# Patient Record
Sex: Male | Born: 1950 | Race: Black or African American | Hispanic: No
Health system: Southern US, Community
[De-identification: ages and names within clinical notes are randomized; demographics above are authoritative.]

## PROBLEM LIST (undated history)

## (undated) DIAGNOSIS — I639 Cerebral infarction, unspecified: Secondary | ICD-10-CM

## (undated) DIAGNOSIS — N179 Acute kidney failure, unspecified: Secondary | ICD-10-CM

## (undated) DIAGNOSIS — I10 Essential (primary) hypertension: Secondary | ICD-10-CM

## (undated) DIAGNOSIS — E785 Hyperlipidemia, unspecified: Secondary | ICD-10-CM

## (undated) DIAGNOSIS — N182 Chronic kidney disease, stage 2 (mild): Secondary | ICD-10-CM

## (undated) DIAGNOSIS — I739 Peripheral vascular disease, unspecified: Secondary | ICD-10-CM

---

## 1998-05-06 ENCOUNTER — Encounter: Admission: RE | Admit: 1998-05-06 | Discharge: 1998-08-04 | Payer: Self-pay | Admitting: Neurosurgery

## 1998-09-09 ENCOUNTER — Encounter: Payer: Self-pay | Admitting: Neurosurgery

## 1998-09-10 ENCOUNTER — Ambulatory Visit (HOSPITAL_COMMUNITY): Admission: RE | Admit: 1998-09-10 | Discharge: 1998-09-10 | Payer: Self-pay | Admitting: Neurosurgery

## 2001-11-05 ENCOUNTER — Encounter: Payer: Self-pay | Admitting: Emergency Medicine

## 2001-11-05 ENCOUNTER — Emergency Department (HOSPITAL_COMMUNITY): Admission: EM | Admit: 2001-11-05 | Discharge: 2001-11-05 | Payer: Self-pay | Admitting: Emergency Medicine

## 2004-09-05 ENCOUNTER — Ambulatory Visit: Payer: Self-pay | Admitting: Nurse Practitioner

## 2004-09-25 ENCOUNTER — Ambulatory Visit: Payer: Self-pay | Admitting: *Deleted

## 2005-09-08 ENCOUNTER — Ambulatory Visit (HOSPITAL_BASED_OUTPATIENT_CLINIC_OR_DEPARTMENT_OTHER): Admission: RE | Admit: 2005-09-08 | Discharge: 2005-09-08 | Payer: Self-pay | Admitting: Internal Medicine

## 2005-09-13 ENCOUNTER — Ambulatory Visit: Payer: Self-pay | Admitting: Internal Medicine

## 2009-04-15 ENCOUNTER — Emergency Department (HOSPITAL_COMMUNITY): Admission: EM | Admit: 2009-04-15 | Discharge: 2009-04-15 | Payer: Self-pay | Admitting: Emergency Medicine

## 2010-04-23 ENCOUNTER — Emergency Department (HOSPITAL_COMMUNITY): Admission: EM | Admit: 2010-04-23 | Discharge: 2010-04-23 | Payer: Self-pay | Admitting: Emergency Medicine

## 2010-04-23 ENCOUNTER — Emergency Department (HOSPITAL_COMMUNITY): Admission: EM | Admit: 2010-04-23 | Discharge: 2010-04-23 | Payer: Self-pay | Admitting: Family Medicine

## 2011-03-10 LAB — POCT I-STAT, CHEM 8
BUN: 30 mg/dL — ABNORMAL HIGH (ref 6–23)
Calcium, Ion: 1.07 mmol/L — ABNORMAL LOW (ref 1.12–1.32)
Chloride: 103 mEq/L (ref 96–112)
Creatinine, Ser: 1.6 mg/dL — ABNORMAL HIGH (ref 0.4–1.5)
Glucose, Bld: 142 mg/dL — ABNORMAL HIGH (ref 70–99)
HCT: 43 % (ref 39.0–52.0)
Hemoglobin: 14.6 g/dL (ref 13.0–17.0)
Potassium: 4.3 mEq/L (ref 3.5–5.1)
Sodium: 138 mEq/L (ref 135–145)
TCO2: 34 mmol/L (ref 0–100)

## 2011-03-10 LAB — POCT URINALYSIS DIP (DEVICE)
Bilirubin Urine: NEGATIVE
Glucose, UA: NEGATIVE mg/dL
Ketones, ur: NEGATIVE mg/dL
Nitrite: POSITIVE — AB
Protein, ur: 100 mg/dL — AB
Specific Gravity, Urine: 1.015 (ref 1.005–1.030)
Urobilinogen, UA: 0.2 mg/dL (ref 0.0–1.0)
pH: 6 (ref 5.0–8.0)

## 2011-05-08 NOTE — Procedures (Signed)
NAME:  CALOB, BASKETTE                ACCOUNT NO.:  [id number]   MEDICAL RECORD NO.:  [id number]          PATIENT TYPE:  OUT   LOCATION:  SLEEP CENTER                 FACILITY:  [facility]   PHYSICIAN:  Clinton D. Maple Hudson, M.D. DATE OF BIRTH:  11/06/1951   DATE OF STUDY:  09/08/2005                              NOCTURNAL POLYSOMNOGRAM   REFERRING PHYSICIAN:  Dr. Fleet Contras.   DATE OF STUDY:  September 08, 2005.   INDICATION FOR STUDY:  Hypersomnia with sleep apnea. Epworth sleepiness  score 14/24, BMI 39. Weight 230 pounds.   SLEEP ARCHITECTURE:  Total sleep time 412 minutes with sleep efficiency 90%.  Stage I was 15%, stage II 37%, stages III and IV 24%, REM 25% of total sleep  time. Sleep latency 4 minutes, REM latency 127 minutes, awake after sleep  onset 44 minutes, arousal index 40. No bedtime medication taken.   RESPIRATORY DATA:  Split study protocol. Apnea/hypopnea index (AHI, RDI)  94.8 obstructive events per hour indicating severe obstructive sleep  apnea/hypopnea syndrome. There were 202 obstructive apneas and 6 hypopneas  before CPAP. Events were not positional. REM AHI 34. The technician titrated  CPAP to 25 CWP, AHI 18 per hour. This represents a complete control even at  that pressure. Somewhat better scores were obtained during short intervals  of time at CPAP 11 to 13 raising the possibility that at higher pressures  the patient was developing nasal congestion on a vasomotor basis to the CPAP  flow. Mask type: ResMed Swift with medium nasal pillows and heated  humidifier.   OXYGEN DATA:  Very loud snoring with oxygen desaturation to a nadir of 50%.  Mean oxygen saturation after CPAP control was 92-95% on room air.   CARDIAC DATA:  Normal sinus rhythm.   MOVEMENT/PARASOMNIA:  Leg jerks were reported with little effect on sleep.   IMPRESSION/RECOMMENDATIONS:  1.  Severe obstructive sleep apnea/hypopnea syndrome, apnea/hypopnea index      94.8 per hour with loud  snoring and oxygen desaturation to 50% on room      air.  2.  Improved scores but with residual events, apnea/hypopnea index 18 per      hour, even at 25 centimeter of water pressure. Note consideration above      that higher pressures might be counterproductive. Consider initial trial      at 13 centimeter of water pressure (AHI 15.4 per hour). If this does not      provide satisfactory control, may need to tried BiPAP to tolerate higher      pressures. If bilevel therapy is needed, consider initial trial with an      inspiratory pressure of 25 and expiratory pressure of 15. Recommend      heated humidifier. Also consider ear, nose, and      throat evaluation for correctable upper airway obstructive conditions or      manage allergies/nasal congestion medically. Weight loss is likely to      help.      Clinton D. Maple Hudson, M.D.  Diplomate, Biomedical engineer of Sleep Medicine  Electronically Signed     CDY/MEDQ  D:  09/13/2005 14:20:38  T:  09/13/2005 23:51:21  Job:  [id number]

## 2014-03-27 ENCOUNTER — Ambulatory Visit (HOSPITAL_BASED_OUTPATIENT_CLINIC_OR_DEPARTMENT_OTHER): Payer: Medicare PPO | Attending: Internal Medicine | Admitting: Radiology

## 2014-03-27 VITALS — Ht 65.0 in | Wt 235.0 lb

## 2014-03-27 DIAGNOSIS — G4733 Obstructive sleep apnea (adult) (pediatric): Secondary | ICD-10-CM | POA: Diagnosis not present

## 2014-03-31 DIAGNOSIS — G4733 Obstructive sleep apnea (adult) (pediatric): Secondary | ICD-10-CM

## 2014-03-31 DIAGNOSIS — G4710 Hypersomnia, unspecified: Secondary | ICD-10-CM

## 2014-03-31 DIAGNOSIS — G4730 Sleep apnea, unspecified: Secondary | ICD-10-CM

## 2014-03-31 NOTE — Sleep Study (Signed)
   NAME: Gregory [name] DATE OF BIRTH:  1951-03-31 MEDICAL RECORD NUMBER [id number]  LOCATION: [facility]  PHYSICIAN: Florencio Hollibaugh D Kaeli Nichelson  DATE OF STUDY: 03/27/2014  SLEEP STUDY TYPE: Nocturnal Polysomnogram               REFERRING PHYSICIAN: Dorrene GermanAvbuere, Edwin A, MD  INDICATION FOR STUDY: Hypersomnia with sleep apnea  EPWORTH SLEEPINESS SCORE:   12/24 HEIGHT: 5\' 5"  (165.1 cm)  WEIGHT: 235 lb (106.595 kg)    Body mass index is 39.11 kg/(m^2).  NECK SIZE: 23 in.  MEDICATIONS: Charted for review  SLEEP ARCHITECTURE: Split study protocol. During the diagnostic phase, total sleep time 134.5 minutes with sleep efficiency 94.1%. Stage I was 7.1%, stage II 71.9%, stage III absent, REM 20.9% of total sleep time. Sleep latency 4 minutes, REM latency 45.5 minutes, awake after sleep onset 4 minutes, arousal index 82.5. Bedtime medication: None.  RESPIRATORY DATA: Apnea hypopnea index (AHI) 96.3 per hour. 203 total events scored including 144 obstructive apneas, 2 central apneas, 43 mixed apneas, 14 hypopneas. Nonsupine events. REM AHI 72.5 per hour. CPAP was titrated to 21 CWP with AHI 0 per hour. He was still snoring so the technician changed to bilevel and titrated to an inspiratory pressure of 25 and expiratory pressure of 21 CWP. The patient was still having significant numbers of events at that level with a residual AHI of 86.7 per hour. He wore a medium ResMed Quattro fracture  face mask with heated humidifier.  OXYGEN DATA: Moderately loud snoring before CPAP with oxygen desaturation to a nadir of 55% on room air. Because of persistent desaturation into the 60% range, the technician added oxygen at 1 L per minute at 1: 11 AM. Mean oxygen saturation during CPAP titration, with supplemental oxygen, was 86.9%.  CARDIAC DATA: Sinus rhythm  MOVEMENT/PARASOMNIA: No significant movement disturbance, bathroom x2  IMPRESSION/ RECOMMENDATION:   1) Severe obstructive sleep  apnea/hypopnea syndrome, AHI 96.3 per hour with nonsupine events. REM AHI 72.5 per hour. Moderately loud snoring with oxygen desaturation to a nadir of 55% on room air.  2) CPAP was titrated to 21 CWP with residual AHI 0 per hour. Because of persistent snoring, the technician changed to bilevel and titrated to an inspiratory pressure of 25 and expiratory pressure of 21 CWP. This was associated with a residual AHI of 86.7 per hour. Recommend initial home trial with CPAP at 20 CWP. The patient wore a medium ResMed Quattro fracture fullface mask with heated humidifier. 3) Because of persistent low oxygen saturations, the technician added 1 L of supplemental oxygen at 1: 11 AM. Recommend evaluation for supplemental oxygen during sleep in the home environment. 4) Consider ENT evaluation for correctable nasal or upper airway obstruction contributing to the severe obstructive sleep apnea. Difficulty controlling apnea events with these therapeutic pressures is unusual.  Signed Jetty Duhamellinton Clairessa Boulet M.D. Waymon Budgelinton D Zarra Geffert Diplomate, Biomedical engineerAmerican Board of Sleep Medicine  ELECTRONICALLY SIGNED ON:  03/31/2014, 4:23 PM [facility] PH: [phone]   FX: [phone] ACCREDITED BY THE AMERICAN ACADEMY OF SLEEP MEDICINE

## 2015-01-18 ENCOUNTER — Telehealth: Payer: Self-pay | Admitting: *Deleted

## 2015-01-18 NOTE — Telephone Encounter (Signed)
error 

## 2018-09-03 ENCOUNTER — Encounter (HOSPITAL_COMMUNITY): Payer: Self-pay | Admitting: Radiology

## 2018-09-03 ENCOUNTER — Emergency Department (HOSPITAL_COMMUNITY): Payer: Medicare HMO

## 2018-09-03 ENCOUNTER — Observation Stay (HOSPITAL_COMMUNITY)
Admission: EM | Admit: 2018-09-03 | Discharge: 2018-09-05 | Disposition: A | Discharge status: Home / Self Care | Payer: Medicare HMO | Attending: Student in an Organized Health Care Education/Training Program | Admitting: Student in an Organized Health Care Education/Training Program

## 2018-09-03 ENCOUNTER — Observation Stay (HOSPITAL_COMMUNITY): Payer: Medicare HMO

## 2018-09-03 DIAGNOSIS — R4701 Aphasia: Secondary | ICD-10-CM | POA: Diagnosis present

## 2018-09-03 DIAGNOSIS — G459 Transient cerebral ischemic attack, unspecified: Secondary | ICD-10-CM | POA: Diagnosis not present

## 2018-09-03 DIAGNOSIS — N179 Acute kidney failure, unspecified: Secondary | ICD-10-CM | POA: Diagnosis not present

## 2018-09-03 DIAGNOSIS — I160 Hypertensive urgency: Secondary | ICD-10-CM

## 2018-09-03 DIAGNOSIS — N189 Chronic kidney disease, unspecified: Secondary | ICD-10-CM | POA: Insufficient documentation

## 2018-09-03 DIAGNOSIS — I6523 Occlusion and stenosis of bilateral carotid arteries: Secondary | ICD-10-CM | POA: Diagnosis present

## 2018-09-03 DIAGNOSIS — I129 Hypertensive chronic kidney disease with stage 1 through stage 4 chronic kidney disease, or unspecified chronic kidney disease: Secondary | ICD-10-CM | POA: Insufficient documentation

## 2018-09-03 DIAGNOSIS — R7303 Prediabetes: Secondary | ICD-10-CM | POA: Diagnosis present

## 2018-09-03 DIAGNOSIS — I10 Essential (primary) hypertension: Secondary | ICD-10-CM | POA: Diagnosis present

## 2018-09-03 DIAGNOSIS — N183 Chronic kidney disease, stage 3 unspecified: Secondary | ICD-10-CM | POA: Diagnosis present

## 2018-09-03 LAB — COMPREHENSIVE METABOLIC PANEL
ALBUMIN: 3.6 g/dL (ref 3.5–5.0)
ALT: 15 U/L (ref 0–44)
AST: 26 U/L (ref 15–41)
Alkaline Phosphatase: 95 U/L (ref 38–126)
Anion gap: 12 (ref 5–15)
BUN: 24 mg/dL — AB (ref 8–23)
CO2: 24 mmol/L (ref 22–32)
CREATININE: 1.63 mg/dL — AB (ref 0.61–1.24)
Calcium: 9.2 mg/dL (ref 8.9–10.3)
Chloride: 100 mmol/L (ref 98–111)
GFR calc Af Amer: 48 mL/min — ABNORMAL LOW (ref >60)
GFR calc non Af Amer: 41 mL/min — ABNORMAL LOW (ref >60)
Glucose, Bld: 131 mg/dL — ABNORMAL HIGH (ref 70–99)
POTASSIUM: 5.2 mmol/L — AB (ref 3.5–5.1)
SODIUM: 136 mmol/L (ref 135–145)
Total Bilirubin: 0.7 mg/dL (ref 0.3–1.2)
Total Protein: 7.6 g/dL (ref 6.5–8.1)

## 2018-09-03 LAB — DIFFERENTIAL
ABS IMMATURE GRANULOCYTES: 0 10*3/uL (ref 0.0–0.1)
BASOS ABS: 0.1 10*3/uL (ref 0.0–0.1)
BASOS PCT: 1 %
EOS ABS: 0.2 10*3/uL (ref 0.0–0.7)
Eosinophils Relative: 2 %
IMMATURE GRANULOCYTES: 0 %
Lymphocytes Relative: 27 %
Lymphs Abs: 2.1 10*3/uL (ref 0.7–4.0)
Monocytes Absolute: 0.6 10*3/uL (ref 0.1–1.0)
Monocytes Relative: 8 %
NEUTROS ABS: 4.8 10*3/uL (ref 1.7–7.7)
Neutrophils Relative %: 62 %

## 2018-09-03 LAB — I-STAT CHEM 8, ED
BUN: 28 mg/dL — AB (ref 8–23)
Calcium, Ion: 1.16 mmol/L (ref 1.15–1.40)
Chloride: 105 mmol/L (ref 98–111)
Creatinine, Ser: 1.7 mg/dL — ABNORMAL HIGH (ref 0.61–1.24)
Glucose, Bld: 126 mg/dL — ABNORMAL HIGH (ref 70–99)
HEMATOCRIT: 47 % (ref 39.0–52.0)
HEMOGLOBIN: 16 g/dL (ref 13.0–17.0)
POTASSIUM: 4.7 mmol/L (ref 3.5–5.1)
SODIUM: 139 mmol/L (ref 135–145)
TCO2: 26 mmol/L (ref 22–32)

## 2018-09-03 LAB — I-STAT TROPONIN, ED: Troponin i, poc: 0.04 ng/mL (ref 0.00–0.08)

## 2018-09-03 LAB — CBC
HCT: 47.7 % (ref 39.0–52.0)
HEMOGLOBIN: 14 g/dL (ref 13.0–17.0)
MCH: 28.6 pg (ref 26.0–34.0)
MCHC: 29.4 g/dL — ABNORMAL LOW (ref 30.0–36.0)
MCV: 97.5 fL (ref 78.0–100.0)
Platelets: 200 10*3/uL (ref 150–400)
RBC: 4.89 MIL/uL (ref 4.22–5.81)
RDW: 13.8 % (ref 11.5–15.5)
WBC: 7.8 10*3/uL (ref 4.0–10.5)

## 2018-09-03 LAB — APTT: APTT: 30 s (ref 24–36)

## 2018-09-03 LAB — PROTIME-INR
INR: 1
Prothrombin Time: 13.1 seconds (ref 11.4–15.2)

## 2018-09-03 LAB — CBG MONITORING, ED: Glucose-Capillary: 99 mg/dL (ref 70–99)

## 2018-09-03 MED ORDER — ASPIRIN 81 MG PO CHEW: 324.0000 mg | CHEWABLE_TABLET | Freq: Once | ORAL | Status: DC

## 2018-09-03 MED ORDER — IOPAMIDOL (ISOVUE-370) INJECTION 76%
INTRAVENOUS | Status: AC
  Filled 2018-09-03: qty 50

## 2018-09-03 MED ORDER — ACETAMINOPHEN 325 MG PO TABS: 650.0000 mg | ORAL_TABLET | ORAL | Status: DC | PRN

## 2018-09-03 MED ORDER — SODIUM CHLORIDE 0.9 % IV SOLN
INTRAVENOUS | Status: DC
  Administered 2018-09-03: 22:00:00 via INTRAVENOUS

## 2018-09-03 MED ORDER — ASPIRIN 81 MG PO CHEW
324.0000 mg | CHEWABLE_TABLET | Freq: Once | ORAL | Status: AC
  Administered 2018-09-03: 324 mg via ORAL
  Filled 2018-09-03: qty 4

## 2018-09-03 MED ORDER — ENOXAPARIN SODIUM 40 MG/0.4ML ~~LOC~~ SOLN
40.0000 mg | SUBCUTANEOUS | Status: DC
  Administered 2018-09-04 – 2018-09-05 (×2): 40 mg via SUBCUTANEOUS
  Filled 2018-09-03 (×2): qty 0.4

## 2018-09-03 MED ORDER — IOPAMIDOL (ISOVUE-370) INJECTION 76%
50.0000 mL | Freq: Once | INTRAVENOUS | Status: AC | PRN
  Administered 2018-09-03: 50 mL via INTRAVENOUS

## 2018-09-03 MED ORDER — ATORVASTATIN CALCIUM 80 MG PO TABS
80.0000 mg | ORAL_TABLET | Freq: Every day | ORAL | Status: DC
  Administered 2018-09-03 – 2018-09-05 (×3): 80 mg via ORAL
  Filled 2018-09-03: qty 1
  Filled 2018-09-03: qty 4
  Filled 2018-09-03: qty 1

## 2018-09-03 MED ORDER — STROKE: EARLY STAGES OF RECOVERY BOOK
Freq: Once | Status: AC
  Administered 2018-09-03: 22:00:00
  Filled 2018-09-03: qty 1

## 2018-09-03 MED ORDER — SENNOSIDES-DOCUSATE SODIUM 8.6-50 MG PO TABS: 1.0000 | ORAL_TABLET | Freq: Every evening | ORAL | Status: DC | PRN

## 2018-09-03 MED ORDER — ACETAMINOPHEN 160 MG/5ML PO SOLN: 650.0000 mg | ORAL | Status: DC | PRN

## 2018-09-03 MED ORDER — ACETAMINOPHEN 650 MG RE SUPP: 650.0000 mg | RECTAL | Status: DC | PRN

## 2018-09-03 MED ORDER — LABETALOL HCL 5 MG/ML IV SOLN: 10.0000 mg | INTRAVENOUS | Status: DC | PRN

## 2018-09-03 NOTE — ED Notes (Signed)
Patient transported to MRI 

## 2018-09-03 NOTE — H&P (Signed)
Date: 09/03/2018               Patient Name:  Gregory [name] MRN: [id number]  DOB: 03/14/1951 Age / Sex: 67 y.o., male   PCP: System, Pcp Not In         Medical Service: Internal Medicine Teaching Service         Attending Physician: Dr. Sandre Kittyaines, Elwin MochaAlexander N, MD    First Contact: Dr. Cleaster CorinSeawell Pager: [phone]  Second Contact: Dr. Crista ElliotHarbrecht Pager: [phone]       After Hours (After 5p/  First Contact Pager: [phone]  weekends / holidays): Second Contact Pager: [phone]   Chief Complaint: Aphasia  History of Present Illness: [name] is a 67yo male with history of HTN, spinal stenosis, Gout, and OSA on home CPAP who presented to ED after experiencing sudden onset dizziness and difficulty speaking while at home with his children about three hours ago. He states he knew the words he wanted to say but was unable to get them out. He states symptoms lasted about 30 minutes before they began to resolve. He and his children state he is now at his baseline. He noted no other focal deficits such as weakness, numbness or tingling. He states this has never happened to him before. Patient denies fall or LOC.  Code stroke was called and CT scan of the head showed no acute finding. tPA was not given due to NIH stroke scale of 1. He denies recent illness, nausea, SOB, chest pain.   [place] [facility] and does not have his medication list. He knows he takes one blood pressure medication and a water pill but does not remember the name. Per neurology he also takes a blood thinner but does not remember the name. He is unsure if he takes medication for gout. His wife is trying to find the medication list at home.    In the ED blood pressure was 205/71, he was afebrile.   Meds:  No outpatient medications have been marked as taking for the 09/03/18 encounter [facility](Hospital Encounter).     Allergies: Allergies as of 09/03/2018  . (Not on File)   History reviewed. No pertinent past medical history.  Family  History: No known pertinent family history  Social History: Patient denies tobacco, alcohol or recreational drug use  Review of Systems: A complete ROS was negative except as per HPI.   Physical Exam: Blood pressure (!) 205/71, pulse 100, temperature 98.2 F (36.8 C), resp. rate 17, SpO2 97 %.  Constitution: NAD, lying supine in bed, pleasant, obese HEENT: [place], AT, no scleral icterus Cardio: regular rate & rhythm, distant heart sounds, grade 2 systolic murmur Respiratory: clear to auscultation  Abdominal: +bs, soft, NTTP, non-distended MSK: +1 pitting edema, strength symmetrical and 5/5 UE & LE, sensations intact Neuro: A&Ox3, CN II - XII intact, negative finger to nose test Skin: c/d/i   EKG: personally reviewed my interpretation is irregular rhythm with tachycardia    CTA 09/03/18 IMPRESSION: Advanced atherosclerosis of the aortic arch with pronounced intimal irregularity. No evidence of aneurysm or dissection however.  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 Detail is somewhat limited secondary to artifact from cervical fusion procedures, but I believe there is narrowing of the lumen to 1 mm or less on each side, which would place the patient at risk of ICA occlusion.  Atherosclerosis in the carotid siphon regions with stenosis estimated at 50% on each side.  No large or medium vessel anterior circulation occlusion identified. There is atherosclerotic irregularity of the medium to small vessels diffusely.  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 Left vertebral artery is a small vessel which does show flow through the cervical region and foramen magnum to the basilar. The basilar artery is a small diseased vessel. Posterior circulation branch vessels do show flow but there is atherosclerotic disease diffusely with medium to small vessel atherosclerosis, particularly severe in the left PCA  territory.   By: Paulina Fusi M.D.   On: 09/03/2018 17:46  CT Head 09/03/18: 1. No acute finding. Mild atrophy and chronic small-vessel change of the white matter. 2. ASPECTS is 10. 3. These results were communicated to Dr. Wilford Corner At 5:09 pmon 9/14/2019by text page via the [facility] messaging system.   By: Paulina Fusi M.D.   On: 09/03/2018 17:10  Assessment & Plan by Problem: Active Problems:   * No active hospital problems. *  Transient Ischemic Attack: Patient had symptoms of aphasia for 30 minutes earlier today which resolved by the time we saw him in the ED. CT of the head with no acute findings, CTA with arthrosclerosis of carotid siphon, right vertebral artery occlusion, and stenosis internal carotid artery bulb bilaterally. Denies cardiac history but exact history and medications are still not known as [facility]. He does believe he takes a blood thinner, water pill, and one blood pressure medication. We are waiting for the family to find medication list and will try to contact the [place].  - neurology consulted, following - permissive hypertension for 24-48h - labetalol for systolic >220 or diastolic > 140.  - MRI w/o contrast pending - Echo, HgbA1c, fasting lipid panel pending  - start high dose statin - PT/OT/Speech consulted - placed on telemetry  AKI or CKD: Creatinine 1.7 but unclear baseline. 1.6 eight years ago. Will monitor for now and repeat in the am due to uncertain medical history.   - am bmp  Hypertension: Unclear which medications patient is on. Currently on permissive HTN but will need medication list   OSA: compliant with home CPAP machine.   - consult for CPAP   Dispo: Admit patient to Observation with expected length of stay less than 2 midnights.  SignedVersie Starks, DO 09/03/2018, 6:38 PM  Pager: [phone]

## 2018-09-03 NOTE — ED Notes (Signed)
Attempted report 

## 2018-09-03 NOTE — Consult Note (Signed)
Neurology Consultation  Reason for Consult: Code stroke Referring Physician: Dr. Melene Plan  CC: Slurred speech  History is obtained from: Patient, girlfriend  HPI: Gregory [name] is a 67 y.o. male past medical history of hypertension, gout, sleep apnea, was in his usual state of health until about 4:30 PM when he had sudden onset of difficulty speaking. His girlfriend said that he was attempting to talk but had his words made no sense. His speech was also slurred.  There was no weakness that he felt but he felt dizzy at the time. He described the dizziness as lightheadedness. He has not had similar episodes in the past. There is been no preceding illnesses sicknesses prior to this presentation Denies any chest pain shortness of breath nausea vomiting. Denies taking any pain medications or sedating medications Denies any drug abuse tobacco abuse or alcohol abuse He gets his care at the [facility] in [place]. He reports he is on a blood thinner but could not tell me the name of the blood thinner or the indication for use. Family member-girlfriend at the bedside also could not tell me what medicines he is on Another family member is on their way with the medications.  LKW: 4:30 PM on 09/03/2018 tpa given?: no, NIH 1 Premorbid modified Rankin scale (mRS): 1 for slight difficulty walking due to spinal stenosis  ROS: ROS was performed and is negative except as noted in the HPI.   History reviewed. No pertinent past medical history. Past medical history Hypertension Gout Sleep apnea Spinal stenosis  No family history on file.  Social History:   has no tobacco, alcohol, and drug history on file. Denies alcohol, illicit drug, tobacco abuse  Medications  Current Facility-Administered Medications:  .  iopamidol (ISOVUE-370) 76 % injection, , , ,  No current outpatient medications on file.    Exam: Current vital signs: SpO2 97% Blood pressure 201/71, heart rate  103 Vital signs in last 24 hours: SpO2:  [97 %] 97 % (09/14 1743) General: Awake alert oriented in no distress H ENT: Normocephalic atraumatic Lungs: Clear to auscultation bilaterally with no wheezing rales Cardiovascular: S1-S2 heard regular rate rhythm with no murmur rub gallop Abdomen: Obese, distended, nontender Extremities: Trace pedal edema bilaterally, warm Neurological exam Awake alert oriented x3 Speech is mildly dysarthric Naming/comprehension/repetition intact Attention and concentration is normal Cranial nerves: Pupils are equal round reactive to light, extraocular movements intact, visual fields are full, face is symmetric, facial sensation is intact bilaterally, auditory acuity is intact, palate elevates midline, tongue midline Motor exam: 5/5 in both upper and lower extremities with no drift and normal tone. Sensory exam: Intact sensation to light touch all over with no extinction Coordination: Intact finger-nose-finger on the left, actionable mild dysmetria on the right but on repeated finger-nose-finger testing no gross ataxia noted Gait testing deferred at this time  NIHSS-1 for dysarthria   Labs I have reviewed labs in epic and the results pertinent to this consultation are: CBC    Component Value Date/Time   WBC 7.8 09/03/2018 1650   RBC 4.89 09/03/2018 1650   HGB 16.0 09/03/2018 1703   HCT 47.0 09/03/2018 1703   PLT 200 09/03/2018 1650   MCV 97.5 09/03/2018 1650   MCH 28.6 09/03/2018 1650   MCHC 29.4 (L) 09/03/2018 1650   RDW 13.8 09/03/2018 1650   LYMPHSABS 2.1 09/03/2018 1650   MONOABS 0.6 09/03/2018 1650   EOSABS 0.2 09/03/2018 1650   BASOSABS 0.1 09/03/2018 1650   CMP  Component Value Date/Time   NA 139 09/03/2018 1703   K 4.7 09/03/2018 1703   CL 105 09/03/2018 1703   GLUCOSE 126 (H) 09/03/2018 1703   BUN 28 (H) 09/03/2018 1703   CREATININE 1.70 (H) 09/03/2018 1703  Imaging I have reviewed the images obtained:  CT-scan of the brain  showed an aspects of 10 with chronic small vessel disease and no acute changes. CTA head and neck shows bilateral ICA stenoses with extensive calcification around the bifurcations but no occlusion.  Assessment:  67 year old man with multiple risk factors including obesity, hypertension, obstructive sleep apnea with a episode of a aphasia and dizziness brought in as an acute code stroke. NIH stroke scale 1 for mild dysarthria His symptoms could be related to hypertensive urgency but could also reflect a left MCA versus posterior circulation stroke or TIA Age and multiple risk factors make admission for work-up prudent at this time.  ABCD 2 score 4.  Impression: Evaluate for acute ischemic stroke versus TIA Evaluate for hypertensive urgency  Recommendations: -Admit to hospitalist or observation -Telemetry monitoring -Allow for permissive hypertension for the first 24-48h - only treat PRN if SBP >220 mmHg. Blood pressures can be gradually normalized to SBP<140 upon discharge. -MRI brain without contrast -Echocardiogram -HgbA1c, fasting lipid panel -Frequent neuro checks -Prophylactic therapy-Antiplatelet med: Aspirin - dose 325mg  PO - PLEASE VERIFY THE HOME MEDS TO CHECK WHAT "BLOOD THINNER" HE IS ON -Atorvastatin 80 mg PO daily -Risk factor modification -PT consult, OT consult, Speech consult -If Afib found on telemetry, will need anticoagulation. Decision pending imaging and stroke team rounding.  Please page stroke NP/PA/MD (listed on AMION)  from 8am-4 pm as this patient will be followed by the stroke team at this point.  -- Milon DikesAshish Perlie Scheuring, MD [facility] Pager: [phone] If 7pm to 7am, please call on call as listed on AMION.

## 2018-09-03 NOTE — ED Notes (Signed)
Pt at MRI. Unable to perform 2000 neuro check. Completed at 2045 when pt returned to room.

## 2018-09-03 NOTE — Progress Notes (Signed)
This note also relates to the following rows which could not be included: SpO2 - Cannot attach notes to unvalidated device data  Patient setup on CPAP and placed on AUTO mode with full face mask as per Home and No O2 bleeding in. Patient will call if any issues arise or further assistance needed.

## 2018-09-03 NOTE — ED Provider Notes (Signed)
[facility] EMERGENCY DEPARTMENT Provider Note   CSN: [id number] Arrival date & time: 09/03/18  1642   An emergency department physician performed an initial assessment on this suspected stroke patient at 68.  History   Chief Complaint No chief complaint on file.   HPI Gregory [name] is a 67 y.o. male.  67 yo M with a chief complaint of difficulty with speech.  The patient had sudden onset dizziness and a feeling like he could not speak.  He was unable to get his words out.  Lasted for about an hour and a half.  Arrived as a code stroke.  Currently the patient denies any symptoms.  Feels that his speech is much better.  Denies any unilateral numbness or weakness.  Denies headache or neck pain.  Denies chest pain shortness of breath.  The history is provided by the patient.  Illness  This is a new problem. The current episode started 1 to 2 hours ago. The problem occurs constantly. The problem has been rapidly improving. Associated symptoms include headaches. Pertinent negatives include no chest pain, no abdominal pain and no shortness of breath. Nothing aggravates the symptoms. Nothing relieves the symptoms. He has tried nothing for the symptoms. The treatment provided no relief.    History reviewed. No pertinent past medical history.  There are no active problems to display for this patient.   History reviewed. No pertinent surgical history.      Home Medications    Prior to Admission medications   Not on File    Family History No family history on file.  Social History Social History   Tobacco Use  . Smoking status: Not on file  Substance Use Topics  . Alcohol use: Not on file  . Drug use: Not on file     Allergies   Patient has no allergy information on record.   Review of Systems Review of Systems  Constitutional: Negative for chills and fever.  HENT: Negative for congestion and facial swelling.   Eyes: Negative for discharge and  visual disturbance.  Respiratory: Negative for shortness of breath.   Cardiovascular: Negative for chest pain and palpitations.  Gastrointestinal: Negative for abdominal pain, diarrhea and vomiting.  Musculoskeletal: Negative for arthralgias and myalgias.  Skin: Negative for color change and rash.  Neurological: Positive for dizziness, speech difficulty and headaches. Negative for tremors and syncope.  Psychiatric/Behavioral: Negative for confusion and dysphoric mood.     Physical Exam Updated Vital Signs BP (!) 205/71   Pulse 100   Temp 98.2 F (36.8 C)   Resp 17   SpO2 97%   Physical Exam  Constitutional: He is oriented to person, place, and time. He appears well-developed and well-nourished.  HENT:  Head: Normocephalic and atraumatic.  Eyes: Pupils are equal, round, and reactive to light. EOM are normal.  Neck: Normal range of motion. Neck supple. No JVD present.  Cardiovascular: Normal rate and regular rhythm. Exam reveals no gallop and no friction rub.  No murmur heard. Pulmonary/Chest: No respiratory distress. He has no wheezes.  Abdominal: He exhibits no distension. There is no rebound and no guarding.  Musculoskeletal: Normal range of motion.  Neurological: He is alert and oriented to person, place, and time.  Grossly neurologically intact  Skin: No rash noted. No pallor.  Psychiatric: He has a normal mood and affect. His behavior is normal.  Nursing note and vitals reviewed.    ED Treatments / Results  Labs (all labs ordered are  listed, but only abnormal results are displayed) Labs Reviewed  CBC - Abnormal; Notable for the following components:      Result Value   MCHC 29.4 (*)    All other components within normal limits  COMPREHENSIVE METABOLIC PANEL - Abnormal; Notable for the following components:   Potassium 5.2 (*)    Glucose, Bld 131 (*)    BUN 24 (*)    Creatinine, Ser 1.63 (*)    GFR calc non Af Amer 41 (*)    GFR calc Af Amer 48 (*)    All  other components within normal limits  I-STAT CHEM 8, ED - Abnormal; Notable for the following components:   BUN 28 (*)    Creatinine, Ser 1.70 (*)    Glucose, Bld 126 (*)    All other components within normal limits  PROTIME-INR  APTT  DIFFERENTIAL  I-STAT TROPONIN, ED  CBG MONITORING, ED    EKG None  Radiology Ct Angio Head W Or Wo Contrast  Result Date: 09/03/2018 CLINICAL DATA:  Acute presentation with aphasia and dizziness. No acute finding by head CT. EXAM: CT ANGIOGRAPHY HEAD AND NECK TECHNIQUE: Multidetector CT imaging of the head and neck was performed using the standard protocol during bolus administration of intravenous contrast. Multiplanar CT image reconstructions and MIPs were obtained to evaluate the vascular anatomy. Carotid stenosis measurements (when applicable) are obtained utilizing NASCET criteria, using the distal internal carotid diameter as the denominator. CONTRAST:  50mL ISOVUE-370 IOPAMIDOL (ISOVUE-370) INJECTION 76% COMPARISON:  Head CT earlier same day FINDINGS: CTA NECK FINDINGS Aortic arch: Aortic atherosclerosis. No aneurysm or dissection. Pronounced atherosclerotic irregularity at the arch. Branching pattern is normal without origin stenosis. Common origin of the innominate artery and left common carotid artery. Right carotid system: Common carotid artery widely patent to the bifurcation. There is atherosclerotic disease at the carotid bifurcation. Detail is limited because of artifact from cervical fusion hardware. There appears to be severe stenosis at the distal bulb with near occlusion. Flow is present beyond that however. Left carotid system: Common carotid artery is widely patent to the bifurcation. There is atherosclerotic plaque at the carotid bifurcation and ICA bulb. There is severe stenosis at the distal bulb with luminal diameter of 1 mm or less, near occlusion. Flow is present distal to that. Vertebral arteries: The right vertebral artery is occluded  at its origin. There is atherosclerotic plaque at the left vertebral artery origin with stenosis. Left vertebral artery is a small vessel which does show antegrade flow to the foramen magnum. Skeleton: Previous cervical fusion from C3 through C6 with strut graft, anterior plate and screws. Degenerative spondylosis C6-7. Other neck: No mass or lymphadenopathy. Upper chest: Upper chest is clear. Review of the MIP images confirms the above findings CTA HEAD FINDINGS Anterior circulation: Both internal carotid arteries are patent through the skull base. There is extensive calcification in the carotid siphon regions with stenosis estimated at 50% on each side. There is flow within the anterior and middle cerebral vessels. Medium to small vessels show diffuse atherosclerotic irregularity. I do not see any emergent large or medium vessel occlusion. Posterior circulation: No antegrade flow in the right vertebral artery. The left vertebral artery shows diffuse atherosclerotic change beyond the foramen magnum but is patent to the basilar. The basilar is a small and somewhat irregular vessel but does show flow. There is a large patent posterior communicating artery on each side. Flow is present within both posterior cerebral arteries. More distal branch vessels  do show atherosclerotic irregularity. This is particularly pronounced in the left PCA branches. Venous sinuses: Patent and normal. Anatomic variants: None significant. Delayed phase: No abnormal enhancement. Review of the MIP images confirms the above findings IMPRESSION: Advanced atherosclerosis of the aortic arch with pronounced intimal irregularity. No evidence of aneurysm or dissection however. 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 Detail is somewhat limited secondary to artifact from cervical fusion procedures, but I believe there is narrowing of the lumen to 1 mm or less on each side, which would place the patient at risk of ICA  occlusion. Atherosclerosis in the carotid siphon regions with stenosis estimated at 50% on each side. No large or medium vessel anterior circulation occlusion identified. There is atherosclerotic irregularity of the medium to small vessels diffusely. 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 Left vertebral artery is a small vessel which does show flow through the cervical region and foramen magnum to the basilar. The basilar artery is a small diseased vessel. Posterior circulation branch vessels do show flow but there is atherosclerotic disease diffusely with medium to small vessel atherosclerosis, particularly severe in the left PCA territory. Electronically Signed   By: Paulina Fusi M.D.   On: 09/03/2018 17:46   Ct Angio Neck W Or Wo Contrast  Result Date: 09/03/2018 CLINICAL DATA:  Acute presentation with aphasia and dizziness. No acute finding by head CT. EXAM: CT ANGIOGRAPHY HEAD AND NECK TECHNIQUE: Multidetector CT imaging of the head and neck was performed using the standard protocol during bolus administration of intravenous contrast. Multiplanar CT image reconstructions and MIPs were obtained to evaluate the vascular anatomy. Carotid stenosis measurements (when applicable) are obtained utilizing NASCET criteria, using the distal internal carotid diameter as the denominator. CONTRAST:  50mL ISOVUE-370 IOPAMIDOL (ISOVUE-370) INJECTION 76% COMPARISON:  Head CT earlier same day FINDINGS: CTA NECK FINDINGS Aortic arch: Aortic atherosclerosis. No aneurysm or dissection. Pronounced atherosclerotic irregularity at the arch. Branching pattern is normal without origin stenosis. Common origin of the innominate artery and left common carotid artery. Right carotid system: Common carotid artery widely patent to the bifurcation. There is atherosclerotic disease at the carotid bifurcation. Detail is limited because of artifact from cervical fusion hardware. There appears to  be severe stenosis at the distal bulb with near occlusion. Flow is present beyond that however. Left carotid system: Common carotid artery is widely patent to the bifurcation. There is atherosclerotic plaque at the carotid bifurcation and ICA bulb. There is severe stenosis at the distal bulb with luminal diameter of 1 mm or less, near occlusion. Flow is present distal to that. Vertebral arteries: The right vertebral artery is occluded at its origin. There is atherosclerotic plaque at the left vertebral artery origin with stenosis. Left vertebral artery is a small vessel which does show antegrade flow to the foramen magnum. Skeleton: Previous cervical fusion from C3 through C6 with strut graft, anterior plate and screws. Degenerative spondylosis C6-7. Other neck: No mass or lymphadenopathy. Upper chest: Upper chest is clear. Review of the MIP images confirms the above findings CTA HEAD FINDINGS Anterior circulation: Both internal carotid arteries are patent through the skull base. There is extensive calcification in the carotid siphon regions with stenosis estimated at 50% on each side. There is flow within the anterior and middle cerebral vessels. Medium to small vessels show diffuse atherosclerotic irregularity. I do not see any emergent large or medium vessel occlusion. Posterior circulation: No antegrade flow in  the right vertebral artery. The left vertebral artery shows diffuse atherosclerotic change beyond the foramen magnum but is patent to the basilar. The basilar is a small and somewhat irregular vessel but does show flow. There is a large patent posterior communicating artery on each side. Flow is present within both posterior cerebral arteries. More distal branch vessels do show atherosclerotic irregularity. This is particularly pronounced in the left PCA branches. Venous sinuses: Patent and normal. Anatomic variants: None significant. Delayed phase: No abnormal enhancement. Review of the MIP images  confirms the above findings IMPRESSION: Advanced atherosclerosis of the aortic arch with pronounced intimal irregularity. No evidence of aneurysm or dissection however. 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 Detail is somewhat limited secondary to artifact from cervical fusion procedures, but I believe there is narrowing of the lumen to 1 mm or less on each side, which would place the patient at risk of ICA occlusion. Atherosclerosis in the carotid siphon regions with stenosis estimated at 50% on each side. No large or medium vessel anterior circulation occlusion identified. There is atherosclerotic irregularity of the medium to small vessels diffusely. 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 Left vertebral artery is a small vessel which does show flow through the cervical region and foramen magnum to the basilar. The basilar artery is a small diseased vessel. Posterior circulation branch vessels do show flow but there is atherosclerotic disease diffusely with medium to small vessel atherosclerosis, particularly severe in the left PCA territory. Electronically Signed   By: Paulina Fusi M.D.   On: 09/03/2018 17:46   Ct Head Code Stroke Wo Contrast  Result Date: 09/03/2018 CLINICAL DATA:  Code stroke. Aphasia and dizziness. Last seen normal 1600 hours EXAM: CT HEAD WITHOUT CONTRAST TECHNIQUE: Contiguous axial images were obtained from the base of the skull through the vertex without intravenous contrast. COMPARISON:  None. FINDINGS: Brain: Mild generalized atrophy. Mild small vessel change of the white matter. No sign of acute infarction, mass lesion, hemorrhage, hydrocephalus or extra-axial collection. Likely insignificant dural calcification in the left frontal convexity region. Vascular: There is atherosclerotic calcification of the major vessels at the base of the brain. Skull: Negative Sinuses/Orbits: Clear/normal Other: None  ASPECTS (Alberta Stroke Program Early CT Score) - Ganglionic level infarction (caudate, lentiform nuclei, internal capsule, insula, M1-M3 cortex): 7 - Supraganglionic infarction (M4-M6 cortex): 3 Total score (0-10 with 10 being normal): 10 IMPRESSION: 1. No acute finding. Mild atrophy and chronic small-vessel change of the white matter. 2. ASPECTS is 10. 3. These results were communicated to Dr. Wilford Corner At 5:09 pmon 9/14/2019by text page via the [facility] messaging system. Electronically Signed   By: Paulina Fusi M.D.   On: 09/03/2018 17:10    Procedures Procedures (including critical care time)  Medications Ordered in ED Medications  iopamidol (ISOVUE-370) 76 % injection (has no administration in time range)  iopamidol (ISOVUE-370) 76 % injection 50 mL (50 mLs Intravenous Contrast Given 09/03/18 1720)     Initial Impression / Assessment and Plan / ED Course  I have reviewed the triage vital signs and the nursing notes.  Pertinent labs & imaging results that were available during my care of the patient were reviewed by me and considered in my medical decision making (see chart for details).     67 yo M with a chief complaint of aphasia.  This lasted for about an hour and a half and then slowly improved.  He arrived  as a code stroke.  TPA was not given due to improvement of his symptoms and an NIH score of 1.  I discussed the case with the neurologist, Dr. Jerrell Belfast he recommended placement into the hospital for a TIA observation.  The patient is somewhat hypertensive and so he was concerned that there may be a possibility that the patient is presenting with hypertensive emergency.  At this point though he recommends not treating the blood pressure unless it rose over 220 systolic.  Discussed with the internal medicine residency service will admit.  The patients results and plan were reviewed and discussed.   Any x-rays performed were independently reviewed by myself.   Differential diagnosis were  considered with the presenting HPI.  Medications  iopamidol (ISOVUE-370) 76 % injection (has no administration in time range)  iopamidol (ISOVUE-370) 76 % injection 50 mL (50 mLs Intravenous Contrast Given 09/03/18 1720)    Vitals:   09/03/18 1743 09/03/18 1745 09/03/18 1755  BP:  (!) 205/71   Pulse:  100   Resp:  17   Temp:   98.2 F (36.8 C)  SpO2: 97% 97%     Final diagnoses:  Aphasia    Admission/ observation were discussed with the admitting physician, patient and/or family and they are comfortable with the plan.    Final Clinical Impressions(s) / ED Diagnoses   Final diagnoses:  Aphasia    ED Discharge Orders    None       Melene Plan, DO 09/03/18 1816

## 2018-09-03 NOTE — Progress Notes (Signed)
Arrived from Ed. Alert and oriented. Denies any pain. Walk from stretcher to bed with cane. Call light within reach.

## 2018-09-04 ENCOUNTER — Observation Stay (HOSPITAL_BASED_OUTPATIENT_CLINIC_OR_DEPARTMENT_OTHER): Payer: Medicare HMO

## 2018-09-04 DIAGNOSIS — G459 Transient cerebral ischemic attack, unspecified: Secondary | ICD-10-CM | POA: Diagnosis present

## 2018-09-04 DIAGNOSIS — I361 Nonrheumatic tricuspid (valve) insufficiency: Secondary | ICD-10-CM

## 2018-09-04 LAB — HIV ANTIBODY (ROUTINE TESTING W REFLEX): HIV Screen 4th Generation wRfx: NONREACTIVE

## 2018-09-04 LAB — ECHOCARDIOGRAM COMPLETE
Height: 64 in
Weight: 3767.2205 oz

## 2018-09-04 LAB — LIPID PANEL
Cholesterol: 147 mg/dL (ref 0–200)
HDL: 29 mg/dL — ABNORMAL LOW (ref >40)
LDL Cholesterol: 95 mg/dL (ref 0–99)
Total CHOL/HDL Ratio: 5.1 RATIO
Triglycerides: 114 mg/dL (ref <150)
VLDL: 23 mg/dL (ref 0–40)

## 2018-09-04 LAB — BASIC METABOLIC PANEL
Anion gap: 11 (ref 5–15)
BUN: 23 mg/dL (ref 8–23)
CO2: 25 mmol/L (ref 22–32)
Calcium: 9.2 mg/dL (ref 8.9–10.3)
Chloride: 103 mmol/L (ref 98–111)
Creatinine, Ser: 1.59 mg/dL — ABNORMAL HIGH (ref 0.61–1.24)
GFR calc Af Amer: 49 mL/min — ABNORMAL LOW (ref >60)
GFR calc non Af Amer: 43 mL/min — ABNORMAL LOW (ref >60)
Glucose, Bld: 104 mg/dL — ABNORMAL HIGH (ref 70–99)
Potassium: 5.3 mmol/L — ABNORMAL HIGH (ref 3.5–5.1)
Sodium: 139 mmol/L (ref 135–145)

## 2018-09-04 MED ORDER — ASPIRIN EC 81 MG PO TBEC
81.0000 mg | DELAYED_RELEASE_TABLET | Freq: Every day | ORAL | Status: DC
  Administered 2018-09-04 – 2018-09-05 (×2): 81 mg via ORAL
  Filled 2018-09-04 (×2): qty 1

## 2018-09-04 MED ORDER — ALLOPURINOL 100 MG PO TABS
300.0000 mg | ORAL_TABLET | Freq: Every day | ORAL | Status: DC
  Administered 2018-09-04 – 2018-09-05 (×2): 300 mg via ORAL
  Filled 2018-09-04 (×2): qty 3

## 2018-09-04 MED ORDER — CLOPIDOGREL BISULFATE 75 MG PO TABS
75.0000 mg | ORAL_TABLET | Freq: Every day | ORAL | Status: DC
  Administered 2018-09-04 – 2018-09-05 (×2): 75 mg via ORAL
  Filled 2018-09-04 (×2): qty 1

## 2018-09-04 NOTE — Progress Notes (Signed)
  Echocardiogram 2D Echocardiogram has been performed.  Gregory [name], Gregory [name] 09/04/2018, 10:16 AM

## 2018-09-04 NOTE — Progress Notes (Signed)
Pt has refused cpap at this time. RT removed machine due to pt stating he will not use because he cannot sleep.  RT will monitor.

## 2018-09-04 NOTE — Evaluation (Signed)
Physical Therapy Evaluation Patient Details Name: Gregory HazyDonald Lee [name] MRN: [id number] DOB: 1951-08-07 Today's Date: 09/04/2018   History of Present Illness  67 yo M with a chief complaint of aphasia.  This lasted for about an hour and a half and then slowly improved.  He arrived as a code stroke. Neuro work up underway.  Clinical Impression  Orders received for PT evaluation. Patient demonstrates some deficits in functional mobility and activity tolerance but reports this is baseline. Overall patient is mobilizing without assist. States he does have a scooter at home for mobility in addition to his other DME. At this time, no further acute PT needs. Will sign off.   OF NOTE: HR elevations with mobility to upper 140s.       Follow Up Recommendations No PT follow up;Supervision for mobility/OOB    Equipment Recommendations  None recommended by PT    Recommendations for Other Services       Precautions / Restrictions Precautions Precautions: Fall Precaution Comments: wears O2 at night      Mobility  Bed Mobility Overal bed mobility: Modified Independent Bed Mobility: Supine to Sit           General bed mobility comments: increased time to perform, no physical assist required at this time  Transfers Overall transfer level: Modified independent               General transfer comment: no physical assist required, use of cane during power up to standing. Modest instability but reports that is baseline due to his back pain  Ambulation/Gait Ambulation/Gait assistance: Supervision Gait Distance (Feet): 160 Feet Assistive device: Straight cane Gait Pattern/deviations: Step-through pattern;Decreased stride length;Trunk flexed Gait velocity: decreased   General Gait Details: reliance on SPC for stability during ambulation, no physical assist. Fatigues quickly with HR elevations to upper 140s  Stairs Stairs: Yes Stairs assistance: Supervision Stair Management: Two  rails Number of Stairs: 4 General stair comments: no physical assist required, supervision for safety  Wheelchair Mobility    Modified Rankin (Stroke Patients Only) Modified Rankin (Stroke Patients Only) Pre-Morbid Rankin Score: Moderate disability Modified Rankin: Moderate disability     Balance Overall balance assessment: Mild deficits observed, not formally tested                                           Pertinent Vitals/Pain Pain Assessment: Faces Faces Pain Scale: Hurts little more Pain Location: back Pain Descriptors / Indicators: Cramping Pain Intervention(s): Monitored during session    Home Living Family/patient expects to be discharged to:: Private residence Living Arrangements: Spouse/significant other;Children Available Help at Discharge: Family Type of Home: House Home Access: Stairs to enter Entrance Stairs-Rails: None Entrance Stairs-Number of Steps: 2 Home Layout: One level Home Equipment: Cane - single point;Bedside commode;Shower seat;Toilet riser;Grab bars - tub/shower;Electric scooter      Prior Function Level of Independence: Needs assistance   Gait / Transfers Assistance Needed: independent with cane fot short distances and scooter for longer mobility     Comments: family assists at home PRN     Hand Dominance   Dominant Hand: Right    Extremity/Trunk Assessment   Upper Extremity Assessment Upper Extremity Assessment: Overall WFL for tasks assessed    Lower Extremity Assessment Lower Extremity Assessment: Overall WFL for tasks assessed    Cervical / Trunk Assessment Cervical / Trunk Assessment: (history of back problems)  Communication      Cognition Arousal/Alertness: Awake/alert Behavior During Therapy: WFL for tasks assessed/performed Overall Cognitive Status: Within Functional Limits for tasks assessed                                        General Comments      Exercises      Assessment/Plan    PT Assessment Patent does not need any further PT services  PT Problem List         PT Treatment Interventions      PT Goals (Current goals can be found in the Care Plan section)  Acute Rehab PT Goals PT Goal Formulation: All assessment and education complete, DC therapy    Frequency     Barriers to discharge        Co-evaluation               AM-PAC PT "6 Clicks" Daily Activity  Outcome Measure Difficulty turning over in bed (including adjusting bedclothes, sheets and blankets)?: A Little Difficulty moving from lying on back to sitting on the side of the bed? : A Little Difficulty sitting down on and standing up from a chair with arms (e.g., wheelchair, bedside commode, etc,.)?: A Little Help needed moving to and from a bed to chair (including a wheelchair)?: A Little Help needed walking in hospital room?: A Little Help needed climbing 3-5 steps with a railing? : A Little 6 Click Score: 18    End of Session   Activity Tolerance: Patient tolerated treatment well Patient left: in chair;Other (comment)(with OT present in gym) Nurse Communication: Mobility status PT Visit Diagnosis: Other symptoms and signs involving the nervous system (R29.898)    Time: 1610-9604 PT Time Calculation (min) (ACUTE ONLY): 19 min   Charges:   PT Evaluation $PT Eval Moderate Complexity: 1 Mod          Charlotte Crumb, PT DPT  Board Certified Neurologic Specialist Acute Rehabilitation Services Pager [phone] Office [phone]   Fabio Asa 09/04/2018, 11:21 AM

## 2018-09-04 NOTE — Progress Notes (Signed)
  Date: 09/04/2018  Patient name: Gregory [name]  Medical record number: [id number]  Date of birth: [birth year]   I have seen and evaluated this patient and I have discussed the plan of care with the house staff. Please see their note for complete details. I concur with their findings with the following additions/corrections:   67 year old man with a history of HTN and gout who presented with a 30 minutes of sudden onset dizziness and difficulty speaking.  He had returned to normal by the time he was evaluated in the ED.  Today, he continues to feel back to normal.  Neurologic exam was normal.  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  CT angios showed extensive vascular disease in multiple vessels but no acute occlusions.  Awaiting TTE results.  This likely was a TIA, although hypertensive emergency and seizure are on the differential as well.  We have consulted neurology, allowed permissive hypertension, and initiated the TIA work-up.  As he is back to baseline, he likely will be able to be discharged later today once we have completed his testing and determined the best medication regimen for him to be discharged on to decrease his risk of future stroke.  Jessy OtoAlexander Raines, M.D., Ph.D. 09/04/2018, 4:24 PM

## 2018-09-04 NOTE — Discharge Summary (Addendum)
Name: Gregory [name] MRN: [id number] DOB: December 29, 1950 67 y.o. PCP: System, Pcp Not In  Date of Admission: 09/03/2018  4:45 PM Date of Discharge: 09/05/2018 Attending Physician: Erlinda Hong, MD  Discharge Diagnosis: 1. TIA  Discharge Medications: Allergies as of 09/05/2018   No Known Allergies     Medication List    STOP taking these medications   indomethacin 50 MG capsule Commonly known as:  INDOCIN   simvastatin 40 MG tablet Commonly known as:  ZOCOR     TAKE these medications   allopurinol 300 MG tablet Commonly known as:  ZYLOPRIM Take 300 mg by mouth daily.   aspirin 81 MG EC tablet Take 1 tablet (81 mg total) by mouth daily.   atorvastatin 80 MG tablet Commonly known as:  LIPITOR Take 1 tablet (80 mg total) by mouth daily at 6 PM.   clopidogrel 75 MG tablet Commonly known as:  PLAVIX Take 1 tablet (75 mg total) by mouth daily.   diclofenac 75 MG EC tablet Commonly known as:  VOLTAREN Take 75 mg by mouth at bedtime as needed for mild pain or moderate pain.   hydrochlorothiazide 50 MG tablet Commonly known as:  HYDRODIURIL Take 50 mg by mouth daily.   lisinopril 40 MG tablet Commonly known as:  PRINIVIL,ZESTRIL Take 40 mg by mouth daily.       Disposition and follow-up:   Gregory [name] was discharged from [facility] in Good condition.  At the hospital follow up visit please address:  1. TIA: started on aspirin 81mg  and plavix 75mg  QD and increased to high intensity stati with lipitor 80mg  QD. CTA showed extensive ICA calcification and stenosis. Vascular surgery recommends carotid angiography, which is scheduled for 9/20.   Gout - discontinued home indomethacin as patient was taking double NSAID therapy for gout. He experienced no pain or gouty symptoms while off of the NSAIDs during his hospitalization.  2.  Labs / imaging needed at time of follow-up: Carotid angiography   3.  Pending labs/ test needing follow-up: Hb  A1c  Follow-up Appointments: Follow-up Information    [facility]. Call in 6 week(s).   Specialty:  Neurology Why:  Call and schedule to see [facility] as soon as your leave the hospital to schedule an appointment with them in 4-6 weeks Contact information: [address] [place] [address] [phone]       [facility] Follow up.   Contact information: [address] [place] [address] [phone]       Vascular Surgery, Physician, MD Follow up.   Specialty:  Vascular Surgery Why:  Call to determine the time of this appointment. You have been scheduled for 9/20.  They will obtain aortoiliac and lower extremity duplex in their office that day. Contact information: [address] [place] [address] [phone]           Hospital Course by problem list: 1. TIA: Patient presented with dizziness and aphasia that lasted 30 minutes and resolved completely. His neuro exam at presentation was normal. During his hospitalization, he had no further episodes of dizziness or aphasia. CT of the head with no acute findings. 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 MRI brain showed old infarct, but no acute processes.  TTE showed EF 55%, grade 1 diastolic dysfunction, moderate to severe aortic stenosis, and no PFO. Lipid panel remarkable only for low LDL at 29. A1c drawn, but sent out due to lab equipment malfunction, and did not result during hospitalization. Patient was started on DAPT with aspirin 81mg  QD and plavix 75mg  QD, and his statin therapy was increased to high intensity 80mg  lipitor QD. Because CTA and carotid dopplers were conflicting, vascular surgery recommended  carotid angiography which will be performed in the outpatient setting on 9/20.  2. AKI or CKD: Creatinine 1.7 on admission and trended down to 1.59. Baseline unclear, but Cr was 1.6 eight years ago. Most likely a chronic process rather than acute. Patient's med list included both diclofenac 75 mg PRN and indomethacin 50mg  BID, which he believes are for gout, as well as lisinopril. Held NSAIDs during hospitalization and discontinued indomethacin on discharge.  3. Hypertension:Patient initially hypertensive in the 200s systolic. Bps improved to 160s systolic without intervention. HTN was permitted during hospitalization in the setting of stroke. Resumed home medications of hctz and lisinopril upon discharge.  Discharge Vitals:   BP (!) 162/72 (BP Location: Right Arm)   Pulse 86   Temp 98.9 F (37.2 C) (Oral)   Resp 18   Ht 5\' 4"  (1.626 m)   Wt 106.8 kg   SpO2 98%   BMI 40.42 kg/m   Pertinent Labs, Studies, and Procedures:   CBC Latest Ref Rng & Units 09/03/2018 09/03/2018 04/23/2010  WBC 4.0 - 10.5 K/uL - 7.8 -  Hemoglobin 13.0 - 17.0 g/dL 91.416.0 78.214.0 95.614.6  Hematocrit 39.0 - 52.0 % 47.0 47.7 43.0  Platelets 150 - 400 K/uL - 200 -   CMP Latest Ref Rng & Units 09/04/2018 09/03/2018 09/03/2018  Glucose 70 - 99 mg/dL 213(Y104(H) 865(H126(H) 846(N131(H)  BUN 8 - 23 mg/dL 23 62(X28(H) 52(W24(H)  Creatinine 0.61 - 1.24 mg/dL 4.13(K1.59(H) 4.40(N1.70(H) 0.27(O1.63(H)  Sodium 135 - 145 mmol/L 139 139 136  Potassium 3.5 - 5.1 mmol/L 5.3(H) 4.7 5.2(H)  Chloride 98 - 111 mmol/L 103 105 100  CO2 22 - 32 mmol/L 25 - 24  Calcium 8.9 - 10.3 mg/dL 9.2 - 9.2  Total Protein 6.5 - 8.1 g/dL - - 7.6  Total Bilirubin 0.3 - 1.2 mg/dL - - 0.7  Alkaline Phos 38 - 126 U/L - - 95  AST 15 - 41 U/L - - 26  ALT 0 - 44 U/L - - 15   Lipid Panel     Component Value Date/Time   CHOL 147 09/04/2018 0638   TRIG 114 09/04/2018 0638   HDL 29 (L) 09/04/2018 0638   CHOLHDL 5.1 09/04/2018 0638   VLDL 23 09/04/2018 0638   LDLCALC 95 09/04/2018 0638   CT  Head 1. No acute finding. Mild atrophy and chronic small-vessel change of the white matter. 2. ASPECTS is 10.  CTA Head and Neck Advanced atherosclerosis of the aortic arch with pronounced intimal irregularity. No evidence of aneurysm or dissection however.  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 Detail is somewhat limited secondary to artifact from cervical fusion procedures, but I believe there is narrowing of the lumen to 1 mm or less on each side, which would place the patient at risk of ICA occlusion.  Atherosclerosis in the carotid siphon regions with stenosis estimated at 50% on each side. No large or medium vessel anterior circulation occlusion identified. There is atherosclerotic irregularity of the medium to small vessels diffusely.  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 Left vertebral artery is a small vessel which does show flow through the cervical region and foramen magnum to the basilar. The basilar artery is a small diseased vessel. Posterior circulation branch vessels do show flow but there is atherosclerotic disease diffusely with medium to small vessel atherosclerosis, particularly severe in the left PCA territory.  MRI Brain 1. No acute intracranial process. 2. 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 3. Otherwise negative noncontrast MRI head for age.  TTE - Left ventricle: The cavity size was normal. Wall thickness was   increased in a pattern of mild LVH. The estimated ejection   fraction was 55%. Although no diagnostic regional wall motion   abnormality was identified, this possibility cannot be completely   excluded on the basis of this study. Doppler parameters are   consistent with abnormal left ventricular relaxation (grade 1   diastolic dysfunction). - Aortic valve: Mildly calcified annulus. Functionally bicuspid;   moderately calcified leaflets. There  was probable moderate to   severe stenosis with relatively low gradients. Mean gradient (S):   11 mm Hg. Peak gradient (S): 20 mm Hg. VTI ratio of LVOT to   aortic valve: 0.41. Valve area (VTI): 1.05 cm^2. Valve area 1.12   cm2 by planimetry. - Mitral valve: Mildly calcified annulus. There was mild   regurgitation. - Right ventricle: The cavity size was mildly dilated. Systolic   function was mildly reduced. - Atrial septum: No defect or patent foramen ovale was identified. - Tricuspid valve: There was mild regurgitation. - Pulmonary arteries: PA peak pressure: 22 mm Hg (S). - Pericardium, extracardiac: There was no pericardial effusion.  Carotid Doppler Right Carotid: Velocities in the right ICA are consistent with a 1-39% stenosis. Left Carotid: There is no evidence of stenosis in the left ICA.  Discharge Instructions: Discharge Instructions    Call MD for:  persistant dizziness or light-headedness   Complete by:  As directed    Diet - low sodium heart healthy   Complete by:  As directed    Discharge instructions   Complete by:  As directed    Please follow-up for your imaging study on 09/09/2018.   With regard to your medications, you will need to continue taking the Plavix and aspirin. This will need to be continued until the carotid artery blockage is addressed by the vascular surgeon. Please do not stop these until they or your primary doctor advise it.   Thank you for your visit to the [facility].   Increase activity slowly   Complete by:  As directed       Signed: Jahliyah Trice, Cathleen Corti, MD 09/07/2018, 1:38 PM   Pager: [phone]

## 2018-09-04 NOTE — Progress Notes (Signed)
SLP Cancellation Note  Patient Details Name: Gregory [name] MRN: [id number] DOB: 09/25/1951   Cancelled treatment:       Reason Eval/Treat Not Completed: SLP screened, no needs identified, will sign off; pt arrived with expressive aphasia primary concern; all symptoms have resolved per pt; MRI/CT head negative   Tressie StalkerPat Adams, M.S., CCC-SLP 09/04/2018, 12:25 PM

## 2018-09-04 NOTE — Progress Notes (Addendum)
Subjective: No acute overnight events. Gregory [name] reports that he feels back to normal and has had no further episodes of dizziness or difficulty speaking. He was able to contact the [place] and provide a list of home medications. He used the CPAP last night. He would like to be discharged today.   Objective:  Vital signs in last 24 hours: Vitals:   09/04/18 0230 09/04/18 0420 09/04/18 0530 09/04/18 0702  BP: (!) 144/81 (!) 163/99 (!) 159/103 (!) 164/108  Pulse: 90  92 96  Resp: 14 13 12 17   Temp: 97.9 F (36.6 C) 98.1 F (36.7 C) 98.1 F (36.7 C)   TempSrc: Axillary Axillary Axillary   SpO2: 97% 97% 94% 99%  Weight:      Height:       Physical Exam  Constitutional: He is oriented to person, place, and time. He appears well-developed and well-nourished. No distress.  HENT:  Head: Normocephalic and atraumatic.  Eyes: Pupils are equal, round, and reactive to light. EOM are normal.  Cardiovascular: Normal rate and regular rhythm. Exam reveals no gallop and no friction rub.  Faint systolic murmur.  Pulmonary/Chest: Effort normal and breath sounds normal. He has no wheezes. He has no rales.  Abdominal: Soft. Bowel sounds are normal. He exhibits no distension. There is no tenderness.  Musculoskeletal: Normal range of motion. He exhibits no edema or deformity.  Neurological: He is alert and oriented to person, place, and time. No cranial nerve deficit or sensory deficit. He exhibits normal muscle tone.  Skin: Skin is warm and dry. No rash noted.  Psychiatric: He has a normal mood and affect.    Assessment/Plan:  Active Problems:   CVA (cerebral vascular accident) (HCC)  Transient Ischemic Attack - Patient presented with dizziness and aphasia that lasted 30 minutes and resolved completely. CT of the head with no acute findings. CTA with arthrosclerosis of carotid siphon, right vertebral artery occlusion, and stenosis internal carotid artery bulb bilaterally.MRI brain showed old  infarct, but no acute processes. Lipid panel remarkable only for low LDL at 29. Normal neurological exam at present. Home medications include simvastatin 40mg  QD (moderate potency statin). Neurology recommends vascular surgery consult given degree of disease seen on CTA. Pt evaluated by PT, OT, and SLP who have no concerns. Plan - neurology consulted, following - neurology recommends vascular surgery consult for inpatient vs outpatient evaluation - permissive hypertension for 24-48h - labetalol for systolic >220 or diastolic > 140.  - F/u Echo read - Hb A1c pending. Hb A1c lab machine is broken and the test needs to be sent out for results. This will not result during his hospitalization. - aspirin 81mg  - continue high intensity statin therapy with lipitor 80mg  QD - placed on telemetry  AKI or CKD - Creatinine 1.7 on admission, but unclear baseline. 1.6 eight years ago. Today: 1.59. Most likely a chronic process rather than acute. Patient's med list includes both diclofenac 75 mg PRN and indomethacin 50mg  BID, which he believes are for gout, as well as lisinopril. Plan - Consider stopping double NSAID therapy  Hypertension:  - Patient has been hypertensive in the 160s today. Home regimen includes hctz and lisinopril.  Plan - permissive HTN as above  OSA - Compliant with home CPAP machine. Uses 1L oxygen at night at home. Plan - CPAP at night  Dispo: Anticipated discharge today or tomorrow based on vascular surgery's decision to evaluate him in the inpatient or outpatient setting  Gregory [name], Cathleen Corti, MD 09/04/2018,  9:45 AM Pager: [phone]

## 2018-09-04 NOTE — Evaluation (Addendum)
Occupational Therapy Evaluation and Discharge Patient Details Name: Gregory [name] MRN: [id number] DOB: [birth year] Today's Date: 09/04/2018    History of Present Illness 67 yo M with a chief complaint of aphasia.  This lasted for about an hour and a half and then slowly improved.  He arrived as a code stroke. Neuro work up underway.   Clinical Impression   PTA, pt required assistance for LB ADL and was limited in his functional mobility. He utilizes cane for household mobility but power scooter for any community access. Pt currently participating in ADL tasks at baseline requiring min guard assist for tub/shower transfers and toilet transfers with cane. Pt will have assistance at home from multiple family members and demonstrates good understanding of energy conservation strategies. Educated pt concerning BE FAST stroke symptoms and he verbalizes understanding. No further acute OT needs identified and OT will sign off.     Follow Up Recommendations  No OT follow up;Supervision/Assistance - 24 hour    Equipment Recommendations  None recommended by OT    Recommendations for Other Services       Precautions / Restrictions Precautions Precautions: Fall Precaution Comments: wears CPAP at night Restrictions Weight Bearing Restrictions: No      Mobility Bed Mobility Overal bed mobility: Modified Independent Bed Mobility: Supine to Sit           General bed mobility comments: OOB in rehab gym with PT on my arrival.   Transfers Overall transfer level: Modified independent Equipment used: Straight cane             General transfer comment: Unsteady but no physical assistance required. Per pt this is baseline.     Balance Overall balance assessment: Mild deficits observed, not formally tested                                         ADL either performed or assessed with clinical judgement   ADL Overall ADL's : Needs assistance/impaired;At  baseline Eating/Feeding: Set up;Sitting   Grooming: Supervision/safety;Standing   Upper Body Bathing: Set up;Sitting   Lower Body Bathing: Minimal assistance;Sit to/from stand   Upper Body Dressing : Set up;Sitting   Lower Body Dressing: Minimal assistance;Sit to/from stand   Toilet Transfer: Min guard;Ambulation(with cane) Toilet Transfer Details (indicate cue type and reason): Noted HR elevated into 150s with mobility in hallway. Pt reports this is baseline.  Toileting- Architect and Hygiene: Sit to/from stand;Min guard   Tub/ Engineer, structural: Min guard;Ambulation;Shower seat;Grab bars;Tub transfer(with cane)   Functional mobility during ADLs: Min guard;Cane General ADL Comments: Pt able to demonstrate baseline participation in ADL at this time. Educated concerning energy conservation as well as home set-up for safety and he verbalizes and demonstrates understanding.      Vision Baseline Vision/History: Wears glasses Wears Glasses: At all times(not available during assessment) Patient Visual Report: No change from baseline Vision Assessment?: Yes Eye Alignment: Within Functional Limits Ocular Range of Motion: Within Functional Limits Alignment/Gaze Preference: Within Defined Limits Tracking/Visual Pursuits: Able to track stimulus in all quads without difficulty Saccades: Within functional limits Convergence: Within functional limits Visual Fields: No apparent deficits Additional Comments: Pt with baseline visual acuity deficits but functional with scanning and peripheral vision testing.      Perception     Praxis      Pertinent Vitals/Pain Pain Assessment: Faces Faces Pain Scale: Hurts a little bit  Pain Location: back Pain Descriptors / Indicators: Aching;Constant;Sore Pain Intervention(s): Limited activity within patient's tolerance;Monitored during session     Hand Dominance Right   Extremity/Trunk Assessment Upper Extremity Assessment Upper  Extremity Assessment: Overall WFL for tasks assessed   Lower Extremity Assessment Lower Extremity Assessment: Overall WFL for tasks assessed   Cervical / Trunk Assessment Cervical / Trunk Assessment: (history of back problems)   Communication Communication Communication: No difficulties   Cognition Arousal/Alertness: Awake/alert Behavior During Therapy: WFL for tasks assessed/performed Overall Cognitive Status: Within Functional Limits for tasks assessed                                 General Comments: Pt demonstrates good understanding of energy conservation techniques.    General Comments       Exercises     Shoulder Instructions      Home Living Family/patient expects to be discharged to:: Private residence Living Arrangements: Spouse/significant other;Children Available Help at Discharge: Family Type of Home: House Home Access: Stairs to enter Secretary/administratorntrance Stairs-Number of Steps: 2 Entrance Stairs-Rails: None Home Layout: One level     Bathroom Shower/Tub: Chief Strategy OfficerTub/shower unit   Bathroom Toilet: Standard     Home Equipment: Cane - single point;Bedside commode;Shower seat;Toilet riser;Grab bars - tub/shower;Electric scooter;Grab bars - toilet;Adaptive equipment Adaptive Equipment: Reacher        Prior Functioning/Environment Level of Independence: Needs assistance  Gait / Transfers Assistance Needed: Pt uses cane for short distance mobility in his home. For any longer distances or community mobility; pt utilizes power scooter.  ADL's / Homemaking Assistance Needed: Pt has assistance for LB dressing and bathing tasks at baseline.    Comments: Family assisting at home        OT Problem List: Decreased strength;Decreased activity tolerance;Impaired balance (sitting and/or standing);Decreased safety awareness      OT Treatment/Interventions:      OT Goals(Current goals can be found in the care plan section) Acute Rehab OT Goals Patient Stated Goal:  to go home as soon as possible OT Goal Formulation: With patient  OT Frequency:     Barriers to D/C:            Co-evaluation              AM-PAC PT "6 Clicks" Daily Activity     Outcome Measure Help from another person eating meals?: None Help from another person taking care of personal grooming?: None Help from another person toileting, which includes using toliet, bedpan, or urinal?: A Little Help from another person bathing (including washing, rinsing, drying)?: A Little Help from another person to put on and taking off regular upper body clothing?: None Help from another person to put on and taking off regular lower body clothing?: A Little 6 Click Score: 21   End of Session Equipment Utilized During Treatment: (straight cane)  Activity Tolerance: Patient tolerated treatment well Patient left: in chair;with call bell/phone within reach  OT Visit Diagnosis: Other abnormalities of gait and mobility (R26.89)                Time: [id number] OT Time Calculation (min): 14 min Charges:  OT General Charges $OT Visit: 1 Visit OT Evaluation $OT Eval Low Complexity: 1 Low  Derrell Lollingharity Anne Tayloranne Lekas, OTR/L Acute Rehabilitation Services Pager [phone] Office [phone]   Deer'S Head CenterCharity A Karista Aispuro 09/04/2018, 11:37 AM

## 2018-09-04 NOTE — Progress Notes (Signed)
STROKE TEAM PROGRESS NOTE   HISTORY OF PRESENT ILLNESS (per record) Mikkel Charrette is a 67 y.o. male past medical history of hypertension, gout, sleep apnea, was in his usual state of health until about 4:30 PM when he had sudden onset of difficulty speaking. His girlfriend said that he was attempting to talk but had his words made no sense. His speech was also slurred.  There was no weakness that he felt but he felt dizzy at the time. He described the dizziness as lightheadedness. He has not had similar episodes in the past. There is been no preceding illnesses sicknesses prior to this presentation Denies any chest pain shortness of breath nausea vomiting. Denies taking any pain medications or sedating medications Denies any drug abuse tobacco abuse or alcohol abuse He gets his care at the [facility] in [place]. He reports he is on a blood thinner but could not tell me the name of the blood thinner or the indication for use. Family member-girlfriend at the bedside also could not tell me what medicines he is on Another family member is on their way with the medications.  LKW: 4:30 PM on 09/03/2018 tpa given?: no, NIH 1 Premorbid modified Rankin scale (mRS): 1 for slight difficulty walking due to spinal stenosis   SUBJECTIVE (INTERVAL HISTORY) His family is not at bedside, MRI showed no stroke. He is improved.    OBJECTIVE Vitals:   09/04/18 0230 09/04/18 0420 09/04/18 0530 09/04/18 0702  BP: (!) 144/81 (!) 163/99 (!) 159/103 (!) 164/108  Pulse: 90  92 96  Resp: 14 13 12 17   Temp: 97.9 F (36.6 C) 98.1 F (36.7 C) 98.1 F (36.7 C)   TempSrc: Axillary Axillary Axillary   SpO2: 97% 97% 94% 99%  Weight:      Height:        CBC:  Recent Labs  Lab 09/03/18 1650 09/03/18 1703  WBC 7.8  --   NEUTROABS 4.8  --   HGB 14.0 16.0  HCT 47.7 47.0  MCV 97.5  --   PLT 200  --     Basic Metabolic Panel:  Recent Labs  Lab 09/03/18 1650 09/03/18 1703 09/04/18 0638   NA 136 139 139  K 5.2* 4.7 5.3*  CL 100 105 103  CO2 24  --  25  GLUCOSE 131* 126* 104*  BUN 24* 28* 23  CREATININE 1.63* 1.70* 1.59*  CALCIUM 9.2  --  9.2    Lipid Panel:     Component Value Date/Time   CHOL 147 09/04/2018 0638   TRIG 114 09/04/2018 0638   HDL 29 (L) 09/04/2018 0638   CHOLHDL 5.1 09/04/2018 0638   VLDL 23 09/04/2018 0638   LDLCALC 95 09/04/2018 0638   HgbA1c: No results found for: HGBA1C Urine Drug Screen: No results found for: LABOPIA, COCAINSCRNUR, LABBENZ, AMPHETMU, THCU, LABBARB  Alcohol Level No results found for: ETH  IMAGING   Ct Angio Head W Or Wo Contrast Ct Angio Neck W Or Wo Contrast 09/03/2018 IMPRESSION:  Advanced atherosclerosis of the aortic arch with pronounced intimal irregularity. No evidence of aneurysm or dissection however.  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 Detail is somewhat limited secondary to artifact from cervical fusion procedures, but I believe there is narrowing of the lumen to 1 mm or less on each side, which would place the patient at risk of ICA occlusion. Atherosclerosis in the carotid siphon regions with stenosis estimated at 50% on each  side. No large or medium vessel anterior circulation occlusion identified. There is atherosclerotic irregularity of the medium to small vessels diffusely.  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 Left vertebral artery is a small vessel which does show flow through the cervical region and foramen magnum to the basilar.  The basilar artery is a small diseased vessel. Posterior circulation branch vessels do show flow but there is atherosclerotic disease diffusely with medium to small vessel atherosclerosis, particularly severe in the left PCA territory.     Mr Brain Wo Contrast 09/03/2018 IMPRESSION:  1. No acute intracranial process.  2. 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  3. Otherwise  negative noncontrast MRI head for age.    Ct Head Code Stroke Wo Contrast 09/03/2018 IMPRESSION:  1. No acute finding. Mild atrophy and chronic small-vessel change of the white matter.  2. ASPECTS is 10.    Transthoracic Echocardiogram - pending 00/00/00   PHYSICAL EXAM Blood pressure (!) 164/108, pulse 96, temperature 98.1 F (36.7 C), temperature source Axillary, resp. rate 17, height 5\' 4"  (1.626 m), weight 106.8 kg, SpO2 99 %.   Physical exam: Exam: Gen: NAD, conversant, well nourised, obese, well groomed                     CV: RRR, no MRG. No Carotid Bruits. No peripheral edema, warm, nontender Eyes: Conjunctivae clear without exudates or hemorrhage  Neuro: Detailed Neurologic Exam  Speech:    Speech is slightly dysarthric; Cognition:    The patient is oriented to person, place, and time;     recent and remote memory appear intact    language without aphasia    normal attention, concentration Cranial Nerves:    The pupils are equal, round, and reactive to light. T Visual fields are full to finger confrontation. Extraocular movements are intact. Trigeminal sensation is intact and the muscles of mastication are normal. The face is symmetric. The palate elevates in the midline. Hearing intact. Voice is normal. Shoulder shrug is normal. The tongue has normal motion without fasciculations.   Coordination:    No dysmetria noted  Motor Observation:    No asymmetry, no atrophy, and no involuntary movements noted. Tone:    Normal muscle tone.      Strength:    Strength is V/V in the upper and lower limbs.      Sensation: intact to LT     Reflex Exam:  DTR's:    Deep tendon reflexes in the upper and lower extremities are symmetrical bilaterally.         ASSESSMENT/PLAN Mr. Caren HazyDonald Lee Louth is a 67 y.o. male with history of HTN, spinal stenosis, Gout, OSA on home CPAP, and previous strokes by imaging presenting with elevated BP, dizziness and speech  difficulties. He did not receive IV t-PA due to minimal deficits.  Probable TIA: Likely embolic based on previous infarcts.  Resultant  Dysarthria improving  CT head - No acute finding.  MRI head - No acute intracranial process. 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   MRA head - not performed  CTA H&N - Severe bilateral carotid stenosis  Carotid Doppler - CTA neck performed - carotid dopplers not indicated.  2D Echo - pending  LDL - 95  HgbA1c - pending  VTE prophylaxis - Lovenox  Diet -  - Heart healthy with thin liquids.  No antithrombotic prior to admission, now on aspirin 325 mg daily.   Patient counseled  to be compliant with his antithrombotic medications  Ongoing aggressive stroke risk factor management  Therapy recommendations:  pending  Disposition:  Pending  Hypertension  Stable . Permissive hypertension (OK if < 220/120) but gradually normalize in 5-7 days . Long-term BP goal normotensive  Hyperlipidemia  Lipid lowering medication PTA:  Zocor 40 mg daily  LDL 95, goal < 70  Current lipid lowering medication: Lipitor 80 mg daily  Continue statin at discharge   Other Stroke Risk Factors  Advanced age  Obesity, Body mass index is 40.42 kg/m., recommend weight loss, diet and exercise as appropriate   Hx stroke/TIA  Obstructive sleep apnea, on CPAP at home    Other Active Problems  Severe stenosis in the internal carotid artery bulb regions on each side -> vascular surgery consult.  ? "blood thinner" prior to admission per patient history.  Pt is followed at the [facility] in [place].  Discussed with Dr Lucia Gaskins - recommend ASA 81 mg and Plavix 75 mg daily until carotid stenosis has been addressed.Needs Vascular consult inpatient, please order.      Hospital day # 0   Personally examined patient and images, and have participated in and made any corrections needed to history, physical, neuro exam,assessment and plan as stated  above.  I have personally obtained the history, evaluated lab date, reviewed imaging studies and agree with radiology interpretations.    Naomie Dean, MD Stroke Neurology        To contact Stroke Continuity provider, please refer to [email or web address]. After hours, contact General Neurology

## 2018-09-05 ENCOUNTER — Observation Stay (HOSPITAL_BASED_OUTPATIENT_CLINIC_OR_DEPARTMENT_OTHER): Payer: Medicare HMO

## 2018-09-05 DIAGNOSIS — Z79899 Other long term (current) drug therapy: Secondary | ICD-10-CM

## 2018-09-05 DIAGNOSIS — I6523 Occlusion and stenosis of bilateral carotid arteries: Secondary | ICD-10-CM

## 2018-09-05 DIAGNOSIS — N183 Chronic kidney disease, stage 3 unspecified: Secondary | ICD-10-CM | POA: Diagnosis present

## 2018-09-05 DIAGNOSIS — I129 Hypertensive chronic kidney disease with stage 1 through stage 4 chronic kidney disease, or unspecified chronic kidney disease: Secondary | ICD-10-CM | POA: Diagnosis not present

## 2018-09-05 DIAGNOSIS — Z7982 Long term (current) use of aspirin: Secondary | ICD-10-CM

## 2018-09-05 DIAGNOSIS — I10 Essential (primary) hypertension: Secondary | ICD-10-CM | POA: Diagnosis present

## 2018-09-05 DIAGNOSIS — G459 Transient cerebral ischemic attack, unspecified: Secondary | ICD-10-CM | POA: Diagnosis not present

## 2018-09-05 DIAGNOSIS — I350 Nonrheumatic aortic (valve) stenosis: Secondary | ICD-10-CM

## 2018-09-05 DIAGNOSIS — R011 Cardiac murmur, unspecified: Secondary | ICD-10-CM

## 2018-09-05 DIAGNOSIS — R7303 Prediabetes: Secondary | ICD-10-CM | POA: Diagnosis not present

## 2018-09-05 DIAGNOSIS — N179 Acute kidney failure, unspecified: Secondary | ICD-10-CM | POA: Diagnosis present

## 2018-09-05 DIAGNOSIS — M109 Gout, unspecified: Secondary | ICD-10-CM

## 2018-09-05 DIAGNOSIS — Z7902 Long term (current) use of antithrombotics/antiplatelets: Secondary | ICD-10-CM

## 2018-09-05 LAB — HEMOGLOBIN A1C
Hgb A1c MFr Bld: 6.2 % — ABNORMAL HIGH (ref 4.8–5.6)
Mean Plasma Glucose: 131 mg/dL

## 2018-09-05 MED ORDER — ASPIRIN 81 MG PO TBEC: 81.0000 mg | DELAYED_RELEASE_TABLET | Freq: Every day | ORAL | 1 refills | Status: DC

## 2018-09-05 MED ORDER — CLOPIDOGREL BISULFATE 75 MG PO TABS: 75.0000 mg | ORAL_TABLET | Freq: Every day | ORAL | 1 refills | Status: DC

## 2018-09-05 MED ORDER — ATORVASTATIN CALCIUM 80 MG PO TABS: 80.0000 mg | ORAL_TABLET | Freq: Every day | ORAL | 1 refills | Status: DC

## 2018-09-05 NOTE — Progress Notes (Signed)
STROKE TEAM PROGRESS NOTE   HISTORY OF PRESENT ILLNESS (per record) Gregory [name] is a [age] y.o. male past medical history of hypertension, gout, sleep apnea, was in his usual state of health until about 4:30 PM when he had sudden onset of difficulty speaking. His girlfriend said that he was attempting to talk but had his words made no sense. His speech was also slurred.  There was no weakness that he felt but he felt dizzy at the time. He described the dizziness as lightheadedness. He has not had similar episodes in the past. There is been no preceding illnesses sicknesses prior to this presentation Denies any chest pain shortness of breath nausea vomiting. Denies taking any pain medications or sedating medications Denies any drug abuse tobacco abuse or alcohol abuse He gets his care at the [facility] in [place]. He reports he is on a blood thinner but could not tell me the name of the blood thinner or the indication for use. Family member-girlfriend at the bedside also could not tell me what medicines he is on Another family member is on their way with the medications.  LKW: 4:30 PM on 09/03/2018 tpa given?: no, NIH 1 Premorbid modified Rankin scale (mRS): 1 for slight difficulty walking due to spinal stenosis   SUBJECTIVE (INTERVAL HISTORY) His family is not at bedside, MRI showed no stroke. He is improved.CT angiogram showed heavily calcified lesion with possibly severe stenosis but carotid ultrasound shows mild plaque but no significant flow-limiting stenosis at either carotid bifurcation.    OBJECTIVE Vitals:   09/04/18 1918 09/04/18 2340 09/05/18 0303 09/05/18 0756  BP: (!) 108/58 (!) 146/57 129/64 (!) 155/99  Pulse:  96 84 86  Resp:    16  Temp: 98.4 F (36.9 C) 97.6 F (36.4 C) 98.4 F (36.9 C) 98.9 F (37.2 C)  TempSrc: Oral Oral Oral Oral  SpO2:  96% 100% 100%  Weight:      Height:        CBC:  Recent Labs  Lab 09/03/18 1650 09/03/18 1703  WBC 7.8   --   NEUTROABS 4.8  --   HGB 14.0 16.0  HCT 47.7 47.0  MCV 97.5  --   PLT 200  --     Basic Metabolic Panel:  Recent Labs  Lab 09/03/18 1650 09/03/18 1703 09/04/18 0638  NA 136 139 139  K 5.2* 4.7 5.3*  CL 100 105 103  CO2 24  --  25  GLUCOSE 131* 126* 104*  BUN 24* 28* 23  CREATININE 1.63* 1.70* 1.59*  CALCIUM 9.2  --  9.2    Lipid Panel:     Component Value Date/Time   CHOL 147 09/04/2018 0638   TRIG 114 09/04/2018 0638   HDL 29 (L) 09/04/2018 0638   CHOLHDL 5.1 09/04/2018 0638   VLDL 23 09/04/2018 0638   LDLCALC 95 09/04/2018 0638   HgbA1c:  Lab Results  Component Value Date   HGBA1C 6.2 (H) 09/04/2018   Urine Drug Screen: No results found for: LABOPIA, COCAINSCRNUR, LABBENZ, AMPHETMU, THCU, LABBARB  Alcohol Level No results found for: ETH  IMAGING   Ct Angio Head W Or Wo Contrast Ct Angio Neck W Or Wo Contrast 09/03/2018 IMPRESSION:  Advanced atherosclerosis of the aortic arch with pronounced intimal irregularity. No evidence of aneurysm or dissection however.  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 Detail is somewhat limited secondary to artifact from cervical fusion procedures, but I believe there  is narrowing of the lumen to 1 mm or less on each side, which would place the patient at risk of ICA occlusion. Atherosclerosis in the carotid siphon regions with stenosis estimated at 50% on each side. No large or medium vessel anterior circulation occlusion identified. There is atherosclerotic irregularity of the medium to small vessels diffusely.  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 Left vertebral artery is a small vessel which does show flow through the cervical region and foramen magnum to the basilar.  The basilar artery is a small diseased vessel. Posterior circulation branch vessels do show flow but there is atherosclerotic disease diffusely with medium to small vessel  atherosclerosis, particularly severe in the left PCA territory.     Mr Brain Wo Contrast 09/03/2018 IMPRESSION:  1. No acute intracranial process.  2. 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  3. Otherwise negative noncontrast MRI head for age.    Ct Head Code Stroke Wo Contrast 09/03/2018 IMPRESSION:  1. No acute finding. Mild atrophy and chronic small-vessel change of the white matter.  2. ASPECTS is 10.    Transthoracic Echocardiogram - Left ventricle: The cavity size was normal. Wall thickness was   increased in a pattern of mild LVH. The estimated ejection   fraction was 55%. Although no diagnostic regional wall motion   abnormality was identified, this possibility cannot be completely   excluded on the basis of this study  PHYSICAL EXAM Blood pressure (!) 155/99, pulse 86, temperature 98.9 F (37.2 C), temperature source Oral, resp. rate 16, height 5\' 4"  (1.626 m), weight 106.8 kg, SpO2 100 %.   Physical exam: Exam: Gen:  Middle-aged male obese, well groomed                     CV: RRR, no MRG. No Carotid Bruits. No peripheral edema, warm, nontender Eyes: Conjunctivae clear without exudates or hemorrhage  Neurological Exam ;  Awake  Alert oriented x 3. Normal speech and language.eye movements full without nystagmus.fundi were not visualized. Vision acuity and fields appear normal. Hearing is normal. Palatal movements are normal. Face symmetric. Tongue midline. Normal strength, tone, reflexes and coordination. Normal sensation. Gait deferred.     ASSESSMENT/PLAN [name] is a 67 y.o. male with history of HTN, spinal stenosis, Gout, OSA on home CPAP, and previous strokes by imaging presenting with elevated BP, dizziness and speech difficulties. He did not receive IV t-PA due to minimal deficits.  Probable left hemisphericTIA: Likely embolic based on previous infarcts.  Resultant  Dysarthria improving  CT head - No acute finding.  MRI head - No  acute intracranial process. 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   MRA head - not performed  CTA H&N - Severe bilateral carotid stenosis  Carotid Doppler - CTA neck performed - carotid dopplers not indicated. 2D Echo - Left ventricle: The cavity size was normal. Wall thickness was   increased in a pattern of mild LVH. The estimated ejection   fraction was 55%. Although no diagnostic regional wall motion   abnormality was identified, this possibility cannot be completely    excluded on the basis of this study  LDL - 95  HgbA1c - 6.2  VTE prophylaxis - Lovenox  Diet -  - Heart healthy with thin liquids.  No antithrombotic prior to admission, now on aspirin 325 mg daily.   Patient counseled to be compliant with his antithrombotic medications  Ongoing aggressive stroke risk  factor management  Therapy recommendations:  pending  Disposition:  Pending  Hypertension  Stable . Permissive hypertension (OK if < 220/120) but gradually normalize in 5-7 days . Long-term BP goal normotensive  Hyperlipidemia  Lipid lowering medication PTA:  Zocor 40 mg daily  LDL 95, goal < 70  Current lipid lowering medication: Lipitor 80 mg daily  Continue statin at discharge   Other Stroke Risk Factors  Advanced age  Obesity, Body mass index is 40.42 kg/m., recommend weight loss, diet and exercise as appropriate   Hx stroke/TIA  Obstructive sleep apnea, on CPAP at home    Other Active Problems  Severe stenosis in the internal carotid artery bulb regions on each side -> vascular surgery consult.but carotid Dopplers show no significant stenosis.  ? "blood thinner" prior to admission per patient history.  Pt is followed at the [facility] in [place].  Discussed with Dr Lucia Gaskins - recommend ASA 81 mg and Plavix 75 mg daily until carotid stenosis has been addressed.Needs Vascular consult inpatient, please order.      Hospital day # 0   Personally examined patient and  images, and have participated in and made any corrections needed to history, physical, neuro exam,assessment and plan as stated above.  I have personally obtained the history, evaluated lab date, reviewed imaging studies and agree with radiology interpretations. CT angiogram and carotid Dopplers showed discordant findings and in the setting of heavily calcified plaque either of the 2 studies can overestimate or underestimate the degree of stenosis. Recommend doing diagnostic cerebral catheter angiogram but will await vascular surgery decision. Greater than 50% time during this 25 minute visit was spent on counseling and coordination of care about his TIA and discussion about carotid stenosis and treatment plan and answered questions   Delia Heady, MD Stroke Neurology        To contact Stroke Continuity provider, please refer to [email or web address]. After hours, contact General Neurology

## 2018-09-05 NOTE — Care Management Note (Signed)
Case Management Note  Patient Details  Name: Gregory [name] MRN: [id number] Date of Birth: March 10, 1951  Subjective/Objective:    Pt in with TIA. He is from home with his spouse. He has: electric scooter, CPAP and walker. Pt denies any transportation issues and no issues obtaining his medications.  [facility]                Action/Plan: No f/u per PT/OT and no DME needs. Per OT pt needs 24 hour supervision. Pt states his wife can provide supervision at home. She will also provide transportation home. No further needs per CM.   Expected Discharge Date:                  Expected Discharge Plan:  Home/Self Care  In-House Referral:     Discharge planning Services  CM Consult  Post Acute Care Choice:    Choice offered to:     DME Arranged:    DME Agency:     HH Arranged:    HH Agency:     Status of Service:  Completed, signed off  If discussed at [employer] Length of Stay Meetings, dates discussed:    Additional Comments:  Kermit BaloKelli F Maxene Byington, RN 09/05/2018, 1:12 PM

## 2018-09-05 NOTE — Progress Notes (Signed)
VASCULAR LAB PRELIMINARY  PRELIMINARY  PRELIMINARY  PRELIMINARY  Carotid duplex completed.    Preliminary report:  1-39% ICA stenosis, bilaterally. Duplex findings do not correlate with the CTA results.  Called results to Dr. Georges LynchSethi   Glee Lashomb, [facility], RVT 09/05/2018, 12:47 PM

## 2018-09-05 NOTE — Progress Notes (Signed)
Internal Medicine Attending:   I saw and examined the patient. I reviewed the resident's note and I agree with the resident's findings and plan as documented in the resident's note.  Principal Problem:   TIA (transient ischemic attack) Active Problems:   Pre-diabetes   CKD (chronic kidney disease) stage 3, GFR 30-59 ml/min (HCC)   Bilateral carotid artery stenosis   Hospital day #2 for evaluation of TIA. Symptoms of dizziness remain resolved, he feels back to normal. Workup has revealed essential hypertension, prediabetes with A1c of 6.2%, normal lipids, and bilateral carotid artery stenosis. We have disparate test results with CT angiogram showing severe stenosis bilaterally with only a 1mm open lumen focally at the bilateral carotid bulbs, but the ultrasound with doppler shows open arteries with minimal stenosis (1-39%). We are consulting with vascular surgery and stroke team to make a plan for management of these findings.

## 2018-09-05 NOTE — H&P (View-Only) (Signed)
[name] NOTE   MRN : [id number]  Reason for Consult: aphasia and dizziness B carotid stenosis Referring Physician: Dr. Avie Arenas  History of Present Illness: 67 y/o male presented to the ED with sudden on set of difficulty speaking and dizziness.  He stated he knew the words in his mind, but could not say them.  His symptoms have completely resolved at this time.  He denise previous symptoms of stroke or TIA.    He has carotid stenosis bilaterally on CTA.  We have been consult secondary to Carotid stenosis with the presentation of TIA.  No acute brain injury visualized on MRI or CTA.   Past medical history HTN, spinal stenosis, Gout, cervical spinal fusion and OSA on home CPAP.     Current Facility-Administered Medications  Medication Dose Route Frequency Provider Last Rate Last Dose  . 0.9 %  sodium chloride infusion   Intravenous Continuous Lanelle Bal, MD 10 mL/hr at 09/03/18 2142    . acetaminophen (TYLENOL) tablet 650 mg  650 mg Oral Q4H PRN Lanelle Bal, MD       Or  . acetaminophen (TYLENOL) solution 650 mg  650 mg Per Tube Q4H PRN Lanelle Bal, MD       Or  . acetaminophen (TYLENOL) suppository 650 mg  650 mg Rectal Q4H PRN Lanelle Bal, MD      . allopurinol (ZYLOPRIM) tablet 300 mg  300 mg Oral Daily Lanelle Bal, MD   300 mg at 09/04/18 1114  . aspirin EC tablet 81 mg  81 mg Oral Daily Dorrell, Cathleen Corti, MD   81 mg at 09/04/18 1400  . atorvastatin (LIPITOR) tablet 80 mg  80 mg Oral q1800 Lanelle Bal, MD   80 mg at 09/04/18 1831  . clopidogrel (PLAVIX) tablet 75 mg  75 mg Oral Daily Dorrell, Cathleen Corti, MD   75 mg at 09/04/18 2143  . enoxaparin (LOVENOX) injection 40 mg  40 mg Subcutaneous Q24H Lanelle Bal, MD   40 mg at 09/04/18 1114  . labetalol (NORMODYNE,TRANDATE) injection 10 mg  10 mg Intravenous Q2H PRN Lanelle Bal, MD      . senna-docusate (Senokot-S) tablet 1 tablet  1  tablet Oral QHS PRN Lanelle Bal, MD        Pt meds include: Statin :Yes Betablocker: No ASA: No Other anticoagulants/antiplatelets: currently on aspirin and Plavix  History reviewed. No pertinent past medical history.  History reviewed. No pertinent surgical history.  Social History Social History   Tobacco Use  . Smoking status: Not on file  Substance Use Topics  . Alcohol use: Not on file  . Drug use: Not on file    Family History No family history on file. Unknown  No Known Allergies   REVIEW OF SYSTEMS  General: [ ]  Weight loss, [ ]  Fever, [ ]  chills Neurologic: [x ] Dizziness, [ ]  Blackouts, [ ]  Seizure [ ]  Stroke, [ ]  "Mini stroke", [ ]  Slurred speech, [ ]  Temporary blindness; [ ]  weakness in arms or legs, [ ]  Hoarseness [ ]  Dysphagia Cardiac: [ ]  Chest pain/pressure, [ ]  Shortness of breath at rest [ ]  Shortness of breath with exertion, [ ]  Atrial fibrillation or irregular heartbeat  Vascular: [ ]  Pain in legs with walking, [ ]  Pain in legs at rest, [ ]  Pain in legs at night,  [ ]  Non-healing ulcer, [ ]  Blood clot in vein/DVT,   Pulmonary: [ ]  Home oxygen, [ ]  Productive cough, [ ]   Coughing up blood, [ ]  Asthma,  [ ]  Wheezing [ ]  COPD, [x] sleep apnea on CPAP Musculoskeletal:  [ ]  Arthritis, [x ] Low back pain, [ ]  Joint pain Hematologic: [ ]  Easy Bruising, [ ]  Anemia; [ ]  Hepatitis Gastrointestinal: [ ]  Blood in stool, [ ]  Gastroesophageal Reflux/heartburn, Urinary: [ ]  chronic Kidney disease, [ ]  on HD - [ ]  MWF or [ ]  TTHS, [ ]  Burning with urination, [ ]  Difficulty urinating Skin: [ ]  Rashes, [ ]  Wounds Psychological: [ ]  Anxiety, [ ]  Depression  Physical Examination Vitals:   09/04/18 1918 09/04/18 2340 09/05/18 0303 09/05/18 0756  BP: (!) 108/58 (!) 146/57 129/64 (!) 155/99  Pulse:  96 84 86  Resp:    16  Temp: 98.4 F (36.9 C) 97.6 F (36.4 C) 98.4 F (36.9 C) 98.9 F (37.2 C)  TempSrc: Oral Oral Oral Oral  SpO2:  96% 100% 100%  Weight:       Height:       Body mass index is 40.42 kg/m.  General:  WDWN in NAD,obese Gait: ambulates with straight cain secondary to spinal stenosis HENT: WNL, nomocephalic Eyes: Pupils equal vision intact and equal Pulmonary: normal non-labored breathing , without Rales, rhonchi,  wheezing Cardiac: RRR, without  Murmurs, rubs or gallops; No carotid bruits Abdomen: soft, NT, no masses Skin: no rashes, ulcers noted;  no Gangrene , no cellulitis; no open wounds;   Vascular Exam/Pulses:Palpable radial and brachial pulses   Musculoskeletal: no muscle wasting or atrophy; no edema  Neurologic: A&O X 3; Appropriate Affect ;  SENSATION: normal; MOTOR FUNCTION: 5/5 Symmetric Speech is fluent/normal   Significant Diagnostic Studies: CBC Lab Results  Component Value Date   WBC 7.8 09/03/2018   HGB 16.0 09/03/2018   HCT 47.0 09/03/2018   MCV 97.5 09/03/2018   PLT 200 09/03/2018    BMET    Component Value Date/Time   NA 139 09/04/2018 0638   K 5.3 (H) 09/04/2018 0638   CL 103 09/04/2018 0638   CO2 25 09/04/2018 0638   GLUCOSE 104 (H) 09/04/2018 0638   BUN 23 09/04/2018 0638   CREATININE 1.59 (H) 09/04/2018 0638   CALCIUM 9.2 09/04/2018 0638   GFRNONAA 43 (L) 09/04/2018 0638   GFRAA 49 (L) 09/04/2018 1610   Estimated Creatinine Clearance: 48.5 mL/min (A) (by C-G formula based on SCr of 1.59 mg/dL (H)).  COAG Lab Results  Component Value Date   INR 1.00 09/03/2018     Non-Invasive Vascular Imaging:  CTA head and neck   IMPRESSION: Advanced atherosclerosis of the aortic arch with pronounced intimal irregularity. No evidence of aneurysm or dissection however.  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 Detail is somewhat limited secondary to artifact from cervical fusion procedures, but I believe there is narrowing of the lumen to 1 mm or less on each side, which would place the patient at risk of ICA occlusion.  Atherosclerosis in the  carotid siphon regions with stenosis estimated at 50% on each side. No large or medium vessel anterior circulation occlusion identified. There is atherosclerotic irregularity of the medium to small vessels diffusely.  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 Left vertebral artery is a small vessel which does show flow through the cervical region and foramen magnum to the basilar. The basilar artery is a small diseased vessel. Posterior circulation branch vessels do show flow but there is atherosclerotic disease diffusely with medium  to small vessel atherosclerosis, particularly severe in the left PCA territory.  Pending carotid duplex  ASSESSMENT/PLAN:  TIA with resolved symptoms  Bilateral carotid stenosis on CTA.  Pending carotid duplex.  His symptoms are directed form the left side with the aphasia symptoms.  He may be in need of left CEA and possibly right CEA in his future care.   TIA work in progress.  Started on aspirin and Plavix.     Mosetta Pigeonmma Maureen Collins 09/05/2018 10:13 AM   Pt on ASA Plavix Statin.  15 min episode of dizziness and broca's aphasia.  No amaurosis or other localizing symptoms.  Currently exam has no neuro findings. He has no femoral or pedal pulses so probably also has some lower extremity occlusive disease but asymptomatic. CTA suggests bilateral ICA stenosis but duplex no stenosis.  I reviewed the duplex CTA and MRI images.  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 CTA typically overestimates calcified lesions but I agree with Dr Pearlean BrownieSethi that carotid angio would be the deciding study.  Pt wishes to go home today.   I discussed with him risk benefits possible complications of carotid angio.  These include but are not limited to bleeding infection stroke risk 1/100000.  He wishes to proceed.  We will schedule for 9/20.  He can go home from my standpoint.  We will obtain aortoiliac and lower extremity duplex in our office this  week to make sure he has adequate access for angio.  Fabienne Brunsharles Jalan Bodi, MD [facility] [place] Office: [phone] Pager: [phone]

## 2018-09-05 NOTE — Discharge Instructions (Signed)
Please follow-up with your doctor at the [facility] in October at your next appointment.

## 2018-09-05 NOTE — Progress Notes (Signed)
Pt son assisted pt with his bath and wheeled him out in a wheelchair with belongings to the side. Dionne BucyP. Amo Jaydalee Bardwell RN

## 2018-09-05 NOTE — Progress Notes (Signed)
   Subjective: No acute overnight events. [name] has had no further episodes of dizziness or aphasia. He denies weakness. He states that he feels well and would like to go home.   Objective:  Vital signs in last 24 hours: Vitals:   09/04/18 1642 09/04/18 1918 09/04/18 2340 09/05/18 0303  BP: (!) 155/90 (!) 108/58 (!) 146/57 129/64  Pulse: (!) 31  96 84  Resp: 18     Temp: 98.1 F (36.7 C) 98.4 F (36.9 C) 97.6 F (36.4 C) 98.4 F (36.9 C)  TempSrc: Oral Oral Oral Oral  SpO2: 99%  96% 100%  Weight:      Height:       Physical Exam  Constitutional: He is oriented to person, place, and time. He appears well-developed and well-nourished. No distress.  HENT:  Head: Normocephalic and atraumatic.  Eyes: Pupils are equal, round, and reactive to light. EOM are normal.  Cardiovascular: Normal rate and regular rhythm. Exam reveals no gallop and no friction rub. Faint systolic murmur.  Pulmonary/Chest: Effort normal and breath sounds normal. He has no wheezes. He has no rales.  Abdominal: Soft. Bowel sounds are normal. He exhibits no distension. There is no tenderness.  Musculoskeletal: Normal range of motion. He exhibits no edema or deformity.  Neurological: He is alert and oriented to person, place, and time. No cranial nerve deficit or sensory deficit. He exhibits normal muscle tone.  Skin: Skin is warm and dry. No rash noted.  Psychiatric: He has a normal mood and affect.   Assessment/Plan:  Principal Problem:   TIA (transient ischemic attack) Active Problems:   CVA (cerebral vascular accident) (HCC)  Transient Ischemic Attack - Patient presented with dizziness and aphasia that lasted 30 minutes and resolved completely. CT of the head with no acute findings. Neurology recommends vascular surgery consult given degree of carotid disease seen on CTA.  Plan - neurology consulted, following - vascular surgery consulted, appreciate recs - permissive hypertension for 24-48h -  labetalol for systolic >220 or diastolic >140.  - continue DAPT with aspirin 81mg  and plavix 75mg  QD  - continue high intensity statin therapy with lipitor 80mg  QD  Dispo: Anticipated discharge today or tomorrow pending vascular surgery recommendations.  Dorrell, Cathleen Cortieborah N, MD 09/05/2018, 6:45 AM Pager: [phone]

## 2018-09-05 NOTE — Care Management Obs Status (Signed)
MEDICARE OBSERVATION STATUS NOTIFICATION   Patient Details  Name: Gregory [name] MRN: [id number] Date of Birth: [birth year]   Medicare Observation Status Notification Given:  Yes    Kermit BaloKelli F Kateri Balch, RN 09/05/2018, 10:29 AM

## 2018-09-05 NOTE — Progress Notes (Signed)
Pt discharge education and instructions completed with at bedside; pt voices understanding and denies any questions. Pt IV and telemetry removed; pt discharge home with family to transport him home. Pt handed his prescriptions for aspirin; plavix and lipitor. Pt requested to shower prior to leaving the unit. Will continue to closely monitor. Dionne BucyP. Amo Carah Barrientes RN

## 2018-09-05 NOTE — Consult Note (Addendum)
[facility] [place] CONSULT NOTE   MRN : [id number]  Reason for Consult: aphasia and dizziness B carotid stenosis Referring Physician: Dr. Dorrell  History of Present Illness: 67 y/o male presented to the ED with sudden on set of difficulty speaking and dizziness.  He stated he knew the words in his mind, but could not say them.  His symptoms have completely resolved at this time.  He denise previous symptoms of stroke or TIA.    He has carotid stenosis bilaterally on CTA.  We have been consult secondary to Carotid stenosis with the presentation of TIA.  No acute brain injury visualized on MRI or CTA.   Past medical history HTN, spinal stenosis, Gout, cervical spinal fusion and OSA on home CPAP.     Current Facility-Administered Medications  Medication Dose Route Frequency Provider Last Rate Last Dose  . 0.9 %  sodium chloride infusion   Intravenous Continuous Harbrecht, Lawrence, MD 10 mL/hr at 09/03/18 2142    . acetaminophen (TYLENOL) tablet 650 mg  650 mg Oral Q4H PRN Harbrecht, Lawrence, MD       Or  . acetaminophen (TYLENOL) solution 650 mg  650 mg Per Tube Q4H PRN Harbrecht, Lawrence, MD       Or  . acetaminophen (TYLENOL) suppository 650 mg  650 mg Rectal Q4H PRN Harbrecht, Lawrence, MD      . allopurinol (ZYLOPRIM) tablet 300 mg  300 mg Oral Daily Harbrecht, Lawrence, MD   300 mg at 09/04/18 1114  . aspirin EC tablet 81 mg  81 mg Oral Daily Dorrell, Deborah N, MD   81 mg at 09/04/18 1400  . atorvastatin (LIPITOR) tablet 80 mg  80 mg Oral q1800 Harbrecht, Lawrence, MD   80 mg at 09/04/18 1831  . clopidogrel (PLAVIX) tablet 75 mg  75 mg Oral Daily Dorrell, Deborah N, MD   75 mg at 09/04/18 2143  . enoxaparin (LOVENOX) injection 40 mg  40 mg Subcutaneous Q24H Harbrecht, Lawrence, MD   40 mg at 09/04/18 1114  . labetalol (NORMODYNE,TRANDATE) injection 10 mg  10 mg Intravenous Q2H PRN Harbrecht, Lawrence, MD      . senna-docusate (Senokot-S) tablet 1 tablet  1  tablet Oral QHS PRN Harbrecht, Lawrence, MD        Pt meds include: Statin :Yes Betablocker: No ASA: No Other anticoagulants/antiplatelets: currently on aspirin and Plavix  History reviewed. No pertinent past medical history.  History reviewed. No pertinent surgical history.  Social History Social History   Tobacco Use  . Smoking status: Not on file  Substance Use Topics  . Alcohol use: Not on file  . Drug use: Not on file    Family History No family history on file. Unknown  No Known Allergies   REVIEW OF SYSTEMS  General: [ ] Weight loss, [ ] Fever, [ ] chills Neurologic: [x ] Dizziness, [ ] Blackouts, [ ] Seizure [ ] Stroke, [ ] "Mini stroke", [ ] Slurred speech, [ ] Temporary blindness; [ ] weakness in arms or legs, [ ] Hoarseness [ ] Dysphagia Cardiac: [ ] Chest pain/pressure, [ ] Shortness of breath at rest [ ] Shortness of breath with exertion, [ ] Atrial fibrillation or irregular heartbeat  Vascular: [ ] Pain in legs with walking, [ ] Pain in legs at rest, [ ] Pain in legs at night,  [ ] Non-healing ulcer, [ ] Blood clot in vein/DVT,   Pulmonary: [ ] Home oxygen, [ ] Productive cough, [ ]   Coughing up blood, [ ] Asthma,  [ ] Wheezing [ ] COPD, [x]sleep apnea on CPAP Musculoskeletal:  [ ] Arthritis, [x ] Low back pain, [ ] Joint pain Hematologic: [ ] Easy Bruising, [ ] Anemia; [ ] Hepatitis Gastrointestinal: [ ] Blood in stool, [ ] Gastroesophageal Reflux/heartburn, Urinary: [ ] chronic Kidney disease, [ ] on HD - [ ] MWF or [ ] TTHS, [ ] Burning with urination, [ ] Difficulty urinating Skin: [ ] Rashes, [ ] Wounds Psychological: [ ] Anxiety, [ ] Depression  Physical Examination Vitals:   09/04/18 1918 09/04/18 2340 09/05/18 0303 09/05/18 0756  BP: (!) 108/58 (!) 146/57 129/64 (!) 155/99  Pulse:  96 84 86  Resp:    16  Temp: 98.4 F (36.9 C) 97.6 F (36.4 C) 98.4 F (36.9 C) 98.9 F (37.2 C)  TempSrc: Oral Oral Oral Oral  SpO2:  96% 100% 100%  Weight:       Height:       Body mass index is 40.42 kg/m.  General:  WDWN in NAD,obese Gait: ambulates with straight cain secondary to spinal stenosis HENT: WNL, nomocephalic Eyes: Pupils equal vision intact and equal Pulmonary: normal non-labored breathing , without Rales, rhonchi,  wheezing Cardiac: RRR, without  Murmurs, rubs or gallops; No carotid bruits Abdomen: soft, NT, no masses Skin: no rashes, ulcers noted;  no Gangrene , no cellulitis; no open wounds;   Vascular Exam/Pulses:Palpable radial and brachial pulses   Musculoskeletal: no muscle wasting or atrophy; no edema  Neurologic: A&O X 3; Appropriate Affect ;  SENSATION: normal; MOTOR FUNCTION: 5/5 Symmetric Speech is fluent/normal   Significant Diagnostic Studies: CBC Lab Results  Component Value Date   WBC 7.8 09/03/2018   HGB 16.0 09/03/2018   HCT 47.0 09/03/2018   MCV 97.5 09/03/2018   PLT 200 09/03/2018    BMET    Component Value Date/Time   NA 139 09/04/2018 0638   K 5.3 (H) 09/04/2018 0638   CL 103 09/04/2018 0638   CO2 25 09/04/2018 0638   GLUCOSE 104 (H) 09/04/2018 0638   BUN 23 09/04/2018 0638   CREATININE 1.59 (H) 09/04/2018 0638   CALCIUM 9.2 09/04/2018 0638   GFRNONAA 43 (L) 09/04/2018 0638   GFRAA 49 (L) 09/04/2018 0638   Estimated Creatinine Clearance: 48.5 mL/min (A) (by C-G formula based on SCr of 1.59 mg/dL (H)).  COAG Lab Results  Component Value Date   INR 1.00 09/03/2018     Non-Invasive Vascular Imaging:  CTA head and neck   IMPRESSION: Advanced atherosclerosis of the aortic arch with pronounced intimal irregularity. No evidence of aneurysm or dissection however.  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 Detail is somewhat limited secondary to artifact from cervical fusion procedures, but I believe there is narrowing of the lumen to 1 mm or less on each side, which would place the patient at risk of ICA occlusion.  Atherosclerosis in the  carotid siphon regions with stenosis estimated at 50% on each side. No large or medium vessel anterior circulation occlusion identified. There is atherosclerotic irregularity of the medium to small vessels diffusely.  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 Left vertebral artery is a small vessel which does show flow through the cervical region and foramen magnum to the basilar. The basilar artery is a small diseased vessel. Posterior circulation branch vessels do show flow but there is atherosclerotic disease diffusely with medium   to small vessel atherosclerosis, particularly severe in the left PCA territory.  Pending carotid duplex  ASSESSMENT/PLAN:  TIA with resolved symptoms  Bilateral carotid stenosis on CTA.  Pending carotid duplex.  His symptoms are directed form the left side with the aphasia symptoms.  He may be in need of left CEA and possibly right CEA in his future care.   TIA work in progress.  Started on aspirin and Plavix.     Gregory [name] 09/05/2018 10:13 AM   Pt on ASA Plavix Statin.  15 min episode of dizziness and broca's aphasia.  No amaurosis or other localizing symptoms.  Currently exam has no neuro findings. He has no femoral or pedal pulses so probably also has some lower extremity occlusive disease but asymptomatic. CTA suggests bilateral ICA stenosis but duplex no stenosis.  I reviewed the duplex CTA and MRI images.  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 CTA typically overestimates calcified lesions but I agree with Dr Sethi that carotid angio would be the deciding study.  Pt wishes to go home today.   I discussed with him risk benefits possible complications of carotid angio.  These include but are not limited to bleeding infection stroke risk 1/100000.  He wishes to proceed.  We will schedule for 9/20.  He can go home from my standpoint.  We will obtain aortoiliac and lower extremity duplex in our office this  week to make sure he has adequate access for angio.  Lavell Supple, MD [facility]  Office: [phone] Pager: [phone]  

## 2018-09-06 ENCOUNTER — Other Ambulatory Visit: Payer: Self-pay | Admitting: *Deleted

## 2018-09-06 ENCOUNTER — Telehealth: Payer: Self-pay | Admitting: *Deleted

## 2018-09-06 NOTE — Telephone Encounter (Signed)
Phone call to patient and Pattricia Bossnnie. In[facility] to [facility] at 7:30 am on 09/09/18 for procedure. NPO past MN night prior except for sips of water with HCTZ and Lisinopril am of 09/09/18. Must have a driver and caregiver for discharge home. Arriving early for IV hydration. Instructions for Vascular studies on 09/08/18 @[address] given by [employer]. Verbalized understanding and to call if questions.

## 2018-09-06 NOTE — Progress Notes (Signed)
LEFT MSG. FOR FAMILY TO CALL FOR INSTRUCTIONS.

## 2018-09-07 ENCOUNTER — Other Ambulatory Visit: Payer: Self-pay

## 2018-09-07 DIAGNOSIS — I739 Peripheral vascular disease, unspecified: Secondary | ICD-10-CM

## 2018-09-07 DIAGNOSIS — Z01818 Encounter for other preprocedural examination: Secondary | ICD-10-CM

## 2018-09-08 ENCOUNTER — Ambulatory Visit (INDEPENDENT_AMBULATORY_CARE_PROVIDER_SITE_OTHER)
Admission: RE | Admit: 2018-09-08 | Discharge: 2018-09-08 | Disposition: A | Discharge status: Home / Self Care | Payer: Medicare HMO | Source: Ambulatory Visit | Attending: Vascular Surgery | Admitting: Vascular Surgery

## 2018-09-08 ENCOUNTER — Ambulatory Visit (HOSPITAL_COMMUNITY)
Admission: RE | Admit: 2018-09-08 | Discharge: 2018-09-08 | Disposition: A | Discharge status: Home / Self Care | Payer: Medicare HMO | Source: Ambulatory Visit | Attending: Vascular Surgery | Admitting: Vascular Surgery

## 2018-09-08 DIAGNOSIS — I10 Essential (primary) hypertension: Secondary | ICD-10-CM | POA: Insufficient documentation

## 2018-09-08 DIAGNOSIS — I70203 Unspecified atherosclerosis of native arteries of extremities, bilateral legs: Secondary | ICD-10-CM | POA: Diagnosis not present

## 2018-09-08 DIAGNOSIS — Z01818 Encounter for other preprocedural examination: Secondary | ICD-10-CM

## 2018-09-08 DIAGNOSIS — Z8673 Personal history of transient ischemic attack (TIA), and cerebral infarction without residual deficits: Secondary | ICD-10-CM | POA: Diagnosis not present

## 2018-09-08 DIAGNOSIS — I708 Atherosclerosis of other arteries: Secondary | ICD-10-CM | POA: Diagnosis not present

## 2018-09-08 DIAGNOSIS — I739 Peripheral vascular disease, unspecified: Secondary | ICD-10-CM

## 2018-09-08 DIAGNOSIS — R9389 Abnormal findings on diagnostic imaging of other specified body structures: Secondary | ICD-10-CM | POA: Diagnosis not present

## 2018-09-08 DIAGNOSIS — R0989 Other specified symptoms and signs involving the circulatory and respiratory systems: Secondary | ICD-10-CM | POA: Diagnosis present

## 2018-09-08 DIAGNOSIS — I700 Atherosclerosis of aorta: Secondary | ICD-10-CM | POA: Insufficient documentation

## 2018-09-09 ENCOUNTER — Encounter (HOSPITAL_COMMUNITY)
Admission: RE | Disposition: A | Discharge status: Home / Self Care | Payer: Self-pay | Source: Ambulatory Visit | Attending: Vascular Surgery

## 2018-09-09 ENCOUNTER — Ambulatory Visit (HOSPITAL_COMMUNITY)
Admission: RE | Admit: 2018-09-09 | Discharge: 2018-09-09 | Disposition: A | Discharge status: Home / Self Care | Payer: Medicare HMO | Source: Ambulatory Visit | Attending: Vascular Surgery | Admitting: Vascular Surgery

## 2018-09-09 ENCOUNTER — Encounter (HOSPITAL_COMMUNITY): Payer: Self-pay | Admitting: Vascular Surgery

## 2018-09-09 DIAGNOSIS — Z7982 Long term (current) use of aspirin: Secondary | ICD-10-CM | POA: Diagnosis not present

## 2018-09-09 DIAGNOSIS — I700 Atherosclerosis of aorta: Secondary | ICD-10-CM | POA: Diagnosis not present

## 2018-09-09 DIAGNOSIS — I10 Essential (primary) hypertension: Secondary | ICD-10-CM | POA: Insufficient documentation

## 2018-09-09 DIAGNOSIS — Q2549 Other congenital malformations of aorta: Secondary | ICD-10-CM | POA: Diagnosis not present

## 2018-09-09 DIAGNOSIS — Z7902 Long term (current) use of antithrombotics/antiplatelets: Secondary | ICD-10-CM | POA: Diagnosis not present

## 2018-09-09 DIAGNOSIS — G4733 Obstructive sleep apnea (adult) (pediatric): Secondary | ICD-10-CM | POA: Diagnosis not present

## 2018-09-09 DIAGNOSIS — I6502 Occlusion and stenosis of left vertebral artery: Secondary | ICD-10-CM | POA: Diagnosis not present

## 2018-09-09 DIAGNOSIS — I6523 Occlusion and stenosis of bilateral carotid arteries: Secondary | ICD-10-CM

## 2018-09-09 DIAGNOSIS — Z981 Arthrodesis status: Secondary | ICD-10-CM | POA: Diagnosis not present

## 2018-09-09 DIAGNOSIS — M109 Gout, unspecified: Secondary | ICD-10-CM | POA: Diagnosis not present

## 2018-09-09 HISTORY — PX: CAROTID ANGIOGRAPHY: CATH118230

## 2018-09-09 HISTORY — PX: AORTIC ARCH ANGIOGRAPHY: CATH118224

## 2018-09-09 LAB — POCT I-STAT, CHEM 8
BUN: 41 mg/dL — ABNORMAL HIGH (ref 8–23)
CREATININE: 1.7 mg/dL — AB (ref 0.61–1.24)
Calcium, Ion: 1.2 mmol/L (ref 1.15–1.40)
Chloride: 104 mmol/L (ref 98–111)
GLUCOSE: 124 mg/dL — AB (ref 70–99)
HCT: 47 % (ref 39.0–52.0)
Hemoglobin: 16 g/dL (ref 13.0–17.0)
Potassium: 5.3 mmol/L — ABNORMAL HIGH (ref 3.5–5.1)
Sodium: 140 mmol/L (ref 135–145)
TCO2: 31 mmol/L (ref 22–32)

## 2018-09-09 LAB — POCT ACTIVATED CLOTTING TIME: Activated Clotting Time: 158 seconds

## 2018-09-09 SURGERY — CAROTID ANGIOGRAPHY
Anesthesia: LOCAL

## 2018-09-09 MED ORDER — HYDRALAZINE HCL 20 MG/ML IJ SOLN
INTRAMUSCULAR | Status: DC | PRN
  Administered 2018-09-09 (×2): 10 mg via INTRAVENOUS

## 2018-09-09 MED ORDER — HEPARIN (PORCINE) IN NACL 1000-0.9 UT/500ML-% IV SOLN
INTRAVENOUS | Status: DC | PRN
  Administered 2018-09-09 (×2): 500 mL

## 2018-09-09 MED ORDER — HEPARIN SODIUM (PORCINE) 1000 UNIT/ML IJ SOLN
INTRAMUSCULAR | Status: AC
  Filled 2018-09-09: qty 1

## 2018-09-09 MED ORDER — LIDOCAINE HCL (PF) 1 % IJ SOLN
INTRAMUSCULAR | Status: DC | PRN
  Administered 2018-09-09: 15 mL

## 2018-09-09 MED ORDER — LIDOCAINE HCL (PF) 1 % IJ SOLN
INTRAMUSCULAR | Status: AC
  Filled 2018-09-09: qty 30

## 2018-09-09 MED ORDER — HEPARIN (PORCINE) IN NACL 1000-0.9 UT/500ML-% IV SOLN
INTRAVENOUS | Status: AC
  Filled 2018-09-09: qty 1000

## 2018-09-09 MED ORDER — HEPARIN SODIUM (PORCINE) 1000 UNIT/ML IJ SOLN
INTRAMUSCULAR | Status: DC | PRN
  Administered 2018-09-09: 3000 [IU] via INTRAVENOUS

## 2018-09-09 MED ORDER — SODIUM CHLORIDE 0.9 % IV SOLN: INTRAVENOUS | Status: AC

## 2018-09-09 MED ORDER — SODIUM CHLORIDE 0.9 % IV SOLN
INTRAVENOUS | Status: DC
  Administered 2018-09-09: 10:00:00 via INTRAVENOUS

## 2018-09-09 MED ORDER — MORPHINE SULFATE (PF) 10 MG/ML IV SOLN: 1.0000 mg | INTRAVENOUS | Status: DC | PRN

## 2018-09-09 MED ORDER — HYDRALAZINE HCL 20 MG/ML IJ SOLN
INTRAMUSCULAR | Status: AC
  Filled 2018-09-09: qty 1

## 2018-09-09 MED ORDER — IODIXANOL 320 MG/ML IV SOLN
INTRAVENOUS | Status: DC | PRN
  Administered 2018-09-09: 69 mL via INTRA_ARTERIAL

## 2018-09-09 SURGICAL SUPPLY — 12 items
CATH ANGIO 5F BER2 100CM (CATHETERS) ×3 IMPLANT
CATH ANGIO 5F PIGTAIL 100CM (CATHETERS) ×3 IMPLANT
CATH ANGIO 5F SIM1 100CM (CATHETERS) ×3 IMPLANT
KIT PV (KITS) ×3 IMPLANT
NEEDLE 18GX3-1/2 9CM (NEEDLE) ×3 IMPLANT
SHEATH PINNACLE 5F 10CM (SHEATH) ×3 IMPLANT
SHEATH PROBE COVER 6X72 (BAG) ×6 IMPLANT
STOPCOCK MORSE 400PSI 3WAY (MISCELLANEOUS) ×3 IMPLANT
SYR MEDRAD MARK V 150ML (SYRINGE) ×3 IMPLANT
TRANSDUCER W/STOPCOCK (MISCELLANEOUS) ×3 IMPLANT
TRAY PV CATH (CUSTOM PROCEDURE TRAY) ×3 IMPLANT
WIRE HITORQ VERSACORE ST 145CM (WIRE) ×3 IMPLANT

## 2018-09-09 NOTE — Discharge Instructions (Signed)

## 2018-09-09 NOTE — Interval H&P Note (Signed)
History and Physical Interval Note:  09/09/2018 11:31 AM  Gregory [name]  has presented today for surgery, with the diagnosis of bilateral carotid artery stenosis  The various methods of treatment have been discussed with the patient and family. After consideration of risks, benefits and other options for treatment, the patient has consented to  Procedure(s): CAROTID ANGIOGRAPHY (N/A) AORTIC ARCH ANGIOGRAPHY (N/A) as a surgical intervention .  The patient's history has been reviewed, patient examined, no change in status, stable for surgery.  I have reviewed the patient's chart and labs.  Questions were answered to the patient's satisfaction.     Fabienne Brunsharles Jessice Madill

## 2018-09-09 NOTE — Progress Notes (Signed)
Lower extremity dorseflexion equally strong. Smile symmetrical. Alert and oriented

## 2018-09-09 NOTE — Op Note (Signed)
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  The patient was placed in supine position on the operating table.  Right common femoral artery was identified although vaguely secondary to the patient's obesity.  An ultrasound confirmed that the common femoral artery was patent.  A permanent image was obtained placed in the medical record.  After several attempts I was able to successfully cannulate the right common femoral artery.  The artery was cannulated on 3 separate occasions and initially I could not get the guidewire to advance.  This was primarily due to the patient's pannus pulling the needle out of the artery.  I was finally able under fluoroscopy to cannulate the right common femoral artery and advanced an 035 versacore wire up in the abdominal aorta under fluoroscopic guidance.  A 5 French sheath was placed over the guidewire in the right common femoral artery.  This was thoroughly flushed with heparinized saline.  A 5 French pigtail catheter was then placed over the guidewire and these were advanced as a unit into the ascending aorta.  Arch aortogram was then obtained in a 40 degree LAO projection.  Patient has a bovine arch.  The left common carotid artery comes off the innominate about 3 cm after its origin.  The origin of the left common right common right subclavian left subclavian arteries are all widely patent.  The vertebral arteries are small but patent with antegrade flow.  At this point the pico catheter was removed over guidewire and exchanged for a  Berenstein 2 catheter.  This was used to selectively catheterize first the innominate followed by the right common carotid artery.  Right carotid angiogram was obtained.  Intracranial views were not performed due to the patient having an elevated creatinine and he had already had a previous CT Angio of the head.  This would save his contrast.  Several oblique views had to be performed on the right side in addition to the lateral AP view due to overlying orthopedic hardware.  Using NASCET criteria there is a 5% stenosis of the right internal carotid artery.  At this point the Berenstein catheter was pulled back and exchanged for a Simmons 1 catheter.  This was performed up in the aortic arch and then used to selectively catheterize the left common carotid through the innominate.  Left carotid angiogram was then obtained again with multiple oblique views due to overlying orthopedic hardware.  On the left side there is a 50% stenosis of the left internal carotid artery by NASCET criteria.  Again intracranial views were not performed to limit contrast.  At this point the [facility] catheter was removed over guidewire.  The 5 French sheath was left in place to be pulled in the holding area since the patient had received 3000 units of heparin after the arch aortogram.  Patient tolerated the procedure well and there were no complications.  The patient was taken to holding area in stable condition.  Operative management: The patient has less than 50% stenosis of his internal carotid artery bilaterally.  Maximal medical management will be his treatment for now.  He may follow-up with [place] on an as-needed basis.  Fabienne Bruns, MD [facility] Office: [phone] Pager: [phone]

## 2018-09-09 NOTE — Progress Notes (Signed)
Thanks. Agree with plan 

## 2018-09-09 NOTE — Progress Notes (Signed)
Site area: Rt groin fa sheath pulled and pressure held by Tia AlertJulie Pender,RN Site Prior to Removal:  Level 0 Pressure Applied For: 20 minutes Manual:   yes Patient Status During Pull:  stable Post Pull Site:  Level 0 Post Pull Instructions Given:  yes Post Pull Pulses Present: rt pt dopplered Dressing Applied:  Gauze and tegaderm Bedrest begins @ 1332 Comments:

## 2018-09-09 NOTE — Progress Notes (Signed)
Victorino DikeJennifer O'Neal,RN notified of elevated BUN and creat and she will notify Dr Darrick PennaFields

## 2019-04-05 ENCOUNTER — Emergency Department (HOSPITAL_COMMUNITY): Payer: Medicare HMO

## 2019-04-05 ENCOUNTER — Other Ambulatory Visit (HOSPITAL_COMMUNITY): Payer: Medicare HMO

## 2019-04-05 ENCOUNTER — Inpatient Hospital Stay (HOSPITAL_COMMUNITY)
Admission: EM | Admit: 2019-04-05 | Discharge: 2019-04-10 | DRG: 040 | Disposition: A | Discharge status: Rehabilitation Facility | Payer: Medicare HMO | Attending: Family Medicine | Admitting: Family Medicine

## 2019-04-05 ENCOUNTER — Inpatient Hospital Stay (HOSPITAL_COMMUNITY): Payer: Medicare HMO

## 2019-04-05 ENCOUNTER — Encounter (HOSPITAL_COMMUNITY): Payer: Self-pay | Admitting: Cardiology

## 2019-04-05 DIAGNOSIS — I69351 Hemiplegia and hemiparesis following cerebral infarction affecting right dominant side: Secondary | ICD-10-CM | POA: Diagnosis not present

## 2019-04-05 DIAGNOSIS — I639 Cerebral infarction, unspecified: Secondary | ICD-10-CM | POA: Diagnosis not present

## 2019-04-05 DIAGNOSIS — N183 Chronic kidney disease, stage 3 unspecified: Secondary | ICD-10-CM | POA: Diagnosis present

## 2019-04-05 DIAGNOSIS — I471 Supraventricular tachycardia: Secondary | ICD-10-CM | POA: Diagnosis present

## 2019-04-05 DIAGNOSIS — I6389 Other cerebral infarction: Secondary | ICD-10-CM | POA: Diagnosis not present

## 2019-04-05 DIAGNOSIS — I340 Nonrheumatic mitral (valve) insufficiency: Secondary | ICD-10-CM | POA: Diagnosis not present

## 2019-04-05 DIAGNOSIS — I672 Cerebral atherosclerosis: Secondary | ICD-10-CM | POA: Diagnosis present

## 2019-04-05 DIAGNOSIS — R1312 Dysphagia, oropharyngeal phase: Secondary | ICD-10-CM | POA: Diagnosis present

## 2019-04-05 DIAGNOSIS — I248 Other forms of acute ischemic heart disease: Secondary | ICD-10-CM | POA: Diagnosis present

## 2019-04-05 DIAGNOSIS — E875 Hyperkalemia: Secondary | ICD-10-CM | POA: Diagnosis present

## 2019-04-05 DIAGNOSIS — E861 Hypovolemia: Secondary | ICD-10-CM | POA: Diagnosis present

## 2019-04-05 DIAGNOSIS — I4891 Unspecified atrial fibrillation: Secondary | ICD-10-CM | POA: Diagnosis present

## 2019-04-05 DIAGNOSIS — R7303 Prediabetes: Secondary | ICD-10-CM | POA: Diagnosis not present

## 2019-04-05 DIAGNOSIS — I6300 Cerebral infarction due to thrombosis of unspecified precerebral artery: Secondary | ICD-10-CM | POA: Diagnosis not present

## 2019-04-05 DIAGNOSIS — R338 Other retention of urine: Secondary | ICD-10-CM | POA: Diagnosis present

## 2019-04-05 DIAGNOSIS — I2510 Atherosclerotic heart disease of native coronary artery without angina pectoris: Secondary | ICD-10-CM | POA: Diagnosis present

## 2019-04-05 DIAGNOSIS — R4781 Slurred speech: Secondary | ICD-10-CM | POA: Diagnosis present

## 2019-04-05 DIAGNOSIS — I454 Nonspecific intraventricular block: Secondary | ICD-10-CM | POA: Diagnosis present

## 2019-04-05 DIAGNOSIS — I129 Hypertensive chronic kidney disease with stage 1 through stage 4 chronic kidney disease, or unspecified chronic kidney disease: Secondary | ICD-10-CM | POA: Diagnosis present

## 2019-04-05 DIAGNOSIS — D649 Anemia, unspecified: Secondary | ICD-10-CM | POA: Diagnosis not present

## 2019-04-05 DIAGNOSIS — Z6833 Body mass index (BMI) 33.0-33.9, adult: Secondary | ICD-10-CM

## 2019-04-05 DIAGNOSIS — N320 Bladder-neck obstruction: Secondary | ICD-10-CM | POA: Diagnosis present

## 2019-04-05 DIAGNOSIS — M109 Gout, unspecified: Secondary | ICD-10-CM | POA: Diagnosis present

## 2019-04-05 DIAGNOSIS — Z6832 Body mass index (BMI) 32.0-32.9, adult: Secondary | ICD-10-CM | POA: Diagnosis not present

## 2019-04-05 DIAGNOSIS — E874 Mixed disorder of acid-base balance: Secondary | ICD-10-CM | POA: Diagnosis present

## 2019-04-05 DIAGNOSIS — E785 Hyperlipidemia, unspecified: Secondary | ICD-10-CM | POA: Diagnosis present

## 2019-04-05 DIAGNOSIS — I160 Hypertensive urgency: Secondary | ICD-10-CM | POA: Diagnosis present

## 2019-04-05 DIAGNOSIS — N401 Enlarged prostate with lower urinary tract symptoms: Secondary | ICD-10-CM | POA: Diagnosis present

## 2019-04-05 DIAGNOSIS — R4689 Other symptoms and signs involving appearance and behavior: Secondary | ICD-10-CM | POA: Diagnosis present

## 2019-04-05 DIAGNOSIS — Z7902 Long term (current) use of antithrombotics/antiplatelets: Secondary | ICD-10-CM

## 2019-04-05 DIAGNOSIS — N179 Acute kidney failure, unspecified: Secondary | ICD-10-CM | POA: Diagnosis present

## 2019-04-05 DIAGNOSIS — I63412 Cerebral infarction due to embolism of left middle cerebral artery: Principal | ICD-10-CM | POA: Diagnosis present

## 2019-04-05 DIAGNOSIS — D62 Acute posthemorrhagic anemia: Secondary | ICD-10-CM | POA: Diagnosis not present

## 2019-04-05 DIAGNOSIS — I69391 Dysphagia following cerebral infarction: Secondary | ICD-10-CM | POA: Diagnosis not present

## 2019-04-05 DIAGNOSIS — I6523 Occlusion and stenosis of bilateral carotid arteries: Secondary | ICD-10-CM | POA: Diagnosis present

## 2019-04-05 DIAGNOSIS — N136 Pyonephrosis: Secondary | ICD-10-CM | POA: Diagnosis present

## 2019-04-05 DIAGNOSIS — R4701 Aphasia: Secondary | ICD-10-CM | POA: Diagnosis present

## 2019-04-05 DIAGNOSIS — I4819 Other persistent atrial fibrillation: Secondary | ICD-10-CM | POA: Diagnosis not present

## 2019-04-05 DIAGNOSIS — I214 Non-ST elevation (NSTEMI) myocardial infarction: Secondary | ICD-10-CM | POA: Diagnosis present

## 2019-04-05 DIAGNOSIS — Z7982 Long term (current) use of aspirin: Secondary | ICD-10-CM

## 2019-04-05 DIAGNOSIS — E870 Hyperosmolality and hypernatremia: Secondary | ICD-10-CM | POA: Diagnosis not present

## 2019-04-05 DIAGNOSIS — I10 Essential (primary) hypertension: Secondary | ICD-10-CM | POA: Diagnosis present

## 2019-04-05 DIAGNOSIS — Z8673 Personal history of transient ischemic attack (TIA), and cerebral infarction without residual deficits: Secondary | ICD-10-CM

## 2019-04-05 DIAGNOSIS — Z79899 Other long term (current) drug therapy: Secondary | ICD-10-CM

## 2019-04-05 DIAGNOSIS — I69320 Aphasia following cerebral infarction: Secondary | ICD-10-CM | POA: Diagnosis not present

## 2019-04-05 DIAGNOSIS — I739 Peripheral vascular disease, unspecified: Secondary | ICD-10-CM | POA: Diagnosis present

## 2019-04-05 DIAGNOSIS — R1310 Dysphagia, unspecified: Secondary | ICD-10-CM | POA: Diagnosis not present

## 2019-04-05 DIAGNOSIS — G4733 Obstructive sleep apnea (adult) (pediatric): Secondary | ICD-10-CM | POA: Diagnosis present

## 2019-04-05 DIAGNOSIS — G8191 Hemiplegia, unspecified affecting right dominant side: Secondary | ICD-10-CM | POA: Diagnosis present

## 2019-04-05 DIAGNOSIS — R29725 NIHSS score 25: Secondary | ICD-10-CM | POA: Diagnosis present

## 2019-04-05 HISTORY — DX: Acute kidney failure, unspecified: N17.9

## 2019-04-05 HISTORY — DX: Essential (primary) hypertension: I10

## 2019-04-05 HISTORY — DX: Peripheral vascular disease, unspecified: I73.9

## 2019-04-05 HISTORY — DX: Cerebral infarction, unspecified: I63.9

## 2019-04-05 HISTORY — DX: Hyperlipidemia, unspecified: E78.5

## 2019-04-05 HISTORY — DX: Chronic kidney disease, stage 2 (mild): N18.2

## 2019-04-05 LAB — COMPREHENSIVE METABOLIC PANEL
ALT: 24 U/L (ref 0–44)
AST: 43 U/L — ABNORMAL HIGH (ref 15–41)
Albumin: 3.9 g/dL (ref 3.5–5.0)
Alkaline Phosphatase: 133 U/L — ABNORMAL HIGH (ref 38–126)
Anion gap: 16 — ABNORMAL HIGH (ref 5–15)
BUN: 158 mg/dL — ABNORMAL HIGH (ref 8–23)
CO2: 10 mmol/L — ABNORMAL LOW (ref 22–32)
Calcium: 9.1 mg/dL (ref 8.9–10.3)
Chloride: 112 mmol/L — ABNORMAL HIGH (ref 98–111)
Creatinine, Ser: 10.48 mg/dL — ABNORMAL HIGH (ref 0.61–1.24)
GFR calc Af Amer: 5 mL/min — ABNORMAL LOW (ref >60)
GFR calc non Af Amer: 4 mL/min — ABNORMAL LOW (ref >60)
Glucose, Bld: 115 mg/dL — ABNORMAL HIGH (ref 70–99)
Potassium: 6.8 mmol/L (ref 3.5–5.1)
Sodium: 138 mmol/L (ref 135–145)
Total Bilirubin: 0.6 mg/dL (ref 0.3–1.2)
Total Protein: 7.8 g/dL (ref 6.5–8.1)

## 2019-04-05 LAB — POCT I-STAT TROPONIN I: Troponin i, poc: 3.99 ng/mL (ref 0.00–0.08)

## 2019-04-05 LAB — CBC
HCT: 36.3 % — ABNORMAL LOW (ref 39.0–52.0)
Hemoglobin: 10.5 g/dL — ABNORMAL LOW (ref 13.0–17.0)
MCH: 28.3 pg (ref 26.0–34.0)
MCHC: 28.9 g/dL — ABNORMAL LOW (ref 30.0–36.0)
MCV: 97.8 fL (ref 80.0–100.0)
Platelets: 179 10*3/uL (ref 150–400)
RBC: 3.71 MIL/uL — ABNORMAL LOW (ref 4.22–5.81)
RDW: 13.4 % (ref 11.5–15.5)
WBC: 8.8 10*3/uL (ref 4.0–10.5)
nRBC: 0 % (ref 0.0–0.2)

## 2019-04-05 LAB — BASIC METABOLIC PANEL
Anion gap: 15 (ref 5–15)
BUN: 158 mg/dL — ABNORMAL HIGH (ref 8–23)
CO2: 10 mmol/L — ABNORMAL LOW (ref 22–32)
Calcium: 9 mg/dL (ref 8.9–10.3)
Chloride: 113 mmol/L — ABNORMAL HIGH (ref 98–111)
Creatinine, Ser: 10.14 mg/dL — ABNORMAL HIGH (ref 0.61–1.24)
GFR calc Af Amer: 5 mL/min — ABNORMAL LOW (ref >60)
GFR calc non Af Amer: 5 mL/min — ABNORMAL LOW (ref >60)
Glucose, Bld: 131 mg/dL — ABNORMAL HIGH (ref 70–99)
Potassium: 5.9 mmol/L — ABNORMAL HIGH (ref 3.5–5.1)
Sodium: 138 mmol/L (ref 135–145)

## 2019-04-05 LAB — DIFFERENTIAL
Abs Immature Granulocytes: 0.02 10*3/uL (ref 0.00–0.07)
Basophils Absolute: 0 10*3/uL (ref 0.0–0.1)
Basophils Relative: 1 %
Eosinophils Absolute: 0.1 10*3/uL (ref 0.0–0.5)
Eosinophils Relative: 1 %
Immature Granulocytes: 0 %
Lymphocytes Relative: 13 %
Lymphs Abs: 1.2 10*3/uL (ref 0.7–4.0)
Monocytes Absolute: 0.7 10*3/uL (ref 0.1–1.0)
Monocytes Relative: 7 %
Neutro Abs: 6.9 10*3/uL (ref 1.7–7.7)
Neutrophils Relative %: 78 %

## 2019-04-05 LAB — CBG MONITORING, ED
Glucose-Capillary: 95 mg/dL (ref 70–99)
Glucose-Capillary: 102 mg/dL — ABNORMAL HIGH (ref 70–99)

## 2019-04-05 LAB — URINALYSIS, ROUTINE W REFLEX MICROSCOPIC
Bilirubin Urine: NEGATIVE
Glucose, UA: NEGATIVE mg/dL
Ketones, ur: NEGATIVE mg/dL
Nitrite: NEGATIVE
Protein, ur: NEGATIVE mg/dL
Specific Gravity, Urine: 1.011 (ref 1.005–1.030)
pH: 5 (ref 5.0–8.0)

## 2019-04-05 LAB — ECHOCARDIOGRAM COMPLETE: Weight: 3132.3 oz

## 2019-04-05 LAB — CREATININE, URINE, RANDOM: Creatinine, Urine: 154.38 mg/dL

## 2019-04-05 LAB — IRON AND TIBC
Iron: 121 ug/dL (ref 45–182)
Saturation Ratios: 61 % — ABNORMAL HIGH (ref 17.9–39.5)
TIBC: 199 ug/dL — ABNORMAL LOW (ref 250–450)
UIBC: 78 ug/dL

## 2019-04-05 LAB — FERRITIN: Ferritin: 509 ng/mL — ABNORMAL HIGH (ref 24–336)

## 2019-04-05 LAB — PROTIME-INR
INR: 1.1 (ref 0.8–1.2)
Prothrombin Time: 14.4 seconds (ref 11.4–15.2)

## 2019-04-05 LAB — SODIUM, URINE, RANDOM: Sodium, Ur: 50 mmol/L

## 2019-04-05 LAB — TROPONIN I
Troponin I: 3.29 ng/mL (ref <0.03)
Troponin I: 10.69 ng/mL (ref <0.03)

## 2019-04-05 LAB — CK: Total CK: 504 U/L — ABNORMAL HIGH (ref 49–397)

## 2019-04-05 LAB — APTT: aPTT: 27 seconds (ref 24–36)

## 2019-04-05 LAB — I-STAT CREATININE, ED: Creatinine, Ser: 10.9 mg/dL — ABNORMAL HIGH (ref 0.61–1.24)

## 2019-04-05 LAB — AMMONIA: Ammonia: 31 umol/L (ref 9–35)

## 2019-04-05 MED ORDER — METOPROLOL TARTRATE 5 MG/5ML IV SOLN
2.5000 mg | Freq: Four times a day (QID) | INTRAVENOUS | Status: DC
  Administered 2019-04-05 – 2019-04-06 (×3): 2.5 mg via INTRAVENOUS
  Filled 2019-04-05 (×3): qty 5

## 2019-04-05 MED ORDER — ACETAMINOPHEN 160 MG/5ML PO SOLN: 650.0000 mg | ORAL | Status: DC | PRN

## 2019-04-05 MED ORDER — ACETAMINOPHEN 325 MG PO TABS: 650.0000 mg | ORAL_TABLET | ORAL | Status: DC | PRN

## 2019-04-05 MED ORDER — STROKE: EARLY STAGES OF RECOVERY BOOK
Freq: Once | Status: AC
  Administered 2019-04-05: 18:00:00

## 2019-04-05 MED ORDER — METOPROLOL TARTRATE 5 MG/5ML IV SOLN
5.0000 mg | Freq: Once | INTRAVENOUS | Status: AC
  Administered 2019-04-05: 22:00:00 5 mg via INTRAVENOUS
  Filled 2019-04-05: qty 5

## 2019-04-05 MED ORDER — INSULIN ASPART 100 UNIT/ML IV SOLN
5.0000 [IU] | Freq: Once | INTRAVENOUS | Status: AC
  Administered 2019-04-05: 11:00:00 5 [IU] via INTRAVENOUS

## 2019-04-05 MED ORDER — STERILE WATER FOR INJECTION IV SOLN
INTRAVENOUS | Status: DC
  Administered 2019-04-05 – 2019-04-07 (×5): via INTRAVENOUS
  Filled 2019-04-05 (×13): qty 850

## 2019-04-05 MED ORDER — SODIUM BICARBONATE 8.4 % IV SOLN
50.0000 meq | Freq: Once | INTRAVENOUS | Status: AC
  Administered 2019-04-05: 50 meq via INTRAVENOUS
  Filled 2019-04-05: qty 50

## 2019-04-05 MED ORDER — DEXTROSE 50 % IV SOLN
1.0000 | Freq: Once | INTRAVENOUS | Status: AC
  Administered 2019-04-05: 11:00:00 50 mL via INTRAVENOUS
  Filled 2019-04-05: qty 50

## 2019-04-05 MED ORDER — SODIUM ZIRCONIUM CYCLOSILICATE 10 G PO PACK
10.0000 g | PACK | Freq: Four times a day (QID) | ORAL | Status: DC
  Filled 2019-04-05 (×10): qty 1

## 2019-04-05 MED ORDER — HEPARIN SODIUM (PORCINE) 5000 UNIT/ML IJ SOLN
5000.0000 [IU] | Freq: Three times a day (TID) | INTRAMUSCULAR | Status: DC
  Administered 2019-04-05 – 2019-04-06 (×3): 5000 [IU] via SUBCUTANEOUS
  Filled 2019-04-05 (×3): qty 1

## 2019-04-05 MED ORDER — SODIUM CHLORIDE 0.9 % IV SOLN: 1.0000 g | Freq: Once | INTRAVENOUS | Status: DC

## 2019-04-05 MED ORDER — ALBUTEROL SULFATE (2.5 MG/3ML) 0.083% IN NEBU
10.0000 mg | INHALATION_SOLUTION | Freq: Once | RESPIRATORY_TRACT | Status: AC
  Administered 2019-04-05: 10 mg via RESPIRATORY_TRACT
  Filled 2019-04-05: qty 12

## 2019-04-05 MED ORDER — ACETAMINOPHEN 650 MG RE SUPP: 650.0000 mg | RECTAL | Status: DC | PRN

## 2019-04-05 MED ORDER — CALCIUM GLUCONATE-NACL 1-0.675 GM/50ML-% IV SOLN
1.0000 g | Freq: Once | INTRAVENOUS | Status: AC
  Administered 2019-04-05: 1000 mg via INTRAVENOUS
  Filled 2019-04-05 (×2): qty 50

## 2019-04-05 MED ORDER — ASPIRIN 325 MG PO TABS
325.0000 mg | ORAL_TABLET | Freq: Every day | ORAL | Status: DC
  Administered 2019-04-08 – 2019-04-10 (×3): 325 mg via ORAL
  Filled 2019-04-05 (×4): qty 1

## 2019-04-05 MED ORDER — SODIUM CHLORIDE 0.9% FLUSH
3.0000 mL | Freq: Once | INTRAVENOUS | Status: AC
  Administered 2019-04-05: 11:00:00 3 mL via INTRAVENOUS

## 2019-04-05 MED ORDER — ASPIRIN 300 MG RE SUPP
300.0000 mg | Freq: Every day | RECTAL | Status: DC
  Administered 2019-04-05 – 2019-04-07 (×3): 300 mg via RECTAL
  Filled 2019-04-05 (×3): qty 1

## 2019-04-05 NOTE — ED Notes (Signed)
DR Pilar Plate AWARE OF POTASSIUM OF 6.8

## 2019-04-05 NOTE — Progress Notes (Signed)
PT Cancellation Note  Patient Details Name: Gregory [name] MRN: [id number] DOB: 08-05-1951   Cancelled Treatment:    Reason Eval/Treat Not Completed: Medical issues which prohibited therapy. Pt with elevated troponin and potassium. PT will continue to f/u as available and appropriate.    Alessandra Bevels Lynnelle Mesmer 04/05/2019, 2:29 PM

## 2019-04-05 NOTE — Code Documentation (Signed)
68yo male arriving to [facility] via [facility] at [phone]. Patient from home where he was initially reported to be LKW at 0730. Patient was reported to develop difficulty speaking and EMS was called. EMS assessed right sided weakness and aphasia and activated a code stroke. Stroke team at the bedside on patient arrival. Labs drawn and patient cleared for CT by Dr. Pilar Plate. Patient to CT with team. CT completed. Patient arrived with no IV access, multiple attempts were made to place PIV. Creatinine resulted at 10.48. NIHSS 25, see documentation for details and code stroke times. Patient is nonverbal with right hemianopsia, left gaze preference and right arm paralysis on exam. Family contacted via telephone and report patient was LKW last night. Outside window for treatment with tPA. Decision made to proceed to STAT MRI. Patient monitored during MRI, see vital signs. Patient placed on O2 via nasal cannula during MRI d/t O2 saturations at 88% which improved to 100% with oxygen administration. MRI and MRA completed per order. Patient transported to Trauma C in the ED. Patient is not an endovascular intervention candidate. Bedside handoff with ED RN Arline Asp.

## 2019-04-05 NOTE — Progress Notes (Signed)
Echocardiogram 2D Echocardiogram has been performed.  Gregory [name] 04/05/2019, 2:32 PM

## 2019-04-05 NOTE — Progress Notes (Signed)
Patient off the floor for a test. 

## 2019-04-05 NOTE — ED Triage Notes (Signed)
Per wife pt was normal at 0730 with rt sided weakness, pt non verbal , upon further questioning of wife LSN was last night , she did not know meds no iv acess on arrival

## 2019-04-05 NOTE — H&P (Signed)
History and Physical    Gregory [name] EAV:[id number] DOB: 1951-11-10 DOA: 04/05/2019  I have briefly reviewed the patient's prior medical records in [facility]  PCP: System, Pcp Not In  Patient coming from: Home  Chief Complaint: Sudden onset right-sided weakness  HPI: Gregory [name] is a 68 y.o. male with medical history significant of chronic kidney disease stage III with baseline creatinine 1.6-1.7, hypertension, hyperlipidemia, who presents to the hospital with chief complaint of sudden onset right-sided weakness and inability to talk.  Patient is unable to talk to me so HPI as per ED report, basically patient was in his prior normal state of health apparently up until this morning when he was found unresponsive on the floor, unable to talk, unable to move his right side.  911 was called and he was brought to the emergency room.  Patient seems understand my question and is able to shake his head yes or no, he denies being in any pain right now, he denies any shortness of breath, he denies any fevers prior to this presentation.  ED Course: In the ED his vital signs are stable, he is afebrile normotensive.  He is satting well on room air.  Blood work revealed a creatinine of 10.8, BUN 158, potassium 6.8.  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  Neurology and nephrology were consulted and we are asked to admit  Review of Systems: As per HPI otherwise 10 point review of systems negative.   No past medical history on file.  Past Surgical History:  Procedure Laterality Date   AORTIC ARCH ANGIOGRAPHY N/A 09/09/2018   Procedure: AORTIC ARCH ANGIOGRAPHY;  Surgeon: Sherren Kerns, MD;  Location: [facility] INVASIVE CV LAB;  Service: Cardiovascular;  Laterality: N/A;   CAROTID ANGIOGRAPHY Bilateral 09/09/2018   Procedure: CAROTID ANGIOGRAPHY;  Surgeon: Sherren Kerns, MD;   Location: [facility] INVASIVE CV LAB;  Service: Cardiovascular;  Laterality: Bilateral;     has no history on file for tobacco, alcohol, and drug.  No Known Allergies  Unable to obtain family history due to inability to talk  Prior to Admission medications   Medication Sig Start Date End Date Taking? Authorizing Provider  allopurinol (ZYLOPRIM) 300 MG tablet Take 300 mg by mouth daily.    [provider]  aspirin EC 81 MG EC tablet Take 1 tablet (81 mg total) by mouth daily. 09/06/18   Lanelle Bal, MD  atorvastatin (LIPITOR) 80 MG tablet Take 1 tablet (80 mg total) by mouth daily at 6 PM. 09/05/18   Lanelle Bal, MD  clopidogrel (PLAVIX) 75 MG tablet Take 1 tablet (75 mg total) by mouth daily. 09/06/18   Lanelle Bal, MD  diclofenac (VOLTAREN) 75 MG EC tablet Take 75 mg by mouth at bedtime as needed for mild pain or moderate pain.    [provider]  hydrochlorothiazide (HYDRODIURIL) 50 MG tablet Take 50 mg by mouth daily.    [provider]  lisinopril (PRINIVIL,ZESTRIL) 40 MG tablet Take 40 mg by mouth daily.    [provider]    Physical Exam: Vitals:   04/05/19 1048 04/05/19 1115 04/05/19 1127 04/05/19 1145  BP: 130/84 131/68    Pulse: (!) 104 95 100   Resp: 14 (!) 25 15 (!) 21  Temp:      TempSrc:      SpO2: 100% 100% 100%  Weight:        Constitutional: NAD, calm, comfortable Eyes: PERRL, lids and conjunctivae normal Neck: normal, supple Respiratory: clear to auscultation bilaterally, no wheezing, no crackles. Normal respiratory effort. No accessory muscle use.  Cardiovascular: Regular rate and rhythm, no murmurs / rubs / gallops. No extremity edema. 2+ pedal pulses.  Abdomen: no tenderness, no masses palpated. Bowel sounds positive.  Musculoskeletal: no clubbing / cyanosis.  Skin: no rashes Neurologic: Right-sided weakness present, 5 out of 5 on left. Psychiatric: Unable to fully assess  Labs on Admission: I have  personally reviewed following labs and imaging studies  CBC: Recent Labs  Lab 04/05/19 0946  WBC 8.8  NEUTROABS 6.9  HGB 10.5*  HCT 36.3*  MCV 97.8  PLT 179   Basic Metabolic Panel: Recent Labs  Lab 04/05/19 0946 04/05/19 0953  NA 138  --   K 6.8*  --   CL 112*  --   CO2 10*  --   GLUCOSE 115*  --   BUN 158*  --   CREATININE 10.48* 10.90*  CALCIUM 9.1  --    GFR: CrCl cannot be calculated (Unknown ideal weight.). Liver Function Tests: Recent Labs  Lab 04/05/19 0946  AST 43*  ALT 24  ALKPHOS 133*  BILITOT 0.6  PROT 7.8  ALBUMIN 3.9   No results for input(s): LIPASE, AMYLASE in the last 168 hours. No results for input(s): AMMONIA in the last 168 hours. Coagulation Profile: Recent Labs  Lab 04/05/19 0946  INR 1.1   Cardiac Enzymes: No results for input(s): CKTOTAL, CKMB, CKMBINDEX, TROPONINI in the last 168 hours. BNP (last 3 results) No results for input(s): PROBNP in the last 8760 hours. HbA1C: No results for input(s): HGBA1C in the last 72 hours. CBG: Recent Labs  Lab 04/05/19 0947 04/05/19 1120  GLUCAP 95 102*   Lipid Profile: No results for input(s): CHOL, HDL, LDLCALC, TRIG, CHOLHDL, LDLDIRECT in the last 72 hours. Thyroid Function Tests: No results for input(s): TSH, T4TOTAL, FREET4, T3FREE, THYROIDAB in the last 72 hours. Anemia Panel: No results for input(s): VITAMINB12, FOLATE, FERRITIN, TIBC, IRON, RETICCTPCT in the last 72 hours. Urine analysis:    Component Value Date/Time   LABSPEC 1.015 04/23/2010 1049   PHURINE 6.0 04/23/2010 1049   GLUCOSEU NEGATIVE 04/23/2010 1049   HGBUR LARGE (A) 04/23/2010 1049   BILIRUBINUR NEGATIVE 04/23/2010 1049   KETONESUR NEGATIVE 04/23/2010 1049   PROTEINUR 100 (A) 04/23/2010 1049   UROBILINOGEN 0.2 04/23/2010 1049   NITRITE POSITIVE (A) 04/23/2010 1049   LEUKOCYTESUR (A) 04/23/2010 1049    LARGE Biochemical Testing Only. Please order routine urinalysis from main lab if confirmatory testing is  needed.     Radiological Exams on Admission: Mr Shirlee LatchMra Head MVWo Contrast  Result Date: 04/05/2019 CLINICAL DATA:  Acute presentation with right-sided weakness and speech disturbance. EXAM: MRI HEAD WITHOUT CONTRAST MRA HEAD WITHOUT CONTRAST MRA NECK WITHOUT CONTRAST TECHNIQUE: Multiplanar, multiecho pulse sequences of the brain and surrounding structures were obtained without intravenous contrast. Angiographic images of the Circle of Willis were obtained using MRA technique without intravenous contrast. Angiographic images of the neck were obtained using MRA technique without intravenous contrast. Carotid stenosis measurements (when applicable) are obtained utilizing NASCET criteria, using the distal internal carotid diameter as the denominator. COMPARISON:  Head CT same day.  MRI 09/03/2018. FINDINGS: MRI HEAD FINDINGS Brain: Diffusion imaging confirms acute infarction in the left insula and posterior frontal region. The area of involvement measures about 3.5 cm in size.  No mass effect or visible hemorrhage. No hydrocephalus. Chronic small-vessel ischemic changes of the deep white matter seen elsewhere. Vascular: Major vessels at the base of the brain show flow. Skull and upper cervical spine: Negative Sinuses/Orbits: Clear/normal Other: None MRA HEAD FINDINGS Both internal carotid arteries are patent through the skull base and siphon regions with atherosclerotic narrowing and irregularity in the siphon regions. Supraclinoid internal carotid arteries are widely patent. The anterior and middle cerebral vessels appear to show flow without visible large or medium vessel occlusion. Distal branch vessels do show some atherosclerotic irregularity. The left vertebral artery supplies the basilar. No antegrade flow seen in the right vertebral artery. The basilar artery shows atherosclerotic narrowing and irregularity diffusely. Posterior circulation branch vessels are patent, with both posterior cerebral arteries  receiving most of there supply from the anterior circulation. There is long segment stenosis of the left P1 segment, but good flow seen distal to that. MRA NECK FINDINGS There is antegrade flow in both common carotid arteries. The study suffers from pronounced motion degradation. Antegrade flow is seen in both cervical internal carotid arteries, but accurate evaluation of the carotid bifurcations is not possible. There is antegrade flow in the left vertebral artery but none seen on the right. IMPRESSION: 3.5 cm region of acute infarction affecting the left insula and posterior frontal region. No evidence of gross hemorrhage. MR angiography of the neck markedly degraded by motion. Both internal carotid arteries show antegrade flow. No antegrade flow in the right vertebral artery. Intracranial MR angiography does not show any large or medium vessel occlusion. Widespread atherosclerotic irregularity of the more distal branch vessels. Electronically Signed   By: Paulina Fusi M.D.   On: 04/05/2019 10:59   [name] Neck Wo Contrast  Result Date: 04/05/2019 CLINICAL DATA:  Acute presentation with right-sided weakness and speech disturbance. EXAM: MRI HEAD WITHOUT CONTRAST MRA HEAD WITHOUT CONTRAST MRA NECK WITHOUT CONTRAST TECHNIQUE: Multiplanar, multiecho pulse sequences of the brain and surrounding structures were obtained without intravenous contrast. Angiographic images of the Circle of Willis were obtained using MRA technique without intravenous contrast. Angiographic images of the neck were obtained using MRA technique without intravenous contrast. Carotid stenosis measurements (when applicable) are obtained utilizing NASCET criteria, using the distal internal carotid diameter as the denominator. COMPARISON:  Head CT same day.  MRI 09/03/2018. FINDINGS: MRI HEAD FINDINGS Brain: Diffusion imaging confirms acute infarction in the left insula and posterior frontal region. The area of involvement measures about 3.5 cm  in size. No mass effect or visible hemorrhage. No hydrocephalus. Chronic small-vessel ischemic changes of the deep white matter seen elsewhere. Vascular: Major vessels at the base of the brain show flow. Skull and upper cervical spine: Negative Sinuses/Orbits: Clear/normal Other: None MRA HEAD FINDINGS Both internal carotid arteries are patent through the skull base and siphon regions with atherosclerotic narrowing and irregularity in the siphon regions. Supraclinoid internal carotid arteries are widely patent. The anterior and middle cerebral vessels appear to show flow without visible large or medium vessel occlusion. Distal branch vessels do show some atherosclerotic irregularity. The left vertebral artery supplies the basilar. No antegrade flow seen in the right vertebral artery. The basilar artery shows atherosclerotic narrowing and irregularity diffusely. Posterior circulation branch vessels are patent, with both posterior cerebral arteries receiving most of there supply from the anterior circulation. There is long segment stenosis of the left P1 segment, but good flow seen distal to that. MRA NECK FINDINGS There is antegrade flow in both common carotid arteries.  The study suffers from pronounced motion degradation. Antegrade flow is seen in both cervical internal carotid arteries, but accurate evaluation of the carotid bifurcations is not possible. There is antegrade flow in the left vertebral artery but none seen on the right. IMPRESSION: 3.5 cm region of acute infarction affecting the left insula and posterior frontal region. No evidence of gross hemorrhage. MR angiography of the neck markedly degraded by motion. Both internal carotid arteries show antegrade flow. No antegrade flow in the right vertebral artery. Intracranial MR angiography does not show any large or medium vessel occlusion. Widespread atherosclerotic irregularity of the more distal branch vessels. Electronically Signed   By: Paulina Fusi  M.D.   On: 04/05/2019 10:59   Mr Brain Wo Contrast  Result Date: 04/05/2019 CLINICAL DATA:  Acute presentation with right-sided weakness and speech disturbance. EXAM: MRI HEAD WITHOUT CONTRAST MRA HEAD WITHOUT CONTRAST MRA NECK WITHOUT CONTRAST TECHNIQUE: Multiplanar, multiecho pulse sequences of the brain and surrounding structures were obtained without intravenous contrast. Angiographic images of the Circle of Willis were obtained using MRA technique without intravenous contrast. Angiographic images of the neck were obtained using MRA technique without intravenous contrast. Carotid stenosis measurements (when applicable) are obtained utilizing NASCET criteria, using the distal internal carotid diameter as the denominator. COMPARISON:  Head CT same day.  MRI 09/03/2018. FINDINGS: MRI HEAD FINDINGS Brain: Diffusion imaging confirms acute infarction in the left insula and posterior frontal region. The area of involvement measures about 3.5 cm in size. No mass effect or visible hemorrhage. No hydrocephalus. Chronic small-vessel ischemic changes of the deep white matter seen elsewhere. Vascular: Major vessels at the base of the brain show flow. Skull and upper cervical spine: Negative Sinuses/Orbits: Clear/normal Other: None MRA HEAD FINDINGS Both internal carotid arteries are patent through the skull base and siphon regions with atherosclerotic narrowing and irregularity in the siphon regions. Supraclinoid internal carotid arteries are widely patent. The anterior and middle cerebral vessels appear to show flow without visible large or medium vessel occlusion. Distal branch vessels do show some atherosclerotic irregularity. The left vertebral artery supplies the basilar. No antegrade flow seen in the right vertebral artery. The basilar artery shows atherosclerotic narrowing and irregularity diffusely. Posterior circulation branch vessels are patent, with both posterior cerebral arteries receiving most of there  supply from the anterior circulation. There is long segment stenosis of the left P1 segment, but good flow seen distal to that. MRA NECK FINDINGS There is antegrade flow in both common carotid arteries. The study suffers from pronounced motion degradation. Antegrade flow is seen in both cervical internal carotid arteries, but accurate evaluation of the carotid bifurcations is not possible. There is antegrade flow in the left vertebral artery but none seen on the right. IMPRESSION: 3.5 cm region of acute infarction affecting the left insula and posterior frontal region. No evidence of gross hemorrhage. MR angiography of the neck markedly degraded by motion. Both internal carotid arteries show antegrade flow. No antegrade flow in the right vertebral artery. Intracranial MR angiography does not show any large or medium vessel occlusion. Widespread atherosclerotic irregularity of the more distal branch vessels. Electronically Signed   By: Paulina Fusi M.D.   On: 04/05/2019 10:59   Dg Chest Port 1 View  Result Date: 04/05/2019 CLINICAL DATA:  Altered behavior. EXAM: PORTABLE CHEST 1 VIEW COMPARISON:  None. FINDINGS: Lungs are adequately inflated and otherwise clear. Borderline cardiomegaly. Mediastinum is within normal. Fusion hardware over the lower cervical spine intact. Minimal degenerative change of the  spine. IMPRESSION: No acute findings. Electronically Signed   By: Elberta Fortis M.D.   On: 04/05/2019 11:32   Ct Head Code Stroke Wo Contrast  Result Date: 04/05/2019 CLINICAL DATA:  Code stroke. Right-sided weakness. Speech disturbance. Last seen normal 0730 hours. EXAM: CT HEAD WITHOUT CONTRAST TECHNIQUE: Contiguous axial images were obtained from the base of the skull through the vertex without intravenous contrast. COMPARISON:  09/03/2018 FINDINGS: Brain: No CT evidence of acute infarction. Chronic small-vessel ischemic changes of the cerebral hemispheric white matter. No mass lesion, hemorrhage,  hydrocephalus or extra-axial collection. Vascular: No definite hyperdense vessel. One could question slight hyperdensity in the left M1 segment. Skull: Negative Sinuses/Orbits: Clear/normal Other: None ASPECTS ([place] Stroke Program Early CT Score) - Ganglionic level infarction (caudate, lentiform nuclei, internal capsule, insula, M1-M3 cortex): 6 - Supraganglionic infarction (M4-M6 cortex): 3 Total score (0-10 with 10 being normal): 9 IMPRESSION: 1. Possible early gray-white differentiation loss in the insula on the left. Chronic small-vessel ischemic changes. 2. One could question slight hyperdensity in the left M1 segment. This is not definite. 3. ASPECTS is 9 4. These results were communicated to Dr. Amada Jupiter at 10:01 amon 4/15/2020by text page via the [facility] messaging system. Electronically Signed   By: Paulina Fusi M.D.   On: 04/05/2019 10:04    EKG: Independently reviewed.  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  Further work-up per neurology.  Re-stratify with lipid panel and A1c.  Currently he is n.p.o. pending swallow eval, continue aspirin 300 p.o. or PR -PT, OT, SLP, 2D echo -Allow permissive hypertension  Active Problems Acute renal failure with profound hyperkalemia -Patient received calcium gluconate, dextrose and insulin, sodium bicarb as well as nebulizer treatment.  Nephrology consulted by EDP, appreciate input. -Admit to stepdown, dialysis decision per nephrology -Hold home ACE inhibitors as well as hydrochlorothiazide  Hyperlipidemia -Lipid panel pending, hold Lipitor until he passes speech eval  Peripheral vascular disease -Hold Plavix until passes swallow eval   DVT prophylaxis:  Heparin Code Status: Presumed full code Family Communication: No family at bedside Disposition Plan: Admit to stepdown Consults called: Neurology, nephrology   Pamella Pert, MD, PhD [facility]  Contact via [email or web address]  [facility] Office Info P: [phone]  F: [phone]   04/05/2019, 12:15 PM

## 2019-04-05 NOTE — Consult Note (Signed)
Cardiology Consultation:   Patient ID: Gregory [name]; [id number]; [birth year]   Admit date: 04/05/2019 Date of Consult: 04/05/2019  Primary Care Provider: System, Pcp Not In Primary Cardiologist: New to [facility]  Patient Profile:   Gregory [name] is a 68 y.o. male with a hx of CKD stage III, HTN, HLD and a recently diagnosed with left insular ischemic CVA and known stenosis of left ICA who is being seen today for the evaluation of elevated troponin and abnormal EKG at the request of Dr. Elvera Lennox.  History of Present Illness:   Gregory [name] is a 67yo M with a hx as stated above who initially presented to [facility] on 04/05/2019 for sudden onset right-sided weakness and inability to talk. History obtained from chart review and RN discussion as the patient continues to have difficulty with verbal communcation. Pt was apparently in his usual state of health prior to this morning when he was found unresponsive on his floor, unable to talk or move by his son. EMS was called for transport to the ED for further evaluation.   In the ED, creatinine was found to be 10.8 and his K+ was 6.8. He underwent imaging which showed acute 3.5cm acute infarction in the left insula and posterior frontal region without hemorrhage.  A troponin was obtained that was markedly elevated at 3.29>3.99. EKG with ST and new, more pronounced ST depression in lateral leads as well as BBB. Neurology, nephrology and cardiology were consulted.   Neurology recommended risk stratification with HbA1c and lipid panel as well as echocardiogram, carotid dopplers and high dose ASA.   Pt was seen by nephrology give high creatinine and prior hx of CKD stage III. It appears that his baseline is around 1.6. He was noted to be on several renally excreted medications including lisinopril and HCTZ. He was given a fluid challenge with bicarb to assist with electrolyte imbalance. A renal ultrasound was ordered but has not yet been obtained and if  decompensation noted, will likely need CRRT.   Given the marked troponin elevation and EKG changes from prior tracings, cardiology was asked to evaluate. Given his inability to verbalized at this time, I spoke with the bedside RN who states that he responds to verbal cues with appropriate nodding. He denies any pain or discomfort. He is somnolent, but easy to arouse. He has no prior cardiac hx but has significant risk factors for acute coronary syndrome.    Past Medical History:  Diagnosis Date   AKI (acute kidney injury) (HCC)    CKD (chronic kidney disease) stage 2, GFR 60-89 ml/min    CVA (cerebral vascular accident) (HCC)    Hyperlipidemia    Hypertension    PVD (peripheral vascular disease) (HCC)    Past Surgical History:  Procedure Laterality Date   AORTIC ARCH ANGIOGRAPHY N/A 09/09/2018   Procedure: AORTIC ARCH ANGIOGRAPHY;  Surgeon: Sherren Kerns, MD;  Location: [facility] INVASIVE CV LAB;  Service: Cardiovascular;  Laterality: N/A;   CAROTID ANGIOGRAPHY Bilateral 09/09/2018   Procedure: CAROTID ANGIOGRAPHY;  Surgeon: Sherren Kerns, MD;  Location: [facility] INVASIVE CV LAB;  Service: Cardiovascular;  Laterality: Bilateral;     Prior to Admission medications   Medication Sig Start Date End Date Taking? Authorizing Provider  allopurinol (ZYLOPRIM) 300 MG tablet Take 300 mg by mouth daily.    [provider]  aspirin EC 81 MG EC tablet Take 1 tablet (81 mg total) by mouth daily. 09/06/18   Lanelle Bal, MD  atorvastatin (  LIPITOR) 80 MG tablet Take 1 tablet (80 mg total) by mouth daily at 6 PM. 09/05/18   Lanelle BalHarbrecht, Lawrence, MD  clopidogrel (PLAVIX) 75 MG tablet Take 1 tablet (75 mg total) by mouth daily. 09/06/18   Lanelle BalHarbrecht, Lawrence, MD  diclofenac (VOLTAREN) 75 MG EC tablet Take 75 mg by mouth at bedtime as needed for mild pain or moderate pain.    [provider]  hydrochlorothiazide (HYDRODIURIL) 50 MG tablet Take 50 mg by mouth daily.    [provider]  lisinopril (PRINIVIL,ZESTRIL) 40 MG tablet Take 40 mg by mouth daily.    [provider]    Inpatient Medications: Scheduled Meds:   stroke: mapping our early stages of recovery book   Does not apply Once   aspirin  300 mg Rectal Daily   Or   aspirin  325 mg Oral Daily   heparin  5,000 Units Subcutaneous Q8H   sodium zirconium cyclosilicate  10 g Oral Q6H   Continuous Infusions:   sodium bicarbonate (isotonic) infusion in sterile water 125 mL/hr at 04/05/19 1312   PRN Meds: acetaminophen **OR** acetaminophen (TYLENOL) oral liquid 160 mg/5 mL **OR** acetaminophen  Allergies:   No Known Allergies  Social History:   Social History   Socioeconomic History   Marital status: Divorced    Spouse name: Not on file   Number of children: Not on file   Years of education: Not on file   Highest education level: Not on file  Occupational History   Not on file  Social Needs   Financial resource strain: Not on file   Food insecurity:    Worry: Not on file    Inability: Not on file   Transportation needs:    Medical: Not on file    Non-medical: Not on file  Tobacco Use   Smoking status: Not on file  Substance and Sexual Activity   Alcohol use: Not on file   Drug use: Not on file   Sexual activity: Not on file  Lifestyle   Physical activity:    Days per week: Not on file    Minutes per session: Not on file   Stress: Not on file  Relationships   Social connections:    Talks on phone: Not on file    Gets together: Not on file    Attends religious service: Not on file    Active member of club or organization: Not on file    Attends meetings of clubs or organizations: Not on file    Relationship status: Not on file   Intimate partner violence:    Fear of current or ex partner: Not on file    Emotionally abused: Not on file    Physically abused: Not on file    Forced sexual activity: Not on file  Other Topics Concern   Not on  file  Social History Narrative   Not on file    Family History:   No family history on file. Family Status:  No family status information on file.   ROS:  Please see the history of present illness.  All other ROS reviewed and negative.     Physical Exam/Data:   Vitals:   04/05/19 1145 04/05/19 1240 04/05/19 1312 04/05/19 1557  BP:  105/61 (!) 139/105 127/61  Pulse:   (!) 125 (!) 111  Resp: (!) 21  (!) 22   Temp:   97.6 F (36.4 C) 98 F (36.7 C)  TempSrc:   Axillary  Oral  SpO2:  98% 95% 100%  Weight:       No intake or output data in the 24 hours ending 04/05/19 1621 Filed Weights   04/05/19 0947  Weight: 88.8 kg   Body mass index is 33.6 kg/m.   General: Obese male in no distress HEENT: normal Lymph: no adenopathy Neck: no JVD Endocrine:  No thryomegaly Vascular: No carotid bruits; 4/4 extremity pulses 2+, without bruits  Cardiac: Irregularly irregular; no murmur  Lungs: Diminished breath sounds bilaterally Abd: soft, nontender, no hepatomegaly, obese Ext: no edema Musculoskeletal:  No deformitiesSkin: warm and dry  Neuro: Patient is aphasic, right-sided weakness noted Psych: Difficult to assess  EKG:  The EKG was personally reviewed and demonstrates:  04/05/2019 NSR with PACs, lateral ST depression and BBB changed from prior EKG   Telemetry:  Telemetry was personally reviewed and demonstrates: Multifocal atrial tachycardia heart rate 100-130s  Relevant CV Studies:  ECHO: 09/04/2018:  - Left ventricle: The cavity size was normal. Wall thickness was   increased in a pattern of mild LVH. The estimated ejection   fraction was 55%. Although no diagnostic regional wall motion   abnormality was identified, this possibility cannot be completely   excluded on the basis of this study. Doppler parameters are   consistent with abnormal left ventricular relaxation (grade 1   diastolic dysfunction). - Aortic valve: Mildly calcified annulus. Functionally  bicuspid;   moderately calcified leaflets. There was probable moderate to   severe stenosis with relatively low gradients. Mean gradient (S):   11 mm Hg. Peak gradient (S): 20 mm Hg. VTI ratio of LVOT to   aortic valve: 0.41. Valve area (VTI): 1.05 cm^2. Valve area 1.12   cm2 by planimetry. - Mitral valve: Mildly calcified annulus. There was mild   regurgitation. - Right ventricle: The cavity size was mildly dilated. Systolic   function was mildly reduced. - Atrial septum: No defect or patent foramen ovale was identified. - Tricuspid valve: There was mild regurgitation. - Pulmonary arteries: PA peak pressure: 22 mm Hg (S). - Pericardium, extracardiac: There was no pericardial effusion.  CATH: None   Laboratory Data:  Chemistry Recent Labs  Lab 04/05/19 0946 04/05/19 0953 04/05/19 1313  NA 138  --  138  K 6.8*  --  5.9*  CL 112*  --  113*  CO2 10*  --  10*  GLUCOSE 115*  --  131*  BUN 158*  --  158*  CREATININE 10.48* 10.90* 10.14*  CALCIUM 9.1  --  9.0  GFRNONAA 4*  --  5*  GFRAA 5*  --  5*  ANIONGAP 16*  --  15    Total Protein  Date Value Ref Range Status  04/05/2019 7.8 6.5 - 8.1 g/dL Final   Albumin  Date Value Ref Range Status  04/05/2019 3.9 3.5 - 5.0 g/dL Final   AST  Date Value Ref Range Status  04/05/2019 43 (H) 15 - 41 U/L Final   ALT  Date Value Ref Range Status  04/05/2019 24 0 - 44 U/L Final   Alkaline Phosphatase  Date Value Ref Range Status  04/05/2019 133 (H) 38 - 126 U/L Final   Total Bilirubin  Date Value Ref Range Status  04/05/2019 0.6 0.3 - 1.2 mg/dL Final   Hematology Recent Labs  Lab 04/05/19 0946  WBC 8.8  RBC 3.71*  HGB 10.5*  HCT 36.3*  MCV 97.8  MCH 28.3  MCHC 28.9*  RDW 13.4  PLT 179  Cardiac Enzymes Recent Labs  Lab 04/05/19 1055  TROPONINI 3.29*    Recent Labs  Lab 04/05/19 1118  TROPIPOC 3.99*    BNPNo results for input(s): BNP, PROBNP in the last 168 hours.  DDimer No results for input(s): DDIMER  in the last 168 hours. TSH: No results found for: TSH Lipids: Lab Results  Component Value Date   CHOL 147 09/04/2018   HDL 29 (L) 09/04/2018   LDLCALC 95 09/04/2018   TRIG 114 09/04/2018   CHOLHDL 5.1 09/04/2018   HgbA1c: Lab Results  Component Value Date   HGBA1C 6.2 (H) 09/04/2018    Radiology/Studies:  [name] Head Wo Contrast  Result Date: 04/05/2019 CLINICAL DATA:  Acute presentation with right-sided weakness and speech disturbance. EXAM: MRI HEAD WITHOUT CONTRAST MRA HEAD WITHOUT CONTRAST MRA NECK WITHOUT CONTRAST TECHNIQUE: Multiplanar, multiecho pulse sequences of the brain and surrounding structures were obtained without intravenous contrast. Angiographic images of the Circle of Willis were obtained using MRA technique without intravenous contrast. Angiographic images of the neck were obtained using MRA technique without intravenous contrast. Carotid stenosis measurements (when applicable) are obtained utilizing NASCET criteria, using the distal internal carotid diameter as the denominator. COMPARISON:  Head CT same day.  MRI 09/03/2018. FINDINGS: MRI HEAD FINDINGS Brain: Diffusion imaging confirms acute infarction in the left insula and posterior frontal region. The area of involvement measures about 3.5 cm in size. No mass effect or visible hemorrhage. No hydrocephalus. Chronic small-vessel ischemic changes of the deep white matter seen elsewhere. Vascular: Major vessels at the base of the brain show flow. Skull and upper cervical spine: Negative Sinuses/Orbits: Clear/normal Other: None MRA HEAD FINDINGS Both internal carotid arteries are patent through the skull base and siphon regions with atherosclerotic narrowing and irregularity in the siphon regions. Supraclinoid internal carotid arteries are widely patent. The anterior and middle cerebral vessels appear to show flow without visible large or medium vessel occlusion. Distal branch vessels do show some atherosclerotic irregularity.  The left vertebral artery supplies the basilar. No antegrade flow seen in the right vertebral artery. The basilar artery shows atherosclerotic narrowing and irregularity diffusely. Posterior circulation branch vessels are patent, with both posterior cerebral arteries receiving most of there supply from the anterior circulation. There is long segment stenosis of the left P1 segment, but good flow seen distal to that. MRA NECK FINDINGS There is antegrade flow in both common carotid arteries. The study suffers from pronounced motion degradation. Antegrade flow is seen in both cervical internal carotid arteries, but accurate evaluation of the carotid bifurcations is not possible. There is antegrade flow in the left vertebral artery but none seen on the right. IMPRESSION: 3.5 cm region of acute infarction affecting the left insula and posterior frontal region. No evidence of gross hemorrhage. MR angiography of the neck markedly degraded by motion. Both internal carotid arteries show antegrade flow. No antegrade flow in the right vertebral artery. Intracranial MR angiography does not show any large or medium vessel occlusion. Widespread atherosclerotic irregularity of the more distal branch vessels. Electronically Signed   By: Paulina Fusi M.D.   On: 04/05/2019 10:59   [name] Neck Wo Contrast  Result Date: 04/05/2019 CLINICAL DATA:  Acute presentation with right-sided weakness and speech disturbance. EXAM: MRI HEAD WITHOUT CONTRAST MRA HEAD WITHOUT CONTRAST MRA NECK WITHOUT CONTRAST TECHNIQUE: Multiplanar, multiecho pulse sequences of the brain and surrounding structures were obtained without intravenous contrast. Angiographic images of the Circle of Willis were obtained using MRA technique without intravenous  contrast. Angiographic images of the neck were obtained using MRA technique without intravenous contrast. Carotid stenosis measurements (when applicable) are obtained utilizing NASCET criteria, using the distal  internal carotid diameter as the denominator. COMPARISON:  Head CT same day.  MRI 09/03/2018. FINDINGS: MRI HEAD FINDINGS Brain: Diffusion imaging confirms acute infarction in the left insula and posterior frontal region. The area of involvement measures about 3.5 cm in size. No mass effect or visible hemorrhage. No hydrocephalus. Chronic small-vessel ischemic changes of the deep white matter seen elsewhere. Vascular: Major vessels at the base of the brain show flow. Skull and upper cervical spine: Negative Sinuses/Orbits: Clear/normal Other: None MRA HEAD FINDINGS Both internal carotid arteries are patent through the skull base and siphon regions with atherosclerotic narrowing and irregularity in the siphon regions. Supraclinoid internal carotid arteries are widely patent. The anterior and middle cerebral vessels appear to show flow without visible large or medium vessel occlusion. Distal branch vessels do show some atherosclerotic irregularity. The left vertebral artery supplies the basilar. No antegrade flow seen in the right vertebral artery. The basilar artery shows atherosclerotic narrowing and irregularity diffusely. Posterior circulation branch vessels are patent, with both posterior cerebral arteries receiving most of there supply from the anterior circulation. There is long segment stenosis of the left P1 segment, but good flow seen distal to that. MRA NECK FINDINGS There is antegrade flow in both common carotid arteries. The study suffers from pronounced motion degradation. Antegrade flow is seen in both cervical internal carotid arteries, but accurate evaluation of the carotid bifurcations is not possible. There is antegrade flow in the left vertebral artery but none seen on the right. IMPRESSION: 3.5 cm region of acute infarction affecting the left insula and posterior frontal region. No evidence of gross hemorrhage. MR angiography of the neck markedly degraded by motion. Both internal carotid arteries  show antegrade flow. No antegrade flow in the right vertebral artery. Intracranial MR angiography does not show any large or medium vessel occlusion. Widespread atherosclerotic irregularity of the more distal branch vessels. Electronically Signed   By: Paulina Fusi M.D.   On: 04/05/2019 10:59   Mr Brain Wo Contrast  Result Date: 04/05/2019 CLINICAL DATA:  Acute presentation with right-sided weakness and speech disturbance. EXAM: MRI HEAD WITHOUT CONTRAST MRA HEAD WITHOUT CONTRAST MRA NECK WITHOUT CONTRAST TECHNIQUE: Multiplanar, multiecho pulse sequences of the brain and surrounding structures were obtained without intravenous contrast. Angiographic images of the Circle of Willis were obtained using MRA technique without intravenous contrast. Angiographic images of the neck were obtained using MRA technique without intravenous contrast. Carotid stenosis measurements (when applicable) are obtained utilizing NASCET criteria, using the distal internal carotid diameter as the denominator. COMPARISON:  Head CT same day.  MRI 09/03/2018. FINDINGS: MRI HEAD FINDINGS Brain: Diffusion imaging confirms acute infarction in the left insula and posterior frontal region. The area of involvement measures about 3.5 cm in size. No mass effect or visible hemorrhage. No hydrocephalus. Chronic small-vessel ischemic changes of the deep white matter seen elsewhere. Vascular: Major vessels at the base of the brain show flow. Skull and upper cervical spine: Negative Sinuses/Orbits: Clear/normal Other: None MRA HEAD FINDINGS Both internal carotid arteries are patent through the skull base and siphon regions with atherosclerotic narrowing and irregularity in the siphon regions. Supraclinoid internal carotid arteries are widely patent. The anterior and middle cerebral vessels appear to show flow without visible large or medium vessel occlusion. Distal branch vessels do show some atherosclerotic irregularity. The left vertebral artery  supplies the basilar. No antegrade flow seen in the right vertebral artery. The basilar artery shows atherosclerotic narrowing and irregularity diffusely. Posterior circulation branch vessels are patent, with both posterior cerebral arteries receiving most of there supply from the anterior circulation. There is long segment stenosis of the left P1 segment, but good flow seen distal to that. MRA NECK FINDINGS There is antegrade flow in both common carotid arteries. The study suffers from pronounced motion degradation. Antegrade flow is seen in both cervical internal carotid arteries, but accurate evaluation of the carotid bifurcations is not possible. There is antegrade flow in the left vertebral artery but none seen on the right. IMPRESSION: 3.5 cm region of acute infarction affecting the left insula and posterior frontal region. No evidence of gross hemorrhage. MR angiography of the neck markedly degraded by motion. Both internal carotid arteries show antegrade flow. No antegrade flow in the right vertebral artery. Intracranial MR angiography does not show any large or medium vessel occlusion. Widespread atherosclerotic irregularity of the more distal branch vessels. Electronically Signed   By: Paulina Fusi M.D.   On: 04/05/2019 10:59   US Renal  Result Date: 04/05/2019 CLINICAL DATA:  68 year old male with hypertension EXAM: RENAL / URINARY TRACT ULTRASOUND COMPLETE COMPARISON:  None. FINDINGS: Right Kidney: Length: 11.1 cm x 5.3 cm x 6.1 cm, 189 cc. Hydronephrosis. Flow confirmed in the hilum of the right kidney. Left Kidney: Length: 10.9 cm x 5.6 cm x 5.9 cm, 187 cc. Hydronephrosis. Flow confirmed in the hilum of the left kidney. Anechoic cystic structure on the inferior cortex measures 2.6 cm x 1.7 cm with through transmission and no internal color flow. Additional cystic lesion measures 1.1 cm x 1.1 cm x 1.0 cm, with through transmission and no internal flow. Bladder: Catheter in place within the bladder.  IMPRESSION: Bilateral hydronephrosis. If there is concern for distal obstruction, CT may be indicated. Left-sided renal cysts. Electronically Signed   By: Gilmer Mor D.O.   On: 04/05/2019 15:39   Dg Chest Port 1 View  Result Date: 04/05/2019 CLINICAL DATA:  Altered behavior. EXAM: PORTABLE CHEST 1 VIEW COMPARISON:  None. FINDINGS: Lungs are adequately inflated and otherwise clear. Borderline cardiomegaly. Mediastinum is within normal. Fusion hardware over the lower cervical spine intact. Minimal degenerative change of the spine. IMPRESSION: No acute findings. Electronically Signed   By: Elberta Fortis M.D.   On: 04/05/2019 11:32   Ct Head Code Stroke Wo Contrast  Result Date: 04/05/2019 CLINICAL DATA:  Code stroke. Right-sided weakness. Speech disturbance. Last seen normal 0730 hours. EXAM: CT HEAD WITHOUT CONTRAST TECHNIQUE: Contiguous axial images were obtained from the base of the skull through the vertex without intravenous contrast. COMPARISON:  09/03/2018 FINDINGS: Brain: No CT evidence of acute infarction. Chronic small-vessel ischemic changes of the cerebral hemispheric white matter. No mass lesion, hemorrhage, hydrocephalus or extra-axial collection. Vascular: No definite hyperdense vessel. One could question slight hyperdensity in the left M1 segment. Skull: Negative Sinuses/Orbits: Clear/normal Other: None ASPECTS (Alberta Stroke Program Early CT Score) - Ganglionic level infarction (caudate, lentiform nuclei, internal capsule, insula, M1-M3 cortex): 6 - Supraganglionic infarction (M4-M6 cortex): 3 Total score (0-10 with 10 being normal): 9 IMPRESSION: 1. Possible early gray-white differentiation loss in the insula on the left. Chronic small-vessel ischemic changes. 2. One could question slight hyperdensity in the left M1 segment. This is not definite. 3. ASPECTS is 9 4. These results were communicated to Dr. Amada Jupiter at 10:01 amon 4/15/2020by text page via the [facility] messaging system.  Electronically Signed   By: Paulina Fusi M.D.   On: 04/05/2019 10:04   Assessment and Plan:   1. Elevated troponin: -Pt presented with acute onset right-sided weakness and inability to communicate. History obtained from chart review and bedside RN given the above. He was apparently in his usual state of health prior to yesterday morning when he was found unresponsive on his floor, unable to talk or move by his son.  -Creatinine on arrival was found to be 10.8 and his K+ was 6.8.  -Given his acute symptoms, code stoke was called. Imaging revealed acute 3.5cm infarction in the left insula and posterior frontal region without hemorrhage>>neurology was consulted -Troponin was obtained that was markedly elevated at 3.29>3.99 with no complaints of pain or discomfort per non-verbal cues with head nodding per bedside RN -EKG with sinus tachycardia and new ST depression in lateral leads as well as BBB. -Last echocardiogram 08/2018 with LVEF at 55% with no RWA and a G1DD. There was no significant valve disease noted.   -More recent echo with pending results -He has significant risk factors including HTN, HLD, and known vascular disease. In the setting of acute CVA and AKI, would not pursue further ischemic evaluation at this point. Would optimize medical therapy with statin, beta blocker and high dose ASA per neurology for now and could consider OP workup at a later time if appropriate  2. Acute stroke with right-sided weakness and expressive aphasia: -Presentation as noted above>>>MRI with acute CVA and no hemorrhage. -Will need TEE/echo to determine more definitive etiology   -Risk stratification per Neurology with HbA1c, lipid, echocardiogram  -Placed on high dose ASA (PR until passing swallow study) -Will need PT/OT -Allow permissive hypertension  3. Acute kidney injury: -Creatinine noted to be 10.9 on hospital presentation -Baseline appears to be in the 1.5-1.7 range -Nephrology consulted with  plans for fluid challenge with bicarb as well as renal ultrasound. If decompensated, then plan for CRRT per nephrology -Holding home ACI and HCTZ   4. Hyperlipidemia: -LDL 95 on 09/04/2018 -Lipitor on hold until able to pass swallow study   5. Peripheral vascular disease: -Hold Plavix until passes swallow eval  6. Hyperkalemia: -Presenting K+ 6.8>>>down to 5.9 today secondary to AKI -Pt started on Lokelma per nephrology   7. HTN: -Stable, 139/105>105/61>131/68>130/84 -Allowing permissive HTN per neurology  -No antihypertensives at this time   8. DM2: -Last HbA1c, 6.2 on 09/04/2018 -Not on home DM medication    For questions or updates, please contact [facility] Please consult [email or web address] for contact info under Cardiology/STEMI.   Raliegh Ip NP-[facility] Pager: [phone] 04/05/2019 4:21 PM  Patient seen, examined. Available data reviewed. Agree with findings, assessment, and plan as outlined by Georgie Chard, NP-C.  The patient is independently interviewed and examined.  The physical exam findings documented above reflect my personal exam findings of this patient today.  The patient has had a significant stroke.  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  I have reviewed his echocardiogram which is now been completed and demonstrates normal LV systolic function, mild to moderate aortic stenosis, and moderate calcification of the mitral valve and mitral annulus.  There is no severe valvular disease noted.  The patient's telemetry is reviewed and demonstrates multifocal atrial tachycardia with a heart rate ranging from 100 to 140 bpm.  There is no clear evidence of atrial fibrillation or flutter.  The patient's cardiac enzymes are elevated and this is suggestive of non-STEMI.  However, the patient's creatinine is 10 mg/dL and I am not certain of the diagnostic accuracy of a troponin level with that degree of acute renal failure.  The patient denies  chest pain at present.  He is clearly not a candidate for invasive cardiac evaluation.  It seems reasonable to treat him supportively with medical therapy for presumed coronary artery disease and atherosclerotic disease.  He should be treated with a high intensity statin drug, antiplatelet therapy with aspirin and clopidogrel if okay with the neurology team, and a beta-blocker as blood pressure will allow.  I have reviewed notes of stroke neurology, triad hospitalist, and nephrology.  Anticipate that the patient will need hemodialysis.  Tonny Bollman, M.D. 04/05/2019 4:23 PM

## 2019-04-05 NOTE — Progress Notes (Signed)
OT Cancellation Note  Patient Details Name: Sumter Gozman MRN: [id number] DOB: 1951/12/06   Cancelled Treatment:    Reason Eval/Treat Not Completed: Patient not medically ready;Medical issues which prohibited therapy, pt troponin elevated at 3.29.  Will follow and initiate OT eval when medically and appropriate and able.   Chancy Milroy, OT Acute Rehabilitation Services Pager [phone] Office [phone]   Chancy Milroy 04/05/2019, 2:17 PM

## 2019-04-05 NOTE — ED Notes (Signed)
ED TO INPATIENT HANDOFF REPORT  ED Nurse Name and Phone #: Teagon Kron [phone]  S Name/Age/Gender Gregory [name] 68 y.o. male Room/Bed: 034C/034C  Code Status   Code Status: Prior  Home/SNF/Other Home Patient oriented to: self Is this baseline? No   Triage Complete: Triage complete  Chief Complaint code stroke  Triage Note Per wife pt was normal at 0730 with rt sided weakness, pt non verbal , upon further questioning of wife LSN was last night , she did not know meds no iv acess on arrival   Allergies No Known Allergies  Level of Care/Admitting Diagnosis ED Disposition    ED Disposition Condition Comment   Admit  Hospital Area: [facility] [[id number]]  Level of Care: Progressive [102]  Diagnosis: Stroke (cerebrum) [facility]) [[id number]]  Admitting Physician: Leatha Gilding [phone]  Attending Physician: Leatha Gilding [phone]  Estimated length of stay: past midnight tomorrow  Certification:: I certify this patient will need inpatient services for at least 2 midnights  Possible Covid Disease Patient Isolation: N/A  PT Class (Do Not Modify): Inpatient [101]  PT Acc Code (Do Not Modify): Private [1]       B Medical/Surgery History No past medical history on file. Past Surgical History:  Procedure Laterality Date  . AORTIC ARCH ANGIOGRAPHY N/A 09/09/2018   Procedure: AORTIC ARCH ANGIOGRAPHY;  Surgeon: Sherren Kerns, MD;  Location: [facility] INVASIVE CV LAB;  Service: Cardiovascular;  Laterality: N/A;  . CAROTID ANGIOGRAPHY Bilateral 09/09/2018   Procedure: CAROTID ANGIOGRAPHY;  Surgeon: Sherren Kerns, MD;  Location: [facility] INVASIVE CV LAB;  Service: Cardiovascular;  Laterality: Bilateral;     A IV Location/Drains/Wounds Patient Lines/Drains/Airways Status   Active Line/Drains/Airways    Name:   Placement date:   Placement time:   Site:   Days:   Peripheral IV 04/05/19 Left Hand   04/05/19    1047    Hand   less than 1          Intake/Output Last 24  hours No intake or output data in the 24 hours ending 04/05/19 1149  Labs/Imaging Results for orders placed or performed during the hospital encounter of 04/05/19 (from the past 48 hour(s))  Protime-INR     Status: None   Collection Time: 04/05/19  9:46 AM  Result Value Ref Range   Prothrombin Time 14.4 11.4 - 15.2 seconds   INR 1.1 0.8 - 1.2    Comment: (NOTE) INR goal varies based on device and disease states. Performed at [facility], [address] [place], [place] [address]   APTT     Status: None   Collection Time: 04/05/19  9:46 AM  Result Value Ref Range   aPTT 27 24 - 36 seconds    Comment: Performed at [facility], [address], [place], [place] [address]  CBC     Status: Abnormal   Collection Time: 04/05/19  9:46 AM  Result Value Ref Range   WBC 8.8 4.0 - 10.5 K/uL   RBC 3.71 (L) 4.22 - 5.81 MIL/uL   Hemoglobin 10.5 (L) 13.0 - 17.0 g/dL   HCT 00.8 (L) 67.6 - 19.5 %   MCV 97.8 80.0 - 100.0 fL   MCH 28.3 26.0 - 34.0 pg   MCHC 28.9 (L) 30.0 - 36.0 g/dL   RDW 09.3 26.7 - 12.4 %   Platelets 179 150 - 400 K/uL   nRBC 0.0 0.0 - 0.2 %    Comment: Performed  at [facility], [address] [place], [place] [address]  Differential     Status: None   Collection Time: 04/05/19  9:46 AM  Result Value Ref Range   Neutrophils Relative % 78 %   Neutro Abs 6.9 1.7 - 7.7 K/uL   Lymphocytes Relative 13 %   Lymphs Abs 1.2 0.7 - 4.0 K/uL   Monocytes Relative 7 %   Monocytes Absolute 0.7 0.1 - 1.0 K/uL   Eosinophils Relative 1 %   Eosinophils Absolute 0.1 0.0 - 0.5 K/uL   Basophils Relative 1 %   Basophils Absolute 0.0 0.0 - 0.1 K/uL   Immature Granulocytes 0 %   Abs Immature Granulocytes 0.02 0.00 - 0.07 K/uL    Comment: Performed at [facility], [address], [place], [place] [address]  Comprehensive metabolic panel     Status: Abnormal   Collection Time: 04/05/19  9:46 AM  Result Value Ref Range   Sodium 138 135 - 145 mmol/L   Potassium  6.8 (HH) 3.5 - 5.1 mmol/L    Comment: NO VISIBLE HEMOLYSIS CRITICAL RESULT CALLED TO, READ BACK BY AND VERIFIED WITH: C.ROUGHGARDEN,RN 1036 04/05/2019 CLARK, S    Chloride 112 (H) 98 - 111 mmol/L   CO2 10 (L) 22 - 32 mmol/L   Glucose, Bld 115 (H) 70 - 99 mg/dL   BUN 563 (H) 8 - 23 mg/dL   Creatinine, Ser 87.56 (H) 0.61 - 1.24 mg/dL   Calcium 9.1 8.9 - 43.3 mg/dL   Total Protein 7.8 6.5 - 8.1 g/dL   Albumin 3.9 3.5 - 5.0 g/dL   AST 43 (H) 15 - 41 U/L   ALT 24 0 - 44 U/L   Alkaline Phosphatase 133 (H) 38 - 126 U/L   Total Bilirubin 0.6 0.3 - 1.2 mg/dL   GFR calc non Af Amer 4 (L) >60 mL/min   GFR calc Af Amer 5 (L) >60 mL/min   Anion gap 16 (H) 5 - 15    Comment: Performed at [facility], [address] [place], [place] [address]  CBG monitoring, ED     Status: None   Collection Time: 04/05/19  9:47 AM  Result Value Ref Range   Glucose-Capillary 95 70 - 99 mg/dL  I-stat Creatinine, ED     Status: Abnormal   Collection Time: 04/05/19  9:53 AM  Result Value Ref Range   Creatinine, Ser 10.90 (H) 0.61 - 1.24 mg/dL  CBG monitoring, ED     Status: Abnormal   Collection Time: 04/05/19 11:20 AM  Result Value Ref Range   Glucose-Capillary 102 (H) 70 - 99 mg/dL   Mr [facility] Wo Contrast  Result Date: 04/05/2019 CLINICAL DATA:  Acute presentation with right-sided weakness and speech disturbance. EXAM: MRI HEAD WITHOUT CONTRAST MRA HEAD WITHOUT CONTRAST MRA NECK WITHOUT CONTRAST TECHNIQUE: Multiplanar, multiecho pulse sequences of the brain and surrounding structures were obtained without intravenous contrast. Angiographic images of the Circle of Willis were obtained using MRA technique without intravenous contrast. Angiographic images of the neck were obtained using MRA technique without intravenous contrast. Carotid stenosis measurements (when applicable) are obtained utilizing NASCET criteria, using the distal internal carotid diameter as the denominator. COMPARISON:  Head CT same  day.  MRI 09/03/2018. FINDINGS: MRI HEAD FINDINGS Brain: Diffusion imaging confirms acute infarction in the left insula and posterior frontal region. The area of involvement measures about 3.5 cm in size. No mass effect or visible hemorrhage. No hydrocephalus.  Chronic small-vessel ischemic changes of the deep white matter seen elsewhere. Vascular: Major vessels at the base of the brain show flow. Skull and upper cervical spine: Negative Sinuses/Orbits: Clear/normal Other: None MRA HEAD FINDINGS Both internal carotid arteries are patent through the skull base and siphon regions with atherosclerotic narrowing and irregularity in the siphon regions. Supraclinoid internal carotid arteries are widely patent. The anterior and middle cerebral vessels appear to show flow without visible large or medium vessel occlusion. Distal branch vessels do show some atherosclerotic irregularity. The left vertebral artery supplies the basilar. No antegrade flow seen in the right vertebral artery. The basilar artery shows atherosclerotic narrowing and irregularity diffusely. Posterior circulation branch vessels are patent, with both posterior cerebral arteries receiving most of there supply from the anterior circulation. There is long segment stenosis of the left P1 segment, but good flow seen distal to that. MRA NECK FINDINGS There is antegrade flow in both common carotid arteries. The study suffers from pronounced motion degradation. Antegrade flow is seen in both cervical internal carotid arteries, but accurate evaluation of the carotid bifurcations is not possible. There is antegrade flow in the left vertebral artery but none seen on the right. IMPRESSION: 3.5 cm region of acute infarction affecting the left insula and posterior frontal region. No evidence of gross hemorrhage. MR angiography of the neck markedly degraded by motion. Both internal carotid arteries show antegrade flow. No antegrade flow in the right vertebral artery.  Intracranial MR angiography does not show any large or medium vessel occlusion. Widespread atherosclerotic irregularity of the more distal branch vessels. Electronically Signed   By: Paulina Fusi M.D.   On: 04/05/2019 10:59   [name] Neck Wo Contrast  Result Date: 04/05/2019 CLINICAL DATA:  Acute presentation with right-sided weakness and speech disturbance. EXAM: MRI HEAD WITHOUT CONTRAST MRA HEAD WITHOUT CONTRAST MRA NECK WITHOUT CONTRAST TECHNIQUE: Multiplanar, multiecho pulse sequences of the brain and surrounding structures were obtained without intravenous contrast. Angiographic images of the Circle of Willis were obtained using MRA technique without intravenous contrast. Angiographic images of the neck were obtained using MRA technique without intravenous contrast. Carotid stenosis measurements (when applicable) are obtained utilizing NASCET criteria, using the distal internal carotid diameter as the denominator. COMPARISON:  Head CT same day.  MRI 09/03/2018. FINDINGS: MRI HEAD FINDINGS Brain: Diffusion imaging confirms acute infarction in the left insula and posterior frontal region. The area of involvement measures about 3.5 cm in size. No mass effect or visible hemorrhage. No hydrocephalus. Chronic small-vessel ischemic changes of the deep white matter seen elsewhere. Vascular: Major vessels at the base of the brain show flow. Skull and upper cervical spine: Negative Sinuses/Orbits: Clear/normal Other: None MRA HEAD FINDINGS Both internal carotid arteries are patent through the skull base and siphon regions with atherosclerotic narrowing and irregularity in the siphon regions. Supraclinoid internal carotid arteries are widely patent. The anterior and middle cerebral vessels appear to show flow without visible large or medium vessel occlusion. Distal branch vessels do show some atherosclerotic irregularity. The left vertebral artery supplies the basilar. No antegrade flow seen in the right vertebral  artery. The basilar artery shows atherosclerotic narrowing and irregularity diffusely. Posterior circulation branch vessels are patent, with both posterior cerebral arteries receiving most of there supply from the anterior circulation. There is long segment stenosis of the left P1 segment, but good flow seen distal to that. MRA NECK FINDINGS There is antegrade flow in both common carotid arteries. The study suffers from pronounced motion degradation. Antegrade  flow is seen in both cervical internal carotid arteries, but accurate evaluation of the carotid bifurcations is not possible. There is antegrade flow in the left vertebral artery but none seen on the right. IMPRESSION: 3.5 cm region of acute infarction affecting the left insula and posterior frontal region. No evidence of gross hemorrhage. MR angiography of the neck markedly degraded by motion. Both internal carotid arteries show antegrade flow. No antegrade flow in the right vertebral artery. Intracranial MR angiography does not show any large or medium vessel occlusion. Widespread atherosclerotic irregularity of the more distal branch vessels. Electronically Signed   By: Paulina Fusi M.D.   On: 04/05/2019 10:59   Mr Brain Wo Contrast  Result Date: 04/05/2019 CLINICAL DATA:  Acute presentation with right-sided weakness and speech disturbance. EXAM: MRI HEAD WITHOUT CONTRAST MRA HEAD WITHOUT CONTRAST MRA NECK WITHOUT CONTRAST TECHNIQUE: Multiplanar, multiecho pulse sequences of the brain and surrounding structures were obtained without intravenous contrast. Angiographic images of the Circle of Willis were obtained using MRA technique without intravenous contrast. Angiographic images of the neck were obtained using MRA technique without intravenous contrast. Carotid stenosis measurements (when applicable) are obtained utilizing NASCET criteria, using the distal internal carotid diameter as the denominator. COMPARISON:  Head CT same day.  MRI 09/03/2018.  FINDINGS: MRI HEAD FINDINGS Brain: Diffusion imaging confirms acute infarction in the left insula and posterior frontal region. The area of involvement measures about 3.5 cm in size. No mass effect or visible hemorrhage. No hydrocephalus. Chronic small-vessel ischemic changes of the deep white matter seen elsewhere. Vascular: Major vessels at the base of the brain show flow. Skull and upper cervical spine: Negative Sinuses/Orbits: Clear/normal Other: None MRA HEAD FINDINGS Both internal carotid arteries are patent through the skull base and siphon regions with atherosclerotic narrowing and irregularity in the siphon regions. Supraclinoid internal carotid arteries are widely patent. The anterior and middle cerebral vessels appear to show flow without visible large or medium vessel occlusion. Distal branch vessels do show some atherosclerotic irregularity. The left vertebral artery supplies the basilar. No antegrade flow seen in the right vertebral artery. The basilar artery shows atherosclerotic narrowing and irregularity diffusely. Posterior circulation branch vessels are patent, with both posterior cerebral arteries receiving most of there supply from the anterior circulation. There is long segment stenosis of the left P1 segment, but good flow seen distal to that. MRA NECK FINDINGS There is antegrade flow in both common carotid arteries. The study suffers from pronounced motion degradation. Antegrade flow is seen in both cervical internal carotid arteries, but accurate evaluation of the carotid bifurcations is not possible. There is antegrade flow in the left vertebral artery but none seen on the right. IMPRESSION: 3.5 cm region of acute infarction affecting the left insula and posterior frontal region. No evidence of gross hemorrhage. MR angiography of the neck markedly degraded by motion. Both internal carotid arteries show antegrade flow. No antegrade flow in the right vertebral artery. Intracranial MR  angiography does not show any large or medium vessel occlusion. Widespread atherosclerotic irregularity of the more distal branch vessels. Electronically Signed   By: Paulina Fusi M.D.   On: 04/05/2019 10:59   Dg Chest Port 1 View  Result Date: 04/05/2019 CLINICAL DATA:  Altered behavior. EXAM: PORTABLE CHEST 1 VIEW COMPARISON:  None. FINDINGS: Lungs are adequately inflated and otherwise clear. Borderline cardiomegaly. Mediastinum is within normal. Fusion hardware over the lower cervical spine intact. Minimal degenerative change of the spine. IMPRESSION: No acute findings. Electronically Signed  By: Elberta Fortisaniel  Boyle M.D.   On: 04/05/2019 11:32   Ct Head Code Stroke Wo Contrast  Result Date: 04/05/2019 CLINICAL DATA:  Code stroke. Right-sided weakness. Speech disturbance. Last seen normal 0730 hours. EXAM: CT HEAD WITHOUT CONTRAST TECHNIQUE: Contiguous axial images were obtained from the base of the skull through the vertex without intravenous contrast. COMPARISON:  09/03/2018 FINDINGS: Brain: No CT evidence of acute infarction. Chronic small-vessel ischemic changes of the cerebral hemispheric white matter. No mass lesion, hemorrhage, hydrocephalus or extra-axial collection. Vascular: No definite hyperdense vessel. One could question slight hyperdensity in the left M1 segment. Skull: Negative Sinuses/Orbits: Clear/normal Other: None ASPECTS (Alberta Stroke Program Early CT Score) - Ganglionic level infarction (caudate, lentiform nuclei, internal capsule, insula, M1-M3 cortex): 6 - Supraganglionic infarction (M4-M6 cortex): 3 Total score (0-10 with 10 being normal): 9 IMPRESSION: 1. Possible early gray-white differentiation loss in the insula on the left. Chronic small-vessel ischemic changes. 2. One could question slight hyperdensity in the left M1 segment. This is not definite. 3. ASPECTS is 9 4. These results were communicated to Dr. Amada JupiterKirkpatrick at 10:01 amon 4/15/2020by text page via the [facility] messaging  system. Electronically Signed   By: Paulina FusiMark  Shogry M.D.   On: 04/05/2019 10:04    Pending Labs Unresulted Labs (From admission, onward)    Start     Ordered   04/05/19 1059  Ammonia  ONCE - STAT,   STAT     04/05/19 1058   04/05/19 1055  Troponin I - ONCE - STAT  ONCE - STAT,   R     04/05/19 1054   Signed and Held  Hemoglobin A1c  Tomorrow morning,   R     Signed and Held   Signed and Held  Lipid panel  Tomorrow morning,   R    Comments:  Fasting    Signed and Held          Vitals/Pain Today's Vitals   04/05/19 1015 04/05/19 1031 04/05/19 1048 04/05/19 1127  BP: (!) 149/65  130/84   Pulse: (!) 110  (!) 104 100  Resp:   14 15  Temp:  97.7 F (36.5 C)    TempSrc:  Esophageal    SpO2: 100%  100% 100%  Weight:      PainSc:   5      Isolation Precautions No active isolations  Medications Medications  calcium gluconate 1 g/ 50 mL sodium chloride IVPB (has no administration in time range)  sodium chloride flush (NS) 0.9 % injection 3 mL (3 mLs Intravenous Given 04/05/19 1121)  albuterol (PROVENTIL) (2.5 MG/3ML) 0.083% nebulizer solution 10 mg (10 mg Nebulization Given 04/05/19 1127)  sodium bicarbonate injection 50 mEq (50 mEq Intravenous Given 04/05/19 1121)  insulin aspart (novoLOG) injection 5 Units (5 Units Intravenous Given 04/05/19 1121)  dextrose 50 % solution 50 mL (50 mLs Intravenous Given 04/05/19 1121)    Mobility walks Moderate fall risk   Focused Assessments Neuro Assessment Handoff:  Swallow screen pass? No    NIH Stroke Scale ( + Modified Stroke Scale Criteria)  Interval: Initial Level of Consciousness (1a.)   : Not alert, but arousable by minor stimulation to obey, answer, or respond LOC Questions (1b. )   +: Answers neither question correctly LOC Commands (1c. )   + : Performs neither task correctly Best Gaze (2. )  +: Partial gaze palsy Visual (3. )  +: Complete hemianopia Facial Palsy (4. )    : Minor paralysis Motor  Arm, Left (5a. )   +: No  drift Motor Arm, Right (5b. )   +: No movement Motor Leg, Left (6a. )   +: No effort against gravity Motor Leg, Right (6b. )   +: No effort against gravity Limb Ataxia (7. ): Absent Sensory (8. )   +: Normal, no sensory loss Best Language (9. )   +: Mute, global aphasia Dysarthria (10. ): Severe dysarthria, patient's speech is so slurred as to be unintelligible in the absence of or out of proportion to any dysphasia, or is mute/anarthric Extinction/Inattention (11.)   +: Visual/tactile/auditory/spatial/personal inattention Modified SS Total  +: 21 Complete NIHSS TOTAL: 25 Last date known well: 04/04/19 Last time known well: (unknown) Neuro Assessment:   Neuro Checks:   Initial (04/05/19 0950)  Last Documented NIHSS Modified Score: 21 (04/05/19 1128) Has TPA been given? No If patient is a Neuro Trauma and patient is going to OR before floor call report to 4N Charge nurse: [phone] or [phone]     R Recommendations: See Admitting Provider Note  Report given to:   Additional Notes:

## 2019-04-05 NOTE — Progress Notes (Addendum)
Patient arrived to the unit from ED alert and oriented X 2 Disoriented to time and situation. He is mute but nods and gestures approprietly.  He is very sleep but easy to arouse. Denies pain Puplis 3 equal round, brisk Patient has a upper body tremor. I asked him was he cold and he said no. Skin clean, dry, and intact Placed on tele Box 11 All questions and concerns addressed Bed in the lowest position with bed alarm on. Call light in reach Nurse will continue to monitor

## 2019-04-05 NOTE — Progress Notes (Signed)
Lab report a critical troponin of 3.29 MD Lou Miner,  MD called nurse back. He will recheck later Nurse will continue to monitor

## 2019-04-05 NOTE — Progress Notes (Signed)
SLP Cancellation Note  Patient Details Name: Gregory [name] MRN: [id number] DOB: April 15, 1951   Cancelled treatment:       Reason Eval/Treat Not Completed: Fatigue/lethargy limiting ability to participate. Pt was approached for speech/language/cognition evaluation. However, he was unable to demonstrate or maintain an adequate level of alertness to participate. SLP will continue to follow pt.   Elonda Giuliano I. Vear Clock, MS, CCC-SLP Acute Rehabilitation Services Office number [phone] Pager [phone]  Scheryl Marten 04/05/2019, 2:01 PM

## 2019-04-05 NOTE — Consult Note (Addendum)
Neurology Consultation Reason for Consult: Acute stroke Referring Physician: Randol KernBero, M  CC: Stroke  History is obtained from: Patient's ex-wife, daughter  HPI: Gregory HazyDonald Lee [name] is a 68 y.o. male who was last seen well yesterday.  He apparently started noticing some numbness on his right side then, but was still talking normally and still interacting.  He then was found unresponsive on the floor this morning.  They called 911.  He was noticed to not be moving his right side as well in a root and therefore a code stroke was activated.  Family were poor historians.  On arrival to the emergency department, he was taken for CT which ruled out intracranial hemorrhage.  We are having difficulty obtaining a large enough IV access to perform a CT perfusion/angiogram and the patient's creatinine came back at 10 and therefore he was taken for emergent MRI/MRA.  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  He has a left insular ischemic stroke.  LKW: 4/14, afternoon tpa given?: no, outside of window    ROS:  Unable to obtain due to altered mental status.   Past medical history: TIA, Hypertension Gout Sleep apnea   Social History: Unable to obtain due to altered mental status.  Exam: Current vital signs: BP 130/84 (BP Location: Left Arm)   Pulse 100   Temp 97.7 F (36.5 C) (Esophageal)   Resp 15   Wt 88.8 kg   SpO2 100%   BMI 33.60 kg/m  Vital signs in last 24 hours: Temp:  [97.7 F (36.5 C)] 97.7 F (36.5 C) (04/15 1031) Pulse Rate:  [100-110] 100 (04/15 1127) Resp:  [14-15] 15 (04/15 1127) BP: (130-149)/(65-84) 130/84 (04/15 1048) SpO2:  [100 %] 100 % (04/15 1127) Weight:  [88.8 kg] 88.8 kg (04/15 0947)   Physical Exam  Constitutional: Appears well-developed and well-nourished.  Psych: Does not respond to questions Eyes: No scleral injection HENT: No OP obstrucion Head: Normocephalic.   Cardiovascular: Normal rate and regular rhythm.  Respiratory: Effort normal, non-labored breathing GI: Soft.  No distension. There is no tenderness.  Skin: WDI  Neuro: Mental Status: Patient is awake, alert, but completely aphasic, does not follow commands. Cranial Nerves: II: Does not blink to threat from the right. Pupils are equal, round, and reactive to light.   III,IV, VI: He has a left gaze preference, but does cross midline to the right V: Facial sensation is symmetric to temperature VII: Facial movement is mildly weak on the right VIII: hearing is intact to voice X: Uvula elevates symmetrically XI: Shoulder shrug is symmetric. XII: tongue is midline without atrophy or fasciculations.  Motor: He does have some weakness on the right, difficult to establish exactly how much because he does not perform confrontational testing, but he does lift right leg with least 4/5 strength and flexes left arm with at least 2-3/5 strength, moves his left side well Sensory: Response to noxious stimulation in all 4 extremities  Cerebellar: Does not perform   I have reviewed labs in epic and the results pertinent to this consultation are: BUN 158 creatinine 10.48, potassium 6.8  I have reviewed the images obtained: MRI-small to moderate left infarct, multifocal right extracranial and intracranial stenosis.  Impression: 68 year old male with ischemic infarct.  I am uncertain how much of his mental status is currently due to uremia versus his ischemic infarct.  With no evidence of a large vessel occlusion, he is not a candidate  for intra-arterial therapy.  I would favor further assessment of his carotids with ultrasound given that the MRA is suboptimal and he cannot have contrast due to renal failure.  I think that artery to artery embolization is a likely possibility..  Recommendations: - HgbA1c, fasting lipid panel - Frequent neuro checks - Echocardiogram - Carotid dopplers - Prophylactic  therapy-Antiplatelet med: Aspirin - dose 325mg  PO or 300mg  PR - Risk factor modification - Telemetry monitoring - PT consult, OT consult, Speech consult - Stroke team to follow   Ritta Slot, MD [facility] [phone]  If 7pm- 7am, please page neurology on call as listed in AMION.

## 2019-04-05 NOTE — ED Provider Notes (Signed)
[facility] Emergency Department Provider Note MRN:  [id number]  Arrival date & time: 04/05/19     Chief Complaint   Code Stroke  History of Present Illness   Gregory [name] is a 68 y.o. year-old male with a history of CKD, CAD presenting to the ED with chief complaint of code stroke.  Last seen normal 7:30 AM, at that time was conversant per wife.  Sudden onset aphasia and right-sided deficits.  Code stroke initiated in route by EMS.  Review of Systems  A complete 10 system review of systems was obtained and all systems are negative except as noted in the HPI and PMH.   Patient's Health History   No past medical history on file.  Past Surgical History:  Procedure Laterality Date  . AORTIC ARCH ANGIOGRAPHY N/A 09/09/2018   Procedure: AORTIC ARCH ANGIOGRAPHY;  Surgeon: Sherren Kerns, MD;  Location: [facility] INVASIVE CV LAB;  Service: Cardiovascular;  Laterality: N/A;  . CAROTID ANGIOGRAPHY Bilateral 09/09/2018   Procedure: CAROTID ANGIOGRAPHY;  Surgeon: Sherren Kerns, MD;  Location: [facility] INVASIVE CV LAB;  Service: Cardiovascular;  Laterality: Bilateral;    No family history on file.  Social History   Socioeconomic History  . Marital status: Divorced    Spouse name: Not on file  . Number of children: Not on file  . Years of education: Not on file  . Highest education level: Not on file  Occupational History  . Not on file  Social Needs  . Financial resource strain: Not on file  . Food insecurity:    Worry: Not on file    Inability: Not on file  . Transportation needs:    Medical: Not on file    Non-medical: Not on file  Tobacco Use  . Smoking status: Not on file  Substance and Sexual Activity  . Alcohol use: Not on file  . Drug use: Not on file  . Sexual activity: Not on file  Lifestyle  . Physical activity:    Days per week: Not on file    Minutes per session: Not on file  . Stress: Not on file  Relationships  . Social connections:    Talks  on phone: Not on file    Gets together: Not on file    Attends religious service: Not on file    Active member of club or organization: Not on file    Attends meetings of clubs or organizations: Not on file    Relationship status: Not on file  . Intimate partner violence:    Fear of current or ex partner: Not on file    Emotionally abused: Not on file    Physically abused: Not on file    Forced sexual activity: Not on file  Other Topics Concern  . Not on file  Social History Narrative  . Not on file     Physical Exam  Vital Signs and Nursing Notes reviewed Vitals:   04/05/19 1127 04/05/19 1145  BP:    Pulse: 100   Resp: 15 (!) 21  Temp:    SpO2: 100%     CONSTITUTIONAL: Ill-appearing, NAD NEURO: Somnolent, wakes to voice, nonverbal, not following commands, aphasic EYES:  eyes equal and reactive ENT/NECK:  no LAD, no JVD CARDIO: Regular rate, well-perfused, normal S1 and S2 PULM:  CTAB no wheezing or rhonchi GI/GU:  normal bowel sounds, non-distended, non-tender MSK/SPINE:  No gross deformities, no edema SKIN:  no rash, atraumatic PSYCH:  Appropriate speech  and behavior  Diagnostic and Interventional Summary    EKG Interpretation  Date/Time:  Wednesday April 05 2019 10:50:03 EDT Ventricular Rate:  105 PR Interval:    QRS Duration: 124 QT Interval:  329 QTC Calculation: 435 R Axis:   58 Text Interpretation:  Sinus tachycardia Nonspecific intraventricular conduction delay Repol abnrm, severe global ischemia (LM/MVD) Confirmed by Kennis CarinaBero, Gwendy Boeder [phone]) on 04/05/2019 10:55:06 AM      Labs Reviewed  CBC - Abnormal; Notable for the following components:      Result Value   RBC 3.71 (*)    Hemoglobin 10.5 (*)    HCT 36.3 (*)    MCHC 28.9 (*)    All other components within normal limits  COMPREHENSIVE METABOLIC PANEL - Abnormal; Notable for the following components:   Potassium 6.8 (*)    Chloride 112 (*)    CO2 10 (*)    Glucose, Bld 115 (*)    BUN 158 (*)     Creatinine, Ser 10.48 (*)    AST 43 (*)    Alkaline Phosphatase 133 (*)    GFR calc non Af Amer 4 (*)    GFR calc Af Amer 5 (*)    Anion gap 16 (*)    All other components within normal limits  I-STAT CREATININE, ED - Abnormal; Notable for the following components:   Creatinine, Ser 10.90 (*)    All other components within normal limits  CBG MONITORING, ED - Abnormal; Notable for the following components:   Glucose-Capillary 102 (*)    All other components within normal limits  PROTIME-INR  APTT  DIFFERENTIAL  TROPONIN I  AMMONIA  BASIC METABOLIC PANEL  URINALYSIS, ROUTINE W REFLEX MICROSCOPIC  SODIUM, URINE, RANDOM  UREA NITROGEN, URINE  CREATININE, URINE, RANDOM  CK  CBG MONITORING, ED    DG Chest Port 1 View  Final Result    MR BRAIN WO CONTRAST  Final Result    MR MRA HEAD WO CONTRAST  Final Result    MR MRA NECK WO CONTRAST  Final Result    CT HEAD CODE STROKE WO CONTRAST  Final Result    US RENAL    (Results Pending)    Medications  calcium gluconate 1 g/ 50 mL sodium chloride IVPB (1,000 mg Intravenous New Bag/Given 04/05/19 1228)  sodium bicarbonate 150 mEq in sterile water 1,000 mL infusion (has no administration in time range)  sodium zirconium cyclosilicate (LOKELMA) packet 10 g (has no administration in time range)  sodium chloride flush (NS) 0.9 % injection 3 mL (3 mLs Intravenous Given 04/05/19 1121)  albuterol (PROVENTIL) (2.5 MG/3ML) 0.083% nebulizer solution 10 mg (10 mg Nebulization Given 04/05/19 1127)  sodium bicarbonate injection 50 mEq (50 mEq Intravenous Given 04/05/19 1121)  insulin aspart (novoLOG) injection 5 Units (5 Units Intravenous Given 04/05/19 1121)  dextrose 50 % solution 50 mL (50 mLs Intravenous Given 04/05/19 1121)     Procedures Critical Care Critical Care Documentation Critical care time provided by me (excluding procedures): 37 minutes  Condition necessitating critical care: Concern for acute ischemic stroke, acute renal  failure  Components of critical care management: reviewing of prior records, laboratory and imaging interpretation, frequent re-examination and reassessment of vital signs, code stroke initiation protocol, discussion with consulting services, administration of IV calcium, insulin, dextrose, bicarbonate.    ED Course and Medical Decision Making  I have reviewed the triage vital signs and the nursing notes.  Pertinent labs & imaging results that were available during my  care of the patient were reviewed by me and considered in my medical decision making (see below for details).  Code stroke alert, concern for large vessel occlusion.  Protecting airway, normal oxygen saturations with EMS.  Cleared for CT.  I-STAT creatinine result was called into me by the lab, 10.9.  Patient has a history of CKD but I do not see a history of ESRD or dialysis with chart review.  Attempted to inform neurology team.  Given his need for IV contrast imaging.  However, given his profound neurological deficits, one would argue that the need for imaging outweighs the potential risk to his kidney function.  Clinical Course as of Apr 05 1227  Wed Apr 05, 2019  1106 Neurology was aware of the acute renal failure and elected for MRI imaging of the vascular system, revealing narrowing but no large vessel occlusion.  Concern is now shifting more toward a non-stroke related because of his altered mental status.  Patient has a potassium of 6.8, will stat page nephrology and provide calcium, bicarb, insulin, D50, albuterol.  Patient continues to wake to voice, nod yes or no to questions, currently denies any pain.   [MB]  1158 Spoke with hospitalist on-call, who was happy to admit the patient after emergent dialysis.  Also just spoke with Dr. Allena Katz of nephrology, who will come evaluate the patient and facilitate placement of a dialysis catheter.   [MB]    Clinical Course User Index [MB] Pilar Plate Elmer Sow, MD     Elmer Sow. Pilar Plate,  MD [facility] [email or web address]  Final Clinical Impressions(s) / ED Diagnoses     ICD-10-CM   1. Hyperkalemia E87.5   2. Altered behavior R46.89 DG Chest [facility] 1 View    DG Chest Port 1 View  3. AKI (acute kidney injury) (HCC) N17.9 US RENAL    US RENAL  4. Acute renal failure, unspecified acute renal failure type [facility]) N17.9     ED Discharge Orders    None         Sabas Sous, MD 04/05/19 1229

## 2019-04-05 NOTE — Progress Notes (Signed)
Patient has arrived back from renal US

## 2019-04-05 NOTE — ED Notes (Signed)
Unable to get iv acess x 3 to [facility]

## 2019-04-05 NOTE — Consult Note (Signed)
Reason for Consult: Acute kidney injury on chronic kidney disease stage III, hyperkalemia Referring Physician: Kennis Carina, MD (EDP)  HPI:  68 year old African-American man with past medical history (per chart) of cerebrovascular disease, gout, hypertension and what appears to be baseline chronic kidney disease stage III (creatinine ~1.6).  Brought into the emergency room today after found on the floor unresponsive 1 day after complaining of right-sided numbness.  Emergent evaluation by neurology with MRI/MRA demonstrates no acute large vessel occlusion but has evidence of left insular ischemic CVA.  He has stenosis of left ICA.  Concern is raised with his creatinine that is up to 10.5, BUN 158 and potassium 6.8 with bicarbonate of 10.  He denies any nausea, vomiting or diarrhea or any concerning genitourinary symptoms including hematuria.  His outpatient medications include lisinopril 40 mg daily, hydrochlorothiazide 50 mg daily and diclofenac 75 mg as needed.  He denies the use of nonsteroidal anti-inflammatory drugs.  No acute EKG changes noted.  No past medical history on file.  Past Surgical History:  Procedure Laterality Date  . AORTIC ARCH ANGIOGRAPHY N/A 09/09/2018   Procedure: AORTIC ARCH ANGIOGRAPHY;  Surgeon: Sherren Kerns, MD;  Location: [facility];  Service: Cardiovascular;  Laterality: N/A;  . CAROTID ANGIOGRAPHY Bilateral 09/09/2018   Procedure: CAROTID ANGIOGRAPHY;  Surgeon: Sherren Kerns, MD;  Location: [facility];  Service: Cardiovascular;  Laterality: Bilateral;    No family history on file.  Social History:  has no history on file for tobacco, alcohol, and drug.  Allergies: No Known Allergies  Medications:  Scheduled: . sodium zirconium cyclosilicate  10 g Oral Q6H    BMP Latest Ref Rng & Units 04/05/2019 04/05/2019 09/09/2018  Glucose 70 - 99 mg/dL - 161(W) 960(A)  BUN 8 - 23 mg/dL - 540(J) 81(X)  Creatinine 0.61 - 1.24 mg/dL 91.47(W) 29.56(O)  1.30(Q)  Sodium 135 - 145 mmol/L - 138 140  Potassium 3.5 - 5.1 mmol/L - 6.8(HH) 5.3(H)  Chloride 98 - 111 mmol/L - 112(H) 104  CO2 22 - 32 mmol/L - 10(L) -  Calcium 8.9 - 10.3 mg/dL - 9.1 -   CBC Latest Ref Rng & Units 04/05/2019 09/09/2018 09/03/2018  WBC 4.0 - 10.5 K/uL 8.8 - -  Hemoglobin 13.0 - 17.0 g/dL 10.5(L) 16.0 16.0  Hematocrit 39.0 - 52.0 % 36.3(L) 47.0 47.0  Platelets 150 - 400 K/uL 179 - -     [name] Head Wo Contrast  Result Date: 04/05/2019 CLINICAL DATA:  Acute presentation with right-sided weakness and speech disturbance. EXAM: MRI HEAD WITHOUT CONTRAST MRA HEAD WITHOUT CONTRAST MRA NECK WITHOUT CONTRAST TECHNIQUE: Multiplanar, multiecho pulse sequences of the brain and surrounding structures were obtained without intravenous contrast. Angiographic images of the Circle of Willis were obtained using MRA technique without intravenous contrast. Angiographic images of the neck were obtained using MRA technique without intravenous contrast. Carotid stenosis measurements (when applicable) are obtained utilizing NASCET criteria, using the distal internal carotid diameter as the denominator. COMPARISON:  Head CT same day.  MRI 09/03/2018. FINDINGS: MRI HEAD FINDINGS Brain: Diffusion imaging confirms acute infarction in the left insula and posterior frontal region. The area of involvement measures about 3.5 cm in size. No mass effect or visible hemorrhage. No hydrocephalus. Chronic small-vessel ischemic changes of the deep white matter seen elsewhere. Vascular: Major vessels at the base of the brain show flow. Skull and upper cervical spine: Negative Sinuses/Orbits: Clear/normal Other: None MRA HEAD FINDINGS Both internal carotid arteries are patent  through the skull base and siphon regions with atherosclerotic narrowing and irregularity in the siphon regions. Supraclinoid internal carotid arteries are widely patent. The anterior and middle cerebral vessels appear to show flow without visible  large or medium vessel occlusion. Distal branch vessels do show some atherosclerotic irregularity. The left vertebral artery supplies the basilar. No antegrade flow seen in the right vertebral artery. The basilar artery shows atherosclerotic narrowing and irregularity diffusely. Posterior circulation branch vessels are patent, with both posterior cerebral arteries receiving most of there supply from the anterior circulation. There is long segment stenosis of the left P1 segment, but good flow seen distal to that. MRA NECK FINDINGS There is antegrade flow in both common carotid arteries. The study suffers from pronounced motion degradation. Antegrade flow is seen in both cervical internal carotid arteries, but accurate evaluation of the carotid bifurcations is not possible. There is antegrade flow in the left vertebral artery but none seen on the right. IMPRESSION: 3.5 cm region of acute infarction affecting the left insula and posterior frontal region. No evidence of gross hemorrhage. MR angiography of the neck markedly degraded by motion. Both internal carotid arteries show antegrade flow. No antegrade flow in the right vertebral artery. Intracranial MR angiography does not show any large or medium vessel occlusion. Widespread atherosclerotic irregularity of the more distal branch vessels. Electronically Signed   By: Paulina FusiMark  Shogry M.D.   On: 04/05/2019 10:59   Mr Maxine GlennMra Neck Wo Contrast  Result Date: 04/05/2019 CLINICAL DATA:  Acute presentation with right-sided weakness and speech disturbance. EXAM: MRI HEAD WITHOUT CONTRAST MRA HEAD WITHOUT CONTRAST MRA NECK WITHOUT CONTRAST TECHNIQUE: Multiplanar, multiecho pulse sequences of the brain and surrounding structures were obtained without intravenous contrast. Angiographic images of the Circle of Willis were obtained using MRA technique without intravenous contrast. Angiographic images of the neck were obtained using MRA technique without intravenous contrast.  Carotid stenosis measurements (when applicable) are obtained utilizing NASCET criteria, using the distal internal carotid diameter as the denominator. COMPARISON:  Head CT same day.  MRI 09/03/2018. FINDINGS: MRI HEAD FINDINGS Brain: Diffusion imaging confirms acute infarction in the left insula and posterior frontal region. The area of involvement measures about 3.5 cm in size. No mass effect or visible hemorrhage. No hydrocephalus. Chronic small-vessel ischemic changes of the deep white matter seen elsewhere. Vascular: Major vessels at the base of the brain show flow. Skull and upper cervical spine: Negative Sinuses/Orbits: Clear/normal Other: None MRA HEAD FINDINGS Both internal carotid arteries are patent through the skull base and siphon regions with atherosclerotic narrowing and irregularity in the siphon regions. Supraclinoid internal carotid arteries are widely patent. The anterior and middle cerebral vessels appear to show flow without visible large or medium vessel occlusion. Distal branch vessels do show some atherosclerotic irregularity. The left vertebral artery supplies the basilar. No antegrade flow seen in the right vertebral artery. The basilar artery shows atherosclerotic narrowing and irregularity diffusely. Posterior circulation branch vessels are patent, with both posterior cerebral arteries receiving most of there supply from the anterior circulation. There is long segment stenosis of the left P1 segment, but good flow seen distal to that. MRA NECK FINDINGS There is antegrade flow in both common carotid arteries. The study suffers from pronounced motion degradation. Antegrade flow is seen in both cervical internal carotid arteries, but accurate evaluation of the carotid bifurcations is not possible. There is antegrade flow in the left vertebral artery but none seen on the right. IMPRESSION: 3.5 cm region of acute infarction  affecting the left insula and posterior frontal region. No evidence of  gross hemorrhage. MR angiography of the neck markedly degraded by motion. Both internal carotid arteries show antegrade flow. No antegrade flow in the right vertebral artery. Intracranial MR angiography does not show any large or medium vessel occlusion. Widespread atherosclerotic irregularity of the more distal branch vessels. Electronically Signed   By: Paulina Fusi M.D.   On: 04/05/2019 10:59   Mr Brain Wo Contrast  Result Date: 04/05/2019 CLINICAL DATA:  Acute presentation with right-sided weakness and speech disturbance. EXAM: MRI HEAD WITHOUT CONTRAST MRA HEAD WITHOUT CONTRAST MRA NECK WITHOUT CONTRAST TECHNIQUE: Multiplanar, multiecho pulse sequences of the brain and surrounding structures were obtained without intravenous contrast. Angiographic images of the Circle of Willis were obtained using MRA technique without intravenous contrast. Angiographic images of the neck were obtained using MRA technique without intravenous contrast. Carotid stenosis measurements (when applicable) are obtained utilizing NASCET criteria, using the distal internal carotid diameter as the denominator. COMPARISON:  Head CT same day.  MRI 09/03/2018. FINDINGS: MRI HEAD FINDINGS Brain: Diffusion imaging confirms acute infarction in the left insula and posterior frontal region. The area of involvement measures about 3.5 cm in size. No mass effect or visible hemorrhage. No hydrocephalus. Chronic small-vessel ischemic changes of the deep white matter seen elsewhere. Vascular: Major vessels at the base of the brain show flow. Skull and upper cervical spine: Negative Sinuses/Orbits: Clear/normal Other: None MRA HEAD FINDINGS Both internal carotid arteries are patent through the skull base and siphon regions with atherosclerotic narrowing and irregularity in the siphon regions. Supraclinoid internal carotid arteries are widely patent. The anterior and middle cerebral vessels appear to show flow without visible large or medium vessel  occlusion. Distal branch vessels do show some atherosclerotic irregularity. The left vertebral artery supplies the basilar. No antegrade flow seen in the right vertebral artery. The basilar artery shows atherosclerotic narrowing and irregularity diffusely. Posterior circulation branch vessels are patent, with both posterior cerebral arteries receiving most of there supply from the anterior circulation. There is long segment stenosis of the left P1 segment, but good flow seen distal to that. MRA NECK FINDINGS There is antegrade flow in both common carotid arteries. The study suffers from pronounced motion degradation. Antegrade flow is seen in both cervical internal carotid arteries, but accurate evaluation of the carotid bifurcations is not possible. There is antegrade flow in the left vertebral artery but none seen on the right. IMPRESSION: 3.5 cm region of acute infarction affecting the left insula and posterior frontal region. No evidence of gross hemorrhage. MR angiography of the neck markedly degraded by motion. Both internal carotid arteries show antegrade flow. No antegrade flow in the right vertebral artery. Intracranial MR angiography does not show any large or medium vessel occlusion. Widespread atherosclerotic irregularity of the more distal branch vessels. Electronically Signed   By: Paulina Fusi M.D.   On: 04/05/2019 10:59   Dg Chest Port 1 View  Result Date: 04/05/2019 CLINICAL DATA:  Altered behavior. EXAM: PORTABLE CHEST 1 VIEW COMPARISON:  None. FINDINGS: Lungs are adequately inflated and otherwise clear. Borderline cardiomegaly. Mediastinum is within normal. Fusion hardware over the lower cervical spine intact. Minimal degenerative change of the spine. IMPRESSION: No acute findings. Electronically Signed   By: Elberta Fortis M.D.   On: 04/05/2019 11:32   Ct Head Code Stroke Wo Contrast  Result Date: 04/05/2019 CLINICAL DATA:  Code stroke. Right-sided weakness. Speech disturbance. Last seen  normal 0730 hours. EXAM: CT HEAD  WITHOUT CONTRAST TECHNIQUE: Contiguous axial images were obtained from the base of the skull through the vertex without intravenous contrast. COMPARISON:  09/03/2018 FINDINGS: Brain: No CT evidence of acute infarction. Chronic small-vessel ischemic changes of the cerebral hemispheric white matter. No mass lesion, hemorrhage, hydrocephalus or extra-axial collection. Vascular: No definite hyperdense vessel. One could question slight hyperdensity in the left M1 segment. Skull: Negative Sinuses/Orbits: Clear/normal Other: None ASPECTS (Alberta Stroke Program Early CT Score) - Ganglionic level infarction (caudate, lentiform nuclei, internal capsule, insula, M1-M3 cortex): 6 - Supraganglionic infarction (M4-M6 cortex): 3 Total score (0-10 with 10 being normal): 9 IMPRESSION: 1. Possible early gray-white differentiation loss in the insula on the left. Chronic small-vessel ischemic changes. 2. One could question slight hyperdensity in the left M1 segment. This is not definite. 3. ASPECTS is 9 4. These results were communicated to Dr. Amada Jupiter at 10:01 amon 4/15/2020by text page via the [facility] messaging system. Electronically Signed   By: Paulina Fusi M.D.   On: 04/05/2019 10:04    Review of Systems  Constitutional: Positive for malaise/fatigue. Negative for chills and fever.  Respiratory: Negative for cough, sputum production and shortness of breath.   Cardiovascular: Negative for chest pain and leg swelling.  Gastrointestinal: Negative for abdominal pain, diarrhea, nausea and vomiting.  Genitourinary: Negative.   Neurological: Positive for sensory change, speech change and focal weakness.   Blood pressure 131/68, pulse 100, temperature 97.7 F (36.5 C), temperature source Esophageal, resp. rate (!) 21, weight 88.8 kg, SpO2 100 %. Physical Exam  Nursing note and vitals reviewed. Constitutional: He appears well-developed and well-nourished.  On oxygen via NRB  HENT:  Head:  Normocephalic and atraumatic.  Mouth/Throat: Oropharynx is clear and moist.  Eyes: Pupils are equal, round, and reactive to light. EOM are normal. No scleral icterus.  Neck: Normal range of motion. No JVD present.  Cardiovascular: Regular rhythm and normal heart sounds.  Regular tachycardia  Respiratory: Effort normal and breath sounds normal. He has no wheezes. He has no rales.  GI: Soft. He exhibits distension. There is no abdominal tenderness. There is no guarding.  Mild/moderate distention without tenderness  Musculoskeletal:        General: No edema.  Neurological: He is alert.  Skin: Skin is warm and dry. No rash noted.   Assessment/Plan: 1.  Acute kidney injury on chronic kidney disease stage III: Unfortunately, could not obtain a good history from the patient or his family members leading up to his current presentation.  Based on physical exam, he appears euvolemic to slightly hypovolemic and I will give him a challenge of intravenous fluids with isotonic sodium bicarbonate.  Will place a Foley catheter and monitor input/output and send off for urinalysis and urine electrolytes to help guide additional management including further lab testing based on sediment.  Renal ultrasound has been requested.  If decompensation or worsening labs noted, renal replacement therapy. 2.  Hyperkalemia: Secondary to acute kidney injury.  He appears to have some degree of baseline hyperkalemia with his potassium level from September of last year.  Begin treatment with Lokelma and isotonic sodium bicarbonate.  Will check a CPK level. 3.  Anion gap metabolic acidosis: This is likely secondary to acute kidney injury, begin isotonic sodium bicarbonate. 4.  Hypertension: Blood pressure marginally elevated, monitor at this time. 5.  Anemia: Significant hemoglobin/hematocrit drop noted since September of last year.  Will follow this closely and send off for iron studies and evaluation for plasma cell  dyscrasia.  Margalit Leece K. 04/05/2019, 12:20 PM

## 2019-04-06 ENCOUNTER — Inpatient Hospital Stay (HOSPITAL_COMMUNITY): Payer: Medicare HMO

## 2019-04-06 DIAGNOSIS — I6300 Cerebral infarction due to thrombosis of unspecified precerebral artery: Secondary | ICD-10-CM

## 2019-04-06 DIAGNOSIS — R7303 Prediabetes: Secondary | ICD-10-CM

## 2019-04-06 DIAGNOSIS — I214 Non-ST elevation (NSTEMI) myocardial infarction: Secondary | ICD-10-CM

## 2019-04-06 LAB — LIPID PANEL
Cholesterol: 160 mg/dL (ref 0–200)
HDL: 19 mg/dL — ABNORMAL LOW (ref >40)
LDL Cholesterol: 110 mg/dL — ABNORMAL HIGH (ref 0–99)
Total CHOL/HDL Ratio: 8.4 RATIO
Triglycerides: 154 mg/dL — ABNORMAL HIGH (ref <150)
VLDL: 31 mg/dL (ref 0–40)

## 2019-04-06 LAB — CBC
HCT: 24.4 % — ABNORMAL LOW (ref 39.0–52.0)
HCT: 31.6 % — ABNORMAL LOW (ref 39.0–52.0)
Hemoglobin: 7.8 g/dL — ABNORMAL LOW (ref 13.0–17.0)
Hemoglobin: 9.9 g/dL — ABNORMAL LOW (ref 13.0–17.0)
MCH: 28.4 pg (ref 26.0–34.0)
MCH: 27.7 pg (ref 26.0–34.0)
MCHC: 32 g/dL (ref 30.0–36.0)
MCHC: 31.3 g/dL (ref 30.0–36.0)
MCV: 88.7 fL (ref 80.0–100.0)
MCV: 88.3 fL (ref 80.0–100.0)
Platelets: 179 10*3/uL (ref 150–400)
Platelets: 182 10*3/uL (ref 150–400)
RBC: 2.75 MIL/uL — ABNORMAL LOW (ref 4.22–5.81)
RBC: 3.58 MIL/uL — ABNORMAL LOW (ref 4.22–5.81)
RDW: 13.2 % (ref 11.5–15.5)
RDW: 13.2 % (ref 11.5–15.5)
WBC: 8.3 10*3/uL (ref 4.0–10.5)
WBC: 10.1 10*3/uL (ref 4.0–10.5)
nRBC: 0 % (ref 0.0–0.2)
nRBC: 0 % (ref 0.0–0.2)

## 2019-04-06 LAB — RENAL FUNCTION PANEL
Albumin: 3.3 g/dL — ABNORMAL LOW (ref 3.5–5.0)
Anion gap: 14 (ref 5–15)
BUN: 154 mg/dL — ABNORMAL HIGH (ref 8–23)
CO2: 16 mmol/L — ABNORMAL LOW (ref 22–32)
Calcium: 8.7 mg/dL — ABNORMAL LOW (ref 8.9–10.3)
Chloride: 110 mmol/L (ref 98–111)
Creatinine, Ser: 9.56 mg/dL — ABNORMAL HIGH (ref 0.61–1.24)
GFR calc Af Amer: 6 mL/min — ABNORMAL LOW (ref >60)
GFR calc non Af Amer: 5 mL/min — ABNORMAL LOW (ref >60)
Glucose, Bld: 112 mg/dL — ABNORMAL HIGH (ref 70–99)
Phosphorus: 6.2 mg/dL — ABNORMAL HIGH (ref 2.5–4.6)
Potassium: 6.1 mmol/L — ABNORMAL HIGH (ref 3.5–5.1)
Sodium: 140 mmol/L (ref 135–145)

## 2019-04-06 LAB — BASIC METABOLIC PANEL
Anion gap: 17 — ABNORMAL HIGH (ref 5–15)
Anion gap: 18 — ABNORMAL HIGH (ref 5–15)
Anion gap: 16 — ABNORMAL HIGH (ref 5–15)
BUN: 147 mg/dL — ABNORMAL HIGH (ref 8–23)
BUN: 144 mg/dL — ABNORMAL HIGH (ref 8–23)
BUN: 138 mg/dL — ABNORMAL HIGH (ref 8–23)
CO2: 18 mmol/L — ABNORMAL LOW (ref 22–32)
CO2: 20 mmol/L — ABNORMAL LOW (ref 22–32)
CO2: 24 mmol/L (ref 22–32)
Calcium: 8.2 mg/dL — ABNORMAL LOW (ref 8.9–10.3)
Calcium: 8.3 mg/dL — ABNORMAL LOW (ref 8.9–10.3)
Calcium: 8.2 mg/dL — ABNORMAL LOW (ref 8.9–10.3)
Chloride: 106 mmol/L (ref 98–111)
Chloride: 105 mmol/L (ref 98–111)
Chloride: 103 mmol/L (ref 98–111)
Creatinine, Ser: 8.83 mg/dL — ABNORMAL HIGH (ref 0.61–1.24)
Creatinine, Ser: 8.54 mg/dL — ABNORMAL HIGH (ref 0.61–1.24)
Creatinine, Ser: 8.05 mg/dL — ABNORMAL HIGH (ref 0.61–1.24)
GFR calc Af Amer: 6 mL/min — ABNORMAL LOW (ref >60)
GFR calc Af Amer: 7 mL/min — ABNORMAL LOW (ref >60)
GFR calc Af Amer: 7 mL/min — ABNORMAL LOW (ref >60)
GFR calc non Af Amer: 5 mL/min — ABNORMAL LOW (ref >60)
GFR calc non Af Amer: 6 mL/min — ABNORMAL LOW (ref >60)
GFR calc non Af Amer: 6 mL/min — ABNORMAL LOW (ref >60)
Glucose, Bld: 94 mg/dL (ref 70–99)
Glucose, Bld: 97 mg/dL (ref 70–99)
Glucose, Bld: 109 mg/dL — ABNORMAL HIGH (ref 70–99)
Potassium: 5.7 mmol/L — ABNORMAL HIGH (ref 3.5–5.1)
Potassium: 5.4 mmol/L — ABNORMAL HIGH (ref 3.5–5.1)
Potassium: 5.5 mmol/L — ABNORMAL HIGH (ref 3.5–5.1)
Sodium: 141 mmol/L (ref 135–145)
Sodium: 143 mmol/L (ref 135–145)
Sodium: 143 mmol/L (ref 135–145)

## 2019-04-06 LAB — PROTEIN ELECTROPHORESIS, SERUM
A/G Ratio: 1 (ref 0.7–1.7)
Albumin ELP: 3.5 g/dL (ref 2.9–4.4)
Alpha-1-Globulin: 0.2 g/dL (ref 0.0–0.4)
Alpha-2-Globulin: 1.1 g/dL — ABNORMAL HIGH (ref 0.4–1.0)
Beta Globulin: 0.8 g/dL (ref 0.7–1.3)
Gamma Globulin: 1.5 g/dL (ref 0.4–1.8)
Globulin, Total: 3.6 g/dL (ref 2.2–3.9)
Total Protein ELP: 7.1 g/dL (ref 6.0–8.5)

## 2019-04-06 LAB — ANTINUCLEAR ANTIBODIES, IFA: ANA Ab, IFA: NEGATIVE

## 2019-04-06 LAB — TROPONIN I
Troponin I: 13.96 ng/mL (ref <0.03)
Troponin I: 17.34 ng/mL (ref <0.03)
Troponin I: 9.6 ng/mL (ref <0.03)

## 2019-04-06 LAB — TYPE AND SCREEN
ABO/RH(D): A POS
Antibody Screen: NEGATIVE

## 2019-04-06 LAB — KAPPA/LAMBDA LIGHT CHAINS
Kappa free light chain: 166.2 mg/L — ABNORMAL HIGH (ref 3.3–19.4)
Kappa, lambda light chain ratio: 2.05 — ABNORMAL HIGH (ref 0.26–1.65)
Lambda free light chains: 80.9 mg/L — ABNORMAL HIGH (ref 5.7–26.3)

## 2019-04-06 LAB — MPO/PR-3 (ANCA) ANTIBODIES
ANCA Proteinase 3: <3.5 U/mL (ref 0.0–3.5)
Myeloperoxidase Abs: <9 U/mL (ref 0.0–9.0)

## 2019-04-06 LAB — HEMOGLOBIN A1C
Hgb A1c MFr Bld: 6 % — ABNORMAL HIGH (ref 4.8–5.6)
Mean Plasma Glucose: 125.5 mg/dL

## 2019-04-06 LAB — UREA NITROGEN, URINE: Urea Nitrogen, Ur: 587 mg/dL

## 2019-04-06 LAB — ABO/RH: ABO/RH(D): A POS

## 2019-04-06 MED ORDER — SODIUM CHLORIDE 0.9 % IV SOLN
1.0000 g | INTRAVENOUS | Status: DC
  Administered 2019-04-06 – 2019-04-10 (×5): 1 g via INTRAVENOUS
  Filled 2019-04-06 (×5): qty 10

## 2019-04-06 MED ORDER — METOPROLOL TARTRATE 5 MG/5ML IV SOLN
5.0000 mg | Freq: Three times a day (TID) | INTRAVENOUS | Status: DC
  Administered 2019-04-06 – 2019-04-08 (×6): 5 mg via INTRAVENOUS
  Filled 2019-04-06 (×6): qty 5

## 2019-04-06 NOTE — Progress Notes (Signed)
Rehab Admissions Coordinator Note:  Per PT recommendation, this patient was screened by Nanine Means for appropriateness for an Inpatient Acute Rehab Consult.  At this time, we are recommending an Inpatient Rehab consult. AC will contact MD to request an IP rehab Consult Order.   Nanine Means 04/06/2019, 4:44 PM  I can be reached at [phone].

## 2019-04-06 NOTE — Progress Notes (Signed)
Carotid duplex has been completed.   Preliminary results in CV Proc.   Blanch Media 04/06/2019 1:21 PM

## 2019-04-06 NOTE — Progress Notes (Signed)
OT Cancellation Note  Patient Details Name: Gregory [name] MRN: [id number] DOB: 1951-01-27   Cancelled Treatment:     Reason Eval/Treat Not Completed: Medical issues which prohibit therapy. Pt with elevated troponin and potassium which continue to trend up.  OT will continue to f/u and evaluate when medically appropriate.   Norton Pastel, OTR/L 04/06/2019, 8:33 AM

## 2019-04-06 NOTE — TOC Initial Note (Signed)
Transition of Care [facility]) - Initial/Assessment Note    Patient Details  Name: Gregory [name] MRN: [id number] Date of Birth: 1951/12/14  Transition of Care [facility]) CM/SW Contact:    Baldemar Lenis, LCSW Phone Number: 04/06/2019, 1:10 PM  Clinical Narrative:  Patient from home with ex-wife, independent prior to admission. Per ex-wife, patient has been worsening over the past month, requiring assistance from her to move around the house over the past two weeks. Patient will have 24/7 assist at discharge, as there is also a daughter and a grandson who are CNAs that are available to provide assistance in the home.              Barriers to Discharge: Continued Medical Work up   Patient Goals and CMS Choice Patient states their goals for this hospitalization and ongoing recovery are:: patient unable to participate in goal setting at this time      Expected Discharge Plan and Services         Living arrangements for the past 2 months: Single Family Home                          Prior Living Arrangements/Services Living arrangements for the past 2 months: Single Family Home Lives with:: Spouse Patient language and need for interpreter reviewed:: No        Need for Family Participation in Patient Care: Yes (Comment) Care giver support system in place?: Yes (comment)   Criminal Activity/Legal Involvement Pertinent to Current Situation/Hospitalization: No - Comment as needed  Activities of Daily Living      Permission Sought/Granted                  Emotional Assessment   Attitude/Demeanor/Rapport: Unable to Assess Affect (typically observed): Unable to Assess Orientation: : Oriented to Self Alcohol / Substance Use: Not Applicable Psych Involvement: No (comment)  Admission diagnosis:  Hyperkalemia [E87.5] AKI (acute kidney injury) (HCC) [N17.9] Acute renal failure, unspecified acute renal failure type (HCC) [N17.9] Altered behavior [R46.89] Patient Active  Problem List   Diagnosis Date Noted  . Stroke (cerebrum) (HCC) 04/05/2019  . Demand ischemia (HCC) 04/05/2019  . 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 Pre-diabetes 09/05/2018  . CKD (chronic kidney disease) stage 3, GFR 30-59 ml/min (HCC) 09/05/2018  . Bilateral carotid artery stenosis 09/05/2018  . Essential hypertension 09/05/2018  . TIA (transient ischemic attack) 09/04/2018   PCP:  System, Pcp Not In Pharmacy:  No Pharmacies Listed    Social Determinants of Health (SDOH) Interventions    Readmission Risk Interventions No flowsheet data found.

## 2019-04-06 NOTE — Progress Notes (Signed)
STROKE TEAM PROGRESS NOTE   INTERVAL HISTORY I have personally reviewed history of presenting illness with the patient.  He states he is doing well his right-sided numbness has improved.  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  CT angiogram does show left carotid stenosis.  Review of patient's chart shows that he had cerebral catheter angiogram in September 2019 which showed only 50% extracranial carotid stenosis on either side.  Patient has been found to have hydronephrosis, renal failure as well as NSTEMI  Vitals:   04/05/19 2130 04/05/19 2330 04/06/19 0352 04/06/19 0831  BP: (!) 135/98 (!) 148/89 (!) 145/72 (!) 142/68  Pulse:  (!) 106 (!) 106 (!) 106  Resp: (!) 25 18 20 17   Temp: 98.3 F (36.8 C) 98 F (36.7 C) 98.1 F (36.7 C) 97.8 F (36.6 C)  TempSrc: Oral Oral Oral Oral  SpO2: 100% 96% 97% 98%  Weight:        CBC:  Recent Labs  Lab 04/05/19 0946  WBC 8.8  NEUTROABS 6.9  HGB 10.5*  HCT 36.3*  MCV 97.8  PLT 179    Basic Metabolic Panel:  Recent Labs  Lab 04/05/19 1313 04/06/19 0015  NA 138 140  K 5.9* 6.1*  CL 113* 110  CO2 10* 16*  GLUCOSE 131* 112*  BUN 158* 154*  CREATININE 10.14* 9.56*  CALCIUM 9.0 8.7*  PHOS  --  6.2*   Lipid Panel:     Component Value Date/Time   CHOL 160 04/06/2019 0015   TRIG 154 (H) 04/06/2019 0015   HDL 19 (L) 04/06/2019 0015   CHOLHDL 8.4 04/06/2019 0015   VLDL 31 04/06/2019 0015   LDLCALC 110 (H) 04/06/2019 0015   HgbA1c:  Lab Results  Component Value Date   HGBA1C 6.0 (H) 04/06/2019   Urine Drug Screen: No results found for: LABOPIA, COCAINSCRNUR, LABBENZ, AMPHETMU, THCU, LABBARB  Alcohol Level No results found for: [facility]  IMAGING Mr Maxine GlennMra Head Wo Contrast  Result Date: 04/05/2019 CLINICAL DATA:  Acute presentation with right-sided weakness and speech disturbance. EXAM: MRI HEAD WITHOUT CONTRAST MRA HEAD WITHOUT CONTRAST MRA NECK WITHOUT CONTRAST TECHNIQUE: Multiplanar, multiecho pulse sequences of the  brain and surrounding structures were obtained without intravenous contrast. Angiographic images of the Circle of Willis were obtained using MRA technique without intravenous contrast. Angiographic images of the neck were obtained using MRA technique without intravenous contrast. Carotid stenosis measurements (when applicable) are obtained utilizing NASCET criteria, using the distal internal carotid diameter as the denominator. COMPARISON:  Head CT same day.  MRI 09/03/2018. FINDINGS: MRI HEAD FINDINGS Brain: Diffusion imaging confirms acute infarction in the left insula and posterior frontal region. The area of involvement measures about 3.5 cm in size. No mass effect or visible hemorrhage. No hydrocephalus. Chronic small-vessel ischemic changes of the deep white matter seen elsewhere. Vascular: Major vessels at the base of the brain show flow. Skull and upper cervical spine: Negative Sinuses/Orbits: Clear/normal Other: None MRA HEAD FINDINGS Both internal carotid arteries are patent through the skull base and siphon regions with atherosclerotic narrowing and irregularity in the siphon regions. Supraclinoid internal carotid arteries are widely patent. The anterior and middle cerebral vessels appear to show flow without visible large or medium vessel occlusion. Distal branch vessels do show some atherosclerotic irregularity. The left vertebral artery supplies the basilar. No antegrade flow seen in the right vertebral artery. The basilar artery shows atherosclerotic narrowing and irregularity diffusely. Posterior circulation branch vessels are patent, with both  posterior cerebral arteries receiving most of there supply from the anterior circulation. There is long segment stenosis of the left P1 segment, but good flow seen distal to that. MRA NECK FINDINGS There is antegrade flow in both common carotid arteries. The study suffers from pronounced motion degradation. Antegrade flow is seen in both cervical internal  carotid arteries, but accurate evaluation of the carotid bifurcations is not possible. There is antegrade flow in the left vertebral artery but none seen on the right. IMPRESSION: 3.5 cm region of acute infarction affecting the left insula and posterior frontal region. No evidence of gross hemorrhage. MR angiography of the neck markedly degraded by motion. Both internal carotid arteries show antegrade flow. No antegrade flow in the right vertebral artery. Intracranial MR angiography does not show any large or medium vessel occlusion. Widespread atherosclerotic irregularity of the more distal branch vessels. Electronically Signed   By: Paulina Fusi M.D.   On: 04/05/2019 10:59   [name] Neck Wo Contrast  Result Date: 04/05/2019 CLINICAL DATA:  Acute presentation with right-sided weakness and speech disturbance. EXAM: MRI HEAD WITHOUT CONTRAST MRA HEAD WITHOUT CONTRAST MRA NECK WITHOUT CONTRAST TECHNIQUE: Multiplanar, multiecho pulse sequences of the brain and surrounding structures were obtained without intravenous contrast. Angiographic images of the Circle of Willis were obtained using MRA technique without intravenous contrast. Angiographic images of the neck were obtained using MRA technique without intravenous contrast. Carotid stenosis measurements (when applicable) are obtained utilizing NASCET criteria, using the distal internal carotid diameter as the denominator. COMPARISON:  Head CT same day.  MRI 09/03/2018. FINDINGS: MRI HEAD FINDINGS Brain: Diffusion imaging confirms acute infarction in the left insula and posterior frontal region. The area of involvement measures about 3.5 cm in size. No mass effect or visible hemorrhage. No hydrocephalus. Chronic small-vessel ischemic changes of the deep white matter seen elsewhere. Vascular: Major vessels at the base of the brain show flow. Skull and upper cervical spine: Negative Sinuses/Orbits: Clear/normal Other: None MRA HEAD FINDINGS Both internal carotid  arteries are patent through the skull base and siphon regions with atherosclerotic narrowing and irregularity in the siphon regions. Supraclinoid internal carotid arteries are widely patent. The anterior and middle cerebral vessels appear to show flow without visible large or medium vessel occlusion. Distal branch vessels do show some atherosclerotic irregularity. The left vertebral artery supplies the basilar. No antegrade flow seen in the right vertebral artery. The basilar artery shows atherosclerotic narrowing and irregularity diffusely. Posterior circulation branch vessels are patent, with both posterior cerebral arteries receiving most of there supply from the anterior circulation. There is long segment stenosis of the left P1 segment, but good flow seen distal to that. MRA NECK FINDINGS There is antegrade flow in both common carotid arteries. The study suffers from pronounced motion degradation. Antegrade flow is seen in both cervical internal carotid arteries, but accurate evaluation of the carotid bifurcations is not possible. There is antegrade flow in the left vertebral artery but none seen on the right. IMPRESSION: 3.5 cm region of acute infarction affecting the left insula and posterior frontal region. No evidence of gross hemorrhage. MR angiography of the neck markedly degraded by motion. Both internal carotid arteries show antegrade flow. No antegrade flow in the right vertebral artery. Intracranial MR angiography does not show any large or medium vessel occlusion. Widespread atherosclerotic irregularity of the more distal branch vessels. Electronically Signed   By: Paulina Fusi M.D.   On: 04/05/2019 10:59   Mr Brain Wo Contrast  Result Date:  04/05/2019 CLINICAL DATA:  Acute presentation with right-sided weakness and speech disturbance. EXAM: MRI HEAD WITHOUT CONTRAST MRA HEAD WITHOUT CONTRAST MRA NECK WITHOUT CONTRAST TECHNIQUE: Multiplanar, multiecho pulse sequences of the brain and surrounding  structures were obtained without intravenous contrast. Angiographic images of the Circle of Willis were obtained using MRA technique without intravenous contrast. Angiographic images of the neck were obtained using MRA technique without intravenous contrast. Carotid stenosis measurements (when applicable) are obtained utilizing NASCET criteria, using the distal internal carotid diameter as the denominator. COMPARISON:  Head CT same day.  MRI 09/03/2018. FINDINGS: MRI HEAD FINDINGS Brain: Diffusion imaging confirms acute infarction in the left insula and posterior frontal region. The area of involvement measures about 3.5 cm in size. No mass effect or visible hemorrhage. No hydrocephalus. Chronic small-vessel ischemic changes of the deep white matter seen elsewhere. Vascular: Major vessels at the base of the brain show flow. Skull and upper cervical spine: Negative Sinuses/Orbits: Clear/normal Other: None MRA HEAD FINDINGS Both internal carotid arteries are patent through the skull base and siphon regions with atherosclerotic narrowing and irregularity in the siphon regions. Supraclinoid internal carotid arteries are widely patent. The anterior and middle cerebral vessels appear to show flow without visible large or medium vessel occlusion. Distal branch vessels do show some atherosclerotic irregularity. The left vertebral artery supplies the basilar. No antegrade flow seen in the right vertebral artery. The basilar artery shows atherosclerotic narrowing and irregularity diffusely. Posterior circulation branch vessels are patent, with both posterior cerebral arteries receiving most of there supply from the anterior circulation. There is long segment stenosis of the left P1 segment, but good flow seen distal to that. MRA NECK FINDINGS There is antegrade flow in both common carotid arteries. The study suffers from pronounced motion degradation. Antegrade flow is seen in both cervical internal carotid arteries, but  accurate evaluation of the carotid bifurcations is not possible. There is antegrade flow in the left vertebral artery but none seen on the right. IMPRESSION: 3.5 cm region of acute infarction affecting the left insula and posterior frontal region. No evidence of gross hemorrhage. MR angiography of the neck markedly degraded by motion. Both internal carotid arteries show antegrade flow. No antegrade flow in the right vertebral artery. Intracranial MR angiography does not show any large or medium vessel occlusion. Widespread atherosclerotic irregularity of the more distal branch vessels. Electronically Signed   By: Paulina Fusi M.D.   On: 04/05/2019 10:59   US Renal  Result Date: 04/05/2019 CLINICAL DATA:  68 year old male with hypertension EXAM: RENAL / URINARY TRACT ULTRASOUND COMPLETE COMPARISON:  None. FINDINGS: Right Kidney: Length: 11.1 cm x 5.3 cm x 6.1 cm, 189 cc. Hydronephrosis. Flow confirmed in the hilum of the right kidney. Left Kidney: Length: 10.9 cm x 5.6 cm x 5.9 cm, 187 cc. Hydronephrosis. Flow confirmed in the hilum of the left kidney. Anechoic cystic structure on the inferior cortex measures 2.6 cm x 1.7 cm with through transmission and no internal color flow. Additional cystic lesion measures 1.1 cm x 1.1 cm x 1.0 cm, with through transmission and no internal flow. Bladder: Catheter in place within the bladder. IMPRESSION: Bilateral hydronephrosis. If there is concern for distal obstruction, CT may be indicated. Left-sided renal cysts. Electronically Signed   By: Gilmer Mor D.O.   On: 04/05/2019 15:39   Dg Chest Port 1 View  Result Date: 04/05/2019 CLINICAL DATA:  Altered behavior. EXAM: PORTABLE CHEST 1 VIEW COMPARISON:  None. FINDINGS: Lungs are adequately inflated and otherwise  clear. Borderline cardiomegaly. Mediastinum is within normal. Fusion hardware over the lower cervical spine intact. Minimal degenerative change of the spine. IMPRESSION: No acute findings. Electronically Signed    By: Elberta Fortis M.D.   On: 04/05/2019 11:32   Ct Renal Stone Study  Result Date: 04/06/2019 CLINICAL DATA:  Hydronephrosis. EXAM: CT ABDOMEN AND PELVIS WITHOUT CONTRAST TECHNIQUE: Multidetector CT imaging of the abdomen and pelvis was performed following the standard protocol without IV contrast. COMPARISON:  Ultrasound of April 05, 2019. FINDINGS: Lower chest: No acute abnormality. Hepatobiliary: No focal liver abnormality is seen. No gallstones, gallbladder wall thickening, or biliary dilatation. Pancreas: Unremarkable. No pancreatic ductal dilatation or surrounding inflammatory changes. Spleen: Normal in size without focal abnormality. Adrenals/Urinary Tract: Adrenal glands appear normal. Moderate bilateral hydroureteronephrosis is noted without evidence of renal or ureteral calculi. Catheter is noted in urinary bladder which appears to be decompressed. However, there appears to be significant diffuse wall thickening of urinary bladder which may be due to lack of distension, but is concerning for cystitis. Clinical correlation is recommended. Stomach/Bowel: Stomach is within normal limits. Appendix appears normal. No evidence of bowel wall thickening, distention, or inflammatory changes. Vascular/Lymphatic: Aortic atherosclerosis. No enlarged abdominal or pelvic lymph nodes. Reproductive: Prostate is unremarkable. Other: No abdominal wall hernia or abnormality. No abdominopelvic ascites. Musculoskeletal: No acute or significant osseous findings. IMPRESSION: Moderate bilateral hydroureteronephrosis is noted without evidence of obstructing calculus. Diffuse wall thickening of urinary bladder is noted which may be due to lack of distension, but is concerning for cystitis. Clinical correlation is recommended. Catheter is noted within urinary bladder. Aortic Atherosclerosis (ICD10-I70.0). Electronically Signed   By: Lupita Raider M.D.   On: 04/06/2019 09:24   Ct Head Code Stroke Wo Contrast  Result Date:  04/05/2019 CLINICAL DATA:  Code stroke. Right-sided weakness. Speech disturbance. Last seen normal 0730 hours. EXAM: CT HEAD WITHOUT CONTRAST TECHNIQUE: Contiguous axial images were obtained from the base of the skull through the vertex without intravenous contrast. COMPARISON:  09/03/2018 FINDINGS: Brain: No CT evidence of acute infarction. Chronic small-vessel ischemic changes of the cerebral hemispheric white matter. No mass lesion, hemorrhage, hydrocephalus or extra-axial collection. Vascular: No definite hyperdense vessel. One could question slight hyperdensity in the left M1 segment. Skull: Negative Sinuses/Orbits: Clear/normal Other: None ASPECTS ([place] Stroke Program Early CT Score) - Ganglionic level infarction (caudate, lentiform nuclei, internal capsule, insula, M1-M3 cortex): 6 - Supraganglionic infarction (M4-M6 cortex): 3 Total score (0-10 with 10 being normal): 9 IMPRESSION: 1. Possible early gray-white differentiation loss in the insula on the left. Chronic small-vessel ischemic changes. 2. One could question slight hyperdensity in the left M1 segment. This is not definite. 3. ASPECTS is 9 4. These results were communicated to Dr. Amada Jupiter at 10:01 amon 4/15/2020by text page via the [facility] messaging system. Electronically Signed   By: Paulina Fusi M.D.   On: 04/05/2019 10:04   2D Echocardiogram   1. The left ventricle has normal systolic function, with an ejection fraction of 55-60%. The cavity size was normal. There is moderately increased left ventricular wall thickness. Left ventricular diastolic Doppler parameters are consistent with  impaired relaxation. No evidence of left ventricular regional wall motion abnormalities.  2. The right ventricle has normal systolic function. The cavity was normal. There is no increase in right ventricular wall thickness.  3. Mild thickening of the mitral valve leaflet. Mild calcification of the mitral valve leaflet. There is moderate mitral annular  calcification present.  4. The aortic valve has  an indeterminate number of cusps. Moderate thickening of the aortic valve. Moderate calcification of the aortic valve. Mild-moderate stenosis of the aortic valve.  5. The aortic root is normal in size and structure.  6. No intracardiac thrombi or masses were visualized.  PHYSICAL EXAM Pleasant elderly Caucasian male currently not in distress. . Afebrile. Head is nontraumatic. Neck is supple without bruit.    Cardiac exam no murmur or gallop. Lungs are clear to auscultation. Distal pulses are well felt. Neurological Exam : Patient is awake alert he has diminished attention, registration and recall.  Speech is nonfluent and hesitant but can answer his names and short sentences only.  He has easy distractibility.  No dysarthria.  Follows simple midline and one-step commands only.  Extraocular movements appear full range.  He blinks to threat bilaterally.  Mild right lower facial asymmetry when he smiles.  Tongue midline.  Motor system exam mild right-sided weakness but effort is variable.  Mild weakness of right grip and intrinsic hand muscles.  Orbits left over right upper extremity.  Moves lower extremities fairly well against gravity but left more than right.  Sensation and reflexes are preserved bilaterally.  Gait not tested. ASSESSMENT/PLAN [name] is a 68 y.o. male with history of CKD stage III, HTN, HLD presenting who had some R sided numbness, then found unresponsive on the floor the following morning with Cr 10.48 and K 6.8 on arrival. Positive for NSTEMI.  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 Chronic small vessel disease. ? LM1 hyperdensity. ASPECTS 9    MRI  L insular and posterior frontal infarct  MRA  head no LVO, no medium occlusion. Widespread  atherosclerosis   MRA  Neck degraded. B ICA flow antegrade. No antegrade flow R VA  2D Echo EF 55-60%. No source of embolus   Angio 08/2018 showed B ICA 50% stenoses   Carotid dopplers pending   LDL 110  HgbA1c 6.0  Heparin 5000 units sq tid for VTE prophylaxis Diet Order            Diet NPO time specified  Diet effective now               aspirin 81 mg daily and clopidogrel 75 mg daily prior to admission, now on aspirin 300 mg suppository daily. Hold plavix until input from renal for possible nephrostomy tubes.  Therapy recommendations:  CIR  Disposition:  pending   Hypertension  Stable . From stroke standpoint - Permissive hypertension (OK if < 220/120) but gradually normalize in 5-7 days . Long-term BP goal normotensive  Hyperlipidemia  Home meds:  lipitor 80  Resume intensive statin in hospital once able to swallow  LDL 110, goal < 70  Continue statin at discharge  Other Stroke Risk Factors  Obesity, Body mass index is 33.6 kg/m., recommend weight loss, diet and exercise as appropriate   Hx stroke/TIA  08/2018 - Probable left hemisphericTIA: Likely embolic based on previous infarcts. Placed on aspirin 81 and 75 awaiting VVS consult for ICA dz  Coronary artery disease -Acute  NSTEMI this admission w/ elevated troponins (10.69-13.96-17.34) - not a candidate for intervention - medical management. 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  He presented with aphasia and right-sided numbness due to left MCA branch infarct likely of embolic etiology.  Possibilities include proximal left carotid stenosis with artery to artery embolism versus cardiogenic embolism from A. fib.  Patient also has acute renal failure and may need consideration of nephrostomy hence we will hold off on Plavix till definite decision about management of obstructive renal failure is made.  Start aspirin for now.  May consider subsequent work-up with TEE and loop recorder if carotid ultrasound does not show significant symptomatic left carotid stenosis.  Discussed with patient and Dr. Alycia Rossetti.  Greater than 50% time during this 35-minute visit was spent in counseling and coordination of care about his embolic stroke and discussion about evaluation and treatment plan.  Delia Heady, MD Medical Director [facility] Pager: [phone] 04/06/2019 12:37 PM   To contact Stroke Continuity provider, please refer to [email or web address]. After hours, contact General Neurology

## 2019-04-06 NOTE — Progress Notes (Signed)
Progress Note  Patient Name: Gregory [name] Date of Encounter: 04/06/2019  Primary Cardiologist: No primary care provider on file.   Subjective   Unable to provide hx because of aphasia  Inpatient Medications    Scheduled Meds:  aspirin  300 mg Rectal Daily   Or   aspirin  325 mg Oral Daily   heparin  5,000 Units Subcutaneous Q8H   metoprolol tartrate  2.5 mg Intravenous Q6H   sodium zirconium cyclosilicate  10 g Oral Q6H   Continuous Infusions:   sodium bicarbonate (isotonic) infusion in sterile water 125 mL/hr at 04/06/19 0552   PRN Meds: acetaminophen **OR** acetaminophen (TYLENOL) oral liquid 160 mg/5 mL **OR** acetaminophen   Vital Signs    Vitals:   04/05/19 2130 04/05/19 2330 04/06/19 0352 04/06/19 0831  BP: (!) 135/98 (!) 148/89 (!) 145/72 (!) 142/68  Pulse:  (!) 106 (!) 106 (!) 106  Resp: (!) 25 18 20 17   Temp: 98.3 F (36.8 C) 98 F (36.7 C) 98.1 F (36.7 C) 97.8 F (36.6 C)  TempSrc: Oral Oral Oral Oral  SpO2: 100% 96% 97% 98%  Weight:        Intake/Output Summary (Last 24 hours) at 04/06/2019 1116 Last data filed at 04/06/2019 0900 Gross per 24 hour  Intake 0 ml  Output 1450 ml  Net -1450 ml   Last 3 Weights 04/05/2019 09/09/2018 09/04/2018  Weight (lbs) 195 lb 12.3 oz 220 lb 235 lb 7.2 oz  Weight (kg) 88.8 kg 99.791 kg 106.8 kg      Telemetry    MAT, heart rate 100-120's - Personally Reviewed  ECG    Sinus tachycardia with PACs, nonspecific ST and T wave abnormality.  IVCD and ST changes improved from yesterday - Personally Reviewed  Physical Exam  Awake and alert male, aphasic GEN: No acute distress.   Neck:  Large neck, unable to see JVP Cardiac:  Irregular, 2/6 harsh systolic murmur at the left sternal border Respiratory:  Diffuse rhonchi bilaterally GI: Soft, nontender, non-distended  MS: No edema; No deformity. Neuro:   Right-sided weakness, aphasia  Labs    Chemistry Recent Labs  Lab 04/05/19 0946 04/05/19 0953  04/05/19 1313 04/06/19 0015  NA 138  --  138 140  K 6.8*  --  5.9* 6.1*  CL 112*  --  113* 110  CO2 10*  --  10* 16*  GLUCOSE 115*  --  131* 112*  BUN 158*  --  158* 154*  CREATININE 10.48* 10.90* 10.14* 9.56*  CALCIUM 9.1  --  9.0 8.7*  PROT 7.8  --   --   --   ALBUMIN 3.9  --   --  3.3*  AST 43*  --   --   --   ALT 24  --   --   --   ALKPHOS 133*  --   --   --   BILITOT 0.6  --   --   --   GFRNONAA 4*  --  5* 5*  GFRAA 5*  --  5* 6*  ANIONGAP 16*  --  15 14     Hematology Recent Labs  Lab 04/05/19 0946  WBC 8.8  RBC 3.71*  HGB 10.5*  HCT 36.3*  MCV 97.8  MCH 28.3  MCHC 28.9*  RDW 13.4  PLT 179    Cardiac Enzymes Recent Labs  Lab 04/05/19 1055 04/05/19 1830 04/06/19 0015 04/06/19 0855  TROPONINI 3.29* 10.69* 13.96* 17.34*  Recent Labs  Lab 04/05/19 1118  TROPIPOC 3.99*     BNPNo results for input(s): BNP, PROBNP in the last 168 hours.   DDimer No results for input(s): DDIMER in the last 168 hours.   Radiology    [name] Head Wo Contrast  Result Date: 04/05/2019 CLINICAL DATA:  Acute presentation with right-sided weakness and speech disturbance. EXAM: MRI HEAD WITHOUT CONTRAST MRA HEAD WITHOUT CONTRAST MRA NECK WITHOUT CONTRAST TECHNIQUE: Multiplanar, multiecho pulse sequences of the brain and surrounding structures were obtained without intravenous contrast. Angiographic images of the Circle of Willis were obtained using MRA technique without intravenous contrast. Angiographic images of the neck were obtained using MRA technique without intravenous contrast. Carotid stenosis measurements (when applicable) are obtained utilizing NASCET criteria, using the distal internal carotid diameter as the denominator. COMPARISON:  Head CT same day.  MRI 09/03/2018. FINDINGS: MRI HEAD FINDINGS Brain: Diffusion imaging confirms acute infarction in the left insula and posterior frontal region. The area of involvement measures about 3.5 cm in size. No mass effect or  visible hemorrhage. No hydrocephalus. Chronic small-vessel ischemic changes of the deep white matter seen elsewhere. Vascular: Major vessels at the base of the brain show flow. Skull and upper cervical spine: Negative Sinuses/Orbits: Clear/normal Other: None MRA HEAD FINDINGS Both internal carotid arteries are patent through the skull base and siphon regions with atherosclerotic narrowing and irregularity in the siphon regions. Supraclinoid internal carotid arteries are widely patent. The anterior and middle cerebral vessels appear to show flow without visible large or medium vessel occlusion. Distal branch vessels do show some atherosclerotic irregularity. The left vertebral artery supplies the basilar. No antegrade flow seen in the right vertebral artery. The basilar artery shows atherosclerotic narrowing and irregularity diffusely. Posterior circulation branch vessels are patent, with both posterior cerebral arteries receiving most of there supply from the anterior circulation. There is long segment stenosis of the left P1 segment, but good flow seen distal to that. MRA NECK FINDINGS There is antegrade flow in both common carotid arteries. The study suffers from pronounced motion degradation. Antegrade flow is seen in both cervical internal carotid arteries, but accurate evaluation of the carotid bifurcations is not possible. There is antegrade flow in the left vertebral artery but none seen on the right. IMPRESSION: 3.5 cm region of acute infarction affecting the left insula and posterior frontal region. No evidence of gross hemorrhage. MR angiography of the neck markedly degraded by motion. Both internal carotid arteries show antegrade flow. No antegrade flow in the right vertebral artery. Intracranial MR angiography does not show any large or medium vessel occlusion. Widespread atherosclerotic irregularity of the more distal branch vessels. Electronically Signed   By: Paulina Fusi M.D.   On: 04/05/2019 10:59     [name] Neck Wo Contrast  Result Date: 04/05/2019 CLINICAL DATA:  Acute presentation with right-sided weakness and speech disturbance. EXAM: MRI HEAD WITHOUT CONTRAST MRA HEAD WITHOUT CONTRAST MRA NECK WITHOUT CONTRAST TECHNIQUE: Multiplanar, multiecho pulse sequences of the brain and surrounding structures were obtained without intravenous contrast. Angiographic images of the Circle of Willis were obtained using MRA technique without intravenous contrast. Angiographic images of the neck were obtained using MRA technique without intravenous contrast. Carotid stenosis measurements (when applicable) are obtained utilizing NASCET criteria, using the distal internal carotid diameter as the denominator. COMPARISON:  Head CT same day.  MRI 09/03/2018. FINDINGS: MRI HEAD FINDINGS Brain: Diffusion imaging confirms acute infarction in the left insula and posterior frontal region. The area of involvement  measures about 3.5 cm in size. No mass effect or visible hemorrhage. No hydrocephalus. Chronic small-vessel ischemic changes of the deep white matter seen elsewhere. Vascular: Major vessels at the base of the brain show flow. Skull and upper cervical spine: Negative Sinuses/Orbits: Clear/normal Other: None MRA HEAD FINDINGS Both internal carotid arteries are patent through the skull base and siphon regions with atherosclerotic narrowing and irregularity in the siphon regions. Supraclinoid internal carotid arteries are widely patent. The anterior and middle cerebral vessels appear to show flow without visible large or medium vessel occlusion. Distal branch vessels do show some atherosclerotic irregularity. The left vertebral artery supplies the basilar. No antegrade flow seen in the right vertebral artery. The basilar artery shows atherosclerotic narrowing and irregularity diffusely. Posterior circulation branch vessels are patent, with both posterior cerebral arteries receiving most of there supply from the anterior  circulation. There is long segment stenosis of the left P1 segment, but good flow seen distal to that. MRA NECK FINDINGS There is antegrade flow in both common carotid arteries. The study suffers from pronounced motion degradation. Antegrade flow is seen in both cervical internal carotid arteries, but accurate evaluation of the carotid bifurcations is not possible. There is antegrade flow in the left vertebral artery but none seen on the right. IMPRESSION: 3.5 cm region of acute infarction affecting the left insula and posterior frontal region. No evidence of gross hemorrhage. MR angiography of the neck markedly degraded by motion. Both internal carotid arteries show antegrade flow. No antegrade flow in the right vertebral artery. Intracranial MR angiography does not show any large or medium vessel occlusion. Widespread atherosclerotic irregularity of the more distal branch vessels. Electronically Signed   By: Paulina Fusi M.D.   On: 04/05/2019 10:59   Mr Brain Wo Contrast  Result Date: 04/05/2019 CLINICAL DATA:  Acute presentation with right-sided weakness and speech disturbance. EXAM: MRI HEAD WITHOUT CONTRAST MRA HEAD WITHOUT CONTRAST MRA NECK WITHOUT CONTRAST TECHNIQUE: Multiplanar, multiecho pulse sequences of the brain and surrounding structures were obtained without intravenous contrast. Angiographic images of the Circle of Willis were obtained using MRA technique without intravenous contrast. Angiographic images of the neck were obtained using MRA technique without intravenous contrast. Carotid stenosis measurements (when applicable) are obtained utilizing NASCET criteria, using the distal internal carotid diameter as the denominator. COMPARISON:  Head CT same day.  MRI 09/03/2018. FINDINGS: MRI HEAD FINDINGS Brain: Diffusion imaging confirms acute infarction in the left insula and posterior frontal region. The area of involvement measures about 3.5 cm in size. No mass effect or visible hemorrhage. No  hydrocephalus. Chronic small-vessel ischemic changes of the deep white matter seen elsewhere. Vascular: Major vessels at the base of the brain show flow. Skull and upper cervical spine: Negative Sinuses/Orbits: Clear/normal Other: None MRA HEAD FINDINGS Both internal carotid arteries are patent through the skull base and siphon regions with atherosclerotic narrowing and irregularity in the siphon regions. Supraclinoid internal carotid arteries are widely patent. The anterior and middle cerebral vessels appear to show flow without visible large or medium vessel occlusion. Distal branch vessels do show some atherosclerotic irregularity. The left vertebral artery supplies the basilar. No antegrade flow seen in the right vertebral artery. The basilar artery shows atherosclerotic narrowing and irregularity diffusely. Posterior circulation branch vessels are patent, with both posterior cerebral arteries receiving most of there supply from the anterior circulation. There is long segment stenosis of the left P1 segment, but good flow seen distal to that. MRA NECK FINDINGS There is antegrade flow  in both common carotid arteries. The study suffers from pronounced motion degradation. Antegrade flow is seen in both cervical internal carotid arteries, but accurate evaluation of the carotid bifurcations is not possible. There is antegrade flow in the left vertebral artery but none seen on the right. IMPRESSION: 3.5 cm region of acute infarction affecting the left insula and posterior frontal region. No evidence of gross hemorrhage. MR angiography of the neck markedly degraded by motion. Both internal carotid arteries show antegrade flow. No antegrade flow in the right vertebral artery. Intracranial MR angiography does not show any large or medium vessel occlusion. Widespread atherosclerotic irregularity of the more distal branch vessels. Electronically Signed   By: Paulina Fusi M.D.   On: 04/05/2019 10:59   US Renal  Result  Date: 04/05/2019 CLINICAL DATA:  68 year old male with hypertension EXAM: RENAL / URINARY TRACT ULTRASOUND COMPLETE COMPARISON:  None. FINDINGS: Right Kidney: Length: 11.1 cm x 5.3 cm x 6.1 cm, 189 cc. Hydronephrosis. Flow confirmed in the hilum of the right kidney. Left Kidney: Length: 10.9 cm x 5.6 cm x 5.9 cm, 187 cc. Hydronephrosis. Flow confirmed in the hilum of the left kidney. Anechoic cystic structure on the inferior cortex measures 2.6 cm x 1.7 cm with through transmission and no internal color flow. Additional cystic lesion measures 1.1 cm x 1.1 cm x 1.0 cm, with through transmission and no internal flow. Bladder: Catheter in place within the bladder. IMPRESSION: Bilateral hydronephrosis. If there is concern for distal obstruction, CT may be indicated. Left-sided renal cysts. Electronically Signed   By: Gilmer Mor D.O.   On: 04/05/2019 15:39   Dg Chest Port 1 View  Result Date: 04/05/2019 CLINICAL DATA:  Altered behavior. EXAM: PORTABLE CHEST 1 VIEW COMPARISON:  None. FINDINGS: Lungs are adequately inflated and otherwise clear. Borderline cardiomegaly. Mediastinum is within normal. Fusion hardware over the lower cervical spine intact. Minimal degenerative change of the spine. IMPRESSION: No acute findings. Electronically Signed   By: Elberta Fortis M.D.   On: 04/05/2019 11:32   Ct Renal Stone Study  Result Date: 04/06/2019 CLINICAL DATA:  Hydronephrosis. EXAM: CT ABDOMEN AND PELVIS WITHOUT CONTRAST TECHNIQUE: Multidetector CT imaging of the abdomen and pelvis was performed following the standard protocol without IV contrast. COMPARISON:  Ultrasound of April 05, 2019. FINDINGS: Lower chest: No acute abnormality. Hepatobiliary: No focal liver abnormality is seen. No gallstones, gallbladder wall thickening, or biliary dilatation. Pancreas: Unremarkable. No pancreatic ductal dilatation or surrounding inflammatory changes. Spleen: Normal in size without focal abnormality. Adrenals/Urinary Tract:  Adrenal glands appear normal. Moderate bilateral hydroureteronephrosis is noted without evidence of renal or ureteral calculi. Catheter is noted in urinary bladder which appears to be decompressed. However, there appears to be significant diffuse wall thickening of urinary bladder which may be due to lack of distension, but is concerning for cystitis. Clinical correlation is recommended. Stomach/Bowel: Stomach is within normal limits. Appendix appears normal. No evidence of bowel wall thickening, distention, or inflammatory changes. Vascular/Lymphatic: Aortic atherosclerosis. No enlarged abdominal or pelvic lymph nodes. Reproductive: Prostate is unremarkable. Other: No abdominal wall hernia or abnormality. No abdominopelvic ascites. Musculoskeletal: No acute or significant osseous findings. IMPRESSION: Moderate bilateral hydroureteronephrosis is noted without evidence of obstructing calculus. Diffuse wall thickening of urinary bladder is noted which may be due to lack of distension, but is concerning for cystitis. Clinical correlation is recommended. Catheter is noted within urinary bladder. Aortic Atherosclerosis (ICD10-I70.0). Electronically Signed   By: Lupita Raider M.D.   On: 04/06/2019 09:24  Ct Head Code Stroke Wo Contrast  Result Date: 04/05/2019 CLINICAL DATA:  Code stroke. Right-sided weakness. Speech disturbance. Last seen normal 0730 hours. EXAM: CT HEAD WITHOUT CONTRAST TECHNIQUE: Contiguous axial images were obtained from the base of the skull through the vertex without intravenous contrast. COMPARISON:  09/03/2018 FINDINGS: Brain: No CT evidence of acute infarction. Chronic small-vessel ischemic changes of the cerebral hemispheric white matter. No mass lesion, hemorrhage, hydrocephalus or extra-axial collection. Vascular: No definite hyperdense vessel. One could question slight hyperdensity in the left M1 segment. Skull: Negative Sinuses/Orbits: Clear/normal Other: None ASPECTS ([place] Stroke  Program Early CT Score) - Ganglionic level infarction (caudate, lentiform nuclei, internal capsule, insula, M1-M3 cortex): 6 - Supraganglionic infarction (M4-M6 cortex): 3 Total score (0-10 with 10 being normal): 9 IMPRESSION: 1. Possible early gray-white differentiation loss in the insula on the left. Chronic small-vessel ischemic changes. 2. One could question slight hyperdensity in the left M1 segment. This is not definite. 3. ASPECTS is 9 4. These results were communicated to Dr. Amada Jupiter at 10:01 amon 4/15/2020by text page via the [facility] messaging system. Electronically Signed   By: Paulina Fusi M.D.   On: 04/05/2019 10:04    Cardiac Studies   2D Echo: IMPRESSIONS    1. The left ventricle has normal systolic function, with an ejection fraction of 55-60%. The cavity size was normal. There is moderately increased left ventricular wall thickness. Left ventricular diastolic Doppler parameters are consistent with  impaired relaxation. No evidence of left ventricular regional wall motion abnormalities.  2. The right ventricle has normal systolic function. The cavity was normal. There is no increase in right ventricular wall thickness.  3. Mild thickening of the mitral valve leaflet. Mild calcification of the mitral valve leaflet. There is moderate mitral annular calcification present.  4. The aortic valve has an indeterminate number of cusps. Moderate thickening of the aortic valve. Moderate calcification of the aortic valve. Mild-moderate stenosis of the aortic valve.  5. The aortic root is normal in size and structure.  6. No intracardiac thrombi or masses were visualized.  Patient Profile     68 y.o. male with acute ischemic stroke, severe acute kidney injury with obstructive uropathy, and non-STEMI  Assessment & Plan    1.  Non-STEMI: The patient has not experienced any chest pain.  LV function is normal by echo with no regional wall motion abnormalities.  He has a significant troponin  elevation but clearly his presentation is complicated by acute stroke and acute renal failure with creatinine of 10 mg/dL.  He is not a candidate for any invasive evaluation.  He will require medical treatment/supportive care.  Probably best to avoid aggressive anticoagulation in this patient with obstructive uropathy, anemia, and hematuria.  2.  Multifocal atrial tachycardia: Heart rhythm is stable with a heart rate in the low 100s.  Would continue with IV metoprolol.  We will follow with you.  When he is able to take oral medicine will start him on a high intensity statin drug.  Otherwise treatment is limited at this point because of his comorbid conditions outlined above.  For questions or updates, please contact [facility] Please consult [email or web address] for contact info under     Signed, Tonny Bollman, MD  04/06/2019, 11:16 AM

## 2019-04-06 NOTE — Evaluation (Signed)
Speech Language Pathology Evaluation Patient Details Name: Gregory [name] MRN: [id number] DOB: 1951-05-13 Today's Date: 04/06/2019 Time: 6606-3016 SLP Time Calculation (min) (ACUTE ONLY): 19 min  Problem List:  Patient Active Problem List   Diagnosis Date Noted  . Stroke (cerebrum) (HCC) 04/05/2019  . Demand ischemia (HCC) 04/05/2019  . 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 Pre-diabetes 09/05/2018  . CKD (chronic kidney disease) stage 3, GFR 30-59 ml/min (HCC) 09/05/2018  . Bilateral carotid artery stenosis 09/05/2018  . Essential hypertension 09/05/2018  . TIA (transient ischemic attack) 09/04/2018   Past Medical History:  Past Medical History:  Diagnosis Date  . AKI (acute kidney injury) (HCC)   . CKD (chronic kidney disease) stage 2, GFR 60-89 ml/min   . CVA (cerebral vascular accident) (HCC)   . Hyperlipidemia   . Hypertension   . PVD (peripheral vascular disease) (HCC)    Past Surgical History:  Past Surgical History:  Procedure Laterality Date  . AORTIC ARCH ANGIOGRAPHY N/A 09/09/2018   Procedure: AORTIC ARCH ANGIOGRAPHY;  Surgeon: Sherren Kerns, MD;  Location: [facility] IN[facility]ASIVE [facility];  Service: Cardiovascular;  Laterality: N/A;  . CAROTID ANGIOGRAPHY Bilateral 09/09/2018   Procedure: CAROTID ANGIOGRAPHY;  Surgeon: Sherren Kerns, MD;  Location: [facility] INVASIVE CV LAB;  Service: Cardiovascular;  Laterality: Bilateral;   HPI:  Pt is a 68 y.o. male with medical history significant of chronic kidney disease stage III, hypertension, hyperlipidemia, who presented to the hospital with chief complaint of sudden onset right-sided weakness and inability to talk.  MRI of the brain revealed 3.5 cm region of acute infarction affecting the left insula and posterior frontal region.   Assessment / Plan / Recommendation Clinical Impression  Pt presents with severe expressive aphasia characterized by impairments in auditory comprehension and verbal expression with more  pronounced deficits in expression. Pt was able to follow 1-step commands and answer simple yes/no questions using gestures but he demostrated difficulty with auditory comprehension above this level. He vocalized throughout the assessment and multiple attempts were made to communicate verbally. However, he was unable to complete any expressive language tasks despite therapeutic support from SLP. Motor speech skills could not be evaluated due to his absent verbal output. Skilled SLP services are clinically indicated at this time to improve language function.     SLP Assessment  SLP Recommendation/Assessment: Patient needs continued Speech Lanaguage Pathology Services SLP Visit Diagnosis: Aphasia (R47.01)    Follow Up Recommendations  Inpatient Rehab    Frequency and Duration min 2x/week  2 weeks      SLP Evaluation Cognition  Overall Cognitive Status: Difficult to assess(Due to aphasia)       Comprehension  Auditory Comprehension Overall Auditory Comprehension: Impaired Yes/No Questions: Impaired Basic Biographical Questions: (5/5) Complex Questions: (2/5) Commands: Impaired One Step Basic Commands: (3/4) Two Step Basic Commands: (0/4) Visual Recognition/Discrimination Discrimination: Exceptions to [facility] Pictures: (0/5) Reading Comprehension Reading Status: Not tested    Expression Expression Primary Mode of Expression: Nonverbal - gestures Verbal Expression Overall Verbal Expression: Impaired Initiation: Impaired Automatic Speech: Counting;Day of week(Counting: 0/10; Days: 0/7) Level of Generative/Spontaneous Verbalization: (No spontaneous verbalization) Repetition: Impaired Level of Impairment: Word level(0/3) Naming: Impairment Responsive: (0/5) Confrontation: (0/.4) Pictures: (0/5) Convergent: Not tested Divergent: Not tested Verbal Errors: Aware of errors(Vocalization noted without verbalization. ) Pragmatics: No impairment   Oral / Motor  Oral Motor/Sensory  Function Overall Oral Motor/Sensory Function: Within functional limits Motor Speech Overall Motor Speech: Other (comment)(Unable to assess due to absent verbal output)  Gregory [name] I. Gregory ClockPhillips, MS, CCC-SLP Acute Rehabilitation Services Office number [phone] Pager [phone]                     Gregory [name] 04/06/2019, 11:26 AM

## 2019-04-06 NOTE — Progress Notes (Signed)
SLP Cancellation Note  Patient Details Name: Gregory [name] MRN: [id number] DOB: [birth year]   Cancelled treatment:       Reason Eval/Treat Not Completed: Patient at procedure or test/unavailable. Pt currently with physical therapy. SLP will follow up and re-attempt as able.   [place] Cawood I. Vear Clock, MS, CCC-SLP Acute Rehabilitation Services Office number [phone] Pager [phone]  Scheryl Marten 04/06/2019, 9:49 AM

## 2019-04-06 NOTE — Progress Notes (Signed)
Patient ID: Gregory [name], male   DOB: 04/02/1951, 68 y.o.   MRN: [id number] [facility] Progress Note   Assessment/ Plan:   1.  Acute kidney injury on chronic kidney disease stage III:  Available data pointing to obstructive uropathy-awaiting input from urology and suspect that he will most likely need bilateral nephrostomy tubes in the setting of his elevated troponin that may preclude cystoscopy/ureteral stents.  Continue supportive medical management with isotonic sodium bicarbonate and potassium lowering. 2.  Hyperkalemia: Secondary to acute kidney injury/obstruction.    No evidence of rhabdomyolysis, will continue medical management at this time. 3.  Anion gap metabolic acidosis: This is likely secondary to acute kidney injury/distal RTA from obstruction, proving with isotonic sodium bicarbonate. 4.  Hypertension: Blood pressure marginally elevated, will continue to follow closely. 5.  Anemia: Significant hemoglobin/hematocrit drop noted since September of last year. Iron stores replete with hematuria noted in urine collection bag/UA yesterday-this raises suspicion for severe cystitis versus malignancy; serologies pending for GN. 6.  Elevated/rising troponin levels: Medical management at this time in the setting of acute kidney injury 7.  Acute CVA with right-sided weakness/expressive aphasia  Subjective:   Unable to voice any problems, working with speech therapy   Objective:   BP (!) 142/68 (BP Location: Right Arm)   Pulse (!) 106   Temp 97.8 F (36.6 C) (Oral)   Resp 17   Wt 88.8 kg   SpO2 98%   BMI 33.60 kg/m   Intake/Output Summary (Last 24 hours) at 04/06/2019 1100 Last data filed at 04/06/2019 0900 Gross per 24 hour  Intake 0 ml  Output 1450 ml  Net -1450 ml   Weight change:   Physical Exam: Gen: Appears to be comfortable resting in bed, speech therapy at bedside CVS: Pulse regular tachycardia, S1 and S2 normal Resp: Clear to auscultation, no  rales/rhonchi Abd: Soft, obese, nontender Ext: No lower extremity edema  Imaging: [name] MV Contrast  Result Date: 04/05/2019 CLINICAL DATA:  Acute presentation with right-sided weakness and speech disturbance. EXAM: MRI HEAD WITHOUT CONTRAST MRA HEAD WITHOUT CONTRAST MRA NECK WITHOUT CONTRAST TECHNIQUE: Multiplanar, multiecho pulse sequences of the brain and surrounding structures were obtained without intravenous contrast. Angiographic images of the Circle of Willis were obtained using MRA technique without intravenous contrast. Angiographic images of the neck were obtained using MRA technique without intravenous contrast. Carotid stenosis measurements (when applicable) are obtained utilizing NASCET criteria, using the distal internal carotid diameter as the denominator. COMPARISON:  Head CT same day.  MRI 09/03/2018. FINDINGS: MRI HEAD FINDINGS Brain: Diffusion imaging confirms acute infarction in the left insula and posterior frontal region. The area of involvement measures about 3.5 cm in size. No mass effect or visible hemorrhage. No hydrocephalus. Chronic small-vessel ischemic changes of the deep white matter seen elsewhere. Vascular: Major vessels at the base of the brain show flow. Skull and upper cervical spine: Negative Sinuses/Orbits: Clear/normal Other: None MRA HEAD FINDINGS Both internal carotid arteries are patent through the skull base and siphon regions with atherosclerotic narrowing and irregularity in the siphon regions. Supraclinoid internal carotid arteries are widely patent. The anterior and middle cerebral vessels appear to show flow without visible large or medium vessel occlusion. Distal branch vessels do show some atherosclerotic irregularity. The left vertebral artery supplies the basilar. No antegrade flow seen in the right vertebral artery. The basilar artery shows atherosclerotic narrowing and irregularity diffusely. Posterior circulation branch vessels are patent, with  both posterior cerebral arteries receiving  most of there supply from the anterior circulation. There is long segment stenosis of the left P1 segment, but good flow seen distal to that. MRA NECK FINDINGS There is antegrade flow in both common carotid arteries. The study suffers from pronounced motion degradation. Antegrade flow is seen in both cervical internal carotid arteries, but accurate evaluation of the carotid bifurcations is not possible. There is antegrade flow in the left vertebral artery but none seen on the right. IMPRESSION: 3.5 cm region of acute infarction affecting the left insula and posterior frontal region. No evidence of gross hemorrhage. MR angiography of the neck markedly degraded by motion. Both internal carotid arteries show antegrade flow. No antegrade flow in the right vertebral artery. Intracranial MR angiography does not show any large or medium vessel occlusion. Widespread atherosclerotic irregularity of the more distal branch vessels. Electronically Signed   By: Paulina Fusi M.D.   On: 04/05/2019 10:59   [name] Neck Wo Contrast  Result Date: 04/05/2019 CLINICAL DATA:  Acute presentation with right-sided weakness and speech disturbance. EXAM: MRI HEAD WITHOUT CONTRAST MRA HEAD WITHOUT CONTRAST MRA NECK WITHOUT CONTRAST TECHNIQUE: Multiplanar, multiecho pulse sequences of the brain and surrounding structures were obtained without intravenous contrast. Angiographic images of the Circle of Willis were obtained using MRA technique without intravenous contrast. Angiographic images of the neck were obtained using MRA technique without intravenous contrast. Carotid stenosis measurements (when applicable) are obtained utilizing NASCET criteria, using the distal internal carotid diameter as the denominator. COMPARISON:  Head CT same day.  MRI 09/03/2018. FINDINGS: MRI HEAD FINDINGS Brain: Diffusion imaging confirms acute infarction in the left insula and posterior frontal region. The area of  involvement measures about 3.5 cm in size. No mass effect or visible hemorrhage. No hydrocephalus. Chronic small-vessel ischemic changes of the deep white matter seen elsewhere. Vascular: Major vessels at the base of the brain show flow. Skull and upper cervical spine: Negative Sinuses/Orbits: Clear/normal Other: None MRA HEAD FINDINGS Both internal carotid arteries are patent through the skull base and siphon regions with atherosclerotic narrowing and irregularity in the siphon regions. Supraclinoid internal carotid arteries are widely patent. The anterior and middle cerebral vessels appear to show flow without visible large or medium vessel occlusion. Distal branch vessels do show some atherosclerotic irregularity. The left vertebral artery supplies the basilar. No antegrade flow seen in the right vertebral artery. The basilar artery shows atherosclerotic narrowing and irregularity diffusely. Posterior circulation branch vessels are patent, with both posterior cerebral arteries receiving most of there supply from the anterior circulation. There is long segment stenosis of the left P1 segment, but good flow seen distal to that. MRA NECK FINDINGS There is antegrade flow in both common carotid arteries. The study suffers from pronounced motion degradation. Antegrade flow is seen in both cervical internal carotid arteries, but accurate evaluation of the carotid bifurcations is not possible. There is antegrade flow in the left vertebral artery but none seen on the right. IMPRESSION: 3.5 cm region of acute infarction affecting the left insula and posterior frontal region. No evidence of gross hemorrhage. MR angiography of the neck markedly degraded by motion. Both internal carotid arteries show antegrade flow. No antegrade flow in the right vertebral artery. Intracranial MR angiography does not show any large or medium vessel occlusion. Widespread atherosclerotic irregularity of the more distal branch vessels.  Electronically Signed   By: Paulina Fusi M.D.   On: 04/05/2019 10:59   Mr Brain Wo Contrast  Result Date: 04/05/2019 CLINICAL DATA:  Acute presentation with right-sided weakness and speech disturbance. EXAM: MRI HEAD WITHOUT CONTRAST MRA HEAD WITHOUT CONTRAST MRA NECK WITHOUT CONTRAST TECHNIQUE: Multiplanar, multiecho pulse sequences of the brain and surrounding structures were obtained without intravenous contrast. Angiographic images of the Circle of Willis were obtained using MRA technique without intravenous contrast. Angiographic images of the neck were obtained using MRA technique without intravenous contrast. Carotid stenosis measurements (when applicable) are obtained utilizing NASCET criteria, using the distal internal carotid diameter as the denominator. COMPARISON:  Head CT same day.  MRI 09/03/2018. FINDINGS: MRI HEAD FINDINGS Brain: Diffusion imaging confirms acute infarction in the left insula and posterior frontal region. The area of involvement measures about 3.5 cm in size. No mass effect or visible hemorrhage. No hydrocephalus. Chronic small-vessel ischemic changes of the deep white matter seen elsewhere. Vascular: Major vessels at the base of the brain show flow. Skull and upper cervical spine: Negative Sinuses/Orbits: Clear/normal Other: None MRA HEAD FINDINGS Both internal carotid arteries are patent through the skull base and siphon regions with atherosclerotic narrowing and irregularity in the siphon regions. Supraclinoid internal carotid arteries are widely patent. The anterior and middle cerebral vessels appear to show flow without visible large or medium vessel occlusion. Distal branch vessels do show some atherosclerotic irregularity. The left vertebral artery supplies the basilar. No antegrade flow seen in the right vertebral artery. The basilar artery shows atherosclerotic narrowing and irregularity diffusely. Posterior circulation branch vessels are patent, with both posterior  cerebral arteries receiving most of there supply from the anterior circulation. There is long segment stenosis of the left P1 segment, but good flow seen distal to that. MRA NECK FINDINGS There is antegrade flow in both common carotid arteries. The study suffers from pronounced motion degradation. Antegrade flow is seen in both cervical internal carotid arteries, but accurate evaluation of the carotid bifurcations is not possible. There is antegrade flow in the left vertebral artery but none seen on the right. IMPRESSION: 3.5 cm region of acute infarction affecting the left insula and posterior frontal region. No evidence of gross hemorrhage. MR angiography of the neck markedly degraded by motion. Both internal carotid arteries show antegrade flow. No antegrade flow in the right vertebral artery. Intracranial MR angiography does not show any large or medium vessel occlusion. Widespread atherosclerotic irregularity of the more distal branch vessels. Electronically Signed   By: Paulina Fusi M.D.   On: 04/05/2019 10:59   US Renal  Result Date: 04/05/2019 CLINICAL DATA:  68 year old male with hypertension EXAM: RENAL / URINARY TRACT ULTRASOUND COMPLETE COMPARISON:  None. FINDINGS: Right Kidney: Length: 11.1 cm x 5.3 cm x 6.1 cm, 189 cc. Hydronephrosis. Flow confirmed in the hilum of the right kidney. Left Kidney: Length: 10.9 cm x 5.6 cm x 5.9 cm, 187 cc. Hydronephrosis. Flow confirmed in the hilum of the left kidney. Anechoic cystic structure on the inferior cortex measures 2.6 cm x 1.7 cm with through transmission and no internal color flow. Additional cystic lesion measures 1.1 cm x 1.1 cm x 1.0 cm, with through transmission and no internal flow. Bladder: Catheter in place within the bladder. IMPRESSION: Bilateral hydronephrosis. If there is concern for distal obstruction, CT may be indicated. Left-sided renal cysts. Electronically Signed   By: Gilmer Mor D.O.   On: 04/05/2019 15:39   Dg Chest Port 1  View  Result Date: 04/05/2019 CLINICAL DATA:  Altered behavior. EXAM: PORTABLE CHEST 1 VIEW COMPARISON:  None. FINDINGS: Lungs are adequately inflated and otherwise clear. Borderline cardiomegaly. Mediastinum  is within normal. Fusion hardware over the lower cervical spine intact. Minimal degenerative change of the spine. IMPRESSION: No acute findings. Electronically Signed   By: Elberta Fortisaniel  Boyle M.D.   On: 04/05/2019 11:32   Ct Renal Stone Study  Result Date: 04/06/2019 CLINICAL DATA:  Hydronephrosis. EXAM: CT ABDOMEN AND PELVIS WITHOUT CONTRAST TECHNIQUE: Multidetector CT imaging of the abdomen and pelvis was performed following the standard protocol without IV contrast. COMPARISON:  Ultrasound of April 05, 2019. FINDINGS: Lower chest: No acute abnormality. Hepatobiliary: No focal liver abnormality is seen. No gallstones, gallbladder wall thickening, or biliary dilatation. Pancreas: Unremarkable. No pancreatic ductal dilatation or surrounding inflammatory changes. Spleen: Normal in size without focal abnormality. Adrenals/Urinary Tract: Adrenal glands appear normal. Moderate bilateral hydroureteronephrosis is noted without evidence of renal or ureteral calculi. Catheter is noted in urinary bladder which appears to be decompressed. However, there appears to be significant diffuse wall thickening of urinary bladder which may be due to lack of distension, but is concerning for cystitis. Clinical correlation is recommended. Stomach/Bowel: Stomach is within normal limits. Appendix appears normal. No evidence of bowel wall thickening, distention, or inflammatory changes. Vascular/Lymphatic: Aortic atherosclerosis. No enlarged abdominal or pelvic lymph nodes. Reproductive: Prostate is unremarkable. Other: No abdominal wall hernia or abnormality. No abdominopelvic ascites. Musculoskeletal: No acute or significant osseous findings. IMPRESSION: Moderate bilateral hydroureteronephrosis is noted without evidence of  obstructing calculus. Diffuse wall thickening of urinary bladder is noted which may be due to lack of distension, but is concerning for cystitis. Clinical correlation is recommended. Catheter is noted within urinary bladder. Aortic Atherosclerosis (ICD10-I70.0). Electronically Signed   By: Lupita RaiderJames  Green Jr M.D.   On: 04/06/2019 09:24   Ct Head Code Stroke Wo Contrast  Result Date: 04/05/2019 CLINICAL DATA:  Code stroke. Right-sided weakness. Speech disturbance. Last seen normal 0730 hours. EXAM: CT HEAD WITHOUT CONTRAST TECHNIQUE: Contiguous axial images were obtained from the base of the skull through the vertex without intravenous contrast. COMPARISON:  09/03/2018 FINDINGS: Brain: No CT evidence of acute infarction. Chronic small-vessel ischemic changes of the cerebral hemispheric white matter. No mass lesion, hemorrhage, hydrocephalus or extra-axial collection. Vascular: No definite hyperdense vessel. One could question slight hyperdensity in the left M1 segment. Skull: Negative Sinuses/Orbits: Clear/normal Other: None ASPECTS ([place] Stroke Program Early CT Score) - Ganglionic level infarction (caudate, lentiform nuclei, internal capsule, insula, M1-M3 cortex): 6 - Supraganglionic infarction (M4-M6 cortex): 3 Total score (0-10 with 10 being normal): 9 IMPRESSION: 1. Possible early gray-white differentiation loss in the insula on the left. Chronic small-vessel ischemic changes. 2. One could question slight hyperdensity in the left M1 segment. This is not definite. 3. ASPECTS is 9 4. These results were communicated to Dr. Amada JupiterKirkpatrick at 10:01 amon 4/15/2020by text page via the [facility] messaging system. Electronically Signed   By: Paulina FusiMark  Shogry M.D.   On: 04/05/2019 10:04    Labs: BMET Recent Labs  Lab 04/05/19 0946 04/05/19 0953 04/05/19 1313 04/06/19 0015  NA 138  --  138 140  K 6.8*  --  5.9* 6.1*  CL 112*  --  113* 110  CO2 10*  --  10* 16*  GLUCOSE 115*  --  131* 112*  BUN 158*  --  158* 154*   CREATININE 10.48* 10.90* 10.14* 9.56*  CALCIUM 9.1  --  9.0 8.7*  PHOS  --   --   --  6.2*   CBC Recent Labs  Lab 04/05/19 0946  WBC 8.8  NEUTROABS 6.9  HGB 10.5*  HCT 36.3*  MCV 97.8  PLT 179    Medications:    . aspirin  300 mg Rectal Daily   Or  . aspirin  325 mg Oral Daily  . heparin  5,000 Units Subcutaneous Q8H  . metoprolol tartrate  2.5 mg Intravenous Q6H  . sodium zirconium cyclosilicate  10 g Oral Q6H   Zetta Bills, MD 04/06/2019, 11:00 AM

## 2019-04-06 NOTE — Progress Notes (Signed)
PROGRESS NOTE  Gregory [name]  WUJ:[id number] DOB: 28-Jun-1951 DOA: 04/05/2019 PCP: System, Pcp Not In  Brief Narrative: Gregory [name] is a 68 y.o. male with a history of stage III CKD, HTN, HLD who presented to the ED with sudden onset right sided weakness and inability to speak that began the morning of arrival. He demonstrated expressive aphasia with multifocal atrial tachycardia. CT head demonstrated acute CVA in the left insula and posterior frontal region. Neurology was consulted. Creatinine 10.8, BUN 158, K 6.8. Appropriate treatment for hyperkalemia provided, nephrology consulted, and subsequent renal U/S demonstrated bilateral hydronephrosis confirmed on CT renal stone study. Urology is consulted. He has denied chest pain though troponin was elevated at 3 and trended upward. Cardiology was consulted though catheterization is contraindicated at this time and medical management is recommended.  Assessment & Plan: Principal Problem:   Stroke (cerebrum) (HCC) Active Problems:   Pre-diabetes   CKD (chronic kidney disease) stage 3, GFR 30-59 ml/min (HCC)   Essential hypertension   Demand ischemia (HCC)   Multifocal atrial tachycardia (HCC)  Acute renal failure on stage III CKD: Likely due to obstruction based on bilateral hydronephrosis. No evidence of rhabdomyolysis.  - Nephrology following. Hopeful for renal recovery since bladder outlet obstruction has been treated with foley. Will watch for postobstructive diuresis. Monitor UOP which has been fair ~1.5L since placement.  - Urology consulted. Also with significant hematuria.  - If no improvement, may need stenting vs. PCN's  Hyperkalemia, metabolic acidosis:  - Continue isotonic bicarbonate, anticipate improvement with relief of obstruction.  - Continue lokelma q6h, monitor BMP q12h.   NSTEMI: No chest pain. Interpretation of troponin elevation complicated by renal failure, MAT, and acute CVA.  - Cardiology consulted. Maximize  medical therapy as pt is not a candidate for LHC at this time.   Acute ischemic CVA, ICA stenosis: With right-sided weakness, expressive aphasia. 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 - Neurology following. Carotid U/S pending. Started ASA. May need DAPT given NSTEMI, but defer.  - SLP, PT, OT eval pending. Advance diet per SLP recommendations.   Hematuria, acute blood loss anemia: Ferritin not low. Hgb down to 10 from previous baseline of 16. - Also with pyuria and diffuse bladder thickening, will add on urine culture and start ceftriaxone for hemorrhagic cystitis. - Continue monitoring hgb, this PM with T&S, and tomorrow AM. Will stop [place] heparin.  HTN:  - On metoprolol IV for now  Multifocal atrial tachycardia:  - Continue metoprolol, increase dose to improve rate control, BP also up.  DVT prophylaxis: SCDs Code Status: Full Family Communication: Ex-wife by phone with whom he lives. Disposition Plan: Uncertain. Guarded prognosis.   Consultants:   Neurology  Nephrology   Urology  Procedures:  Echocardiogram 04/06/2019: 1. The left ventricle has normal systolic function, with an ejection fraction of 55-60%. The cavity size was normal. There is moderately increased left ventricular wall thickness. Left ventricular diastolic Doppler parameters are consistent with  impaired relaxation. No evidence of left ventricular regional wall motion abnormalities.  2. The right ventricle has normal systolic function. The cavity was normal. There is no increase in right ventricular wall thickness.  3. Mild thickening of the mitral valve leaflet. Mild calcification of the mitral valve leaflet. There is moderate mitral annular calcification present.  4. The aortic valve has an indeterminate number of cusps. Moderate thickening of the aortic valve. Moderate calcification of the aortic valve. Mild-moderate stenosis of the aortic valve.  5. The aortic  root is normal in size and structure.  6. No  intracardiac thrombi or masses were visualized.  Antimicrobials:  Ceftriaxone 4/16 >>    Subjective: Aphasic, nonverbal; though does respond appropriately with head nods/shakes. Denies chest pain, dyspnea, previous unusual bleeding or bruising.   Objective: Vitals:   04/05/19 2130 04/05/19 2330 04/06/19 0352 04/06/19 0831  BP: (!) 135/98 (!) 148/89 (!) 145/72 (!) 142/68  Pulse:  (!) 106 (!) 106 (!) 106  Resp: (!) 25 18 20 17   Temp: 98.3 F (36.8 C) 98 F (36.7 C) 98.1 F (36.7 C) 97.8 F (36.6 C)  TempSrc: Oral Oral Oral Oral  SpO2: 100% 96% 97% 98%  Weight:        Intake/Output Summary (Last 24 hours) at 04/06/2019 1121 Last data filed at 04/06/2019 0900 Gross per 24 hour  Intake 0 ml  Output 1450 ml  Net -1450 ml   Filed Weights   04/05/19 0947  Weight: 88.8 kg    Gen: 68 y.o. male in no distress Pulm: Non-labored breathing room air. Clear to auscultation bilaterally.  CV: Regular tachycardia at time of my exam. No murmur, rub, or gallop. No JVD, no significant pedal edema. GI: Abdomen soft, non-tender, non-distended, with normoactive bowel sounds. No organomegaly or masses felt. Ext: Warm, no deformities Skin: No rashes, lesions or ulcers Neuro: Alert, aphasic limiting exam. Follows commands, moves extremities. . No focal neurological deficits. Psych: Judgement and insight appear normal. Mood & affect appropriate.   Data Reviewed: I have personally reviewed following labs and imaging studies  CBC: Recent Labs  Lab 04/05/19 0946  WBC 8.8  NEUTROABS 6.9  HGB 10.5*  HCT 36.3*  MCV 97.8  PLT 179   Basic Metabolic Panel: Recent Labs  Lab 04/05/19 0946 04/05/19 0953 04/05/19 1313 04/06/19 0015  NA 138  --  138 140  K 6.8*  --  5.9* 6.1*  CL 112*  --  113* 110  CO2 10*  --  10* 16*  GLUCOSE 115*  --  131* 112*  BUN 158*  --  158* 154*  CREATININE 10.48* 10.90* 10.14* 9.56*  CALCIUM 9.1  --  9.0 8.7*  PHOS  --   --   --  6.2*   GFR: CrCl cannot be  calculated (Unknown ideal weight.). Liver Function Tests: Recent Labs  Lab 04/05/19 0946 04/06/19 0015  AST 43*  --   ALT 24  --   ALKPHOS 133*  --   BILITOT 0.6  --   PROT 7.8  --   ALBUMIN 3.9 3.3*   No results for input(s): LIPASE, AMYLASE in the last 168 hours. Recent Labs  Lab 04/05/19 1059  AMMONIA 31   Coagulation Profile: Recent Labs  Lab 04/05/19 0946  INR 1.1   Cardiac Enzymes: Recent Labs  Lab 04/05/19 1055 04/05/19 1313 04/05/19 1830 04/06/19 0015 04/06/19 0855  CKTOTAL  --  504*  --   --   --   TROPONINI 3.29*  --  10.69* 13.96* 17.34*   BNP (last 3 results) No results for input(s): PROBNP in the last 8760 hours. HbA1C: Recent Labs    04/06/19 0015  HGBA1C 6.0*   CBG: Recent Labs  Lab 04/05/19 0947 04/05/19 1120  GLUCAP 95 102*   Lipid Profile: Recent Labs    04/06/19 0015  CHOL 160  HDL 19*  LDLCALC 110*  TRIG 154*  CHOLHDL 8.4   Thyroid Function Tests: No results for input(s): TSH, T4TOTAL, FREET4, T3FREE, THYROIDAB in the  last 72 hours. Anemia Panel: Recent Labs    04/05/19 1313  FERRITIN 509*  TIBC 199*  IRON 121   Urine analysis:    Component Value Date/Time   COLORURINE YELLOW 04/05/2019 1830   APPEARANCEUR CLEAR 04/05/2019 1830   LABSPEC 1.011 04/05/2019 1830   PHURINE 5.0 04/05/2019 1830   GLUCOSEU NEGATIVE 04/05/2019 1830   HGBUR MODERATE (A) 04/05/2019 1830   BILIRUBINUR NEGATIVE 04/05/2019 1830   KETONESUR NEGATIVE 04/05/2019 1830   PROTEINUR NEGATIVE 04/05/2019 1830   UROBILINOGEN 0.2 04/23/2010 1049   NITRITE NEGATIVE 04/05/2019 1830   LEUKOCYTESUR SMALL (A) 04/05/2019 1830   No results found for this or any previous visit (from the past 240 hour(s)).    Radiology Studies: [name] UJ Contrast  Result Date: 04/05/2019 CLINICAL DATA:  Acute presentation with right-sided weakness and speech disturbance. EXAM: MRI HEAD WITHOUT CONTRAST MRA HEAD WITHOUT CONTRAST MRA NECK WITHOUT CONTRAST TECHNIQUE:  Multiplanar, multiecho pulse sequences of the brain and surrounding structures were obtained without intravenous contrast. Angiographic images of the Circle of Willis were obtained using MRA technique without intravenous contrast. Angiographic images of the neck were obtained using MRA technique without intravenous contrast. Carotid stenosis measurements (when applicable) are obtained utilizing NASCET criteria, using the distal internal carotid diameter as the denominator. COMPARISON:  Head CT same day.  MRI 09/03/2018. FINDINGS: MRI HEAD FINDINGS Brain: Diffusion imaging confirms acute infarction in the left insula and posterior frontal region. The area of involvement measures about 3.5 cm in size. No mass effect or visible hemorrhage. No hydrocephalus. Chronic small-vessel ischemic changes of the deep white matter seen elsewhere. Vascular: Major vessels at the base of the brain show flow. Skull and upper cervical spine: Negative Sinuses/Orbits: Clear/normal Other: None MRA HEAD FINDINGS Both internal carotid arteries are patent through the skull base and siphon regions with atherosclerotic narrowing and irregularity in the siphon regions. Supraclinoid internal carotid arteries are widely patent. The anterior and middle cerebral vessels appear to show flow without visible large or medium vessel occlusion. Distal branch vessels do show some atherosclerotic irregularity. The left vertebral artery supplies the basilar. No antegrade flow seen in the right vertebral artery. The basilar artery shows atherosclerotic narrowing and irregularity diffusely. Posterior circulation branch vessels are patent, with both posterior cerebral arteries receiving most of there supply from the anterior circulation. There is long segment stenosis of the left P1 segment, but good flow seen distal to that. MRA NECK FINDINGS There is antegrade flow in both common carotid arteries. The study suffers from pronounced motion degradation.  Antegrade flow is seen in both cervical internal carotid arteries, but accurate evaluation of the carotid bifurcations is not possible. There is antegrade flow in the left vertebral artery but none seen on the right. IMPRESSION: 3.5 cm region of acute infarction affecting the left insula and posterior frontal region. No evidence of gross hemorrhage. MR angiography of the neck markedly degraded by motion. Both internal carotid arteries show antegrade flow. No antegrade flow in the right vertebral artery. Intracranial MR angiography does not show any large or medium vessel occlusion. Widespread atherosclerotic irregularity of the more distal branch vessels. Electronically Signed   By: Paulina Fusi M.D.   On: 04/05/2019 10:59   [name] Neck Wo Contrast  Result Date: 04/05/2019 CLINICAL DATA:  Acute presentation with right-sided weakness and speech disturbance. EXAM: MRI HEAD WITHOUT CONTRAST MRA HEAD WITHOUT CONTRAST MRA NECK WITHOUT CONTRAST TECHNIQUE: Multiplanar, multiecho pulse sequences of the brain and surrounding structures were  obtained without intravenous contrast. Angiographic images of the Circle of Willis were obtained using MRA technique without intravenous contrast. Angiographic images of the neck were obtained using MRA technique without intravenous contrast. Carotid stenosis measurements (when applicable) are obtained utilizing NASCET criteria, using the distal internal carotid diameter as the denominator. COMPARISON:  Head CT same day.  MRI 09/03/2018. FINDINGS: MRI HEAD FINDINGS Brain: Diffusion imaging confirms acute infarction in the left insula and posterior frontal region. The area of involvement measures about 3.5 cm in size. No mass effect or visible hemorrhage. No hydrocephalus. Chronic small-vessel ischemic changes of the deep white matter seen elsewhere. Vascular: Major vessels at the base of the brain show flow. Skull and upper cervical spine: Negative Sinuses/Orbits: Clear/normal Other:  None MRA HEAD FINDINGS Both internal carotid arteries are patent through the skull base and siphon regions with atherosclerotic narrowing and irregularity in the siphon regions. Supraclinoid internal carotid arteries are widely patent. The anterior and middle cerebral vessels appear to show flow without visible large or medium vessel occlusion. Distal branch vessels do show some atherosclerotic irregularity. The left vertebral artery supplies the basilar. No antegrade flow seen in the right vertebral artery. The basilar artery shows atherosclerotic narrowing and irregularity diffusely. Posterior circulation branch vessels are patent, with both posterior cerebral arteries receiving most of there supply from the anterior circulation. There is long segment stenosis of the left P1 segment, but good flow seen distal to that. MRA NECK FINDINGS There is antegrade flow in both common carotid arteries. The study suffers from pronounced motion degradation. Antegrade flow is seen in both cervical internal carotid arteries, but accurate evaluation of the carotid bifurcations is not possible. There is antegrade flow in the left vertebral artery but none seen on the right. IMPRESSION: 3.5 cm region of acute infarction affecting the left insula and posterior frontal region. No evidence of gross hemorrhage. MR angiography of the neck markedly degraded by motion. Both internal carotid arteries show antegrade flow. No antegrade flow in the right vertebral artery. Intracranial MR angiography does not show any large or medium vessel occlusion. Widespread atherosclerotic irregularity of the more distal branch vessels. Electronically Signed   By: Paulina FusiMark  Shogry M.D.   On: 04/05/2019 10:59   Mr Brain Wo Contrast  Result Date: 04/05/2019 CLINICAL DATA:  Acute presentation with right-sided weakness and speech disturbance. EXAM: MRI HEAD WITHOUT CONTRAST MRA HEAD WITHOUT CONTRAST MRA NECK WITHOUT CONTRAST TECHNIQUE: Multiplanar, multiecho  pulse sequences of the brain and surrounding structures were obtained without intravenous contrast. Angiographic images of the Circle of Willis were obtained using MRA technique without intravenous contrast. Angiographic images of the neck were obtained using MRA technique without intravenous contrast. Carotid stenosis measurements (when applicable) are obtained utilizing NASCET criteria, using the distal internal carotid diameter as the denominator. COMPARISON:  Head CT same day.  MRI 09/03/2018. FINDINGS: MRI HEAD FINDINGS Brain: Diffusion imaging confirms acute infarction in the left insula and posterior frontal region. The area of involvement measures about 3.5 cm in size. No mass effect or visible hemorrhage. No hydrocephalus. Chronic small-vessel ischemic changes of the deep white matter seen elsewhere. Vascular: Major vessels at the base of the brain show flow. Skull and upper cervical spine: Negative Sinuses/Orbits: Clear/normal Other: None MRA HEAD FINDINGS Both internal carotid arteries are patent through the skull base and siphon regions with atherosclerotic narrowing and irregularity in the siphon regions. Supraclinoid internal carotid arteries are widely patent. The anterior and middle cerebral vessels appear to show flow without visible  large or medium vessel occlusion. Distal branch vessels do show some atherosclerotic irregularity. The left vertebral artery supplies the basilar. No antegrade flow seen in the right vertebral artery. The basilar artery shows atherosclerotic narrowing and irregularity diffusely. Posterior circulation branch vessels are patent, with both posterior cerebral arteries receiving most of there supply from the anterior circulation. There is long segment stenosis of the left P1 segment, but good flow seen distal to that. MRA NECK FINDINGS There is antegrade flow in both common carotid arteries. The study suffers from pronounced motion degradation. Antegrade flow is seen in both  cervical internal carotid arteries, but accurate evaluation of the carotid bifurcations is not possible. There is antegrade flow in the left vertebral artery but none seen on the right. IMPRESSION: 3.5 cm region of acute infarction affecting the left insula and posterior frontal region. No evidence of gross hemorrhage. MR angiography of the neck markedly degraded by motion. Both internal carotid arteries show antegrade flow. No antegrade flow in the right vertebral artery. Intracranial MR angiography does not show any large or medium vessel occlusion. Widespread atherosclerotic irregularity of the more distal branch vessels. Electronically Signed   By: Paulina Fusi M.D.   On: 04/05/2019 10:59   US Renal  Result Date: 04/05/2019 CLINICAL DATA:  68 year old male with hypertension EXAM: RENAL / URINARY TRACT ULTRASOUND COMPLETE COMPARISON:  None. FINDINGS: Right Kidney: Length: 11.1 cm x 5.3 cm x 6.1 cm, 189 cc. Hydronephrosis. Flow confirmed in the hilum of the right kidney. Left Kidney: Length: 10.9 cm x 5.6 cm x 5.9 cm, 187 cc. Hydronephrosis. Flow confirmed in the hilum of the left kidney. Anechoic cystic structure on the inferior cortex measures 2.6 cm x 1.7 cm with through transmission and no internal color flow. Additional cystic lesion measures 1.1 cm x 1.1 cm x 1.0 cm, with through transmission and no internal flow. Bladder: Catheter in place within the bladder. IMPRESSION: Bilateral hydronephrosis. If there is concern for distal obstruction, CT may be indicated. Left-sided renal cysts. Electronically Signed   By: Gilmer Mor D.O.   On: 04/05/2019 15:39   Dg Chest Port 1 View  Result Date: 04/05/2019 CLINICAL DATA:  Altered behavior. EXAM: PORTABLE CHEST 1 VIEW COMPARISON:  None. FINDINGS: Lungs are adequately inflated and otherwise clear. Borderline cardiomegaly. Mediastinum is within normal. Fusion hardware over the lower cervical spine intact. Minimal degenerative change of the spine. IMPRESSION:  No acute findings. Electronically Signed   By: Elberta Fortis M.D.   On: 04/05/2019 11:32   Ct Renal Stone Study  Result Date: 04/06/2019 CLINICAL DATA:  Hydronephrosis. EXAM: CT ABDOMEN AND PELVIS WITHOUT CONTRAST TECHNIQUE: Multidetector CT imaging of the abdomen and pelvis was performed following the standard protocol without IV contrast. COMPARISON:  Ultrasound of April 05, 2019. FINDINGS: Lower chest: No acute abnormality. Hepatobiliary: No focal liver abnormality is seen. No gallstones, gallbladder wall thickening, or biliary dilatation. Pancreas: Unremarkable. No pancreatic ductal dilatation or surrounding inflammatory changes. Spleen: Normal in size without focal abnormality. Adrenals/Urinary Tract: Adrenal glands appear normal. Moderate bilateral hydroureteronephrosis is noted without evidence of renal or ureteral calculi. Catheter is noted in urinary bladder which appears to be decompressed. However, there appears to be significant diffuse wall thickening of urinary bladder which may be due to lack of distension, but is concerning for cystitis. Clinical correlation is recommended. Stomach/Bowel: Stomach is within normal limits. Appendix appears normal. No evidence of bowel wall thickening, distention, or inflammatory changes. Vascular/Lymphatic: Aortic atherosclerosis. No enlarged abdominal or pelvic lymph  nodes. Reproductive: Prostate is unremarkable. Other: No abdominal wall hernia or abnormality. No abdominopelvic ascites. Musculoskeletal: No acute or significant osseous findings. IMPRESSION: Moderate bilateral hydroureteronephrosis is noted without evidence of obstructing calculus. Diffuse wall thickening of urinary bladder is noted which may be due to lack of distension, but is concerning for cystitis. Clinical correlation is recommended. Catheter is noted within urinary bladder. Aortic Atherosclerosis (ICD10-I70.0). Electronically Signed   By: Lupita Raider M.D.   On: 04/06/2019 09:24   Ct  Head Code Stroke Wo Contrast  Result Date: 04/05/2019 CLINICAL DATA:  Code stroke. Right-sided weakness. Speech disturbance. Last seen normal 0730 hours. EXAM: CT HEAD WITHOUT CONTRAST TECHNIQUE: Contiguous axial images were obtained from the base of the skull through the vertex without intravenous contrast. COMPARISON:  09/03/2018 FINDINGS: Brain: No CT evidence of acute infarction. Chronic small-vessel ischemic changes of the cerebral hemispheric white matter. No mass lesion, hemorrhage, hydrocephalus or extra-axial collection. Vascular: No definite hyperdense vessel. One could question slight hyperdensity in the left M1 segment. Skull: Negative Sinuses/Orbits: Clear/normal Other: None ASPECTS ([place] Stroke Program Early CT Score) - Ganglionic level infarction (caudate, lentiform nuclei, internal capsule, insula, M1-M3 cortex): 6 - Supraganglionic infarction (M4-M6 cortex): 3 Total score (0-10 with 10 being normal): 9 IMPRESSION: 1. Possible early gray-white differentiation loss in the insula on the left. Chronic small-vessel ischemic changes. 2. One could question slight hyperdensity in the left M1 segment. This is not definite. 3. ASPECTS is 9 4. These results were communicated to Dr. Amada Jupiter at 10:01 amon 4/15/2020by text page via the [facility] messaging system. Electronically Signed   By: Paulina Fusi M.D.   On: 04/05/2019 10:04    Scheduled Meds:  aspirin  300 mg Rectal Daily   Or   aspirin  325 mg Oral Daily   heparin  5,000 Units Subcutaneous Q8H   metoprolol tartrate  2.5 mg Intravenous Q6H   sodium zirconium cyclosilicate  10 g Oral Q6H   Continuous Infusions:   sodium bicarbonate (isotonic) infusion in sterile water 125 mL/hr at 04/06/19 0552     LOS: 1 day   Time spent: 35 minutes.  Tyrone Nine, MD [facility] [email or web address] Password [facility] 04/06/2019, 11:21 AM

## 2019-04-06 NOTE — Consult Note (Signed)
Urology Consult  Referring physician: Dr. Jarvis Newcomer Reason for referral: bilateral hydronephrosis  Chief Complaint: urinary frequency  History of Present Illness: Gregory [name] is a 68yo with a history of CVA, CKD, and PVD admitted with CVA and acute renal failure. He had a foley placed and drained 1.5L. He underwent renal US which showed bilateral hydronephrosis and subsequently underwent CT stone study which showed bilateral hydronephrosis to the bladder and a thickened bladder wall. He had significant urinary frequency, urgency, and nocturia prior to admission. He was not on medication for BPH previously. He had gross hematuria since foley placement. UA is concerning for infection. No other associated symptoms, no exacerbating/alleviaitng events.   Past Medical History:  Diagnosis Date  . AKI (acute kidney injury) (HCC)   . CKD (chronic kidney disease) stage 2, GFR 60-89 ml/min   . CVA (cerebral vascular accident) (HCC)   . Hyperlipidemia   . Hypertension   . PVD (peripheral vascular disease) (HCC)    Past Surgical History:  Procedure Laterality Date  . AORTIC ARCH ANGIOGRAPHY N/A 09/09/2018   Procedure: AORTIC ARCH ANGIOGRAPHY;  Surgeon: Sherren Kerns, MD;  [facility]: [facility];  Service: Cardiovascular;  Laterality: N/A;  . CAROTID ANGIOGRAPHY Bilateral 09/09/2018   Procedure: CAROTID ANGIOGRAPHY;  Surgeon: Sherren Kerns, MD;  Location: [facility];  Service: Cardiovascular;  Laterality: Bilateral;    Medications: I have reviewed the patient's current medications. Allergies: No Known Allergies  No family history on file. Social History:  has no history on file for tobacco, alcohol, and drug.  Review of Systems  Genitourinary: Positive for frequency, hematuria and urgency.  All other systems reviewed and are negative.   Physical Exam:  Vital signs in last 24 hours: Temp:  [97.8 F (36.6 C)-98.4 F (36.9 C)] 98.3 F (36.8 C) (04/16 1651) Pulse Rate:  [89-120]  89 (04/16 1651) Resp:  [17-25] 18 (04/16 1651) BP: (127-148)/(68-120) 141/78 (04/16 1651) SpO2:  [95 %-100 %] 95 % (04/16 1651) Physical Exam  Constitutional: He is oriented to person, place, and time. He appears well-developed and well-nourished.  HENT:  Head: Normocephalic and atraumatic.  Eyes: Pupils are equal, round, and reactive to light. EOM are normal.  Neck: Normal range of motion. No thyromegaly present.  Cardiovascular: Normal rate and regular rhythm.  Respiratory: Effort normal. No respiratory distress.  GI: Soft. He exhibits no distension. Hernia confirmed negative in the right inguinal area and confirmed negative in the left inguinal area.  Genitourinary:    Testes and penis normal.   Musculoskeletal: Normal range of motion.        General: Edema present.  Lymphadenopathy:       Right: No inguinal adenopathy present.       Left: No inguinal adenopathy present.  Neurological: He is alert and oriented to person, place, and time.  Skin: Skin is warm and dry.  Psychiatric: He has a normal mood and affect.    Laboratory Data:  Results for orders placed or performed during the hospital encounter of 04/05/19 (from the past 72 hour(s))  Protime-INR     Status: None   Collection Time: 04/05/19  9:46 AM  Result Value Ref Range   Prothrombin Time 14.4 11.4 - 15.2 seconds   INR 1.1 0.8 - 1.2    Comment: (NOTE) INR goal varies based on device and disease states. Performed at [facility], [address], [place], [place] [address]   APTT     Status: None  Collection Time: 04/05/19  9:46 AM  Result Value Ref Range   aPTT 27 24 - 36 seconds    Comment: Performed at [facility], [address], [place], [place] [address]  CBC     Status: Abnormal   Collection Time: 04/05/19  9:46 AM  Result Value Ref Range   WBC 8.8 4.0 - 10.5 K/uL   RBC 3.71 (L) 4.22 - 5.81 MIL/uL   Hemoglobin 10.5 (L) 13.0 - 17.0 g/dL   HCT 60.4 (L) 54.0 - 98.1 %   MCV 97.8 80.0 - 100.0  fL   MCH 28.3 26.0 - 34.0 pg   MCHC 28.9 (L) 30.0 - 36.0 g/dL   RDW 19.1 47.8 - 29.5 %   Platelets 179 150 - 400 K/uL   nRBC 0.0 0.0 - 0.2 %    Comment: Performed at [facility], [address], [place], [place] [address]  Differential     Status: None   Collection Time: 04/05/19  9:46 AM  Result Value Ref Range   Neutrophils Relative % 78 %   Neutro Abs 6.9 1.7 - 7.7 K/uL   Lymphocytes Relative 13 %   Lymphs Abs 1.2 0.7 - 4.0 K/uL   Monocytes Relative 7 %   Monocytes Absolute 0.7 0.1 - 1.0 K/uL   Eosinophils Relative 1 %   Eosinophils Absolute 0.1 0.0 - 0.5 K/uL   Basophils Relative 1 %   Basophils Absolute 0.0 0.0 - 0.1 K/uL   Immature Granulocytes 0 %   Abs Immature Granulocytes 0.02 0.00 - 0.07 K/uL    Comment: Performed at [facility], [address] [place], [place] [address]  Comprehensive metabolic panel     Status: Abnormal   Collection Time: 04/05/19  9:46 AM  Result Value Ref Range   Sodium 138 135 - 145 mmol/L   Potassium 6.8 (HH) 3.5 - 5.1 mmol/L    Comment: NO VISIBLE HEMOLYSIS CRITICAL RESULT CALLED TO, READ BACK BY AND VERIFIED WITH: C.ROUGHGARDEN,RN 1036 04/05/2019 CLARK, S    Chloride 112 (H) 98 - 111 mmol/L   CO2 10 (L) 22 - 32 mmol/L   Glucose, Bld 115 (H) 70 - 99 mg/dL   BUN 469 (H) 8 - 23 mg/dL   Creatinine, Ser 62.95 (H) 0.61 - 1.24 mg/dL   Calcium 9.1 8.9 - 28.4 mg/dL   Total Protein 7.8 6.5 - 8.1 g/dL   Albumin 3.9 3.5 - 5.0 g/dL   AST 43 (H) 15 - 41 U/L   ALT 24 0 - 44 U/L   Alkaline Phosphatase 133 (H) 38 - 126 U/L   Total Bilirubin 0.6 0.3 - 1.2 mg/dL   GFR calc non Af Amer 4 (L) >60 mL/min   GFR calc Af Amer 5 (L) >60 mL/min   Anion gap 16 (H) 5 - 15    Comment: Performed at [facility], [address] [place], [place] [address]  CBG monitoring, ED     Status: None   Collection Time: 04/05/19  9:47 AM  Result Value Ref Range   Glucose-Capillary 95 70 - 99 mg/dL  I-stat Creatinine, ED     Status: Abnormal    Collection Time: 04/05/19  9:53 AM  Result Value Ref Range   Creatinine, Ser 10.90 (H) 0.61 - 1.24 mg/dL  Troponin I - ONCE - STAT     Status: Abnormal   Collection Time: 04/05/19 10:55 AM  Result Value Ref Range   Troponin  I 3.29 (HH) <0.03 ng/mL    Comment: CRITICAL RESULT CALLED TO, READ BACK BY AND VERIFIED WITH: RICE,W RN @ 1300 04/05/19 LEONARD,A Performed at [facility], [address] [place], [place] [address]   Ammonia     Status: None   Collection Time: 04/05/19 10:59 AM  Result Value Ref Range   Ammonia 31 9 - 35 umol/L    Comment: Performed at [facility], [address], [place], [place] [address]  POCT i-Stat troponin I     Status: Abnormal   Collection Time: 04/05/19 11:18 AM  Result Value Ref Range   Troponin i, poc 3.99 (HH) 0.00 - 0.08 ng/mL   Comment NOTIFIED PHYSICIAN    Comment 3            Comment: Due to the release kinetics of cTnI, a negative result within the first hours of the onset of symptoms does not rule out myocardial infarction with certainty. If myocardial infarction is still suspected, repeat the test at appropriate intervals.   CBG monitoring, ED     Status: Abnormal   Collection Time: 04/05/19 11:20 AM  Result Value Ref Range   Glucose-Capillary 102 (H) 70 - 99 mg/dL  Basic metabolic panel     Status: Abnormal   Collection Time: 04/05/19  1:13 PM  Result Value Ref Range   Sodium 138 135 - 145 mmol/L   Potassium 5.9 (H) 3.5 - 5.1 mmol/L   Chloride 113 (H) 98 - 111 mmol/L   CO2 10 (L) 22 - 32 mmol/L   Glucose, Bld 131 (H) 70 - 99 mg/dL   BUN 810 (H) 8 - 23 mg/dL   Creatinine, Ser 17.51 (H) 0.61 - 1.24 mg/dL   Calcium 9.0 8.9 - 02.5 mg/dL   GFR calc non Af Amer 5 (L) >60 mL/min   GFR calc Af Amer 5 (L) >60 mL/min   Anion gap 15 5 - 15    Comment: Performed at [facility], [address] [place], [place] [address]  Protein electrophoresis, serum     Status: Abnormal   Collection Time: 04/05/19  1:13 PM  Result  Value Ref Range   Total Protein ELP 7.1 6.0 - 8.5 g/dL   Albumin ELP 3.5 2.9 - 4.4 g/dL   OEUMP-5-TIRWERXV 0.2 0.0 - 0.4 g/dL   [id number]-7-YPPJKDTO 1.1 (H) 0.4 - 1.0 g/dL   Beta Globulin 0.8 0.7 - 1.3 g/dL   Gamma Globulin 1.5 0.4 - 1.8 g/dL   M-Spike, % Not Observed Not Observed g/dL   SPE Interp. Comment     Comment: (NOTE) The SPE pattern demonstrates elevation of regions containing acute phase proteins suggesting an acute/subacute inflammatory response. Some conditions in which this pattern has been observed include: bacterial, viral or parasitic infection; mechanical, physical or chemical trauma; and cardiac failure. The gamma globulin region is unremarkable and evidence of monoclonal protein is not apparent. Performed At: [facility] [address] [place], [place] [id number] Jolene Schimke MD [id number]    Comment Comment     Comment: (NOTE) Protein electrophoresis scan will follow via computer, mail, or courier delivery.    Globulin, Total 3.6 2.2 - 3.9 g/dL   A/G Ratio 1.0 0.7 - 1.7  Kappa/lambda light chains     Status: Abnormal   Collection Time: 04/05/19  1:13 PM  Result Value Ref Range   Kappa free light chain 166.2 (H) 3.3 - 19.4 mg/L   Lamda free light chains 80.9 (  H) 5.7 - 26.3 mg/L   Kappa, lamda light chain ratio 2.05 (H) 0.26 - 1.65    Comment: (NOTE) Performed At: [facility] [address] [place], [place] [id number] Jolene Schimke MD [id number]   ANA, IFA (with reflex)     Status: None   Collection Time: 04/05/19  1:13 PM  Result Value Ref Range   ANA Ab, IFA Negative     Comment: (NOTE)                                     Negative   <1:80                                     Borderline  1:80                                     Positive   >1:80 Performed At: [facility] [address] [place], [place] [id number] Jolene Schimke MD [id number]   Mpo/pr-3 (anca) antibodies     Status: None   Collection Time: 04/05/19   1:13 PM  Result Value Ref Range   Myeloperoxidase Abs <9.0 0.0 - 9.0 U/mL   ANCA Proteinase 3 <3.5 0.0 - 3.5 U/mL    Comment: (NOTE) Performed At: [facility] [address] [place], [place] [id number] Jolene Schimke MD [id number]   Iron and TIBC     Status: Abnormal   Collection Time: 04/05/19  1:13 PM  Result Value Ref Range   Iron 121 45 - 182 ug/dL   TIBC 742 (L) 595 - 638 ug/dL   Saturation Ratios 61 (H) 17.9 - 39.5 %   UIBC 78 ug/dL    Comment: Performed at [facility], [address], [place], [place] [address]  Ferritin     Status: Abnormal   Collection Time: 04/05/19  1:13 PM  Result Value Ref Range   Ferritin 509 (H) 24 - 336 ng/mL    Comment: Performed at [facility], [address], [place], [place] [address]  CK     Status: Abnormal   Collection Time: 04/05/19  1:13 PM  Result Value Ref Range   Total CK 504 (H) 49 - 397 U/L    Comment: Performed at [facility], [address], [place], [place] [address]  Urinalysis, Routine w reflex microscopic     Status: Abnormal   Collection Time: 04/05/19  6:30 PM  Result Value Ref Range   Color, Urine YELLOW YELLOW   APPearance CLEAR CLEAR   Specific Gravity, Urine 1.011 1.005 - 1.030   pH 5.0 5.0 - 8.0   Glucose, UA NEGATIVE NEGATIVE mg/dL   Hgb urine dipstick MODERATE (A) NEGATIVE   Bilirubin Urine NEGATIVE NEGATIVE   Ketones, ur NEGATIVE NEGATIVE mg/dL   Protein, ur NEGATIVE NEGATIVE mg/dL   Nitrite NEGATIVE NEGATIVE   Leukocytes,Ua SMALL (A) NEGATIVE   RBC / HPF 6-10 0 - 5 RBC/hpf   WBC, UA 6-10 0 - 5 WBC/hpf   Bacteria, UA RARE (A) NONE SEEN   Squamous Epithelial / LPF 0-5 0 - 5    Comment: Performed at [facility], [address] [place], [place] [address]  Sodium, urine, random  Status: None   Collection Time: 04/05/19  6:30 PM  Result Value Ref Range   Sodium, Ur 50 mmol/L    Comment: Performed at [facility], [address], [place], [place] [address]   Urea nitrogen, urine     Status: None   Collection Time: 04/05/19  6:30 PM  Result Value Ref Range   Urea Nitrogen, Ur 587 Not Estab. mg/dL    Comment: (NOTE) Performed At: [facility] [address] [place], [place] [id number] Jolene Schimke MD [id number]   Creatinine, urine, random     Status: None   Collection Time: 04/05/19  6:30 PM  Result Value Ref Range   Creatinine, Urine 154.38 mg/dL    Comment: Performed at [facility], [address], [place], [place] [address]  Troponin I - Now Then Q6H     Status: Abnormal   Collection Time: 04/05/19  6:30 PM  Result Value Ref Range   Troponin I 10.69 (HH) <0.03 ng/mL    Comment: CRITICAL VALUE NOTED.  VALUE IS CONSISTENT WITH PREVIOUSLY REPORTED AND CALLED VALUE. Performed at [facility], [address], [place], [place] [address]   Troponin I - Now Then Q6H     Status: Abnormal   Collection Time: 04/06/19 12:15 AM  Result Value Ref Range   Troponin I 13.96 (HH) <0.03 ng/mL    Comment: CRITICAL VALUE NOTED.  VALUE IS CONSISTENT WITH PREVIOUSLY REPORTED AND CALLED VALUE. Performed at [facility], [address] [place], [place] [address]   Hemoglobin A1c     Status: Abnormal   Collection Time: 04/06/19 12:15 AM  Result Value Ref Range   Hgb A1c MFr Bld 6.0 (H) 4.8 - 5.6 %    Comment: (NOTE) Pre diabetes:          5.7%-6.4% Diabetes:              >6.4% Glycemic control for   <7.0% adults with diabetes    Mean Plasma Glucose 125.5 mg/dL    Comment: Performed at [facility], [address] [place], [place] [address]  Lipid panel     Status: Abnormal   Collection Time: 04/06/19 12:15 AM  Result Value Ref Range   Cholesterol 160 0 - 200 mg/dL   Triglycerides 725 (H) <150 mg/dL   HDL 19 (L) >36 mg/dL   Total CHOL/HDL Ratio 8.4 RATIO   VLDL 31 0 - 40 mg/dL   LDL Cholesterol 644 (H) 0 - 99 mg/dL    Comment:        Total Cholesterol/HDL:CHD Risk Coronary Heart Disease Risk Table                      Men   Women  1/2 Average Risk   3.4   3.3  Average Risk       5.0   4.4  2 X Average Risk   9.6   7.1  3 X Average Risk  23.4   11.0        Use the calculated Patient Ratio above and the CHD Risk Table to determine the patient's CHD Risk.        ATP III CLASSIFICATION (LDL):  <100     mg/dL   Optimal  034-742  mg/dL   Near or Above                    Optimal  130-159  mg/dL  Borderline  160-189  mg/dL   High  >960>190     mg/dL   Very High Performed at [facility], [address] [place], [place] [address]   Renal function panel     Status: Abnormal   Collection Time: 04/06/19 12:15 AM  Result Value Ref Range   Sodium 140 135 - 145 mmol/L   Potassium 6.1 (H) 3.5 - 5.1 mmol/L   Chloride 110 98 - 111 mmol/L   CO2 16 (L) 22 - 32 mmol/L   Glucose, Bld 112 (H) 70 - 99 mg/dL   BUN 811154 (H) 8 - 23 mg/dL   Creatinine, Ser 9.149.56 (H) 0.61 - 1.24 mg/dL   Calcium 8.7 (L) 8.9 - 10.3 mg/dL   Phosphorus 6.2 (H) 2.5 - 4.6 mg/dL   Albumin 3.3 (L) 3.5 - 5.0 g/dL   GFR calc non Af Amer 5 (L) >60 mL/min   GFR calc Af Amer 6 (L) >60 mL/min   Anion gap 14 5 - 15    Comment: Performed at [facility], [address] [place], [place] [address]  Basic metabolic panel     Status: Abnormal   Collection Time: 04/06/19  8:18 AM  Result Value Ref Range   Sodium 141 135 - 145 mmol/L   Potassium 5.7 (H) 3.5 - 5.1 mmol/L   Chloride 106 98 - 111 mmol/L   CO2 18 (L) 22 - 32 mmol/L   Glucose, Bld 94 70 - 99 mg/dL   BUN 621147 (H) 8 - 23 mg/dL   Creatinine, Ser 3.088.83 (H) 0.61 - 1.24 mg/dL   Calcium 8.2 (L) 8.9 - 10.3 mg/dL   GFR calc non Af Amer 5 (L) >60 mL/min   GFR calc Af Amer 6 (L) >60 mL/min   Anion gap 17 (H) 5 - 15    Comment: Performed at [facility], [address] [place], [place] [address]  Troponin I - Once     Status: Abnormal   Collection Time: 04/06/19  8:55 AM  Result Value Ref Range   Troponin I 17.34 (HH) <0.03 ng/mL    Comment: CRITICAL VALUE NOTED.   VALUE IS CONSISTENT WITH PREVIOUSLY REPORTED AND CALLED VALUE. Performed at [facility], [address] [place], [place] [address]   Basic metabolic panel     Status: Abnormal   Collection Time: 04/06/19  1:37 PM  Result Value Ref Range   Sodium 143 135 - 145 mmol/L   Potassium 5.4 (H) 3.5 - 5.1 mmol/L   Chloride 105 98 - 111 mmol/L   CO2 20 (L) 22 - 32 mmol/L   Glucose, Bld 97 70 - 99 mg/dL   BUN 528144 (H) 8 - 23 mg/dL   Creatinine, Ser 4.138.54 (H) 0.61 - 1.24 mg/dL   Calcium 8.3 (L) 8.9 - 10.3 mg/dL   GFR calc non Af Amer 6 (L) >60 mL/min   GFR calc Af Amer 7 (L) >60 mL/min   Anion gap 18 (H) 5 - 15    Comment: Performed at [facility], [address] [place], [place] [address]  CBC     Status: Abnormal   Collection Time: 04/06/19  1:37 PM  Result Value Ref Range   WBC 8.3 4.0 - 10.5 K/uL   RBC 2.75 (L) 4.22 - 5.81 MIL/uL   Hemoglobin 7.8 (L) 13.0 - 17.0 g/dL    Comment: REPEATED TO VERIFY   HCT 24.4 (L) 39.0 - 52.0 %  MCV 88.7 80.0 - 100.0 fL    Comment: REPEATED TO VERIFY DELTA CHECK NOTED    MCH 28.4 26.0 - 34.0 pg   MCHC 32.0 30.0 - 36.0 g/dL   RDW 16.1 09.6 - 04.5 %   Platelets 179 150 - 400 K/uL   nRBC 0.0 0.0 - 0.2 %    Comment: Performed at [facility], [address] [place], [place] [address]  Type and screen [facility]     Status: None   Collection Time: 04/06/19  1:44 PM  Result Value Ref Range   ABO/RH(D) A POS    Antibody Screen NEG    Sample Expiration      04/09/2019 Performed at [facility], [address] [place], [place] [address]   ABO/Rh     Status: None   Collection Time: 04/06/19  1:44 PM  Result Value Ref Range   ABO/RH(D)      A POS Performed at [facility], [address], [place], [place] [address]    No results found for this or any previous visit (from the past 240 hour(s)). Creatinine: Recent Labs    04/05/19 0946 04/05/19 0953 04/05/19 1313 04/06/19 0015 04/06/19 0818  04/06/19 1337  CREATININE 10.48* 10.90* 10.14* 9.56* 8.83* 8.54*   Baseline Creatinine: 2  Impression/Assessment:  67yo with BPH with urinary retention, gross hematuria, bilateral hydronephrosis and UTI  Plan:  1. UTI: Please start rocephin pending urine culture 2. Gross hematuria: Likely related to UTI versus foley insertion and currently urine is clear. No further workup needed 3. BPH with LUTS, urinary retention: Please continue foley catheter to straight drain. Due to recent CVA and chronic bladder outlet obstruction the foley should remain in place until the patient can followup in my office in 3-4 weeks.  4. Bilateral hydronephrosis: The hydronephrosis is likely related to chronic outlet obstruction and should improve with foley catheter placement. Please continue to trend creatinine and electrolytes as this patient is at significant risk of electrolyte imbalances from post obstructive diuresis.   Gregory [name] 04/06/2019, 5:42 PM

## 2019-04-06 NOTE — Evaluation (Signed)
Physical Therapy Evaluation Patient Details Name: Gregory [name] MRN: [id number] DOB: [birth year] Today's Date: 04/06/2019   History of Present Illness  Pt is a 68 y/o male admitted secondary to slurred speech and R sided weakness. 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  CT angiogram does show left carotid stenosis.  Review of patient's chart shows that he had cerebral catheter angiogram in September 2019 which showed only 50% extracranial carotid stenosis on either side.  Patient has been found to have hydronephrosis, renal failure as well as NSTEMI. Per cardiology, pt is not a candidate for any invasive evaluation.  He will require medical treatment/supportive care. PMH including but not limited to CKD, HTN and HLD.    Clinical Impression  Pt presented supine in bed with HOB elevated, awake and willing to participate in therapy session. Prior to admission, pt indicated that he was independent with all functional mobility and ADLs. However, pt with great expressive difficulties and no family/caregivers present during evaluation to provide any reliable information. At the time of evaluation, pt required mod A for bed mobility and close min guard to maintain sitting upright at EOB for ~5 minutes prior to fatiguing and lying back down in bed. Pt would continue to benefit from skilled physical therapy services at this time while admitted and after d/c to address the below listed limitations in order to improve overall safety and independence with functional mobility.     Follow Up Recommendations CIR    Equipment Recommendations  None recommended by PT    Recommendations for Other Services Rehab consult     Precautions / Restrictions Precautions Precautions: Fall Restrictions Weight Bearing Restrictions: No      Mobility  Bed Mobility Overal bed mobility: Needs Assistance Bed Mobility: Supine to Sit;Sit to Supine     Supine to sit: Mod assist;HOB elevated Sit to supine: Mod  assist   General bed mobility comments: increased time and effort, assistance needed for R LE movement off of and onto bed as well as for trunk elevation with use of L UE on bed rail  Transfers                 General transfer comment: deferred due to pt fatigue and fluctuating elevated HR in sitting at EOB (110-130 bpm)  Ambulation/Gait                Stairs            Wheelchair Mobility    Modified Rankin (Stroke Patients Only) Modified Rankin (Stroke Patients Only) Pre-Morbid Rankin Score: No symptoms Modified Rankin: Severe disability     Balance Overall balance assessment: Needs assistance Sitting-balance support: Feet supported;Single extremity supported Sitting balance-Leahy Scale: Poor                                       Pertinent Vitals/Pain Pain Assessment: No/denies pain    Home Living Family/patient expects to be discharged to:: Unsure                 Additional Comments: pt with great difficulty speaking but able to nod "yes" and shake head "no" in response to some questions - pt lives with his spouse and was previously independent    Prior Function           Comments: pt with great difficulty speaking but able to nod "yes" and shake head "  no" in response to some questions - pt lives with his spouse and was previously independent     Hand Dominance   Dominant Hand: Right    Extremity/Trunk Assessment   Upper Extremity Assessment Upper Extremity Assessment: Defer to OT evaluation;RUE deficits/detail RUE Deficits / Details: no active movement of R UE throughout    Lower Extremity Assessment Lower Extremity Assessment: RLE deficits/detail RLE Deficits / Details: no active movement of R LE throughout session       Communication   Communication: Expressive difficulties  Cognition Arousal/Alertness: Awake/alert Behavior During Therapy: Flat affect Overall Cognitive Status: Difficult to assess                                         General Comments      Exercises     Assessment/Plan    PT Assessment Patient needs continued PT services  PT Problem List Decreased strength;Decreased activity tolerance;Decreased balance;Decreased mobility;Decreased coordination;Decreased knowledge of use of DME;Decreased safety awareness;Decreased knowledge of precautions;Cardiopulmonary status limiting activity       PT Treatment Interventions DME instruction;Gait training;Stair training;Functional mobility training;Therapeutic activities;Therapeutic exercise;Balance training;Neuromuscular re-education;Patient/family education    PT Goals (Current goals can be found in the Care Plan section)  Acute Rehab PT Goals Patient Stated Goal: unable to state PT Goal Formulation: Patient unable to participate in goal setting Time For Goal Achievement: 04/20/19 Potential to Achieve Goals: Fair    Frequency Min 4X/week   Barriers to discharge        Co-evaluation               AM-PAC PT "6 Clicks" Mobility  Outcome Measure Help needed turning from your back to your side while in a flat bed without using bedrails?: A Lot Help needed moving from lying on your back to sitting on the side of a flat bed without using bedrails?: A Lot Help needed moving to and from a bed to a chair (including a wheelchair)?: Total Help needed standing up from a chair using your arms (e.g., wheelchair or bedside chair)?: Total Help needed to walk in hospital room?: Total Help needed climbing 3-5 steps with a railing? : Total 6 Click Score: 8    End of Session   Activity Tolerance: Patient limited by fatigue Patient left: in bed;with bed alarm set;with call bell/phone within reach Nurse Communication: Mobility status PT Visit Diagnosis: Other abnormalities of gait and mobility (R26.89);Hemiplegia and hemiparesis Hemiplegia - Right/Left: Right Hemiplegia - dominant/non-dominant:  Dominant Hemiplegia - caused by: Cerebral infarction    Time: 1140-1157 PT Time Calculation (min) (ACUTE ONLY): 17 min   Charges:   PT Evaluation $PT Eval Moderate Complexity: 1 Mod          Deborah ChalkJennifer Amos Gaber, PT, DPT  Acute Rehabilitation Services Pager [phone] Office [phone]    Alessandra BevelsJennifer M Brinn Westby 04/06/2019, 3:06 PM

## 2019-04-07 ENCOUNTER — Inpatient Hospital Stay (HOSPITAL_COMMUNITY): Payer: Medicare HMO

## 2019-04-07 ENCOUNTER — Encounter (HOSPITAL_COMMUNITY)
Admission: EM | Disposition: A | Discharge status: Rehabilitation Facility | Payer: Self-pay | Source: Home / Self Care | Attending: Family Medicine

## 2019-04-07 DIAGNOSIS — I63412 Cerebral infarction due to embolism of left middle cerebral artery: Principal | ICD-10-CM

## 2019-04-07 DIAGNOSIS — I6389 Other cerebral infarction: Secondary | ICD-10-CM

## 2019-04-07 HISTORY — PX: LOOP RECORDER INSERTION: EP1214

## 2019-04-07 LAB — CBC
HCT: 30.4 % — ABNORMAL LOW (ref 39.0–52.0)
Hemoglobin: 9.4 g/dL — ABNORMAL LOW (ref 13.0–17.0)
MCH: 27.4 pg (ref 26.0–34.0)
MCHC: 30.9 g/dL (ref 30.0–36.0)
MCV: 88.6 fL (ref 80.0–100.0)
Platelets: 164 10*3/uL (ref 150–400)
RBC: 3.43 MIL/uL — ABNORMAL LOW (ref 4.22–5.81)
RDW: 13.1 % (ref 11.5–15.5)
WBC: 14 10*3/uL — ABNORMAL HIGH (ref 4.0–10.5)
nRBC: 0 % (ref 0.0–0.2)

## 2019-04-07 LAB — GLUCOSE, CAPILLARY
Glucose-Capillary: 101 mg/dL — ABNORMAL HIGH (ref 70–99)
Glucose-Capillary: 110 mg/dL — ABNORMAL HIGH (ref 70–99)
Glucose-Capillary: 98 mg/dL (ref 70–99)
Glucose-Capillary: 98 mg/dL (ref 70–99)

## 2019-04-07 LAB — BASIC METABOLIC PANEL
Anion gap: 15 (ref 5–15)
BUN: 129 mg/dL — ABNORMAL HIGH (ref 8–23)
CO2: 32 mmol/L (ref 22–32)
Calcium: 7.9 mg/dL — ABNORMAL LOW (ref 8.9–10.3)
Chloride: 98 mmol/L (ref 98–111)
Creatinine, Ser: 7.33 mg/dL — ABNORMAL HIGH (ref 0.61–1.24)
GFR calc Af Amer: 8 mL/min — ABNORMAL LOW (ref >60)
GFR calc non Af Amer: 7 mL/min — ABNORMAL LOW (ref >60)
Glucose, Bld: 112 mg/dL — ABNORMAL HIGH (ref 70–99)
Potassium: 5 mmol/L (ref 3.5–5.1)
Sodium: 145 mmol/L (ref 135–145)

## 2019-04-07 SURGERY — LOOP RECORDER INSERTION

## 2019-04-07 MED ORDER — FUROSEMIDE 10 MG/ML IJ SOLN
40.0000 mg | Freq: Once | INTRAMUSCULAR | Status: AC
  Administered 2019-04-07: 10:00:00 40 mg via INTRAVENOUS
  Filled 2019-04-07: qty 4

## 2019-04-07 MED ORDER — LIDOCAINE-EPINEPHRINE 1 %-1:100000 IJ SOLN
INTRAMUSCULAR | Status: AC
  Filled 2019-04-07: qty 1

## 2019-04-07 MED ORDER — NIFEDIPINE ER OSMOTIC RELEASE 30 MG PO TB24
30.0000 mg | ORAL_TABLET | Freq: Every day | ORAL | Status: DC
  Filled 2019-04-07: qty 1

## 2019-04-07 MED ORDER — RESOURCE THICKENUP CLEAR PO POWD
ORAL | Status: DC | PRN
  Filled 2019-04-07: qty 125

## 2019-04-07 MED ORDER — SODIUM BICARBONATE 650 MG PO TABS: 650.0000 mg | ORAL_TABLET | Freq: Three times a day (TID) | ORAL | Status: DC

## 2019-04-07 MED ORDER — LIDOCAINE-EPINEPHRINE 1 %-1:100000 IJ SOLN
INTRAMUSCULAR | Status: DC | PRN
  Administered 2019-04-07: 5 mL

## 2019-04-07 MED ORDER — ACETAMINOPHEN 325 MG PO TABS: 325.0000 mg | ORAL_TABLET | ORAL | Status: DC | PRN

## 2019-04-07 MED ORDER — HYDRALAZINE HCL 20 MG/ML IJ SOLN: 10.0000 mg | Freq: Four times a day (QID) | INTRAMUSCULAR | Status: DC | PRN

## 2019-04-07 MED ORDER — ONDANSETRON HCL 4 MG/2ML IJ SOLN: 4.0000 mg | Freq: Four times a day (QID) | INTRAMUSCULAR | Status: DC | PRN

## 2019-04-07 MED ORDER — CLONIDINE HCL 0.2 MG/24HR TD PTWK
0.2000 mg | MEDICATED_PATCH | TRANSDERMAL | Status: DC
  Filled 2019-04-07: qty 1

## 2019-04-07 SURGICAL SUPPLY — 2 items
LOOP REVEAL LINQSYS (Prosthesis & Implant Heart) ×3 IMPLANT
PACK LOOP INSERTION (CUSTOM PROCEDURE TRAY) ×3 IMPLANT

## 2019-04-07 NOTE — Progress Notes (Signed)
STROKE TEAM PROGRESS NOTE   INTERVAL HISTORY Patient speech pathologist is at the bedside.  She recommends modified barium swallow later today.  Patient continues to have severe expressive aphasia mild right hemiparesis.  Carotid ultrasound shows mild bilateral 40 to 59% carotid stenosis.  Transthoracic echo showed ejection fraction 35 to 60% without obvious cardiac source of embolism.  Vitals:   04/07/19 0402 04/07/19 0709 04/07/19 0740 04/07/19 0826  BP: (!) 170/62 (!) 147/82 (!) 166/82 (!) 202/149  Pulse:  100 86 95  Resp:  16 19 (!) 21  Temp: 98.5 F (36.9 C)   98.1 F (36.7 C)  TempSrc: Oral   Oral  SpO2:  99% 99% 98%  Weight:        CBC:  Recent Labs  Lab 04/05/19 0946  04/06/19 2019 04/07/19 0906  WBC 8.8   < > 10.1 14.0*  NEUTROABS 6.9  --   --   --   HGB 10.5*   < > 9.9* 9.4*  HCT 36.3*   < > 31.6* 30.4*  MCV 97.8   < > 88.3 88.6  PLT 179   < > 182 164   < > = values in this interval not displayed.    Basic Metabolic Panel:  Recent Labs  Lab 04/06/19 0015  04/06/19 2019 04/07/19 0906  NA 140   < > 143 145  K 6.1*   < > 5.5* 5.0  CL 110   < > 103 98  CO2 16*   < > 24 32  GLUCOSE 112*   < > 109* 112*  BUN 154*   < > 138* 129*  CREATININE 9.56*   < > 8.05* 7.33*  CALCIUM 8.7*   < > 8.2* 7.9*  PHOS 6.2*  --   --   --    < > = values in this interval not displayed.   Lipid Panel:     Component Value Date/Time   CHOL 160 04/06/2019 0015   TRIG 154 (H) 04/06/2019 0015   HDL 19 (L) 04/06/2019 0015   CHOLHDL 8.4 04/06/2019 0015   VLDL 31 04/06/2019 0015   LDLCALC 110 (H) 04/06/2019 0015   HgbA1c:  Lab Results  Component Value Date   HGBA1C 6.0 (H) 04/06/2019   Urine Drug Screen: No results found for: LABOPIA, COCAINSCRNUR, LABBENZ, AMPHETMU, THCU, LABBARB  Alcohol Level No results found for: [facility]  IMAGING US Renal  Result Date: 04/05/2019 CLINICAL DATA:  68 year old male with hypertension EXAM: RENAL / URINARY TRACT ULTRASOUND COMPLETE COMPARISON:   None. FINDINGS: Right Kidney: Length: 11.1 cm x 5.3 cm x 6.1 cm, 189 cc. Hydronephrosis. Flow confirmed in the hilum of the right kidney. Left Kidney: Length: 10.9 cm x 5.6 cm x 5.9 cm, 187 cc. Hydronephrosis. Flow confirmed in the hilum of the left kidney. Anechoic cystic structure on the inferior cortex measures 2.6 cm x 1.7 cm with through transmission and no internal color flow. Additional cystic lesion measures 1.1 cm x 1.1 cm x 1.0 cm, with through transmission and no internal flow. Bladder: Catheter in place within the bladder. IMPRESSION: Bilateral hydronephrosis. If there is concern for distal obstruction, CT may be indicated. Left-sided renal cysts. Electronically Signed   By: Gilmer Mor D.O.   On: 04/05/2019 15:39   Ct Renal Stone Study  Result Date: 04/06/2019 CLINICAL DATA:  Hydronephrosis. EXAM: CT ABDOMEN AND PELVIS WITHOUT CONTRAST TECHNIQUE: Multidetector CT imaging of the abdomen and pelvis was performed following the standard protocol without IV  contrast. COMPARISON:  Ultrasound of April 05, 2019. FINDINGS: Lower chest: No acute abnormality. Hepatobiliary: No focal liver abnormality is seen. No gallstones, gallbladder wall thickening, or biliary dilatation. Pancreas: Unremarkable. No pancreatic ductal dilatation or surrounding inflammatory changes. Spleen: Normal in size without focal abnormality. Adrenals/Urinary Tract: Adrenal glands appear normal. Moderate bilateral hydroureteronephrosis is noted without evidence of renal or ureteral calculi. Catheter is noted in urinary bladder which appears to be decompressed. However, there appears to be significant diffuse wall thickening of urinary bladder which may be due to lack of distension, but is concerning for cystitis. Clinical correlation is recommended. Stomach/Bowel: Stomach is within normal limits. Appendix appears normal. No evidence of bowel wall thickening, distention, or inflammatory changes. Vascular/Lymphatic: Aortic  atherosclerosis. No enlarged abdominal or pelvic lymph nodes. Reproductive: Prostate is unremarkable. Other: No abdominal wall hernia or abnormality. No abdominopelvic ascites. Musculoskeletal: No acute or significant osseous findings. IMPRESSION: Moderate bilateral hydroureteronephrosis is noted without evidence of obstructing calculus. Diffuse wall thickening of urinary bladder is noted which may be due to lack of distension, but is concerning for cystitis. Clinical correlation is recommended. Catheter is noted within urinary bladder. Aortic Atherosclerosis (ICD10-I70.0). Electronically Signed   By: Lupita Raider M.D.   On: 04/06/2019 09:24   Vas US Carotid  Result Date: 04/06/2019 Carotid Arterial Duplex Study Indications:  CVA. Risk Factors: Hypertension, hyperlipidemia, prior CVA. Limitations:  Patient positioning, snoring, body habitus Performing Technologist: Blanch Media RVS  Examination Guidelines: A complete evaluation includes B-mode imaging, spectral Doppler, color Doppler, and power Doppler as needed of all accessible portions of each vessel. Bilateral testing is considered an integral part of a complete examination. Limited examinations for reoccurring indications may be performed as noted.  Right Carotid Findings: +----------+--------+--------+--------+-----------+--------+           PSV cm/sEDV cm/sStenosisDescribe   Comments +----------+--------+--------+--------+-----------+--------+ CCA Prox  80      16              homogeneous         +----------+--------+--------+--------+-----------+--------+ CCA Distal164     24              homogeneous         +----------+--------+--------+--------+-----------+--------+ ICA Prox  188     34      1-39%   homogeneous         +----------+--------+--------+--------+-----------+--------+ ICA Distal112     22                                  +----------+--------+--------+--------+-----------+--------+ ECA       170     11                                   +----------+--------+--------+--------+-----------+--------+ +----------+--------+-------+--------+-------------------+           PSV cm/sEDV cmsDescribeArm Pressure (mmHG) +----------+--------+-------+--------+-------------------+ [id number]                                         +----------+--------+-------+--------+-------------------+ +---------+--------+--------+--------------+ VertebralPSV cm/sEDV cm/sNot identified +---------+--------+--------+--------------+  Left Carotid Findings: +----------+--------+--------+--------+-----------+--------+           PSV cm/sEDV cm/sStenosisDescribe   Comments +----------+--------+--------+--------+-----------+--------+ CCA Prox  181     26  homogeneous         +----------+--------+--------+--------+-----------+--------+ CCA Distal118     23              homogeneous         +----------+--------+--------+--------+-----------+--------+ ICA Prox  161     28      1-39%   homogeneous         +----------+--------+--------+--------+-----------+--------+ ICA Distal152     30                                  +----------+--------+--------+--------+-----------+--------+ ECA       170     12                                  +----------+--------+--------+--------+-----------+--------+ +----------+--------+--------+--------+-------------------+ SubclavianPSV cm/sEDV cm/sDescribeArm Pressure (mmHG) +----------+--------+--------+--------+-------------------+           121                                         +----------+--------+--------+--------+-------------------+ +---------+--------+--+--------+--+---------+ VertebralPSV cm/s52EDV cm/s15Antegrade +---------+--------+--+--------+--+---------+    Preliminary    2D Echocardiogram   1. The left ventricle has normal systolic function, with an ejection fraction of 55-60%. The cavity size was normal. There is  moderately increased left ventricular wall thickness. Left ventricular diastolic Doppler parameters are consistent with  impaired relaxation. No evidence of left ventricular regional wall motion abnormalities.  2. The right ventricle has normal systolic function. The cavity was normal. There is no increase in right ventricular wall thickness.  3. Mild thickening of the mitral valve leaflet. Mild calcification of the mitral valve leaflet. There is moderate mitral annular calcification present.  4. The aortic valve has an indeterminate number of cusps. Moderate thickening of the aortic valve. Moderate calcification of the aortic valve. Mild-moderate stenosis of the aortic valve.  5. The aortic root is normal in size and structure.  6. No intracardiac thrombi or masses were visualized.   PHYSICAL EXAM Pleasant elderly Caucasian male currently not in distress. . Afebrile. Head is nontraumatic. Neck is supple without bruit.    Cardiac exam no murmur or gallop. Lungs are clear to auscultation. Distal pulses are well felt. Neurological Exam : Patient is awake alert he has diminished attention, registration and recall.  Speech is nonfluent and hesitant but can answer his name and short sentences only.  He has easy distractibility.  No dysarthria.  Follows simple midline and one-step commands only.  Extraocular movements appear full range.  He blinks to threat bilaterally.  Mild right lower facial asymmetry when he smiles.  Tongue midline.  Motor system exam mild right-sided weakness but effort is variable.  Mild weakness of right grip and intrinsic hand muscles.  Orbits left over right upper extremity.  Moves lower extremities fairly well against gravity but left more than right.  Sensation and reflexes are preserved bilaterally.  Gait not tested. ASSESSMENT/PLAN [name] is a 68 y.o. male with history of CKD stage III, HTN, HLD presenting who had some R sided numbness, then found unresponsive on  the floor the following morning with Cr 10.48 and K 6.8 on arrival. Positive for NSTEMI.  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 Chronic small vessel disease. ? LM1 hyperdensity. ASPECTS 9    MRI  L insular and posterior frontal infarct  MRA  head no LVO, no medium occlusion. Widespread atherosclerosis   MRA  Neck degraded. B ICA flow antegrade. No antegrade flow R VA  2D Echo EF 55-60%. No source of embolus   Angio 08/2018 showed B ICA 50% stenoses   Carotid dopplers 40-59% Bilateral - unchanged from 9/19 angio  Loop recorder to look for AF as possible source of stroke. Dr. Ladona Ridgelaylor to place today  LDL 110  HgbA1c 6.0  Heparin 5000 units sq tid for VTE prophylaxis Diet Order            Diet NPO time specified  Diet effective now              aspirin 81 mg daily and clopidogrel 75 mg daily prior to admission, now on aspirin 300 mg suppository daily. Recommend continuation of DAPT at time of d/c  Therapy recommendations:  CIR  Disposition:  pending   Hypertension  Stable . From stroke standpoint - Permissive hypertension (OK if < 220/120) but gradually normalize in 5-7 days . Long-term BP goal normotensive  Hyperlipidemia  Home meds:  lipitor 80  Resume intensive statin in hospital once able to swallow  LDL 110, goal < 70  Continue statin at discharge  Other Stroke Risk Factors  Obesity, Body mass index is 33.6 kg/m., recommend weight loss, diet and exercise as appropriate   Hx stroke/TIA  08/2018 - Probable left hemisphericTIA: Likely embolic based on previous infarcts. Placed on aspirin 81 and 75 awaiting VVS consult for ICA dz  Coronary artery disease -Acute  NSTEMI this admission w/ elevated troponins (10.69-13.96-17.34) - not a candidate for intervention - medical management. Avoid aggressive anticoagulation   Obstructive sleep apnea, on CPAP at  home  PVD  Other Active Problems  Hx Gout   UTI - abx  Hematuria d/t UTI  BPH w/ LUTS, irnary retention. Foley. D/t bladder outlet obstruction, leave in foley for 3-4 weeks until follow up  Bilateral hydronephrosis, felt d/t chronic bladder outlet obstruction. Should improve w/ foley  Hyperkalemia 6.8->5.9->5.0  AKI on CRF stage III Cr 10.48->10.14->7.33  Anion gap metabolic acidosis  Multifactorial atrial tachycardia  anemia  Hospital day # 2  Patient presented with embolic left MCA branch infarct.  Carotid Dopplers only shows mild extracranial carotid stenosis.  Patient will benefit with long-term insertion.  Discussed risk benefit with patient and he is agreeable.  Discussed with Dr. Ladona Ridgelaylor.and Grunz greater than 50% time during his 25-minute visit was spent on counseling and coordination of care about stroke and answering questions.  Follow-up as an outpatient with stroke clinic in 6 weeks  Delia HeadyPramod Sethi, MD Medical Director [facility] Pager: [phone] 04/07/2019 11:38 AM   To contact Stroke Continuity provider, please refer to [email or web address]. After hours, contact General Neurology

## 2019-04-07 NOTE — Consult Note (Signed)
Physical Medicine and Rehabilitation Consult Reason for Consult: Right side weakness and slurred speech Referring Physician: [facility]   HPI: Gregory [name] is a 68 y.o.right handed male  With history of chronic kidney disease stage III with creatinine baseline 1.6-1.7, hypertension, hyperlipidemia. Per report patient lives with spouse reportedly independent prior to admission. Presented 04/05/2019 with acute onset of right-sided weakness and slurred speech. Cranial CT scan showed possible early gray-white differentiation loss in the insula on the left. 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 No evidence of gross hemorrhage. MRA negative. Patient did not receive TPA. Noted findings of hyperkalemia 6.8 as well as elevated creatinine 10.48 from baseline 1.70, troponin 3.29. EKG with ST changes. Echocardiogram with ejection fraction of 60% normal systolic function. 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 Cardiology services consulted suspect non-STEMI medical management recommended. Renal ultrasound showed bilateral hydronephrosis with nephrology services consulted as well as urology. A Foley catheter tube was placed and and await plan for possible nephrostomy tubes. Currently on aspirin for CVA prophylaxis. Therapy evaluation completed with recommendations of physical medicine rehabilitation consult.   Review of Systems  Unable to perform ROS: Acuity of condition   Past Medical History:  Diagnosis Date   AKI (acute kidney injury) (HCC)    CKD (chronic kidney disease) stage 2, GFR 60-89 ml/min    CVA (cerebral vascular accident) (HCC)    Hyperlipidemia    Hypertension    PVD (peripheral vascular disease) (HCC)    Past Surgical History:  Procedure Laterality Date   AORTIC ARCH ANGIOGRAPHY N/A 09/09/2018   Procedure: AORTIC ARCH  ANGIOGRAPHY;  Surgeon: Sherren Kerns, MD;  Location: [facility];  Service: Cardiovascular;  Laterality: N/A;   CAROTID ANGIOGRAPHY Bilateral 09/09/2018   Procedure: CAROTID ANGIOGRAPHY;  Surgeon: Sherren Kerns, MD;  Location: [facility];  Service: Cardiovascular;  Laterality: Bilateral;   No family history on file. Social History:  has no history on file for tobacco, alcohol, and drug. Allergies: No Known Allergies Medications Prior to Admission  Medication Sig Dispense Refill   allopurinol (ZYLOPRIM) 300 MG tablet Take 300 mg by mouth daily.     aspirin EC 81 MG EC tablet Take 1 tablet (81 mg total) by mouth daily. 30 tablet 1   atorvastatin (LIPITOR) 80 MG tablet Take 1 tablet (80 mg total) by mouth daily at 6 PM. 90 tablet 1   hydrochlorothiazide (HYDRODIURIL) 50 MG tablet Take 50 mg by mouth daily.     lisinopril (PRINIVIL,ZESTRIL) 40 MG tablet Take 40 mg by mouth daily.     clopidogrel (PLAVIX) 75 MG tablet Take 1 tablet (75 mg total) by mouth daily. 90 tablet 1   diclofenac (VOLTAREN) 75 MG EC tablet Take 75 mg by mouth at bedtime as needed for mild pain or moderate pain.      Home: Home Living Family/patient expects to be discharged to:: Unsure Additional Comments: pt with great difficulty speaking but able to nod "yes" and shake head "no" in response to some questions - pt lives with his spouse and was previously independent  Functional History: Prior Function Comments: pt with great difficulty speaking but able to nod "yes" and shake head "no" in response to some questions - pt lives with his spouse and was previously independent Functional Status:  Mobility: Bed Mobility Overal bed mobility: Needs Assistance Bed  Mobility: Supine to Sit, Sit to Supine Supine to sit: Mod assist, HOB elevated Sit to supine: Mod assist General bed mobility comments: increased time and effort, assistance needed for R LE movement off of and onto bed as well as for  trunk elevation with use of L UE on bed rail Transfers General transfer comment: deferred due to pt fatigue and fluctuating elevated HR in sitting at EOB (110-130 bpm)      ADL:    Cognition: Cognition Overall Cognitive Status: Difficult to assess Orientation Level: Oriented to person Cognition Arousal/Alertness: Awake/alert Behavior During Therapy: Flat affect Overall Cognitive Status: Difficult to assess Difficult to assess due to: Impaired communication(aphasic)  Blood pressure (!) 170/62, pulse (!) 114, temperature 98.5 F (36.9 C), temperature source Oral, resp. rate 18, weight 88.8 kg, SpO2 98 %. Physical Exam  Constitutional: No distress.  HENT:  Head: Normocephalic.  Eyes: Pupils are equal, round, and reactive to light.  Neck: Normal range of motion.  Cardiovascular:  tachy  Respiratory: Effort normal.  Genitourinary:    Genitourinary Comments: foley   Neurological:  Patient is alert. Appears easily distracted. Remained nonverbal for exam. He did follow some simple verbal commands. Right hemiparesis with inconsistent effort. Withdraws from pain on right side.  Skin: Skin is warm.  Psychiatric:  flat    Results for orders placed or performed during the hospital encounter of 04/05/19 (from the past 24 hour(s))  Basic metabolic panel     Status: Abnormal   Collection Time: 04/06/19  8:18 AM  Result Value Ref Range   Sodium 141 135 - 145 mmol/L   Potassium 5.7 (H) 3.5 - 5.1 mmol/L   Chloride 106 98 - 111 mmol/L   CO2 18 (L) 22 - 32 mmol/L   Glucose, Bld 94 70 - 99 mg/dL   BUN 409147 (H) 8 - 23 mg/dL   Creatinine, Ser 8.118.83 (H) 0.61 - 1.24 mg/dL   Calcium 8.2 (L) 8.9 - 10.3 mg/dL   GFR calc non Af Amer 5 (L) >60 mL/min   GFR calc Af Amer 6 (L) >60 mL/min   Anion gap 17 (H) 5 - 15  Troponin I - Once     Status: Abnormal   Collection Time: 04/06/19  8:55 AM  Result Value Ref Range   Troponin I 17.34 (HH) <0.03 ng/mL  Basic metabolic panel     Status: Abnormal    Collection Time: 04/06/19  1:37 PM  Result Value Ref Range   Sodium 143 135 - 145 mmol/L   Potassium 5.4 (H) 3.5 - 5.1 mmol/L   Chloride 105 98 - 111 mmol/L   CO2 20 (L) 22 - 32 mmol/L   Glucose, Bld 97 70 - 99 mg/dL   BUN 914144 (H) 8 - 23 mg/dL   Creatinine, Ser 7.828.54 (H) 0.61 - 1.24 mg/dL   Calcium 8.3 (L) 8.9 - 10.3 mg/dL   GFR calc non Af Amer 6 (L) >60 mL/min   GFR calc Af Amer 7 (L) >60 mL/min   Anion gap 18 (H) 5 - 15  CBC     Status: Abnormal   Collection Time: 04/06/19  1:37 PM  Result Value Ref Range   WBC 8.3 4.0 - 10.5 K/uL   RBC 2.75 (L) 4.22 - 5.81 MIL/uL   Hemoglobin 7.8 (L) 13.0 - 17.0 g/dL   HCT 95.624.4 (L) 21.339.0 - 08.652.0 %   MCV 88.7 80.0 - 100.0 fL   MCH 28.4 26.0 - 34.0 pg   MCHC  32.0 30.0 - 36.0 g/dL   RDW 16.1 09.6 - 04.5 %   Platelets 179 150 - 400 K/uL   nRBC 0.0 0.0 - 0.2 %  Type and screen [facility]     Status: None   Collection Time: 04/06/19  1:44 PM  Result Value Ref Range   ABO/RH(D) A POS    Antibody Screen NEG    Sample Expiration      04/09/2019 Performed at [facility], [address], [place], [place] [address]   ABO/Rh     Status: None   Collection Time: 04/06/19  1:44 PM  Result Value Ref Range   ABO/RH(D)      A POS Performed at [facility], [address], [place], [place] [address]   Basic metabolic panel     Status: Abnormal   Collection Time: 04/06/19  8:19 PM  Result Value Ref Range   Sodium 143 135 - 145 mmol/L   Potassium 5.5 (H) 3.5 - 5.1 mmol/L   Chloride 103 98 - 111 mmol/L   CO2 24 22 - 32 mmol/L   Glucose, Bld 109 (H) 70 - 99 mg/dL   BUN 829 (H) 8 - 23 mg/dL   Creatinine, Ser 5.62 (H) 0.61 - 1.24 mg/dL   Calcium 8.2 (L) 8.9 - 10.3 mg/dL   GFR calc non Af Amer 6 (L) >60 mL/min   GFR calc Af Amer 7 (L) >60 mL/min   Anion gap 16 (H) 5 - 15  Troponin I - Once-Timed     Status: Abnormal   Collection Time: 04/06/19  8:19 PM  Result Value Ref Range   Troponin I 9.60 (HH) <0.03 ng/mL  CBC      Status: Abnormal   Collection Time: 04/06/19  8:19 PM  Result Value Ref Range   WBC 10.1 4.0 - 10.5 K/uL   RBC 3.58 (L) 4.22 - 5.81 MIL/uL   Hemoglobin 9.9 (L) 13.0 - 17.0 g/dL   HCT 13.0 (L) 86.5 - 78.4 %   MCV 88.3 80.0 - 100.0 fL   MCH 27.7 26.0 - 34.0 pg   MCHC 31.3 30.0 - 36.0 g/dL   RDW 69.6 29.5 - 28.4 %   Platelets 182 150 - 400 K/uL   nRBC 0.0 0.0 - 0.2 %  Glucose, capillary     Status: None   Collection Time: 04/07/19 12:38 AM  Result Value Ref Range   Glucose-Capillary 98 70 - 99 mg/dL  Glucose, capillary     Status: None   Collection Time: 04/07/19  4:48 AM  Result Value Ref Range   Glucose-Capillary 98 70 - 99 mg/dL   Mr [facility] Wo Contrast  Result Date: 04/05/2019 CLINICAL DATA:  Acute presentation with right-sided weakness and speech disturbance. EXAM: MRI HEAD WITHOUT CONTRAST MRA HEAD WITHOUT CONTRAST MRA NECK WITHOUT CONTRAST TECHNIQUE: Multiplanar, multiecho pulse sequences of the brain and surrounding structures were obtained without intravenous contrast. Angiographic images of the Circle of Willis were obtained using MRA technique without intravenous contrast. Angiographic images of the neck were obtained using MRA technique without intravenous contrast. Carotid stenosis measurements (when applicable) are obtained utilizing NASCET criteria, using the distal internal carotid diameter as the denominator. COMPARISON:  Head CT same day.  MRI 09/03/2018. FINDINGS: MRI HEAD FINDINGS Brain: Diffusion imaging confirms acute infarction in the left insula and posterior frontal region. The area of involvement measures about 3.5 cm in size. No mass effect or visible hemorrhage. No hydrocephalus.  Chronic small-vessel ischemic changes of the deep white matter seen elsewhere. Vascular: Major vessels at the base of the brain show flow. Skull and upper cervical spine: Negative Sinuses/Orbits: Clear/normal Other: None MRA HEAD FINDINGS Both internal carotid arteries are patent through the  skull base and siphon regions with atherosclerotic narrowing and irregularity in the siphon regions. Supraclinoid internal carotid arteries are widely patent. The anterior and middle cerebral vessels appear to show flow without visible large or medium vessel occlusion. Distal branch vessels do show some atherosclerotic irregularity. The left vertebral artery supplies the basilar. No antegrade flow seen in the right vertebral artery. The basilar artery shows atherosclerotic narrowing and irregularity diffusely. Posterior circulation branch vessels are patent, with both posterior cerebral arteries receiving most of there supply from the anterior circulation. There is long segment stenosis of the left P1 segment, but good flow seen distal to that. MRA NECK FINDINGS There is antegrade flow in both common carotid arteries. The study suffers from pronounced motion degradation. Antegrade flow is seen in both cervical internal carotid arteries, but accurate evaluation of the carotid bifurcations is not possible. There is antegrade flow in the left vertebral artery but none seen on the right. IMPRESSION: 3.5 cm region of acute infarction affecting the left insula and posterior frontal region. No evidence of gross hemorrhage. MR angiography of the neck markedly degraded by motion. Both internal carotid arteries show antegrade flow. No antegrade flow in the right vertebral artery. Intracranial MR angiography does not show any large or medium vessel occlusion. Widespread atherosclerotic irregularity of the more distal branch vessels. Electronically Signed   By: Paulina Fusi M.D.   On: 04/05/2019 10:59   [name] Neck Wo Contrast  Result Date: 04/05/2019 CLINICAL DATA:  Acute presentation with right-sided weakness and speech disturbance. EXAM: MRI HEAD WITHOUT CONTRAST MRA HEAD WITHOUT CONTRAST MRA NECK WITHOUT CONTRAST TECHNIQUE: Multiplanar, multiecho pulse sequences of the brain and surrounding structures were obtained  without intravenous contrast. Angiographic images of the Circle of Willis were obtained using MRA technique without intravenous contrast. Angiographic images of the neck were obtained using MRA technique without intravenous contrast. Carotid stenosis measurements (when applicable) are obtained utilizing NASCET criteria, using the distal internal carotid diameter as the denominator. COMPARISON:  Head CT same day.  MRI 09/03/2018. FINDINGS: MRI HEAD FINDINGS Brain: Diffusion imaging confirms acute infarction in the left insula and posterior frontal region. The area of involvement measures about 3.5 cm in size. No mass effect or visible hemorrhage. No hydrocephalus. Chronic small-vessel ischemic changes of the deep white matter seen elsewhere. Vascular: Major vessels at the base of the brain show flow. Skull and upper cervical spine: Negative Sinuses/Orbits: Clear/normal Other: None MRA HEAD FINDINGS Both internal carotid arteries are patent through the skull base and siphon regions with atherosclerotic narrowing and irregularity in the siphon regions. Supraclinoid internal carotid arteries are widely patent. The anterior and middle cerebral vessels appear to show flow without visible large or medium vessel occlusion. Distal branch vessels do show some atherosclerotic irregularity. The left vertebral artery supplies the basilar. No antegrade flow seen in the right vertebral artery. The basilar artery shows atherosclerotic narrowing and irregularity diffusely. Posterior circulation branch vessels are patent, with both posterior cerebral arteries receiving most of there supply from the anterior circulation. There is long segment stenosis of the left P1 segment, but good flow seen distal to that. MRA NECK FINDINGS There is antegrade flow in both common carotid arteries. The study suffers from pronounced motion degradation. Antegrade  flow is seen in both cervical internal carotid arteries, but accurate evaluation of the  carotid bifurcations is not possible. There is antegrade flow in the left vertebral artery but none seen on the right. IMPRESSION: 3.5 cm region of acute infarction affecting the left insula and posterior frontal region. No evidence of gross hemorrhage. MR angiography of the neck markedly degraded by motion. Both internal carotid arteries show antegrade flow. No antegrade flow in the right vertebral artery. Intracranial MR angiography does not show any large or medium vessel occlusion. Widespread atherosclerotic irregularity of the more distal branch vessels. Electronically Signed   By: Paulina Fusi M.D.   On: 04/05/2019 10:59   Mr Brain Wo Contrast  Result Date: 04/05/2019 CLINICAL DATA:  Acute presentation with right-sided weakness and speech disturbance. EXAM: MRI HEAD WITHOUT CONTRAST MRA HEAD WITHOUT CONTRAST MRA NECK WITHOUT CONTRAST TECHNIQUE: Multiplanar, multiecho pulse sequences of the brain and surrounding structures were obtained without intravenous contrast. Angiographic images of the Circle of Willis were obtained using MRA technique without intravenous contrast. Angiographic images of the neck were obtained using MRA technique without intravenous contrast. Carotid stenosis measurements (when applicable) are obtained utilizing NASCET criteria, using the distal internal carotid diameter as the denominator. COMPARISON:  Head CT same day.  MRI 09/03/2018. FINDINGS: MRI HEAD FINDINGS Brain: Diffusion imaging confirms acute infarction in the left insula and posterior frontal region. The area of involvement measures about 3.5 cm in size. No mass effect or visible hemorrhage. No hydrocephalus. Chronic small-vessel ischemic changes of the deep white matter seen elsewhere. Vascular: Major vessels at the base of the brain show flow. Skull and upper cervical spine: Negative Sinuses/Orbits: Clear/normal Other: None MRA HEAD FINDINGS Both internal carotid arteries are patent through the skull base and siphon  regions with atherosclerotic narrowing and irregularity in the siphon regions. Supraclinoid internal carotid arteries are widely patent. The anterior and middle cerebral vessels appear to show flow without visible large or medium vessel occlusion. Distal branch vessels do show some atherosclerotic irregularity. The left vertebral artery supplies the basilar. No antegrade flow seen in the right vertebral artery. The basilar artery shows atherosclerotic narrowing and irregularity diffusely. Posterior circulation branch vessels are patent, with both posterior cerebral arteries receiving most of there supply from the anterior circulation. There is long segment stenosis of the left P1 segment, but good flow seen distal to that. MRA NECK FINDINGS There is antegrade flow in both common carotid arteries. The study suffers from pronounced motion degradation. Antegrade flow is seen in both cervical internal carotid arteries, but accurate evaluation of the carotid bifurcations is not possible. There is antegrade flow in the left vertebral artery but none seen on the right. IMPRESSION: 3.5 cm region of acute infarction affecting the left insula and posterior frontal region. No evidence of gross hemorrhage. MR angiography of the neck markedly degraded by motion. Both internal carotid arteries show antegrade flow. No antegrade flow in the right vertebral artery. Intracranial MR angiography does not show any large or medium vessel occlusion. Widespread atherosclerotic irregularity of the more distal branch vessels. Electronically Signed   By: Paulina Fusi M.D.   On: 04/05/2019 10:59   US Renal  Result Date: 04/05/2019 CLINICAL DATA:  68 year old male with hypertension EXAM: RENAL / URINARY TRACT ULTRASOUND COMPLETE COMPARISON:  None. FINDINGS: Right Kidney: Length: 11.1 cm x 5.3 cm x 6.1 cm, 189 cc. Hydronephrosis. Flow confirmed in the hilum of the right kidney. Left Kidney: Length: 10.9 cm x 5.6 cm x 5.9  cm, 187 cc.  Hydronephrosis. Flow confirmed in the hilum of the left kidney. Anechoic cystic structure on the inferior cortex measures 2.6 cm x 1.7 cm with through transmission and no internal color flow. Additional cystic lesion measures 1.1 cm x 1.1 cm x 1.0 cm, with through transmission and no internal flow. Bladder: Catheter in place within the bladder. IMPRESSION: Bilateral hydronephrosis. If there is concern for distal obstruction, CT may be indicated. Left-sided renal cysts. Electronically Signed   By: Gilmer Mor D.O.   On: 04/05/2019 15:39   Dg Chest Port 1 View  Result Date: 04/05/2019 CLINICAL DATA:  Altered behavior. EXAM: PORTABLE CHEST 1 VIEW COMPARISON:  None. FINDINGS: Lungs are adequately inflated and otherwise clear. Borderline cardiomegaly. Mediastinum is within normal. Fusion hardware over the lower cervical spine intact. Minimal degenerative change of the spine. IMPRESSION: No acute findings. Electronically Signed   By: Elberta Fortis M.D.   On: 04/05/2019 11:32   Ct Renal Stone Study  Result Date: 04/06/2019 CLINICAL DATA:  Hydronephrosis. EXAM: CT ABDOMEN AND PELVIS WITHOUT CONTRAST TECHNIQUE: Multidetector CT imaging of the abdomen and pelvis was performed following the standard protocol without IV contrast. COMPARISON:  Ultrasound of April 05, 2019. FINDINGS: Lower chest: No acute abnormality. Hepatobiliary: No focal liver abnormality is seen. No gallstones, gallbladder wall thickening, or biliary dilatation. Pancreas: Unremarkable. No pancreatic ductal dilatation or surrounding inflammatory changes. Spleen: Normal in size without focal abnormality. Adrenals/Urinary Tract: Adrenal glands appear normal. Moderate bilateral hydroureteronephrosis is noted without evidence of renal or ureteral calculi. Catheter is noted in urinary bladder which appears to be decompressed. However, there appears to be significant diffuse wall thickening of urinary bladder which may be due to lack of distension, but is  concerning for cystitis. Clinical correlation is recommended. Stomach/Bowel: Stomach is within normal limits. Appendix appears normal. No evidence of bowel wall thickening, distention, or inflammatory changes. Vascular/Lymphatic: Aortic atherosclerosis. No enlarged abdominal or pelvic lymph nodes. Reproductive: Prostate is unremarkable. Other: No abdominal wall hernia or abnormality. No abdominopelvic ascites. Musculoskeletal: No acute or significant osseous findings. IMPRESSION: Moderate bilateral hydroureteronephrosis is noted without evidence of obstructing calculus. Diffuse wall thickening of urinary bladder is noted which may be due to lack of distension, but is concerning for cystitis. Clinical correlation is recommended. Catheter is noted within urinary bladder. Aortic Atherosclerosis (ICD10-I70.0). Electronically Signed   By: Lupita Raider M.D.   On: 04/06/2019 09:24   Ct Head Code Stroke Wo Contrast  Result Date: 04/05/2019 CLINICAL DATA:  Code stroke. Right-sided weakness. Speech disturbance. Last seen normal 0730 hours. EXAM: CT HEAD WITHOUT CONTRAST TECHNIQUE: Contiguous axial images were obtained from the base of the skull through the vertex without intravenous contrast. COMPARISON:  09/03/2018 FINDINGS: Brain: No CT evidence of acute infarction. Chronic small-vessel ischemic changes of the cerebral hemispheric white matter. No mass lesion, hemorrhage, hydrocephalus or extra-axial collection. Vascular: No definite hyperdense vessel. One could question slight hyperdensity in the left M1 segment. Skull: Negative Sinuses/Orbits: Clear/normal Other: None ASPECTS (Alberta Stroke Program Early CT Score) - Ganglionic level infarction (caudate, lentiform nuclei, internal capsule, insula, M1-M3 cortex): 6 - Supraganglionic infarction (M4-M6 cortex): 3 Total score (0-10 with 10 being normal): 9 IMPRESSION: 1. Possible early gray-white differentiation loss in the insula on the left. Chronic small-vessel  ischemic changes. 2. One could question slight hyperdensity in the left M1 segment. This is not definite. 3. ASPECTS is 9 4. These results were communicated to Dr. Amada Jupiter at 10:01 amon 4/15/2020by text page via  the [employer] system. Electronically Signed   By: Paulina Fusi M.D.   On: 04/05/2019 10:04   Vas US Carotid  Result Date: 04/06/2019 Carotid Arterial Duplex Study Indications:  CVA. Risk Factors: Hypertension, hyperlipidemia, prior CVA. Limitations:  Patient positioning, snoring, body habitus Performing Technologist: Blanch Media RVS  Examination Guidelines: A complete evaluation includes B-mode imaging, spectral Doppler, color Doppler, and power Doppler as needed of all accessible portions of each vessel. Bilateral testing is considered an integral part of a complete examination. Limited examinations for reoccurring indications may be performed as noted.  Right Carotid Findings: +----------+--------+--------+--------+-----------+--------+             PSV cm/s EDV cm/s Stenosis Describe    Comments  +----------+--------+--------+--------+-----------+--------+  CCA Prox   80       16                homogeneous           +----------+--------+--------+--------+-----------+--------+  CCA Distal 164      24                homogeneous           +----------+--------+--------+--------+-----------+--------+  ICA Prox   188      34       1-39%    homogeneous           +----------+--------+--------+--------+-----------+--------+  ICA Distal 112      22                                      +----------+--------+--------+--------+-----------+--------+  ECA        170      11                                      +----------+--------+--------+--------+-----------+--------+ +----------+--------+-------+--------+-------------------+             PSV cm/s EDV cms Describe Arm Pressure (mmHG)  +----------+--------+-------+--------+-------------------+  Subclavian 77                                              +----------+--------+-------+--------+-------------------+ +---------+--------+--------+--------------+  Vertebral PSV cm/s EDV cm/s Not identified  +---------+--------+--------+--------------+  Left Carotid Findings: +----------+--------+--------+--------+-----------+--------+             PSV cm/s EDV cm/s Stenosis Describe    Comments  +----------+--------+--------+--------+-----------+--------+  CCA Prox   181      26                homogeneous           +----------+--------+--------+--------+-----------+--------+  CCA Distal 118      23                homogeneous           +----------+--------+--------+--------+-----------+--------+  ICA Prox   161      28       1-39%    homogeneous           +----------+--------+--------+--------+-----------+--------+  ICA Distal 152      30                                      +----------+--------+--------+--------+-----------+--------+  ECA        170      12                                      +----------+--------+--------+--------+-----------+--------+ +----------+--------+--------+--------+-------------------+  Subclavian PSV cm/s EDV cm/s Describe Arm Pressure (mmHG)  +----------+--------+--------+--------+-------------------+             121                                             +----------+--------+--------+--------+-------------------+ +---------+--------+--+--------+--+---------+  Vertebral PSV cm/s 52 EDV cm/s 15 Antegrade  +---------+--------+--+--------+--+---------+    Preliminary      Assessment/Plan: Diagnosis: 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 Does the need for close, 24 hr/day medical supervision in concert with the patient's rehab needs make it unreasonable for this patient to be served in a less intensive setting? Yes 2. Co-Morbidities requiring supervision/potential complications: CKD III, HTN, post-stroke sequelae 3. Due to bladder management, bowel management, safety, skin/wound  care, disease management, medication administration, pain management and patient education, does the patient require 24 hr/day rehab nursing? Yes 4. Does the patient require coordinated care of a physician, rehab nurse, PT (1-2 hrs/day, 5 days/week), OT (1-2 hrs/day, 5 days/week) and SLP (1-2 hrs/day, 5 days/week) to address physical and functional deficits in the context of the above medical diagnosis(es)? Yes Addressing deficits in the following areas: balance, endurance, locomotion, strength, transferring, bowel/bladder control, bathing, dressing, feeding, grooming, toileting, cognition, language, swallowing and psychosocial support 5. Can the patient actively participate in an intensive therapy program of at least 3 hrs of therapy per day at least 5 days per week? Yes 6. The potential for patient to make measurable gains while on inpatient rehab is excellent 7. Anticipated functional outcomes upon discharge from inpatient rehab are modified independent and supervision  with PT, modified independent and supervision with OT, supervision and min assist with SLP. 8. Estimated rehab length of stay to reach the above functional goals is: 13-20 days 9. Anticipated D/C setting: Home 10. Anticipated post D/C treatments: HH therapy 11. Overall Rehab/Functional Prognosis: good  RECOMMENDATIONS: This patient's condition is appropriate for continued rehabilitative care in the following setting: CIR Patient has agreed to participate in recommended program. N/A Note that insurance prior authorization may be required for reimbursement for recommended care.  Comment: Rehab Admissions Coordinator to follow up.  Thanks,  Ranelle Oyster, MD, [place] [facility]  I have personally performed a face to face diagnostic evaluation of this patient. Additionally, I have examined pertinent labs and radiographic images. I have reviewed and concur with the physician assistant's documentation above.    Mcarthur Rossetti Angiulli,  PA-C 04/07/2019

## 2019-04-07 NOTE — Interval H&P Note (Signed)
History and Physical Interval Note:  04/07/2019 1:11 PM  Gregory [name]  has presented today for surgery, with the diagnosis of stroke.  The various methods of treatment have been discussed with the patient and family. After consideration of risks, benefits and other options for treatment, the patient has consented to  Procedure(s): LOOP RECORDER INSERTION (N/A) as a surgical intervention.  The patient's history has been reviewed, patient examined, no change in status, stable for surgery.  I have reviewed the patient's chart and labs.  Questions were answered to the patient's satisfaction.     Lewayne Bunting

## 2019-04-07 NOTE — Progress Notes (Addendum)
Patient ID: Gregory [name], male   DOB: 1951/02/14, 68 y.o.   MRN: [id number] [facility] Progress Note   Update: I was called and informed that the patient is n.p.o. including medications until cleared again by speech therapy because he has failed previous swallow evaluations. Plan: 1.  Discontinue oral sodium bicarbonate supplement 2.  Begin transdermal clonidine patch, as needed intravenous hydralazine  Assessment/ Plan:   1.  Acute kidney injury on chronic kidney disease stage III:  Available data pointing to obstructive uropathy-seen by urology and felt to have definitive management with his Foley catheter with plans to monitor him as an outpatient.  Creatinine improving slowly which suggests that this obstruction was probably present for >2 to 3 weeks and we may see some residual renal injury.  Discontinue fluids and begin diuretic/antihypertensive therapy. 2.  Hyperkalemia: Secondary to acute kidney injury/obstruction.  Improving with better urine output/renal function and will hopefully improve further with diuresis. 3.  Anion gap metabolic acidosis: This is likely secondary to acute kidney injury/distal RTA from obstruction, corrected with isotonic sodium bicarbonate, will start low-dose oral sodium bicarbonate supplement. 4.  Hypertension: Blood pressure elevated, discontinue fluids at this time and start antihypertensive therapy/diuretic. 5.  Anemia: Replete iron stores and stable hemoglobin/hematocrit with resolution of hematuria. 6.  Elevated/rising troponin levels: Medical management at this time in the setting of acute kidney injury 7.  Acute CVA with right-sided weakness/expressive aphasia   Subjective:   Aphasic, denies any pain or shortness of breath   Objective:   BP (!) 202/149 (BP Location: Left Leg)   Pulse 95   Temp 98.1 F (36.7 C) (Oral)   Resp (!) 21   Wt 88.8 kg   SpO2 98%   BMI 33.60 kg/m   Intake/Output Summary (Last 24 hours) at 04/07/2019  0836 Last data filed at 04/07/2019 0516 Gross per 24 hour  Intake 4723.66 ml  Output 2640 ml  Net 2083.66 ml   Weight change:   Physical Exam: Gen: Appears to be comfortable resting in bed, on oxygen via nasal cannula CVS: Pulse regular tachycardia, S1 and S2 normal Resp: Coarse/rhonchorous breath sounds bilaterally Abd: Soft, obese, nontender Ext: No lower extremity edema  Imaging: [name] WU Contrast  Result Date: 04/05/2019 CLINICAL DATA:  Acute presentation with right-sided weakness and speech disturbance. EXAM: MRI HEAD WITHOUT CONTRAST MRA HEAD WITHOUT CONTRAST MRA NECK WITHOUT CONTRAST TECHNIQUE: Multiplanar, multiecho pulse sequences of the brain and surrounding structures were obtained without intravenous contrast. Angiographic images of the Circle of Willis were obtained using MRA technique without intravenous contrast. Angiographic images of the neck were obtained using MRA technique without intravenous contrast. Carotid stenosis measurements (when applicable) are obtained utilizing NASCET criteria, using the distal internal carotid diameter as the denominator. COMPARISON:  Head CT same day.  MRI 09/03/2018. FINDINGS: MRI HEAD FINDINGS Brain: Diffusion imaging confirms acute infarction in the left insula and posterior frontal region. The area of involvement measures about 3.5 cm in size. No mass effect or visible hemorrhage. No hydrocephalus. Chronic small-vessel ischemic changes of the deep white matter seen elsewhere. Vascular: Major vessels at the base of the brain show flow. Skull and upper cervical spine: Negative Sinuses/Orbits: Clear/normal Other: None MRA HEAD FINDINGS Both internal carotid arteries are patent through the skull base and siphon regions with atherosclerotic narrowing and irregularity in the siphon regions. Supraclinoid internal carotid arteries are widely patent. The anterior and middle cerebral vessels appear to show flow without visible large or medium  vessel  occlusion. Distal branch vessels do show some atherosclerotic irregularity. The left vertebral artery supplies the basilar. No antegrade flow seen in the right vertebral artery. The basilar artery shows atherosclerotic narrowing and irregularity diffusely. Posterior circulation branch vessels are patent, with both posterior cerebral arteries receiving most of there supply from the anterior circulation. There is long segment stenosis of the left P1 segment, but good flow seen distal to that. MRA NECK FINDINGS There is antegrade flow in both common carotid arteries. The study suffers from pronounced motion degradation. Antegrade flow is seen in both cervical internal carotid arteries, but accurate evaluation of the carotid bifurcations is not possible. There is antegrade flow in the left vertebral artery but none seen on the right. IMPRESSION: 3.5 cm region of acute infarction affecting the left insula and posterior frontal region. No evidence of gross hemorrhage. MR angiography of the neck markedly degraded by motion. Both internal carotid arteries show antegrade flow. No antegrade flow in the right vertebral artery. Intracranial MR angiography does not show any large or medium vessel occlusion. Widespread atherosclerotic irregularity of the more distal branch vessels. Electronically Signed   By: Paulina Fusi M.D.   On: 04/05/2019 10:59   [name] Neck Wo Contrast  Result Date: 04/05/2019 CLINICAL DATA:  Acute presentation with right-sided weakness and speech disturbance. EXAM: MRI HEAD WITHOUT CONTRAST MRA HEAD WITHOUT CONTRAST MRA NECK WITHOUT CONTRAST TECHNIQUE: Multiplanar, multiecho pulse sequences of the brain and surrounding structures were obtained without intravenous contrast. Angiographic images of the Circle of Willis were obtained using MRA technique without intravenous contrast. Angiographic images of the neck were obtained using MRA technique without intravenous contrast. Carotid stenosis measurements  (when applicable) are obtained utilizing NASCET criteria, using the distal internal carotid diameter as the denominator. COMPARISON:  Head CT same day.  MRI 09/03/2018. FINDINGS: MRI HEAD FINDINGS Brain: Diffusion imaging confirms acute infarction in the left insula and posterior frontal region. The area of involvement measures about 3.5 cm in size. No mass effect or visible hemorrhage. No hydrocephalus. Chronic small-vessel ischemic changes of the deep white matter seen elsewhere. Vascular: Major vessels at the base of the brain show flow. Skull and upper cervical spine: Negative Sinuses/Orbits: Clear/normal Other: None MRA HEAD FINDINGS Both internal carotid arteries are patent through the skull base and siphon regions with atherosclerotic narrowing and irregularity in the siphon regions. Supraclinoid internal carotid arteries are widely patent. The anterior and middle cerebral vessels appear to show flow without visible large or medium vessel occlusion. Distal branch vessels do show some atherosclerotic irregularity. The left vertebral artery supplies the basilar. No antegrade flow seen in the right vertebral artery. The basilar artery shows atherosclerotic narrowing and irregularity diffusely. Posterior circulation branch vessels are patent, with both posterior cerebral arteries receiving most of there supply from the anterior circulation. There is long segment stenosis of the left P1 segment, but good flow seen distal to that. MRA NECK FINDINGS There is antegrade flow in both common carotid arteries. The study suffers from pronounced motion degradation. Antegrade flow is seen in both cervical internal carotid arteries, but accurate evaluation of the carotid bifurcations is not possible. There is antegrade flow in the left vertebral artery but none seen on the right. IMPRESSION: 3.5 cm region of acute infarction affecting the left insula and posterior frontal region. No evidence of gross hemorrhage. MR  angiography of the neck markedly degraded by motion. Both internal carotid arteries show antegrade flow. No antegrade flow in the right vertebral artery.  Intracranial MR angiography does not show any large or medium vessel occlusion. Widespread atherosclerotic irregularity of the more distal branch vessels. Electronically Signed   By: Paulina Fusi M.D.   On: 04/05/2019 10:59   Mr Brain Wo Contrast  Result Date: 04/05/2019 CLINICAL DATA:  Acute presentation with right-sided weakness and speech disturbance. EXAM: MRI HEAD WITHOUT CONTRAST MRA HEAD WITHOUT CONTRAST MRA NECK WITHOUT CONTRAST TECHNIQUE: Multiplanar, multiecho pulse sequences of the brain and surrounding structures were obtained without intravenous contrast. Angiographic images of the Circle of Willis were obtained using MRA technique without intravenous contrast. Angiographic images of the neck were obtained using MRA technique without intravenous contrast. Carotid stenosis measurements (when applicable) are obtained utilizing NASCET criteria, using the distal internal carotid diameter as the denominator. COMPARISON:  Head CT same day.  MRI 09/03/2018. FINDINGS: MRI HEAD FINDINGS Brain: Diffusion imaging confirms acute infarction in the left insula and posterior frontal region. The area of involvement measures about 3.5 cm in size. No mass effect or visible hemorrhage. No hydrocephalus. Chronic small-vessel ischemic changes of the deep white matter seen elsewhere. Vascular: Major vessels at the base of the brain show flow. Skull and upper cervical spine: Negative Sinuses/Orbits: Clear/normal Other: None MRA HEAD FINDINGS Both internal carotid arteries are patent through the skull base and siphon regions with atherosclerotic narrowing and irregularity in the siphon regions. Supraclinoid internal carotid arteries are widely patent. The anterior and middle cerebral vessels appear to show flow without visible large or medium vessel occlusion. Distal  branch vessels do show some atherosclerotic irregularity. The left vertebral artery supplies the basilar. No antegrade flow seen in the right vertebral artery. The basilar artery shows atherosclerotic narrowing and irregularity diffusely. Posterior circulation branch vessels are patent, with both posterior cerebral arteries receiving most of there supply from the anterior circulation. There is long segment stenosis of the left P1 segment, but good flow seen distal to that. MRA NECK FINDINGS There is antegrade flow in both common carotid arteries. The study suffers from pronounced motion degradation. Antegrade flow is seen in both cervical internal carotid arteries, but accurate evaluation of the carotid bifurcations is not possible. There is antegrade flow in the left vertebral artery but none seen on the right. IMPRESSION: 3.5 cm region of acute infarction affecting the left insula and posterior frontal region. No evidence of gross hemorrhage. MR angiography of the neck markedly degraded by motion. Both internal carotid arteries show antegrade flow. No antegrade flow in the right vertebral artery. Intracranial MR angiography does not show any large or medium vessel occlusion. Widespread atherosclerotic irregularity of the more distal branch vessels. Electronically Signed   By: Paulina Fusi M.D.   On: 04/05/2019 10:59   US Renal  Result Date: 04/05/2019 CLINICAL DATA:  68 year old male with hypertension EXAM: RENAL / URINARY TRACT ULTRASOUND COMPLETE COMPARISON:  None. FINDINGS: Right Kidney: Length: 11.1 cm x 5.3 cm x 6.1 cm, 189 cc. Hydronephrosis. Flow confirmed in the hilum of the right kidney. Left Kidney: Length: 10.9 cm x 5.6 cm x 5.9 cm, 187 cc. Hydronephrosis. Flow confirmed in the hilum of the left kidney. Anechoic cystic structure on the inferior cortex measures 2.6 cm x 1.7 cm with through transmission and no internal color flow. Additional cystic lesion measures 1.1 cm x 1.1 cm x 1.0 cm, with  through transmission and no internal flow. Bladder: Catheter in place within the bladder. IMPRESSION: Bilateral hydronephrosis. If there is concern for distal obstruction, CT may be indicated. Left-sided renal cysts. Electronically  Signed   By: Gilmer MorJaime  Wagner D.O.   On: 04/05/2019 15:39   Dg Chest Port 1 View  Result Date: 04/05/2019 CLINICAL DATA:  Altered behavior. EXAM: PORTABLE CHEST 1 VIEW COMPARISON:  None. FINDINGS: Lungs are adequately inflated and otherwise clear. Borderline cardiomegaly. Mediastinum is within normal. Fusion hardware over the lower cervical spine intact. Minimal degenerative change of the spine. IMPRESSION: No acute findings. Electronically Signed   By: Elberta Fortisaniel  Boyle M.D.   On: 04/05/2019 11:32   Ct Renal Stone Study  Result Date: 04/06/2019 CLINICAL DATA:  Hydronephrosis. EXAM: CT ABDOMEN AND PELVIS WITHOUT CONTRAST TECHNIQUE: Multidetector CT imaging of the abdomen and pelvis was performed following the standard protocol without IV contrast. COMPARISON:  Ultrasound of April 05, 2019. FINDINGS: Lower chest: No acute abnormality. Hepatobiliary: No focal liver abnormality is seen. No gallstones, gallbladder wall thickening, or biliary dilatation. Pancreas: Unremarkable. No pancreatic ductal dilatation or surrounding inflammatory changes. Spleen: Normal in size without focal abnormality. Adrenals/Urinary Tract: Adrenal glands appear normal. Moderate bilateral hydroureteronephrosis is noted without evidence of renal or ureteral calculi. Catheter is noted in urinary bladder which appears to be decompressed. However, there appears to be significant diffuse wall thickening of urinary bladder which may be due to lack of distension, but is concerning for cystitis. Clinical correlation is recommended. Stomach/Bowel: Stomach is within normal limits. Appendix appears normal. No evidence of bowel wall thickening, distention, or inflammatory changes. Vascular/Lymphatic: Aortic atherosclerosis. No  enlarged abdominal or pelvic lymph nodes. Reproductive: Prostate is unremarkable. Other: No abdominal wall hernia or abnormality. No abdominopelvic ascites. Musculoskeletal: No acute or significant osseous findings. IMPRESSION: Moderate bilateral hydroureteronephrosis is noted without evidence of obstructing calculus. Diffuse wall thickening of urinary bladder is noted which may be due to lack of distension, but is concerning for cystitis. Clinical correlation is recommended. Catheter is noted within urinary bladder. Aortic Atherosclerosis (ICD10-I70.0). Electronically Signed   By: Lupita RaiderJames  Green Jr M.D.   On: 04/06/2019 09:24   Ct Head Code Stroke Wo Contrast  Result Date: 04/05/2019 CLINICAL DATA:  Code stroke. Right-sided weakness. Speech disturbance. Last seen normal 0730 hours. EXAM: CT HEAD WITHOUT CONTRAST TECHNIQUE: Contiguous axial images were obtained from the base of the skull through the vertex without intravenous contrast. COMPARISON:  09/03/2018 FINDINGS: Brain: No CT evidence of acute infarction. Chronic small-vessel ischemic changes of the cerebral hemispheric white matter. No mass lesion, hemorrhage, hydrocephalus or extra-axial collection. Vascular: No definite hyperdense vessel. One could question slight hyperdensity in the left M1 segment. Skull: Negative Sinuses/Orbits: Clear/normal Other: None ASPECTS ([place] Stroke Program Early CT Score) - Ganglionic level infarction (caudate, lentiform nuclei, internal capsule, insula, M1-M3 cortex): 6 - Supraganglionic infarction (M4-M6 cortex): 3 Total score (0-10 with 10 being normal): 9 IMPRESSION: 1. Possible early gray-white differentiation loss in the insula on the left. Chronic small-vessel ischemic changes. 2. One could question slight hyperdensity in the left M1 segment. This is not definite. 3. ASPECTS is 9 4. These results were communicated to Dr. Amada JupiterKirkpatrick at 10:01 amon 4/15/2020by text page via the [facility] messaging system. Electronically  Signed   By: Paulina FusiMark  Shogry M.D.   On: 04/05/2019 10:04   Vas [place] Carotid  Result Date: 04/06/2019 Carotid Arterial Duplex Study Indications:  CVA. Risk Factors: Hypertension, hyperlipidemia, prior CVA. Limitations:  Patient positioning, snoring, body habitus Performing Technologist: Blanch MediaMegan Riddle RVS  Examination Guidelines: A complete evaluation includes B-mode imaging, spectral Doppler, color Doppler, and power Doppler as needed of all accessible portions of each vessel. Bilateral testing  is considered an integral part of a complete examination. Limited examinations for reoccurring indications may be performed as noted.  Right Carotid Findings: +----------+--------+--------+--------+-----------+--------+           PSV cm/sEDV cm/sStenosisDescribe   Comments +----------+--------+--------+--------+-----------+--------+ CCA Prox  80      16              homogeneous         +----------+--------+--------+--------+-----------+--------+ CCA Distal164     24              homogeneous         +----------+--------+--------+--------+-----------+--------+ ICA Prox  188     34      1-39%   homogeneous         +----------+--------+--------+--------+-----------+--------+ ICA Distal112     22                                  +----------+--------+--------+--------+-----------+--------+ ECA       170     11                                  +----------+--------+--------+--------+-----------+--------+ +----------+--------+-------+--------+-------------------+           PSV cm/sEDV cmsDescribeArm Pressure (mmHG) +----------+--------+-------+--------+-------------------+ [id number]                                         +----------+--------+-------+--------+-------------------+ +---------+--------+--------+--------------+ VertebralPSV cm/sEDV cm/sNot identified +---------+--------+--------+--------------+  Left Carotid Findings:  +----------+--------+--------+--------+-----------+--------+           PSV cm/sEDV cm/sStenosisDescribe   Comments +----------+--------+--------+--------+-----------+--------+ CCA Prox  181     26              homogeneous         +----------+--------+--------+--------+-----------+--------+ CCA Distal118     23              homogeneous         +----------+--------+--------+--------+-----------+--------+ ICA Prox  161     28      1-39%   homogeneous         +----------+--------+--------+--------+-----------+--------+ ICA Distal152     30                                  +----------+--------+--------+--------+-----------+--------+ ECA       170     12                                  +----------+--------+--------+--------+-----------+--------+ +----------+--------+--------+--------+-------------------+ SubclavianPSV cm/sEDV cm/sDescribeArm Pressure (mmHG) +----------+--------+--------+--------+-------------------+           121                                         +----------+--------+--------+--------+-------------------+ +---------+--------+--+--------+--+---------+ VertebralPSV cm/s52EDV cm/s15Antegrade +---------+--------+--+--------+--+---------+    Preliminary     Labs: BMET Recent Labs  Lab 04/05/19 0946 04/05/19 0953 04/05/19 1313 04/06/19 0015 04/06/19 0818 04/06/19 1337 04/06/19 2019  NA 138  --  138 140 141 143 143  K 6.8*  --  5.9* 6.1* 5.7* 5.4* 5.5*  CL  112*  --  113* 110 106 105 103  CO2 10*  --  10* 16* 18* 20* 24  GLUCOSE 115*  --  131* 112* 94 97 109*  BUN 158*  --  158* 154* 147* 144* 138*  CREATININE 10.48* 10.90* 10.14* 9.56* 8.83* 8.54* 8.05*  CALCIUM 9.1  --  9.0 8.7* 8.2* 8.3* 8.2*  PHOS  --   --   --  6.2*  --   --   --    CBC Recent Labs  Lab 04/05/19 0946 04/06/19 1337 04/06/19 2019  WBC 8.8 8.3 10.1  NEUTROABS 6.9  --   --   HGB 10.5* 7.8* 9.9*  HCT 36.3* 24.4* 31.6*  MCV 97.8 88.7 88.3  PLT  179 179 182    Medications:    . aspirin  300 mg Rectal Daily   Or  . aspirin  325 mg Oral Daily  . metoprolol tartrate  5 mg Intravenous Q8H  . sodium zirconium cyclosilicate  10 g Oral Q6H   Zetta Bills, MD 04/07/2019, 8:36 AM

## 2019-04-07 NOTE — Progress Notes (Signed)
Inpatient Rehabilitation-Admissions Coordinator    Met with patient at the bedside to discuss team's recommendation for inpatient rehabilitation. Shared booklets, expectations while in CIR, expected length of stay, and anticipated functional level at DC. Pt indicating he has reservations about CIR, initially shaking his head no when asked if he would be willing to participate in the program. With permission [facility] spoke with his ex-wife, Deneise Lever, and his son, Drequan. Both of them want pt to pursue CIR.   AC will follow up with pt Monday to determine further interest in CIR program.   Jhonnie Garner, OTR/L  Rehab Admissions Coordinator  [phone] 04/07/2019 3:54 PM

## 2019-04-07 NOTE — H&P (View-Only) (Signed)
°Cardiology Consultation:  ° °Patient ID: Gregory [name] °MRN: [id number]; DOB: 04/15/1951 ° °Admit date: 04/05/2019 °Date of Consult: 04/07/2019 ° °Primary Care Provider: System, Pcp Not In °Primary Cardiologist: No primary care provider on file. new °Primary Electrophysiologist:  None  ° ° °Patient Profile:  ° °Gregory [name] is a 68 y.o. 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 He was noted to have an elevated troponin. He had renal failure acutely.He was found to have   who is being seen today for the evaluation of cryptogenic stroke for ILR insertion  at the request of Dr. Sethi. ° °History of Present Illness:  ° °Gregory [name] presented with aphasia and right sided weakness and renal failure. He had a troponin of 9. His creatinine has come down from 10 to 7. He was felt to have an obstructive uropathy. He has stabilized and we are asked to see regarding ILR insertion. The patient has a dense expressive aphasia along with right HP.  ° °Past Medical History:  °Diagnosis Date  °• AKI (acute kidney injury) (HCC)   °• CKD (chronic kidney disease) stage 2, GFR 60-89 ml/min   °• CVA (cerebral vascular accident) (HCC)   °• Hyperlipidemia   °• Hypertension   °• PVD (peripheral vascular disease) (HCC)   ° ° °Past Surgical History:  °Procedure Laterality Date  °• AORTIC ARCH ANGIOGRAPHY N/A 09/09/2018  ° Procedure: AORTIC ARCH ANGIOGRAPHY;  Surgeon: Fields, Charles E, MD;  Location: [facility];  Service: Cardiovascular;  Laterality: N/A;  °• CAROTID ANGIOGRAPHY Bilateral 09/09/2018  ° Procedure: CAROTID ANGIOGRAPHY;  Surgeon: Fields, Charles E, MD;  Location: [facility];  Service: Cardiovascular;  Laterality: Bilateral;  °  ° °Home Medications:  °Prior to Admission medications   °Medication Sig Start Date End Date Taking? Authorizing Provider  °allopurinol (ZYLOPRIM) 300 MG tablet Take 300 mg by mouth daily.   Yes [provider]  °aspirin EC 81  MG EC tablet Take 1 tablet (81 mg total) by mouth daily. 09/06/18  Yes Harbrecht, Lawrence, MD  °atorvastatin (LIPITOR) 80 MG tablet Take 1 tablet (80 mg total) by mouth daily at 6 PM. 09/05/18  Yes Harbrecht, Lawrence, MD  °hydrochlorothiazide (HYDRODIURIL) 50 MG tablet Take 50 mg by mouth daily.   Yes [provider]  °lisinopril (PRINIVIL,ZESTRIL) 40 MG tablet Take 40 mg by mouth daily.   Yes [provider]  °clopidogrel (PLAVIX) 75 MG tablet Take 1 tablet (75 mg total) by mouth daily. 09/06/18   Harbrecht, Lawrence, MD  °diclofenac (VOLTAREN) 75 MG EC tablet Take 75 mg by mouth at bedtime as needed for mild pain or moderate pain.    [provider]  ° ° °Inpatient Medications: °Scheduled Meds: °• aspirin  300 mg Rectal Daily  ° Or  °• aspirin  325 mg Oral Daily  °• cloNIDine  0.2 mg Transdermal Weekly  °• metoprolol tartrate  5 mg Intravenous Q8H  ° °Continuous Infusions: °• cefTRIAXone (ROCEPHIN)  IV 1 g (04/06/19 1238)  ° °PRN Meds: °acetaminophen **OR** acetaminophen (TYLENOL) oral liquid 160 mg/5 mL **OR** acetaminophen, hydrALAZINE ° °Allergies:   No Known Allergies ° °Social History:   °Social History  ° °Socioeconomic History  °• Marital status: Divorced  °  Spouse name: Not on file  °• Number of children: Not on file  °• Years of education: Not on file  °• Highest education level: Not on file  °Occupational History  °•   Not on file  °Social Needs  °• Financial resource strain: Not on file  °• Food insecurity:  °  Worry: Not on file  °  Inability: Not on file  °• Transportation needs:  °  Medical: Not on file  °  Non-medical: Not on file  °Tobacco Use  °• Smoking status: Not on file  °Substance and Sexual Activity  °• Alcohol use: Not on file  °• Drug use: Not on file  °• Sexual activity: Not on file  °Lifestyle  °• Physical activity:  °  Days per week: Not on file  °  Minutes per session: Not on file  °• Stress: Not on file  °Relationships  °• Social connections:  °  Talks on  phone: Not on file  °  Gets together: Not on file  °  Attends religious service: Not on file  °  Active member of club or organization: Not on file  °  Attends meetings of clubs or organizations: Not on file  °  Relationship status: Not on file  °• Intimate partner violence:  °  Fear of current or ex partner: Not on file  °  Emotionally abused: Not on file  °  Physically abused: Not on file  °  Forced sexual activity: Not on file  °Other Topics Concern  °• Not on file  °Social History Narrative  °• Not on file  °  °Family History:   °No SCD ° °ROS:  °Please see the history of present illness.  °Patient is aphasic °All other ROS reviewed and negative.    ° °Physical Exam/Data:  ° °Vitals:  ° 04/07/19 0402 04/07/19 0709 04/07/19 0740 04/07/19 0826  °BP: (!) 170/62 (!) 147/82 (!) 166/82 (!) 202/149  °Pulse:  100 86 95  °Resp:  16 19 (!) 21  °Temp: 98.5 °F (36.9 °C)   98.1 °F (36.7 °C)  °TempSrc: Oral   Oral  °SpO2:  99% 99% 98%  °Weight:      ° ° °Intake/Output Summary (Last 24 hours) at 04/07/2019 1059 °Last data filed at 04/07/2019 1022 °Gross per 24 hour  °Intake 4723.66 ml  °Output 3190 ml  °Net 1533.66 ml  ° °Last 3 Weights 04/05/2019 09/09/2018 09/04/2018  °Weight (lbs) 195 lb 12.3 oz 220 lb 235 lb 7.2 oz  °Weight (kg) 88.8 kg 99.791 kg 106.8 kg  °   °Body mass index is 33.6 kg/m².  °General:  Well nourished, well developed, in no acute distress °HEENT: normal °Lymph: no adenopathy °Neck: no JVD, did not examine carotids °Endocrine:  No thryomegaly °Vascular: No carotid bruits; FA pulses 2+ bilaterally without bruits  °Cardiac:  normal S1, S2; RRR; no murmur  °Lungs:  clear to auscultation bilaterally, no wheezing, rhonchi or rales  °Abd: soft, nontender, no hepatomegaly  °Ext: no edema °Musculoskeletal:  No deformities, BUE and BLE strength normal and equal °Skin: warm and dry  °Neuro:  Expressive aphasia and dense HP °Psych:  Normal affect  ° °EKG:  The EKG was personally reviewed and demonstrates:  NSR °Telemetry:   Telemetry was personally reviewed and demonstrates:  Nsr/sinus tachycardia ° °Relevant CV Studies: ° ° °Laboratory Data: ° °Chemistry °Recent Labs  °Lab 04/06/19 °1337 04/06/19 °2019 04/07/19 °0906  °NA 143 143 145  °K 5.4* 5.5* 5.0  °CL 105 103 98  °CO2 20* 24 32  °GLUCOSE 97 109* 112*  °BUN 144* 138* 129*  °CREATININE 8.54* 8.05* 7.33*  °CALCIUM 8.3* 8.2* 7.9*  °GFRNONAA 6* 6* 7*  °  GFRAA 7* 7* 8*  °ANIONGAP 18* 16* 15  °  °Recent Labs  °Lab 04/05/19 °0946 04/06/19 °0015  °PROT 7.8  --   °ALBUMIN 3.9 3.3*  °AST 43*  --   °ALT 24  --   °ALKPHOS 133*  --   °BILITOT 0.6  --   ° °Hematology °Recent Labs  °Lab 04/06/19 °1337 04/06/19 °2019 04/07/19 °0906  °WBC 8.3 10.1 14.0*  °RBC 2.75* 3.58* 3.43*  °HGB 7.8* 9.9* 9.4*  °HCT 24.4* 31.6* 30.4*  °MCV 88.7 88.3 88.6  °MCH 28.4 27.7 27.4  °MCHC 32.0 31.3 30.9  °RDW 13.2 13.2 13.1  °PLT 179 182 164  ° °Cardiac Enzymes °Recent Labs  °Lab 04/05/19 °1055 04/05/19 °1830 04/06/19 °0015 04/06/19 °0855 04/06/19 °2019  °TROPONINI 3.29* 10.69* 13.96* 17.34* 9.60*  °  °Recent Labs  °Lab 04/05/19 °1118  °TROPIPOC 3.99*  °  °BNPNo results for input(s): BNP, PROBNP in the last 168 hours.  °DDimer No results for input(s): DDIMER in the last 168 hours. ° °Radiology/Studies:  °Mr Mra Head Wo Contrast ° °Result Date: 04/05/2019 °CLINICAL DATA:  Acute presentation with right-sided weakness and speech disturbance. EXAM: MRI HEAD WITHOUT CONTRAST MRA HEAD WITHOUT CONTRAST MRA NECK WITHOUT CONTRAST TECHNIQUE: Multiplanar, multiecho pulse sequences of the brain and surrounding structures were obtained without intravenous contrast. Angiographic images of the Circle of Willis were obtained using MRA technique without intravenous contrast. Angiographic images of the neck were obtained using MRA technique without intravenous contrast. Carotid stenosis measurements (when applicable) are obtained utilizing NASCET criteria, using the distal internal carotid diameter as the denominator. COMPARISON:  Head  CT same day.  MRI 09/03/2018. FINDINGS: MRI HEAD FINDINGS Brain: Diffusion imaging confirms acute infarction in the left insula and posterior frontal region. The area of involvement measures about 3.5 cm in size. No mass effect or visible hemorrhage. No hydrocephalus. Chronic small-vessel ischemic changes of the deep white matter seen elsewhere. Vascular: Major vessels at the base of the brain show flow. Skull and upper cervical spine: Negative Sinuses/Orbits: Clear/normal Other: None MRA HEAD FINDINGS Both internal carotid arteries are patent through the skull base and siphon regions with atherosclerotic narrowing and irregularity in the siphon regions. Supraclinoid internal carotid arteries are widely patent. The anterior and middle cerebral vessels appear to show flow without visible large or medium vessel occlusion. Distal branch vessels do show some atherosclerotic irregularity. The left vertebral artery supplies the basilar. No antegrade flow seen in the right vertebral artery. The basilar artery shows atherosclerotic narrowing and irregularity diffusely. Posterior circulation branch vessels are patent, with both posterior cerebral arteries receiving most of there supply from the anterior circulation. There is long segment stenosis of the left P1 segment, but good flow seen distal to that. MRA NECK FINDINGS There is antegrade flow in both common carotid arteries. The study suffers from pronounced motion degradation. Antegrade flow is seen in both cervical internal carotid arteries, but accurate evaluation of the carotid bifurcations is not possible. There is antegrade flow in the left vertebral artery but none seen on the right. IMPRESSION: 3.5 cm region of acute infarction affecting the left insula and posterior frontal region. No evidence of gross hemorrhage. MR angiography of the neck markedly degraded by motion. Both internal carotid arteries show antegrade flow. No antegrade flow in the right vertebral  artery. Intracranial MR angiography does not show any large or medium vessel occlusion. Widespread atherosclerotic irregularity of the more distal branch vessels. Electronically Signed   By: Mark  Shogry M.D.     On: 04/05/2019 10:59  ° °Mr Mra Neck Wo Contrast ° °Result Date: 04/05/2019 °CLINICAL DATA:  Acute presentation with right-sided weakness and speech disturbance. EXAM: MRI HEAD WITHOUT CONTRAST MRA HEAD WITHOUT CONTRAST MRA NECK WITHOUT CONTRAST TECHNIQUE: Multiplanar, multiecho pulse sequences of the brain and surrounding structures were obtained without intravenous contrast. Angiographic images of the Circle of Willis were obtained using MRA technique without intravenous contrast. Angiographic images of the neck were obtained using MRA technique without intravenous contrast. Carotid stenosis measurements (when applicable) are obtained utilizing NASCET criteria, using the distal internal carotid diameter as the denominator. COMPARISON:  Head CT same day.  MRI 09/03/2018. FINDINGS: MRI HEAD FINDINGS Brain: Diffusion imaging confirms acute infarction in the left insula and posterior frontal region. The area of involvement measures about 3.5 cm in size. No mass effect or visible hemorrhage. No hydrocephalus. Chronic small-vessel ischemic changes of the deep white matter seen elsewhere. Vascular: Major vessels at the base of the brain show flow. Skull and upper cervical spine: Negative Sinuses/Orbits: Clear/normal Other: None MRA HEAD FINDINGS Both internal carotid arteries are patent through the skull base and siphon regions with atherosclerotic narrowing and irregularity in the siphon regions. Supraclinoid internal carotid arteries are widely patent. The anterior and middle cerebral vessels appear to show flow without visible large or medium vessel occlusion. Distal branch vessels do show some atherosclerotic irregularity. The left vertebral artery supplies the basilar. No antegrade flow seen in the right  vertebral artery. The basilar artery shows atherosclerotic narrowing and irregularity diffusely. Posterior circulation branch vessels are patent, with both posterior cerebral arteries receiving most of there supply from the anterior circulation. There is long segment stenosis of the left P1 segment, but good flow seen distal to that. MRA NECK FINDINGS There is antegrade flow in both common carotid arteries. The study suffers from pronounced motion degradation. Antegrade flow is seen in both cervical internal carotid arteries, but accurate evaluation of the carotid bifurcations is not possible. There is antegrade flow in the left vertebral artery but none seen on the right. IMPRESSION: 3.5 cm region of acute infarction affecting the left insula and posterior frontal region. No evidence of gross hemorrhage. MR angiography of the neck markedly degraded by motion. Both internal carotid arteries show antegrade flow. No antegrade flow in the right vertebral artery. Intracranial MR angiography does not show any large or medium vessel occlusion. Widespread atherosclerotic irregularity of the more distal branch vessels. Electronically Signed   By: Mark  Shogry M.D.   On: 04/05/2019 10:59  ° °Mr Brain Wo Contrast ° °Result Date: 04/05/2019 °CLINICAL DATA:  Acute presentation with right-sided weakness and speech disturbance. EXAM: MRI HEAD WITHOUT CONTRAST MRA HEAD WITHOUT CONTRAST MRA NECK WITHOUT CONTRAST TECHNIQUE: Multiplanar, multiecho pulse sequences of the brain and surrounding structures were obtained without intravenous contrast. Angiographic images of the Circle of Willis were obtained using MRA technique without intravenous contrast. Angiographic images of the neck were obtained using MRA technique without intravenous contrast. Carotid stenosis measurements (when applicable) are obtained utilizing NASCET criteria, using the distal internal carotid diameter as the denominator. COMPARISON:  Head CT same day.  MRI  09/03/2018. FINDINGS: MRI HEAD FINDINGS Brain: Diffusion imaging confirms acute infarction in the left insula and posterior frontal region. The area of involvement measures about 3.5 cm in size. No mass effect or visible hemorrhage. No hydrocephalus. Chronic small-vessel ischemic changes of the deep white matter seen elsewhere. Vascular: Major vessels at the base of the brain show flow. Skull and upper cervical   spine: Negative Sinuses/Orbits: Clear/normal Other: None MRA HEAD FINDINGS Both internal carotid arteries are patent through the skull base and siphon regions with atherosclerotic narrowing and irregularity in the siphon regions. Supraclinoid internal carotid arteries are widely patent. The anterior and middle cerebral vessels appear to show flow without visible large or medium vessel occlusion. Distal branch vessels do show some atherosclerotic irregularity. The left vertebral artery supplies the basilar. No antegrade flow seen in the right vertebral artery. The basilar artery shows atherosclerotic narrowing and irregularity diffusely. Posterior circulation branch vessels are patent, with both posterior cerebral arteries receiving most of there supply from the anterior circulation. There is long segment stenosis of the left P1 segment, but good flow seen distal to that. MRA NECK FINDINGS There is antegrade flow in both common carotid arteries. The study suffers from pronounced motion degradation. Antegrade flow is seen in both cervical internal carotid arteries, but accurate evaluation of the carotid bifurcations is not possible. There is antegrade flow in the left vertebral artery but none seen on the right. IMPRESSION: 3.5 cm region of acute infarction affecting the left insula and posterior frontal region. No evidence of gross hemorrhage. MR angiography of the neck markedly degraded by motion. Both internal carotid arteries show antegrade flow. No antegrade flow in the right vertebral artery. Intracranial  MR angiography does not show any large or medium vessel occlusion. Widespread atherosclerotic irregularity of the more distal branch vessels. Electronically Signed   By: Mark  Shogry M.D.   On: 04/05/2019 10:59  ° °Us Renal ° °Result Date: 04/05/2019 °CLINICAL DATA:  67-year-old male with hypertension EXAM: RENAL / URINARY TRACT ULTRASOUND COMPLETE COMPARISON:  None. FINDINGS: Right Kidney: Length: 11.1 cm x 5.3 cm x 6.1 cm, 189 cc. Hydronephrosis. Flow confirmed in the hilum of the right kidney. Left Kidney: Length: 10.9 cm x 5.6 cm x 5.9 cm, 187 cc. Hydronephrosis. Flow confirmed in the hilum of the left kidney. Anechoic cystic structure on the inferior cortex measures 2.6 cm x 1.7 cm with through transmission and no internal color flow. Additional cystic lesion measures 1.1 cm x 1.1 cm x 1.0 cm, with through transmission and no internal flow. Bladder: Catheter in place within the bladder. IMPRESSION: Bilateral hydronephrosis. If there is concern for distal obstruction, CT may be indicated. Left-sided renal cysts. Electronically Signed   By: Jaime  Wagner D.O.   On: 04/05/2019 15:39  ° °Dg Chest Port 1 View ° °Result Date: 04/05/2019 °CLINICAL DATA:  Altered behavior. EXAM: PORTABLE CHEST 1 VIEW COMPARISON:  None. FINDINGS: Lungs are adequately inflated and otherwise clear. Borderline cardiomegaly. Mediastinum is within normal. Fusion hardware over the lower cervical spine intact. Minimal degenerative change of the spine. IMPRESSION: No acute findings. Electronically Signed   By: Daniel  Boyle M.D.   On: 04/05/2019 11:32  ° °Ct Renal Stone Study ° °Result Date: 04/06/2019 °CLINICAL DATA:  Hydronephrosis. EXAM: CT ABDOMEN AND PELVIS WITHOUT CONTRAST TECHNIQUE: Multidetector CT imaging of the abdomen and pelvis was performed following the standard protocol without IV contrast. COMPARISON:  Ultrasound of April 05, 2019. FINDINGS: Lower chest: No acute abnormality. Hepatobiliary: No focal liver abnormality is seen. No  gallstones, gallbladder wall thickening, or biliary dilatation. Pancreas: Unremarkable. No pancreatic ductal dilatation or surrounding inflammatory changes. Spleen: Normal in size without focal abnormality. Adrenals/Urinary Tract: Adrenal glands appear normal. Moderate bilateral hydroureteronephrosis is noted without evidence of renal or ureteral calculi. Catheter is noted in urinary bladder which appears to be decompressed. However, there appears to be significant diffuse   wall thickening of urinary bladder which may be due to lack of distension, but is concerning for cystitis. Clinical correlation is recommended. Stomach/Bowel: Stomach is within normal limits. Appendix appears normal. No evidence of bowel wall thickening, distention, or inflammatory changes. Vascular/Lymphatic: Aortic atherosclerosis. No enlarged abdominal or pelvic lymph nodes. Reproductive: Prostate is unremarkable. Other: No abdominal wall hernia or abnormality. No abdominopelvic ascites. Musculoskeletal: No acute or significant osseous findings. IMPRESSION: Moderate bilateral hydroureteronephrosis is noted without evidence of obstructing calculus. Diffuse wall thickening of urinary bladder is noted which may be due to lack of distension, but is concerning for cystitis. Clinical correlation is recommended. Catheter is noted within urinary bladder. Aortic Atherosclerosis (ICD10-I70.0). Electronically Signed   By: James  Green Jr M.D.   On: 04/06/2019 09:24  ° °Ct Head Code Stroke Wo Contrast ° °Result Date: 04/05/2019 °CLINICAL DATA:  Code stroke. Right-sided weakness. Speech disturbance. Last seen normal 0730 hours. EXAM: CT HEAD WITHOUT CONTRAST TECHNIQUE: Contiguous axial images were obtained from the base of the skull through the vertex without intravenous contrast. COMPARISON:  09/03/2018 FINDINGS: Brain: No CT evidence of acute infarction. Chronic small-vessel ischemic changes of the cerebral hemispheric white matter. No mass lesion,  hemorrhage, hydrocephalus or extra-axial collection. Vascular: No definite hyperdense vessel. One could question slight hyperdensity in the left M1 segment. Skull: Negative Sinuses/Orbits: Clear/normal Other: None ASPECTS (Alberta Stroke Program Early CT Score) - Ganglionic level infarction (caudate, lentiform nuclei, internal capsule, insula, M1-M3 cortex): 6 - Supraganglionic infarction (M4-M6 cortex): 3 Total score (0-10 with 10 being normal): 9 IMPRESSION: 1. Possible early gray-white differentiation loss in the insula on the left. Chronic small-vessel ischemic changes. 2. One could question slight hyperdensity in the left M1 segment. This is not definite. 3. ASPECTS is 9 4. These results were communicated to Dr. Kirkpatrick at 10:01 amon 4/15/2020by text page via the AMION messaging system. Electronically Signed   By: Mark  Shogry M.D.   On: 04/05/2019 10:04  ° °Vas Us Carotid ° °Result Date: 04/06/2019 °Carotid Arterial Duplex Study Indications:  CVA. Risk Factors: Hypertension, hyperlipidemia, prior CVA. Limitations:  Patient positioning, snoring, body habitus Performing Technologist: Megan Riddle RVS  Examination Guidelines: A complete evaluation includes B-mode imaging, spectral Doppler, color Doppler, and power Doppler as needed of all accessible portions of each vessel. Bilateral testing is considered an integral part of a complete examination. Limited examinations for reoccurring indications may be performed as noted.  Right Carotid Findings: +----------+--------+--------+--------+-----------+--------+             PSV cm/s EDV cm/s Stenosis Describe    Comments  +----------+--------+--------+--------+-----------+--------+  CCA Prox   80       16                homogeneous           +----------+--------+--------+--------+-----------+--------+  CCA Distal 164      24                homogeneous           +----------+--------+--------+--------+-----------+--------+  ICA Prox   188      34       1-39%     homogeneous           +----------+--------+--------+--------+-----------+--------+  ICA Distal 112      22                                      +----------+--------+--------+--------+-----------+--------+    ECA        170      11                                      +----------+--------+--------+--------+-----------+--------+ +----------+--------+-------+--------+-------------------+             PSV cm/s EDV cms Describe Arm Pressure (mmHG)  +----------+--------+-------+--------+-------------------+  Subclavian 77                                             +----------+--------+-------+--------+-------------------+ +---------+--------+--------+--------------+  Vertebral PSV cm/s EDV cm/s Not identified  +---------+--------+--------+--------------+  Left Carotid Findings: +----------+--------+--------+--------+-----------+--------+             PSV cm/s EDV cm/s Stenosis Describe    Comments  +----------+--------+--------+--------+-----------+--------+  CCA Prox   181      26                homogeneous           +----------+--------+--------+--------+-----------+--------+  CCA Distal 118      23                homogeneous           +----------+--------+--------+--------+-----------+--------+  ICA Prox   161      28       1-39%    homogeneous           +----------+--------+--------+--------+-----------+--------+  ICA Distal 152      30                                      +----------+--------+--------+--------+-----------+--------+  ECA        170      12                                      +----------+--------+--------+--------+-----------+--------+ +----------+--------+--------+--------+-------------------+  Subclavian PSV cm/s EDV cm/s Describe Arm Pressure (mmHG)  +----------+--------+--------+--------+-------------------+             121                                             +----------+--------+--------+--------+-------------------+ +---------+--------+--+--------+--+---------+  Vertebral PSV cm/s 52 EDV  cm/s 15 Antegrade  +---------+--------+--+--------+--+---------+    Preliminary   ° ° °Assessment and Plan:  ° °1. Cryptogenic stroke -I have discussed the indications for insertion of an ILR and he wishes to proceed. ° °2. NSTEMI - as per Dr. MC, no plans for invasive eval in the setting of acute renal failure and hemodynamic stability. °For questions or updates, please contact [facility] °Please consult [email or web address] for contact info under  ° ° ° °Signed, °Carolyn Sylvia, MD  °04/07/2019 10:59 AM ° °

## 2019-04-07 NOTE — PMR Pre-admission (Signed)
PMR Admission Coordinator Pre-Admission Assessment  Patient: Gregory [name] is an 68 y.o., male MRN: [id number] DOB: [birth year] Height:   Weight: 88.8 kg              Insurance Information HMO: Yes    PPO:      PCP:      IPA:      80/20:      OTHER:  PRIMARY: [employer]      Policy#: [id number]      Subscriber: Patient CM Name: Lestine Box       Phone#: [phone] [id number]     Fax#: [phone] Pre-Cert#: [id number]      Employer:  [employer] provided by Jeanette Caprice on 04/10/19 for admit 04/10/19 to CIR. Next review date is 04/17/19 with updates sent to [facility] (f): [phone]; [phone] Benefits:  Phone #: NA     Name: [email or web address] Eff. Date: 12/21/18 still active     Deduct: NA      Out of Pocket Max: $3,400 ($0 met)      Life Max: NA CIR: $295/day for days 1-6, $0/day for days 7+      SNF: $0/day for days 1-20, $178/day for days 21-100, limited to 100 days/cal yr Outpatient: limited by medical necessity     Co-Pay: $10-$40 pending service/visit Home Health: 100%, limited by medical necessity      Co-Pay: $0 DME: 80%     Co-Pay: 20% Providers:  SECONDARY:       Policy#:       Subscriber:  CM Name:       Phone#:      Fax#:  Pre-Cert#:       Employer:  Benefits:  Phone #:      Name:  Eff. Date:      Deduct:       Out of Pocket Max:       Life Max:  CIR:       SNF:  Outpatient:      Co-Pay:  Home Health:       Co-Pay:  DME:      Co-Pay:  Medicaid Application Date:       Case Manager:  Disability Application Date:       Case Worker:   The "Data Collection Information Summary" for patients in Inpatient Rehabilitation Facilities with attached "Privacy Act [place] Records" was provided and verbally reviewed with: [facility]  Emergency Contact Information Contact Information    Name Relation Home Work [place] B Other [phone]     Morty, Ortwein   [phone]     Current Medical History  Patient Admitting Diagnosis: 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 Pt presented 04/05/2019 with acute onset of right sided weakness and slurred speech. Cranial CT scan showed possible early gray- white differentiation loss in the insula on the left. Chronic small vessel ischemic changes. MRI/MRA showed a 3.5 centimeter region of acute infarction affecting the left insula and posterior frontal region. No evidence of gross hemorrhage. MRA negative. Patient did not receive TPA. Noted findings of hyperkalemia 6.8 as well as elevated creatinine 10.4 rate from baseline 1.70, troponin 3.29. EKG with ST changes. Carotid Dopplers 40-59% ICA stenosis bilaterally.Echocardiogram with ejection fraction of 60% and normal systolic function.  Cardiology service was consulted for suspect non-STEMI with  medical management recommended. Patient did undergo placement of loop recorder 04/07/2019 per Dr. Lovena Le. Renal ultrasound showed bilateral hydronephrosis with nephrology services consulted as well as urology. A Foley catheter tube was placed and would remain in place until follow-up outpatient urology.Pt's renal function continues to improve 5.78 creatinine 04/10/2019. Maintained on aspirin for CVA prophylaxis. Pt is on a dysphagia #1 honey thick liquid diet. Therapy evaluations completed with recommendations for CIR. The patient is to be admitted for a comprehensive rehabilitation program on 04/10/19.  Complete NIHSS TOTAL: 10 Glasgow Coma Scale Score: 15  Past Medical History  Past Medical History:  Diagnosis Date  . AKI (acute kidney injury) ([place])   . CKD (chronic kidney disease) stage 2, GFR 60-89 ml/min   . CVA (cerebral vascular accident) ([place])   . Hyperlipidemia   . Hypertension   . PVD (peripheral vascular disease) ([place])      Family History  family history is not on file.  Prior Rehab/Hospitalizations:  Has the patient had prior rehab or hospitalizations prior to admission? Yes  Has the patient had major surgery during 100 days prior to admission? Yes  Current Medications   Current Facility-Administered Medications:  .  acetaminophen (TYLENOL) tablet 650 mg, 650 mg, Oral, Q4H PRN **OR** acetaminophen (TYLENOL) solution 650 mg, 650 mg, Per Tube, Q4H PRN **OR** acetaminophen (TYLENOL) suppository 650 mg, 650 mg, Rectal, Q4H PRN, Evans Lance, MD .  acetaminophen (TYLENOL) tablet 325-650 mg, 325-650 mg, Oral, Q4H PRN, Evans Lance, MD .  Derrill Memo ON 04/11/2019] amLODipine (NORVASC) tablet 2.5 mg, 2.5 mg, Oral, Daily, Emokpae, Courage, MD .  aspirin suppository 300 mg, 300 mg, Rectal, Daily, 300 mg at 04/07/19 1010 **OR** aspirin tablet 325 mg, 325 mg, Oral, Daily, Evans Lance, MD, 325 mg at 04/10/19 1105 .  atorvastatin (LIPITOR) tablet 80 mg, 80 mg, Oral, q1800, Satira Sark, MD, 80 mg at 04/09/19 1710 .  cefTRIAXone (ROCEPHIN) 1 g in sodium chloride 0.9 % 100 mL IVPB, 1 g, Intravenous, Q24H, Evans Lance, MD, Last Rate: 200 mL/hr at 04/10/19 1104, 1 g at 04/10/19 1104 .  dextrose 5 % solution, , Intravenous, Continuous, Emokpae, Courage, MD, Last Rate: 60 mL/hr at 04/10/19 1457 .  hydrALAZINE (APRESOLINE) injection 10 mg, 10 mg, Intravenous, Q6H PRN, Evans Lance, MD .  metoprolol tartrate (LOPRESSOR) tablet 25 mg, 25 mg, Oral, QID, Emokpae, Courage, MD, 25 mg at 04/10/19 1613 .  ondansetron (ZOFRAN) injection 4 mg, 4 mg, Intravenous, Q6H PRN, Evans Lance, MD .  Resource ThickenUp Clear, , Oral, PRN, Evans Lance, MD  Patients Current Diet:  Diet Order            DIET - DYS 1 Room service appropriate? Yes; Fluid consistency: Honey Thick  Diet effective now              Precautions / Restrictions Precautions Precautions: Fall Restrictions Weight Bearing Restrictions: No    Has the patient had 2 or more falls or a fall with injury in the past year?Yes  Prior Activity Level Community (5-7x/wk): retired but drove; was Technical brewer prior to retirement. Independet PTA, used RW.   Prior Functional Level Prior Function Level of Independence: Needs assistance Comments: Per chart review/old therapy notes, pt was able to ambulate short distances, and used a scooter for longer distances and required assist for ADLs - chart indicates pt is divorced   Self Care: Did the patient need help bathing, dressing, using the toilet  or eating?  Independent  Indoor Mobility: Did the patient need assistance with walking from room to room (with or without device)? Independent  Stairs: Did the patient need assistance with internal or external stairs (with or without device)? Independent  Functional Cognition: Did the patient need help planning regular tasks such as shopping or remembering to take medications? Independent  Home Assistive Devices / Equipment    Prior Device Use: Indicate devices/aids used by the patient prior to current illness, exacerbation or injury? Walker  Current Functional Level Cognition  Overall Cognitive Status: Difficult to assess Difficult to assess due to: Impaired communication Orientation Level: Oriented to person, Oriented to situation, Disoriented to place, Disoriented to time Following Commands: Follows one step commands with increased time, Follows one step commands inconsistently General Comments: pt is able to follow vc this session and responding to questions with head nods    Extremity Assessment (includes Sensation/Coordination)  Upper Extremity Assessment: RUE deficits/detail RUE Deficits / Details: demonstrates attempt to push off bed surface with R UE and able to abduct from body  RUE Coordination: decreased fine motor, decreased gross motor  Lower Extremity Assessment: Defer to PT evaluation RLE Deficits / Details: no  active movement of R LE throughout session    ADLs  Overall ADL's : Needs assistance/impaired Eating/Feeding: Moderate assistance Eating/Feeding Details (indicate cue type and reason): sat up in chair for tech to feed lunch at this time. poor oral closure demonstrated this session with oral secretions Grooming: Wash/dry hands, Wash/dry face, Oral care, Brushing hair, Moderate assistance, Sitting Grooming Details (indicate cue type and reason): Pt attempted to brush beard/face with toothbrush when asked to demonstrate use of toothbrush  Upper Body Bathing: Maximal assistance, Sitting Lower Body Bathing: Total assistance Lower Body Bathing Details (indicate cue type and reason): incontinence and lacks awarness Upper Body Dressing : Maximal assistance, Sitting Lower Body Dressing: Total assistance, Sit to/from stand Toilet Transfer: +2 for physical assistance, Moderate assistance Toilet Transfer Details (indicate cue type and reason): requires Incr (A) on R side and pushign with L UE Toileting- Clothing Manipulation and Hygiene: Total assistance, Sit to/from stand Functional mobility during ADLs: Maximal assistance, +2 for physical assistance, +2 for safety/equipment General ADL Comments: pt with stedy used to transfer from bed to chair this ession with (A) to sustain static position in stedy    Mobility  Overal bed mobility: Needs Assistance Bed Mobility: Rolling, Sidelying to Sit Rolling: Min guard, Min assist Sidelying to sit: Mod assist Supine to sit: Mod assist, HOB elevated, +2 for physical assistance Sit to supine: Mod assist, +2 for physical assistance General bed mobility comments: pt initiated rolling to R side using R bed rail when given cue to "sit up on the side of the bed"; assist required to bring bilat LE off EOB and to elevate trunk into sitting; cues for sequencing    Transfers  Overall transfer level: Needs assistance Equipment used: 2 person hand held assist Transfer  via Lift Equipment: Stedy Transfers: Sit to/from [employer] to Stand: Max assist, +2 physical assistance, Mod assist, +2 safety/equipment General transfer comment: pt initiating sit to stand using L side to power up and increased assistance required on R side for R UE positioning and powering up; pt with R lateral lean upon standing and requires increased assist to maintain standing; mod A +2 to get into standing and max A (+1-2) to maintain utilizing Stedy standing frame    Ambulation / Gait / Stairs / Emergency planning/management officer  Ambulation/Gait  General Gait Details: unable    Posture / Balance Dynamic Sitting Balance Sitting balance - Comments: assist required initially and then able to maintain sitting balance EOB with min guard Balance Overall balance assessment: Needs assistance Sitting-balance support: Feet supported, Single extremity supported Sitting balance-Leahy Scale: Fair Sitting balance - Comments: assist required initially and then able to maintain sitting balance EOB with min guard Postural control: Right lateral lean Standing balance support: Bilateral upper extremity supported Standing balance-Leahy Scale: Zero Standing balance comment: pt unable to achieve upright posture and with R lateral lean in standing     Special needs/care consideration BiPAP/CPAP: Yes CPAP while in acute;  at one point used CPAP at home at night, but according to son, no longer has it. CPM: no Continuous Drip IV: ceftriaxone, dextrose 5% Dialysis: : no        Days:  no Life Vest: no Oxygen: no Special Bed: no Trach Size: no Wound Vac (area): no      Location: no Skin: left knee abrasion                    Bowel mgmt: last BM: 04/09/19, incontinent Bladder mgmt: external catheter in place Diabetic mgmt: no Behavioral consideration : no Chemo/radiation : no     Previous Home Environment (from acute therapy documentation) Additional Comments: pt with great difficulty speaking but able to nod  "yes" and shake head "no" in response to some questions - pt lives with his spouse and was previously independent  Discharge Living Setting Plans for Discharge Living Setting: Patient's home, Lives with (comment)(lives with ex-wife) Type of Home at Discharge: House Discharge Home Layout: One level Discharge Home Access: Stairs to enter(per son, [place] could help with ramp) Entrance Stairs-Rails: Can reach both Entrance Stairs-Number of Steps: (3-4 steps) Discharge Bathroom Shower/Tub: Tub/shower unit Discharge Bathroom Toilet: Standard Discharge Bathroom Accessibility: Yes How Accessible: Accessible via walker Does the patient have any problems obtaining your medications?: No  Social/Family/Support Systems Patient Roles: Other (Comment)(lives with ex-wife, has son and daughter nearby) Contact Information: ex-wife: Deneise Lever [phone]); son: Joseeduardo [phone]) Anticipated Caregiver: ex-wife, and son and daugther to help out as needed Anticipated Caregiver's Contact Information: see above Ability/Limitations of Caregiver: Min A Caregiver Availability: 24/7 Discharge Plan Discussed with Primary Caregiver: Yes(son and ex-wife) Is Caregiver In Agreement with Plan?: Yes Does Caregiver/Family have Issues with Lodging/Transportation while Pt is in Rehab?: No   Goals/Additional Needs Patient/Family Goal for Rehab: PT/OT: Mod I/Supervision; SLP: Sup/Min A Expected length of stay: 13-20 days Cultural Considerations: NA Dietary Needs: DYS 1, honey thick liquids Equipment Needs: TBD Pt/Family Agrees to Admission and willing to participate: Yes Program Orientation Provided & Reviewed with Pt/Caregiver Including Roles  & Responsibilities: Yes(son, ex-wife, and pt)  Barriers to Discharge: Home environment access/layout  Barriers to Discharge Comments: steps to enter; tub/shower    Decrease burden of Care through IP rehab admission: NA   Possible need for SNF placement upon discharge: Not  anticipated; pt has great social support at DC through multiple family members who are willing and able to assist as needed. Pt ha a good prognosis for further progress through CIR.    Patient Condition: This patient's medical and functional status has changed since the consult dated: 04/07/19 in which the Rehabilitation Physician determined and documented that the patient's condition is appropriate for intensive rehabilitative care in an inpatient rehabilitation facility. See "History of Present Illness" (above) for medical update. Functional changes are: progression in ability to begin  transfer training due to improved medical status that was prohibiting initially; Pt's elevated HR was limiting factor but has stablized to allow for transfers of Mod/Max A x2 using Stedy. Patient's medical and functional status update has been discussed with the Rehabilitation physician and patient remains appropriate for inpatient rehabilitation. Will admit to inpatient rehab today.  Preadmission Screen Completed By:  Jhonnie Garner, OT, 04/10/2019 5:07 PM ______________________________________________________________________   Discussed status with Dr. Naaman Plummer on 04/10/19 at  5:02PM and received approval for admission today.  Admission Coordinator:  Jhonnie Garner, time 5:06PM Sudie Grumbling 04/10/19

## 2019-04-07 NOTE — Consult Note (Signed)
Cardiology Consultation:   Patient ID: Casson Gewirtz MRN: [id number]; DOB: 10/10/1951  Admit date: 04/05/2019 Date of Consult: 04/07/2019  Primary Care Provider: System, Pcp Not In Primary Cardiologist: No primary care provider on file. new Primary Electrophysiologist:  None    Patient Profile:   Erica Roeth is a 68 y.o. 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 He was noted to have an elevated troponin. He had renal failure acutely.He was found to have   who is being seen today for the evaluation of cryptogenic stroke for ILR insertion  at the request of Dr. Pearlean Brownie.  History of Present Illness:   Mr. Widmaier presented with aphasia and right sided weakness and renal failure. He had a troponin of 9. His creatinine has come down from 10 to 7. He was felt to have an obstructive uropathy. He has stabilized and we are asked to see regarding ILR insertion. The patient has a dense expressive aphasia along with right HP.   Past Medical History:  Diagnosis Date   AKI (acute kidney injury) (HCC)    CKD (chronic kidney disease) stage 2, GFR 60-89 ml/min    CVA (cerebral vascular accident) (HCC)    Hyperlipidemia    Hypertension    PVD (peripheral vascular disease) (HCC)     Past Surgical History:  Procedure Laterality Date   AORTIC ARCH ANGIOGRAPHY N/A 09/09/2018   Procedure: AORTIC ARCH ANGIOGRAPHY;  Surgeon: Sherren Kerns, MD;  Location: [facility];  Service: Cardiovascular;  Laterality: N/A;   CAROTID ANGIOGRAPHY Bilateral 09/09/2018   Procedure: CAROTID ANGIOGRAPHY;  Surgeon: Sherren Kerns, MD;  Location: [facility];  Service: Cardiovascular;  Laterality: Bilateral;     Home Medications:  Prior to Admission medications   Medication Sig Start Date End Date Taking? Authorizing Provider  allopurinol (ZYLOPRIM) 300 MG tablet Take 300 mg by mouth daily.   Yes [provider]  aspirin EC 81  MG EC tablet Take 1 tablet (81 mg total) by mouth daily. 09/06/18  Yes Lanelle Bal, MD  atorvastatin (LIPITOR) 80 MG tablet Take 1 tablet (80 mg total) by mouth daily at 6 PM. 09/05/18  Yes Harbrecht, Lyman Bishop, MD  hydrochlorothiazide (HYDRODIURIL) 50 MG tablet Take 50 mg by mouth daily.   Yes [provider]  lisinopril (PRINIVIL,ZESTRIL) 40 MG tablet Take 40 mg by mouth daily.   Yes [provider]  clopidogrel (PLAVIX) 75 MG tablet Take 1 tablet (75 mg total) by mouth daily. 09/06/18   Lanelle Bal, MD  diclofenac (VOLTAREN) 75 MG EC tablet Take 75 mg by mouth at bedtime as needed for mild pain or moderate pain.    [provider]    Inpatient Medications: Scheduled Meds:  aspirin  300 mg Rectal Daily   Or   aspirin  325 mg Oral Daily   cloNIDine  0.2 mg Transdermal Weekly   metoprolol tartrate  5 mg Intravenous Q8H   Continuous Infusions:  cefTRIAXone (ROCEPHIN)  IV 1 g (04/06/19 1238)   PRN Meds: acetaminophen **OR** acetaminophen (TYLENOL) oral liquid 160 mg/5 mL **OR** acetaminophen, hydrALAZINE  Allergies:   No Known Allergies  Social History:   Social History   Socioeconomic History   Marital status: Divorced    Spouse name: Not on file   Number of children: Not on file   Years of education: Not on file   Highest education level: Not on file  Occupational History  Not on file  Social Needs   Financial resource strain: Not on file   Food insecurity:    Worry: Not on file    Inability: Not on file   Transportation needs:    Medical: Not on file    Non-medical: Not on file  Tobacco Use   Smoking status: Not on file  Substance and Sexual Activity   Alcohol use: Not on file   Drug use: Not on file   Sexual activity: Not on file  Lifestyle   Physical activity:    Days per week: Not on file    Minutes per session: Not on file   Stress: Not on file  Relationships   Social connections:    Talks on  phone: Not on file    Gets together: Not on file    Attends religious service: Not on file    Active member of club or organization: Not on file    Attends meetings of clubs or organizations: Not on file    Relationship status: Not on file   Intimate partner violence:    Fear of current or ex partner: Not on file    Emotionally abused: Not on file    Physically abused: Not on file    Forced sexual activity: Not on file  Other Topics Concern   Not on file  Social History Narrative   Not on file    Family History:   No SCD  ROS:  Please see the history of present illness.  Patient is aphasic All other ROS reviewed and negative.     Physical Exam/Data:   Vitals:   04/07/19 0402 04/07/19 0709 04/07/19 0740 04/07/19 0826  BP: (!) 170/62 (!) 147/82 (!) 166/82 (!) 202/149  Pulse:  100 86 95  Resp:  16 19 (!) 21  Temp: 98.5 F (36.9 C)   98.1 F (36.7 C)  TempSrc: Oral   Oral  SpO2:  99% 99% 98%  Weight:        Intake/Output Summary (Last 24 hours) at 04/07/2019 1059 Last data filed at 04/07/2019 1022 Gross per 24 hour  Intake 4723.66 ml  Output 3190 ml  Net 1533.66 ml   Last 3 Weights 04/05/2019 09/09/2018 09/04/2018  Weight (lbs) 195 lb 12.3 oz 220 lb 235 lb 7.2 oz  Weight (kg) 88.8 kg 99.791 kg 106.8 kg     Body mass index is 33.6 kg/m.  General:  Well nourished, well developed, in no acute distress HEENT: normal Lymph: no adenopathy Neck: no JVD, did not examine carotids Endocrine:  No thryomegaly Vascular: No carotid bruits; FA pulses 2+ bilaterally without bruits  Cardiac:  normal S1, S2; RRR; no murmur  Lungs:  clear to auscultation bilaterally, no wheezing, rhonchi or rales  Abd: soft, nontender, no hepatomegaly  Ext: no edema Musculoskeletal:  No deformities, BUE and BLE strength normal and equal Skin: warm and dry  Neuro:  Expressive aphasia and dense HP Psych:  Normal affect   EKG:  The EKG was personally reviewed and demonstrates:  NSR Telemetry:   Telemetry was personally reviewed and demonstrates:  Nsr/sinus tachycardia  Relevant CV Studies:   Laboratory Data:  Chemistry Recent Labs  Lab 04/06/19 1337 04/06/19 2019 04/07/19 0906  NA 143 143 145  K 5.4* 5.5* 5.0  CL 105 103 98  CO2 20* 24 32  GLUCOSE 97 109* 112*  BUN 144* 138* 129*  CREATININE 8.54* 8.05* 7.33*  CALCIUM 8.3* 8.2* 7.9*  GFRNONAA 6* 6* 7*  GFRAA 7* 7* 8*  ANIONGAP 18* 16* 15    Recent Labs  Lab 04/05/19 0946 04/06/19 0015  PROT 7.8  --   ALBUMIN 3.9 3.3*  AST 43*  --   ALT 24  --   ALKPHOS 133*  --   BILITOT 0.6  --    Hematology Recent Labs  Lab 04/06/19 1337 04/06/19 2019 04/07/19 0906  WBC 8.3 10.1 14.0*  RBC 2.75* 3.58* 3.43*  HGB 7.8* 9.9* 9.4*  HCT 24.4* 31.6* 30.4*  MCV 88.7 88.3 88.6  MCH 28.4 27.7 27.4  MCHC 32.0 31.3 30.9  RDW 13.2 13.2 13.1  PLT 179 182 164   Cardiac Enzymes Recent Labs  Lab 04/05/19 1055 04/05/19 1830 04/06/19 0015 04/06/19 0855 04/06/19 2019  TROPONINI 3.29* 10.69* 13.96* 17.34* 9.60*    Recent Labs  Lab 04/05/19 1118  TROPIPOC 3.99*    BNPNo results for input(s): BNP, PROBNP in the last 168 hours.  DDimer No results for input(s): DDIMER in the last 168 hours.  Radiology/Studies:  Mr Shirlee LatchMra Head WUWo Contrast  Result Date: 04/05/2019 CLINICAL DATA:  Acute presentation with right-sided weakness and speech disturbance. EXAM: MRI HEAD WITHOUT CONTRAST MRA HEAD WITHOUT CONTRAST MRA NECK WITHOUT CONTRAST TECHNIQUE: Multiplanar, multiecho pulse sequences of the brain and surrounding structures were obtained without intravenous contrast. Angiographic images of the Circle of Willis were obtained using MRA technique without intravenous contrast. Angiographic images of the neck were obtained using MRA technique without intravenous contrast. Carotid stenosis measurements (when applicable) are obtained utilizing NASCET criteria, using the distal internal carotid diameter as the denominator. COMPARISON:  Head  CT same day.  MRI 09/03/2018. FINDINGS: MRI HEAD FINDINGS Brain: Diffusion imaging confirms acute infarction in the left insula and posterior frontal region. The area of involvement measures about 3.5 cm in size. No mass effect or visible hemorrhage. No hydrocephalus. Chronic small-vessel ischemic changes of the deep white matter seen elsewhere. Vascular: Major vessels at the base of the brain show flow. Skull and upper cervical spine: Negative Sinuses/Orbits: Clear/normal Other: None MRA HEAD FINDINGS Both internal carotid arteries are patent through the skull base and siphon regions with atherosclerotic narrowing and irregularity in the siphon regions. Supraclinoid internal carotid arteries are widely patent. The anterior and middle cerebral vessels appear to show flow without visible large or medium vessel occlusion. Distal branch vessels do show some atherosclerotic irregularity. The left vertebral artery supplies the basilar. No antegrade flow seen in the right vertebral artery. The basilar artery shows atherosclerotic narrowing and irregularity diffusely. Posterior circulation branch vessels are patent, with both posterior cerebral arteries receiving most of there supply from the anterior circulation. There is long segment stenosis of the left P1 segment, but good flow seen distal to that. MRA NECK FINDINGS There is antegrade flow in both common carotid arteries. The study suffers from pronounced motion degradation. Antegrade flow is seen in both cervical internal carotid arteries, but accurate evaluation of the carotid bifurcations is not possible. There is antegrade flow in the left vertebral artery but none seen on the right. IMPRESSION: 3.5 cm region of acute infarction affecting the left insula and posterior frontal region. No evidence of gross hemorrhage. MR angiography of the neck markedly degraded by motion. Both internal carotid arteries show antegrade flow. No antegrade flow in the right vertebral  artery. Intracranial MR angiography does not show any large or medium vessel occlusion. Widespread atherosclerotic irregularity of the more distal branch vessels. Electronically Signed   By: Scherrie BatemanMark  Shogry M.D.  On: 04/05/2019 10:59   [name] Neck Wo Contrast  Result Date: 04/05/2019 CLINICAL DATA:  Acute presentation with right-sided weakness and speech disturbance. EXAM: MRI HEAD WITHOUT CONTRAST MRA HEAD WITHOUT CONTRAST MRA NECK WITHOUT CONTRAST TECHNIQUE: Multiplanar, multiecho pulse sequences of the brain and surrounding structures were obtained without intravenous contrast. Angiographic images of the Circle of Willis were obtained using MRA technique without intravenous contrast. Angiographic images of the neck were obtained using MRA technique without intravenous contrast. Carotid stenosis measurements (when applicable) are obtained utilizing NASCET criteria, using the distal internal carotid diameter as the denominator. COMPARISON:  Head CT same day.  MRI 09/03/2018. FINDINGS: MRI HEAD FINDINGS Brain: Diffusion imaging confirms acute infarction in the left insula and posterior frontal region. The area of involvement measures about 3.5 cm in size. No mass effect or visible hemorrhage. No hydrocephalus. Chronic small-vessel ischemic changes of the deep white matter seen elsewhere. Vascular: Major vessels at the base of the brain show flow. Skull and upper cervical spine: Negative Sinuses/Orbits: Clear/normal Other: None MRA HEAD FINDINGS Both internal carotid arteries are patent through the skull base and siphon regions with atherosclerotic narrowing and irregularity in the siphon regions. Supraclinoid internal carotid arteries are widely patent. The anterior and middle cerebral vessels appear to show flow without visible large or medium vessel occlusion. Distal branch vessels do show some atherosclerotic irregularity. The left vertebral artery supplies the basilar. No antegrade flow seen in the right  vertebral artery. The basilar artery shows atherosclerotic narrowing and irregularity diffusely. Posterior circulation branch vessels are patent, with both posterior cerebral arteries receiving most of there supply from the anterior circulation. There is long segment stenosis of the left P1 segment, but good flow seen distal to that. MRA NECK FINDINGS There is antegrade flow in both common carotid arteries. The study suffers from pronounced motion degradation. Antegrade flow is seen in both cervical internal carotid arteries, but accurate evaluation of the carotid bifurcations is not possible. There is antegrade flow in the left vertebral artery but none seen on the right. IMPRESSION: 3.5 cm region of acute infarction affecting the left insula and posterior frontal region. No evidence of gross hemorrhage. MR angiography of the neck markedly degraded by motion. Both internal carotid arteries show antegrade flow. No antegrade flow in the right vertebral artery. Intracranial MR angiography does not show any large or medium vessel occlusion. Widespread atherosclerotic irregularity of the more distal branch vessels. Electronically Signed   By: Paulina Fusi M.D.   On: 04/05/2019 10:59   Mr Brain Wo Contrast  Result Date: 04/05/2019 CLINICAL DATA:  Acute presentation with right-sided weakness and speech disturbance. EXAM: MRI HEAD WITHOUT CONTRAST MRA HEAD WITHOUT CONTRAST MRA NECK WITHOUT CONTRAST TECHNIQUE: Multiplanar, multiecho pulse sequences of the brain and surrounding structures were obtained without intravenous contrast. Angiographic images of the Circle of Willis were obtained using MRA technique without intravenous contrast. Angiographic images of the neck were obtained using MRA technique without intravenous contrast. Carotid stenosis measurements (when applicable) are obtained utilizing NASCET criteria, using the distal internal carotid diameter as the denominator. COMPARISON:  Head CT same day.  MRI  09/03/2018. FINDINGS: MRI HEAD FINDINGS Brain: Diffusion imaging confirms acute infarction in the left insula and posterior frontal region. The area of involvement measures about 3.5 cm in size. No mass effect or visible hemorrhage. No hydrocephalus. Chronic small-vessel ischemic changes of the deep white matter seen elsewhere. Vascular: Major vessels at the base of the brain show flow. Skull and upper cervical  spine: Negative Sinuses/Orbits: Clear/normal Other: None MRA HEAD FINDINGS Both internal carotid arteries are patent through the skull base and siphon regions with atherosclerotic narrowing and irregularity in the siphon regions. Supraclinoid internal carotid arteries are widely patent. The anterior and middle cerebral vessels appear to show flow without visible large or medium vessel occlusion. Distal branch vessels do show some atherosclerotic irregularity. The left vertebral artery supplies the basilar. No antegrade flow seen in the right vertebral artery. The basilar artery shows atherosclerotic narrowing and irregularity diffusely. Posterior circulation branch vessels are patent, with both posterior cerebral arteries receiving most of there supply from the anterior circulation. There is long segment stenosis of the left P1 segment, but good flow seen distal to that. MRA NECK FINDINGS There is antegrade flow in both common carotid arteries. The study suffers from pronounced motion degradation. Antegrade flow is seen in both cervical internal carotid arteries, but accurate evaluation of the carotid bifurcations is not possible. There is antegrade flow in the left vertebral artery but none seen on the right. IMPRESSION: 3.5 cm region of acute infarction affecting the left insula and posterior frontal region. No evidence of gross hemorrhage. MR angiography of the neck markedly degraded by motion. Both internal carotid arteries show antegrade flow. No antegrade flow in the right vertebral artery. Intracranial  MR angiography does not show any large or medium vessel occlusion. Widespread atherosclerotic irregularity of the more distal branch vessels. Electronically Signed   By: Paulina Fusi M.D.   On: 04/05/2019 10:59   US Renal  Result Date: 04/05/2019 CLINICAL DATA:  68 year old male with hypertension EXAM: RENAL / URINARY TRACT ULTRASOUND COMPLETE COMPARISON:  None. FINDINGS: Right Kidney: Length: 11.1 cm x 5.3 cm x 6.1 cm, 189 cc. Hydronephrosis. Flow confirmed in the hilum of the right kidney. Left Kidney: Length: 10.9 cm x 5.6 cm x 5.9 cm, 187 cc. Hydronephrosis. Flow confirmed in the hilum of the left kidney. Anechoic cystic structure on the inferior cortex measures 2.6 cm x 1.7 cm with through transmission and no internal color flow. Additional cystic lesion measures 1.1 cm x 1.1 cm x 1.0 cm, with through transmission and no internal flow. Bladder: Catheter in place within the bladder. IMPRESSION: Bilateral hydronephrosis. If there is concern for distal obstruction, CT may be indicated. Left-sided renal cysts. Electronically Signed   By: Gilmer Mor D.O.   On: 04/05/2019 15:39   Dg Chest Port 1 View  Result Date: 04/05/2019 CLINICAL DATA:  Altered behavior. EXAM: PORTABLE CHEST 1 VIEW COMPARISON:  None. FINDINGS: Lungs are adequately inflated and otherwise clear. Borderline cardiomegaly. Mediastinum is within normal. Fusion hardware over the lower cervical spine intact. Minimal degenerative change of the spine. IMPRESSION: No acute findings. Electronically Signed   By: Elberta Fortis M.D.   On: 04/05/2019 11:32   Ct Renal Stone Study  Result Date: 04/06/2019 CLINICAL DATA:  Hydronephrosis. EXAM: CT ABDOMEN AND PELVIS WITHOUT CONTRAST TECHNIQUE: Multidetector CT imaging of the abdomen and pelvis was performed following the standard protocol without IV contrast. COMPARISON:  Ultrasound of April 05, 2019. FINDINGS: Lower chest: No acute abnormality. Hepatobiliary: No focal liver abnormality is seen. No  gallstones, gallbladder wall thickening, or biliary dilatation. Pancreas: Unremarkable. No pancreatic ductal dilatation or surrounding inflammatory changes. Spleen: Normal in size without focal abnormality. Adrenals/Urinary Tract: Adrenal glands appear normal. Moderate bilateral hydroureteronephrosis is noted without evidence of renal or ureteral calculi. Catheter is noted in urinary bladder which appears to be decompressed. However, there appears to be significant diffuse  wall thickening of urinary bladder which may be due to lack of distension, but is concerning for cystitis. Clinical correlation is recommended. Stomach/Bowel: Stomach is within normal limits. Appendix appears normal. No evidence of bowel wall thickening, distention, or inflammatory changes. Vascular/Lymphatic: Aortic atherosclerosis. No enlarged abdominal or pelvic lymph nodes. Reproductive: Prostate is unremarkable. Other: No abdominal wall hernia or abnormality. No abdominopelvic ascites. Musculoskeletal: No acute or significant osseous findings. IMPRESSION: Moderate bilateral hydroureteronephrosis is noted without evidence of obstructing calculus. Diffuse wall thickening of urinary bladder is noted which may be due to lack of distension, but is concerning for cystitis. Clinical correlation is recommended. Catheter is noted within urinary bladder. Aortic Atherosclerosis (ICD10-I70.0). Electronically Signed   By: Lupita Raider M.D.   On: 04/06/2019 09:24   Ct Head Code Stroke Wo Contrast  Result Date: 04/05/2019 CLINICAL DATA:  Code stroke. Right-sided weakness. Speech disturbance. Last seen normal 0730 hours. EXAM: CT HEAD WITHOUT CONTRAST TECHNIQUE: Contiguous axial images were obtained from the base of the skull through the vertex without intravenous contrast. COMPARISON:  09/03/2018 FINDINGS: Brain: No CT evidence of acute infarction. Chronic small-vessel ischemic changes of the cerebral hemispheric white matter. No mass lesion,  hemorrhage, hydrocephalus or extra-axial collection. Vascular: No definite hyperdense vessel. One could question slight hyperdensity in the left M1 segment. Skull: Negative Sinuses/Orbits: Clear/normal Other: None ASPECTS ([place] Stroke Program Early CT Score) - Ganglionic level infarction (caudate, lentiform nuclei, internal capsule, insula, M1-M3 cortex): 6 - Supraganglionic infarction (M4-M6 cortex): 3 Total score (0-10 with 10 being normal): 9 IMPRESSION: 1. Possible early gray-white differentiation loss in the insula on the left. Chronic small-vessel ischemic changes. 2. One could question slight hyperdensity in the left M1 segment. This is not definite. 3. ASPECTS is 9 4. These results were communicated to Dr. Amada Jupiter at 10:01 amon 4/15/2020by text page via the [employer] messaging system. Electronically Signed   By: Paulina Fusi M.D.   On: 04/05/2019 10:04   Vas US Carotid  Result Date: 04/06/2019 Carotid Arterial Duplex Study Indications:  CVA. Risk Factors: Hypertension, hyperlipidemia, prior CVA. Limitations:  Patient positioning, snoring, body habitus Performing Technologist: Blanch Media RVS  Examination Guidelines: A complete evaluation includes B-mode imaging, spectral Doppler, color Doppler, and power Doppler as needed of all accessible portions of each vessel. Bilateral testing is considered an integral part of a complete examination. Limited examinations for reoccurring indications may be performed as noted.  Right Carotid Findings: +----------+--------+--------+--------+-----------+--------+             PSV cm/s EDV cm/s Stenosis Describe    Comments  +----------+--------+--------+--------+-----------+--------+  CCA Prox   80       16                homogeneous           +----------+--------+--------+--------+-----------+--------+  CCA Distal 164      24                homogeneous           +----------+--------+--------+--------+-----------+--------+  ICA Prox   188      34       1-39%     homogeneous           +----------+--------+--------+--------+-----------+--------+  ICA Distal 112      22                                      +----------+--------+--------+--------+-----------+--------+  ECA        170      11                                      +----------+--------+--------+--------+-----------+--------+ +----------+--------+-------+--------+-------------------+             PSV cm/s EDV cms Describe Arm Pressure (mmHG)  +----------+--------+-------+--------+-------------------+  Subclavian 77                                             +----------+--------+-------+--------+-------------------+ +---------+--------+--------+--------------+  Vertebral PSV cm/s EDV cm/s Not identified  +---------+--------+--------+--------------+  Left Carotid Findings: +----------+--------+--------+--------+-----------+--------+             PSV cm/s EDV cm/s Stenosis Describe    Comments  +----------+--------+--------+--------+-----------+--------+  CCA Prox   181      26                homogeneous           +----------+--------+--------+--------+-----------+--------+  CCA Distal 118      23                homogeneous           +----------+--------+--------+--------+-----------+--------+  ICA Prox   161      28       1-39%    homogeneous           +----------+--------+--------+--------+-----------+--------+  ICA Distal 152      30                                      +----------+--------+--------+--------+-----------+--------+  ECA        170      12                                      +----------+--------+--------+--------+-----------+--------+ +----------+--------+--------+--------+-------------------+  Subclavian PSV cm/s EDV cm/s Describe Arm Pressure (mmHG)  +----------+--------+--------+--------+-------------------+             121                                             +----------+--------+--------+--------+-------------------+ +---------+--------+--+--------+--+---------+  Vertebral PSV cm/s 52 EDV  cm/s 15 Antegrade  +---------+--------+--+--------+--+---------+    Preliminary     Assessment and Plan:   1. Cryptogenic stroke -I have discussed the indications for insertion of an ILR and he wishes to proceed.  2. NSTEMI - as per Dr. Erlinda Hong, no plans for invasive eval in the setting of acute renal failure and hemodynamic stability. For questions or updates, please contact [facility] Please consult [email or web address] for contact info under     Signed, Lewayne Bunting, MD  04/07/2019 10:59 AM

## 2019-04-07 NOTE — Progress Notes (Signed)
Modified Barium Swallow Progress Note  Patient Details  Name: Gregory [name] MRN: [id number] Date of Birth: 07-08-1951  Today's Date: 04/07/2019  Modified Barium Swallow completed.  Full report located under Chart Review in the Imaging Section.  Brief recommendations include the following:  Clinical Impression  Pt presents with a severe oral dysphagia with CN VII and XII weakness impacting labial seal and lingual manipulation of the bolus. There is severe anterior spillage and pooling the right buccal cavity. Pt needs increased time and posterior head tilt to gather and orally transit boluses via slight pumping and passive spillage.  Palatal elevation is weak, does not occur until swallow triggered. There is a plate on the cervical spine starting at C3 causing protrusion of the posterior pharyngeal wall and deflecting delayed nectar liquids into the vestibule. Due to pts body habitus it is very difficult to see the glottis and subglottis. Penetration is visible, strongly suspect airway protection, but cannot confirm. No cough was elicited, but pt could attempt a throat clear. Best textures are honey thick liquids via spoon and puree. Oral suction, second swallows and intermittent throat clearing will be beneficial to clear oral and base of tongue residue post swallow. Hope for increasing automaticity and strength with diet. Meds will need to be crushed.    Swallow Evaluation Recommendations       SLP Diet Recommendations: Dysphagia 1 (Puree) solids   Liquid Administration via: Spoon   Medication Administration: Crushed with puree   Supervision: Full assist for feeding   Compensations: Slow rate;Small sips/bites;Monitor for anterior loss;Lingual sweep for clearance of pocketing;Multiple dry swallows after each bite/sip;Clear throat intermittently   Postural Changes: Seated upright at 90 degrees       Other Recommendations: Have oral suction available   Harlon Ditty, MA CCC-SLP   Acute Rehabilitation Services Pager [phone] Office [phone]  Claudine Mouton 04/07/2019,1:18 PM

## 2019-04-07 NOTE — Progress Notes (Signed)
PT Cancellation Note  Patient Details Name: Gregory [name] MRN: [id number] DOB: 09-15-1951   Cancelled Treatment:    Reason Eval/Treat Not Completed: Patient at procedure or test/unavailable. Pt off of the floor to cath lab. PT will continue to f/u with pt acutely as available and appropriate.    [name] 04/07/2019, 1:35 PM

## 2019-04-07 NOTE — Progress Notes (Signed)
OT Cancellation Note  Patient Details Name: Kenly Canche MRN: [id number] DOB: 1951/10/04   Cancelled Treatment:    Reason Eval/Treat Not Completed: Patient at procedure or test/ unavailable (to cath lab); will follow up for OT eval as schedule permits.  Marcy Siren, OT Supplemental Rehabilitation Services Pager [phone] Office [phone]   Orlando Penner 04/07/2019, 1:39 PM

## 2019-04-07 NOTE — Progress Notes (Signed)
Pt BP is 202/149 (167). Pt states no headache or dizziness. Dr. Jarvis Newcomer notified. Nurse will continue to monitor. Tyron Russell Jennalynn Rivard

## 2019-04-07 NOTE — Progress Notes (Signed)
  Speech Language Pathology Treatment: Cognitive-Linquistic(Aphasia)  Patient Details Name: Gregory [name] MRN: [id number] DOB: 01/15/1951 Today's Date: 04/07/2019 Time: 0912-0932 SLP Time Calculation (min) (ACUTE ONLY): 20 min  Assessment / Plan / Recommendation Clinical Impression  Pt was seen for aphasia therapy and he was cooperative throughout the session. A reduced level of frustration was shown today and pt did not gesture "no" to the SLP when tasks became more difficult. He approximated "six" during  automatic counting sequence but no other intelligible speech demonstrated during the session. He achieved 70% accuracy with complex yes/no questions increasing to 80% with re-phrasing. He required mod-max cues to match words to pictures but was able to identify pictures from a field of two with 75% accuracy increasing to 100% with semantic cues. With a field of three he demonstrated 60% accuracy increasing to 100% with semantic cues. SLP will continue to follow pt.    HPI HPI: Pt is a 68 y.o. male with medical history significant of chronic kidney disease stage III, hypertension, hyperlipidemia, who presented to the hospital with chief complaint of sudden onset right-sided weakness and inability to talk.  MRI of the brain revealed 3.5 cm region of acute infarction affecting the left insula and posterior frontal region.      SLP Plan  Continue with current plan of care       Recommendations                   Follow up Recommendations: Inpatient Rehab SLP Visit Diagnosis: Aphasia (R47.01) Plan: Continue with current plan of care       Dani Wallner I. Vear Clock, MS, CCC-SLP Acute Rehabilitation Services Office number [phone] Pager [phone]               Scheryl Marten 04/07/2019, 9:41 AM

## 2019-04-07 NOTE — Evaluation (Signed)
Clinical/Bedside Swallow Evaluation Patient Details  Name: Gregory [name] MRN: [id number] Date of Birth: 04-08-1951  Today's Date: 04/07/2019 Time: SLP Start Time (ACUTE ONLY): 0945 SLP Stop Time (ACUTE ONLY): 1007 SLP Time Calculation (min) (ACUTE ONLY): 22 min  Past Medical History:  Past Medical History:  Diagnosis Date  . AKI (acute kidney injury) (HCC)   . CKD (chronic kidney disease) stage 2, GFR 60-89 ml/min   . CVA (cerebral vascular accident) (HCC)   . Hyperlipidemia   . Hypertension   . PVD (peripheral vascular disease) (HCC)    Past Surgical History:  Past Surgical History:  Procedure Laterality Date  . AORTIC ARCH ANGIOGRAPHY N/A 09/09/2018   Procedure: AORTIC ARCH ANGIOGRAPHY;  Surgeon: Sherren Kerns, MD;  Location: [facility]AS[facility];  Service: Cardiovascular;  Laterality: N/A;  . CAROTID ANGIOGRAPHY Bilateral 09/09/2018   Procedure: CAROTID ANGIOGRAPHY;  Surgeon: Sherren Kerns, MD;  Location: [facility] INVASIVE CV LAB;  Service: Cardiovascular;  Laterality: Bilateral;   HPI:  Pt is a 68 y.o. male with medical history significant of chronic kidney disease stage III, hypertension, hyperlipidemia, who presented to the hospital with chief complaint of sudden onset right-sided weakness and inability to talk.  MRI of the brain revealed 3.5 cm region of acute infarction affecting the left insula and posterior frontal region.   Assessment / Plan / Recommendation Clinical Impression  Pt demonstrates neuromuscular impairment impacting safety with PO. There is CNVII and XII weakness, and snoring respiration and inability to elicit a cough response concerning for Vagal involvement.   Decreased coordination and inability to initiate and plan motor tasks also suggests apraxia. Overall pt demonstrates ability to participate in an instrumental assessment which is warranted based on presentation. Pt in agreement. Will plan for today.  SLP Visit Diagnosis: Dysphagia, oropharyngeal phase  (R13.12)    Aspiration Risk  Severe aspiration risk    Diet Recommendation NPO   Medication Administration: Via alternative means    Other  Recommendations Oral Care Recommendations: Oral care QID Other Recommendations: Have oral suction available   Follow up Recommendations Inpatient Rehab      Frequency and Duration            Prognosis        Swallow Study   General HPI: Pt is a 68 y.o. male with medical history significant of chronic kidney disease stage III, hypertension, hyperlipidemia, who presented to the hospital with chief complaint of sudden onset right-sided weakness and inability to talk.  MRI of the brain revealed 3.5 cm region of acute infarction affecting the left insula and posterior frontal region. Type of Study: Bedside Swallow Evaluation Previous Swallow Assessment: none Diet Prior to this Study: NPO Temperature Spikes Noted: No Respiratory Status: Room air History of Recent Intubation: No Behavior/Cognition: Alert Self-Feeding Abilities: Able to feed self Patient Positioning: Upright in bed Baseline Vocal Quality: Not observed Volitional Cough: (unable to initiate)    Oral/Motor/Sensory Function Overall Oral Motor/Sensory Function: Moderate impairment Facial ROM: Reduced right;Suspected CN VII (facial) dysfunction Facial Symmetry: Abnormal symmetry right;Suspected CN VII (facial) dysfunction Facial Strength: Reduced right;Suspected CN VII (facial) dysfunction Lingual ROM: Reduced right;Suspected CN XII (hypoglossal) dysfunction Lingual Symmetry: Abnormal symmetry right;Suspected CN XII (hypoglossal) dysfunction Lingual Strength: Reduced Velum: Suspected CN X (Vagus) dysfunction Mandible: Within Functional Limits   Ice Chips Ice chips: Impaired Presentation: Spoon Pharyngeal Phase Impairments: Wet Vocal Quality   Thin Liquid Thin Liquid: Impaired Presentation: Straw;Cup Oral Phase Impairments: Reduced labial seal;Reduced lingual  movement/coordination Oral  Phase Functional Implications: Right anterior spillage Pharyngeal  Phase Impairments: Wet Vocal Quality    Nectar Thick Nectar Thick Liquid: Not tested   Honey Thick Honey Thick Liquid: Not tested   Puree Puree: Not tested   Solid     Solid: Not tested      Alaine Loughney, Riley NearingBonnie Caroline 04/07/2019,10:12 AM

## 2019-04-07 NOTE — Progress Notes (Signed)
PROGRESS NOTE  Gregory [name]  ZOX:[id number] DOB: Oct 14, 1951 DOA: 04/05/2019 PCP: System, Pcp Not In  Brief Narrative: Gregory [name] is a 68 y.o. male with a history of stage III CKD, HTN, HLD who presented to the ED with sudden onset right sided weakness and inability to speak that began the morning of arrival. He demonstrated expressive aphasia with multifocal atrial tachycardia. CT head demonstrated acute CVA in the left insula and posterior frontal region. Neurology was consulted. Creatinine 10.8, BUN 158, K 6.8. Appropriate treatment for hyperkalemia provided, nephrology consulted, and subsequent renal U/S demonstrated bilateral hydronephrosis confirmed on CT renal stone study. Urology was consulted, recommending continued foley and no further intervention as the patient is having urine output. Ceftriaxone started for UTI. He has denied chest pain though troponin was elevated at 3 and trended upward, peaking 4/16 at 17. Cardiology was consulted though catheterization is contraindicated at this time and medical management is recommended. Due to high suspicion for embolic CVA, implanted loop recorder will be inserted.   Assessment & Plan: Principal Problem:   Stroke (cerebrum) (HCC) Active Problems:   Pre-diabetes   CKD (chronic kidney disease) stage 3, GFR 30-59 ml/min (HCC)   Essential hypertension   Demand ischemia (HCC)   Multifocal atrial tachycardia (HCC)   Cerebral infarction due to embolism of left middle cerebral artery (HCC)  Acute renal failure on stage III CKD due to obstructive uropathy: Likely due to obstruction based on bilateral hydronephrosis, slow improvement more consistent with subacute timeline. No evidence of rhabdomyolysis.  - Nephrology following. Hopeful for renal recovery since bladder outlet obstruction has been treated with foley, continues to have urine output, 2.6L yesterday. Will watch for postobstructive diuresis.  - Urology consulted, recommended to  continue foley until outpatient follow up in 3-4 weeks. - DC IVF, start diuretic per nephrology.  Hyperkalemia, metabolic acidosis:  - Appreciate nephrology assistance. Discussed with Dr. Allena Katz. Will DC IVF, start lasix.  - Monitor BMP, continue telemetry. Would start po bicarbonate if passes swallow study.  NSTEMI: No chest pain. Interpretation of troponin elevation complicated by renal failure, MAT, and acute CVA.  - Cardiology consulted. Maximize medical therapy as pt is not a candidate for LHC at this time.   Acute ischemic CVA, ICA stenosis: With right-sided weakness, expressive aphasia. 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 - Neurology following, recommending DAPT for now (awaiting MBSS). Due to suspicion of embolic source, ILR to be placed. Carotid U/S without indication for vascular surgery.  - Ultimate disposition planned to be CIR - Advance diet per SLP recommendations. Needs MBSS.   UTI: CT typical appearance of UTI per urology.  - Continue ceftriaxone, monitor cultures. WBC up but remaining afebrile for now.  Hematuria, acute blood loss anemia: Ferritin not low. Hgb down to 10 from previous baseline of 16. Hematuria clearing, worsened with foley placement.  - Monitor hgb, T&S performed.  HTN, hypertensive urgency: Coarse breath sounds concerning for pulmonary edema in setting of elevated BP - On metoprolol IV for now pending MBS - Clonidine patch added, depending on course, may benefit from NTG/cardene - Lasix IV and follow I/O as above.  Multifocal atrial tachycardia:  - Continue metoprolol, increased dose to improve rate control, BP also up.  DVT prophylaxis: SCDs Code Status: Full Family Communication: Son by phone Disposition Plan: CIR  Consultants:   Neurology  Nephrology   Urology  Cardiology  Procedures:  Echocardiogram 04/06/2019: 1. The left ventricle has normal systolic function, with an ejection  fraction of 55-60%. The cavity size was normal.  There is moderately increased left ventricular wall thickness. Left ventricular diastolic Doppler parameters are consistent with  impaired relaxation. No evidence of left ventricular regional wall motion abnormalities.  2. The right ventricle has normal systolic function. The cavity was normal. There is no increase in right ventricular wall thickness.  3. Mild thickening of the mitral valve leaflet. Mild calcification of the mitral valve leaflet. There is moderate mitral annular calcification present.  4. The aortic valve has an indeterminate number of cusps. Moderate thickening of the aortic valve. Moderate calcification of the aortic valve. Mild-moderate stenosis of the aortic valve.  5. The aortic root is normal in size and structure.  6. No intracardiac thrombi or masses were visualized.  Antimicrobials:  Ceftriaxone 4/16 >>    Subjective: Aphasic but shakes/nods head. Denies trouble breathing, chest pain, palpitations. No change to right sided weakness.  Objective: Vitals:   04/07/19 0402 04/07/19 0709 04/07/19 0740 04/07/19 0826  BP: (!) 170/62 (!) 147/82 (!) 166/82 (!) 202/149  Pulse:  100 86 95  Resp:  16 19 (!) 21  Temp: 98.5 F (36.9 C)   98.1 F (36.7 C)  TempSrc: Oral   Oral  SpO2:  99% 99% 98%  Weight:        Intake/Output Summary (Last 24 hours) at 04/07/2019 1208 Last data filed at 04/07/2019 1022 Gross per 24 hour  Intake 4723.66 ml  Output 3190 ml  Net 1533.66 ml   Filed Weights   04/05/19 0947  Weight: 88.8 kg   Gen: 68 y.o. male in no distress Pulm: Nonlabored, coarse bilaterally. CV: Regular rate and rhythm. No murmur, rub, or gallop. No JVD, trace dependent edema. GI: Abdomen soft, non-tender, non-distended, with normoactive bowel sounds.  Ext: Warm, no deformities Skin: No new rashes, lesions or ulcers on visualized skin. Neuro: Alert, aphasic, right hemiparesis. Appears stable. Psych: Judgement and insight appear intact.  Data Reviewed: I have  personally reviewed following labs and imaging studies  CBC: Recent Labs  Lab 04/05/19 0946 04/06/19 1337 04/06/19 2019 04/07/19 0906  WBC 8.8 8.3 10.1 14.0*  NEUTROABS 6.9  --   --   --   HGB 10.5* 7.8* 9.9* 9.4*  HCT 36.3* 24.4* 31.6* 30.4*  MCV 97.8 88.7 88.3 88.6  PLT 179 179 182 164   Basic Metabolic Panel: Recent Labs  Lab 04/06/19 0015 04/06/19 0818 04/06/19 1337 04/06/19 2019 04/07/19 0906  NA 140 141 143 143 145  K 6.1* 5.7* 5.4* 5.5* 5.0  CL 110 106 105 103 98  CO2 16* 18* 20* 24 32  GLUCOSE 112* 94 97 109* 112*  BUN 154* 147* 144* 138* 129*  CREATININE 9.56* 8.83* 8.54* 8.05* 7.33*  CALCIUM 8.7* 8.2* 8.3* 8.2* 7.9*  PHOS 6.2*  --   --   --   --    GFR: CrCl cannot be calculated (Unknown ideal weight.). Liver Function Tests: Recent Labs  Lab 04/05/19 0946 04/06/19 0015  AST 43*  --   ALT 24  --   ALKPHOS 133*  --   BILITOT 0.6  --   PROT 7.8  --   ALBUMIN 3.9 3.3*   No results for input(s): LIPASE, AMYLASE in the last 168 hours. Recent Labs  Lab 04/05/19 1059  AMMONIA 31   Coagulation Profile: Recent Labs  Lab 04/05/19 0946  INR 1.1   Cardiac Enzymes: Recent Labs  Lab 04/05/19 1055 04/05/19 1313 04/05/19 1830 04/06/19 0015 04/06/19 [id number]  04/06/19 2019  CKTOTAL  --  504*  --   --   --   --   TROPONINI 3.29*  --  10.69* 13.96* 17.34* 9.60*   BNP (last 3 results) No results for input(s): PROBNP in the last 8760 hours. HbA1C: Recent Labs    04/06/19 0015  HGBA1C 6.0*   CBG: Recent Labs  Lab 04/05/19 0947 04/05/19 1120 04/07/19 0038 04/07/19 0448  GLUCAP 95 102* 98 98   Lipid Profile: Recent Labs    04/06/19 0015  CHOL 160  HDL 19*  LDLCALC 110*  TRIG 154*  CHOLHDL 8.4   Thyroid Function Tests: No results for input(s): TSH, T4TOTAL, FREET4, T3FREE, THYROIDAB in the last 72 hours. Anemia Panel: Recent Labs    04/05/19 1313  FERRITIN 509*  TIBC 199*  IRON 121   Urine analysis:    Component Value Date/Time    COLORURINE YELLOW 04/05/2019 1830   APPEARANCEUR CLEAR 04/05/2019 1830   LABSPEC 1.011 04/05/2019 1830   PHURINE 5.0 04/05/2019 1830   GLUCOSEU NEGATIVE 04/05/2019 1830   HGBUR MODERATE (A) 04/05/2019 1830   BILIRUBINUR NEGATIVE 04/05/2019 1830   KETONESUR NEGATIVE 04/05/2019 1830   PROTEINUR NEGATIVE 04/05/2019 1830   UROBILINOGEN 0.2 04/23/2010 1049   NITRITE NEGATIVE 04/05/2019 1830   LEUKOCYTESUR SMALL (A) 04/05/2019 1830   No results found for this or any previous visit (from the past 240 hour(s)).    Radiology Studies: US Renal  Result Date: 04/05/2019 CLINICAL DATA:  68 year old male with hypertension EXAM: RENAL / URINARY TRACT ULTRASOUND COMPLETE COMPARISON:  None. FINDINGS: Right Kidney: Length: 11.1 cm x 5.3 cm x 6.1 cm, 189 cc. Hydronephrosis. Flow confirmed in the hilum of the right kidney. Left Kidney: Length: 10.9 cm x 5.6 cm x 5.9 cm, 187 cc. Hydronephrosis. Flow confirmed in the hilum of the left kidney. Anechoic cystic structure on the inferior cortex measures 2.6 cm x 1.7 cm with through transmission and no internal color flow. Additional cystic lesion measures 1.1 cm x 1.1 cm x 1.0 cm, with through transmission and no internal flow. Bladder: Catheter in place within the bladder. IMPRESSION: Bilateral hydronephrosis. If there is concern for distal obstruction, CT may be indicated. Left-sided renal cysts. Electronically Signed   By: Gilmer Mor D.O.   On: 04/05/2019 15:39   Ct Renal Stone Study  Result Date: 04/06/2019 CLINICAL DATA:  Hydronephrosis. EXAM: CT ABDOMEN AND PELVIS WITHOUT CONTRAST TECHNIQUE: Multidetector CT imaging of the abdomen and pelvis was performed following the standard protocol without IV contrast. COMPARISON:  Ultrasound of April 05, 2019. FINDINGS: Lower chest: No acute abnormality. Hepatobiliary: No focal liver abnormality is seen. No gallstones, gallbladder wall thickening, or biliary dilatation. Pancreas: Unremarkable. No pancreatic ductal  dilatation or surrounding inflammatory changes. Spleen: Normal in size without focal abnormality. Adrenals/Urinary Tract: Adrenal glands appear normal. Moderate bilateral hydroureteronephrosis is noted without evidence of renal or ureteral calculi. Catheter is noted in urinary bladder which appears to be decompressed. However, there appears to be significant diffuse wall thickening of urinary bladder which may be due to lack of distension, but is concerning for cystitis. Clinical correlation is recommended. Stomach/Bowel: Stomach is within normal limits. Appendix appears normal. No evidence of bowel wall thickening, distention, or inflammatory changes. Vascular/Lymphatic: Aortic atherosclerosis. No enlarged abdominal or pelvic lymph nodes. Reproductive: Prostate is unremarkable. Other: No abdominal wall hernia or abnormality. No abdominopelvic ascites. Musculoskeletal: No acute or significant osseous findings. IMPRESSION: Moderate bilateral hydroureteronephrosis is noted without evidence of obstructing calculus. Diffuse  wall thickening of urinary bladder is noted which may be due to lack of distension, but is concerning for cystitis. Clinical correlation is recommended. Catheter is noted within urinary bladder. Aortic Atherosclerosis (ICD10-I70.0). Electronically Signed   By: Lupita Raider M.D.   On: 04/06/2019 09:24   Vas US Carotid  Result Date: 04/06/2019 Carotid Arterial Duplex Study Indications:  CVA. Risk Factors: Hypertension, hyperlipidemia, prior CVA. Limitations:  Patient positioning, snoring, body habitus Performing Technologist: Blanch Media RVS  Examination Guidelines: A complete evaluation includes B-mode imaging, spectral Doppler, color Doppler, and power Doppler as needed of all accessible portions of each vessel. Bilateral testing is considered an integral part of a complete examination. Limited examinations for reoccurring indications may be performed as noted.  Right Carotid Findings:  +----------+--------+--------+--------+-----------+--------+           PSV cm/sEDV cm/sStenosisDescribe   Comments +----------+--------+--------+--------+-----------+--------+ CCA Prox  80      16              homogeneous         +----------+--------+--------+--------+-----------+--------+ CCA Distal164     24              homogeneous         +----------+--------+--------+--------+-----------+--------+ ICA Prox  188     34      1-39%   homogeneous         +----------+--------+--------+--------+-----------+--------+ ICA Distal112     22                                  +----------+--------+--------+--------+-----------+--------+ ECA       170     11                                  +----------+--------+--------+--------+-----------+--------+ +----------+--------+-------+--------+-------------------+           PSV cm/sEDV cmsDescribeArm Pressure (mmHG) +----------+--------+-------+--------+-------------------+ [id number]                                         +----------+--------+-------+--------+-------------------+ +---------+--------+--------+--------------+ VertebralPSV cm/sEDV cm/sNot identified +---------+--------+--------+--------------+  Left Carotid Findings: +----------+--------+--------+--------+-----------+--------+           PSV cm/sEDV cm/sStenosisDescribe   Comments +----------+--------+--------+--------+-----------+--------+ CCA Prox  181     26              homogeneous         +----------+--------+--------+--------+-----------+--------+ CCA Distal118     23              homogeneous         +----------+--------+--------+--------+-----------+--------+ ICA Prox  161     28      1-39%   homogeneous         +----------+--------+--------+--------+-----------+--------+ ICA Distal152     30                                  +----------+--------+--------+--------+-----------+--------+ ECA       170     12                                   +----------+--------+--------+--------+-----------+--------+ +----------+--------+--------+--------+-------------------+ SubclavianPSV cm/sEDV cm/sDescribeArm Pressure (mmHG) +----------+--------+--------+--------+-------------------+  121                                         +----------+--------+--------+--------+-------------------+ +---------+--------+--+--------+--+---------+ VertebralPSV cm/s52EDV cm/s15Antegrade +---------+--------+--+--------+--+---------+    Preliminary     Scheduled Meds: . aspirin  300 mg Rectal Daily   Or  . aspirin  325 mg Oral Daily  . cloNIDine  0.2 mg Transdermal Weekly  . metoprolol tartrate  5 mg Intravenous Q8H   Continuous Infusions: . cefTRIAXone (ROCEPHIN)  IV 1 g (04/07/19 1114)     LOS: 2 days   Time spent: 35 minutes.  Tyrone Nine, MD [facility] [email or web address] Password [facility] 04/07/2019, 12:08 PM

## 2019-04-08 DIAGNOSIS — I10 Essential (primary) hypertension: Secondary | ICD-10-CM

## 2019-04-08 DIAGNOSIS — I471 Supraventricular tachycardia: Secondary | ICD-10-CM

## 2019-04-08 DIAGNOSIS — N179 Acute kidney failure, unspecified: Secondary | ICD-10-CM

## 2019-04-08 DIAGNOSIS — N183 Chronic kidney disease, stage 3 (moderate): Secondary | ICD-10-CM

## 2019-04-08 DIAGNOSIS — I6300 Cerebral infarction due to thrombosis of unspecified precerebral artery: Secondary | ICD-10-CM

## 2019-04-08 LAB — CBC
HCT: 30.6 % — ABNORMAL LOW (ref 39.0–52.0)
Hemoglobin: 9.5 g/dL — ABNORMAL LOW (ref 13.0–17.0)
MCH: 28.1 pg (ref 26.0–34.0)
MCHC: 31 g/dL (ref 30.0–36.0)
MCV: 90.5 fL (ref 80.0–100.0)
Platelets: 150 10*3/uL (ref 150–400)
RBC: 3.38 MIL/uL — ABNORMAL LOW (ref 4.22–5.81)
RDW: 13.3 % (ref 11.5–15.5)
WBC: 10.7 10*3/uL — ABNORMAL HIGH (ref 4.0–10.5)
nRBC: 0.2 % (ref 0.0–0.2)

## 2019-04-08 LAB — BASIC METABOLIC PANEL
Anion gap: 18 — ABNORMAL HIGH (ref 5–15)
BUN: 127 mg/dL — ABNORMAL HIGH (ref 8–23)
CO2: 33 mmol/L — ABNORMAL HIGH (ref 22–32)
Calcium: 8.1 mg/dL — ABNORMAL LOW (ref 8.9–10.3)
Chloride: 96 mmol/L — ABNORMAL LOW (ref 98–111)
Creatinine, Ser: 7.04 mg/dL — ABNORMAL HIGH (ref 0.61–1.24)
GFR calc Af Amer: 8 mL/min — ABNORMAL LOW (ref >60)
GFR calc non Af Amer: 7 mL/min — ABNORMAL LOW (ref >60)
Glucose, Bld: 103 mg/dL — ABNORMAL HIGH (ref 70–99)
Potassium: 4.8 mmol/L (ref 3.5–5.1)
Sodium: 147 mmol/L — ABNORMAL HIGH (ref 135–145)

## 2019-04-08 LAB — MAGNESIUM: Magnesium: 1.5 mg/dL — ABNORMAL LOW (ref 1.7–2.4)

## 2019-04-08 MED ORDER — METOPROLOL TARTRATE 5 MG/5ML IV SOLN
5.0000 mg | Freq: Four times a day (QID) | INTRAVENOUS | Status: DC
  Administered 2019-04-08 – 2019-04-09 (×3): 5 mg via INTRAVENOUS
  Filled 2019-04-08 (×3): qty 5

## 2019-04-08 MED ORDER — MAGNESIUM SULFATE 2 GM/50ML IV SOLN
2.0000 g | Freq: Once | INTRAVENOUS | Status: AC
  Administered 2019-04-08: 12:00:00 2 g via INTRAVENOUS
  Filled 2019-04-08: qty 50

## 2019-04-08 MED ORDER — METOPROLOL TARTRATE 25 MG PO TABS
25.0000 mg | ORAL_TABLET | Freq: Once | ORAL | Status: AC
  Administered 2019-04-08: 25 mg via ORAL
  Filled 2019-04-08: qty 1

## 2019-04-08 MED ORDER — NIFEDIPINE ER OSMOTIC RELEASE 30 MG PO TB24
30.0000 mg | ORAL_TABLET | Freq: Every day | ORAL | Status: DC
  Administered 2019-04-08 – 2019-04-09 (×2): 30 mg via ORAL
  Filled 2019-04-08 (×3): qty 1

## 2019-04-08 MED ORDER — DEXTROSE 5 % IV SOLN
INTRAVENOUS | Status: DC
  Administered 2019-04-08 – 2019-04-10 (×4): via INTRAVENOUS

## 2019-04-08 MED ORDER — ATORVASTATIN CALCIUM 80 MG PO TABS
80.0000 mg | ORAL_TABLET | Freq: Every day | ORAL | Status: DC
  Administered 2019-04-08 – 2019-04-10 (×3): 80 mg via ORAL
  Filled 2019-04-08 (×3): qty 1

## 2019-04-08 NOTE — Progress Notes (Signed)
Patient ID: Gregory [name], male   DOB: [birth year], 68 y.o.   MRN: [id number] [name] Progress Note   Assessment/ Plan:   1.  Acute kidney injury on chronic kidney disease stage III:  Likely obstructive uropathy-seen by urology with recommendations to leave indwelling Foley catheter to alleviate bladder outlet obstruction until seen as an outpatient.  Creatinine improving slowly which suggests that this obstruction was probably present for >2 to 3 weeks and we may see some residual renal injury.  With rising sodium level, begin D5 water for the next 24 hours. 2.  Hyperkalemia: Secondary to acute kidney injury/obstruction.  Corrected with alleviating obstruction and treatment with Lokelma/furosemide. 3.  Anion gap metabolic acidosis: This is likely secondary to acute kidney injury/distal RTA from obstruction-treated medically and now becoming alkalemia.  Not on sodium bicarbonate. 4.  Hypertension: Blood pressure elevated, begin oral antihypertensive therapy. 5.  Anemia: Replete iron stores and stable hemoglobin/hematocrit with resolution of hematuria. 6.  Elevated/rising troponin levels: Medical management at this time in the setting of acute kidney injury 7.  Acute CVA with right-sided weakness/expressive aphasia: Seen yesterday by speech therapy and started on dysphagia 1 diet/honey thick liquids.  Subjective:   Aphasic, denies any pain or shortness of breath   Objective:   BP (!) 142/116 (BP Location: Left Leg)   Pulse 97   Temp 97.6 F (36.4 C) (Oral)   Resp 16   Wt 88.8 kg   SpO2 98%   BMI 33.60 kg/m   Intake/Output Summary (Last 24 hours) at 04/08/2019 0815 Last data filed at 04/08/2019 0522 Gross per 24 hour  Intake 5.83 ml  Output 2250 ml  Net -2244.17 ml   Weight change:   Physical Exam: Gen: Comfortably resting in bed CVS: Pulse regular rhythm, normal rate, S1 and S2 normal Resp: Coarse breath sounds bilaterally, no distinct rales or rhonchi Abd:  Soft, obese, nontender Ext: No lower extremity edema  Imaging: Dg Swallowing Func-speech Pathology  Result Date: 04/07/2019 Objective Swallowing Evaluation: Type of Study: MBS-Modified Barium Swallow Study  Patient Details Name: Gregory [name] MRN: [id number] Date of Birth: 1951-09-01 Today's Date: 04/07/2019 Time: SLP Start Time (ACUTE ONLY): 1140 -SLP Stop Time (ACUTE ONLY): 1200 SLP Time Calculation (min) (ACUTE ONLY): 20 min Past Medical History: Past Medical History: Diagnosis Date . AKI (acute kidney injury) (HCC)  . CKD (chronic kidney disease) stage 2, GFR 60-89 ml/min  . CVA (cerebral vascular accident) (HCC)  . Hyperlipidemia  . Hypertension  . PVD (peripheral vascular disease) (HCC)  Past Surgical History: Past Surgical History: Procedure Laterality Date . AORTIC ARCH ANGIOGRAPHY N/A 09/09/2018  Procedure: AORTIC ARCH ANGIOGRAPHY;  Surgeon: Sherren Kerns, MD;  Location: [facility];  Service: Cardiovascular;  Laterality: N/A; . CAROTID ANGIOGRAPHY Bilateral 09/09/2018  Procedure: CAROTID ANGIOGRAPHY;  Surgeon: Sherren Kerns, MD;  Location: [facility];  Service: Cardiovascular;  Laterality: Bilateral; HPI: Pt is a 68 y.o. male with medical history significant of chronic kidney disease stage III, hypertension, hyperlipidemia, who presented to the hospital with chief complaint of sudden onset right-sided weakness and inability to talk.  MRI of the brain revealed 3.5 cm region of acute infarction affecting the left insula and posterior frontal region.  No data recorded Assessment / Plan / Recommendation CHL IP CLINICAL IMPRESSIONS 04/07/2019 Clinical Impression Pt presents with a severe oral dysphagia with CN VII and XII weakness impacting labial seal and lingual manipulation of the bolus. There is severe anterior spillage  and pooling the right buccal cavity. Pt needs increased time and posterior head tilt to gather and orally transit boluses via slight pumping and passive spillage.   Palatal elevation is weak, does not occur until swallow triggered. There is a plate on the cervical spine starting at C3 causing protrusion of the posterior pharyngeal wall and deflecting delayed nectar liquids into the vestibule. Due to pts body habitus it is very difficult to see the glottis and subglottis. Penetration is visible, strongly suspect airway protection, but cannot confirm. No cough was elicited, but pt could attempt a throat clear. Best textures are honey thick liquids via spoon and puree. Oral suction, second swallows and intermittent throat clearing will be beneficial to clear oral and base of tongue residue post swallow. Hope for increasing automaticity and strength with diet. Meds will need to be crushed.   SLP Visit Diagnosis Dysphagia, oropharyngeal phase (R13.12) Attention and concentration deficit following -- Frontal lobe and executive function deficit following -- Impact on safety and function Severe aspiration risk   CHL IP TREATMENT RECOMMENDATION 04/07/2019 Treatment Recommendations Therapy as outlined in treatment plan below   No flowsheet data found. CHL IP DIET RECOMMENDATION 04/07/2019 SLP Diet Recommendations Dysphagia 1 (Puree) solids Liquid Administration via Spoon Medication Administration Crushed with puree Compensations Slow rate;Small sips/bites;Monitor for anterior loss;Lingual sweep for clearance of pocketing;Multiple dry swallows after each bite/sip;Clear throat intermittently Postural Changes Seated upright at 90 degrees   CHL IP OTHER RECOMMENDATIONS 04/07/2019 Recommended Consults -- Oral Care Recommendations -- Other Recommendations Have oral suction available   CHL IP FOLLOW UP RECOMMENDATIONS 04/07/2019 Follow up Recommendations Inpatient Rehab   CHL IP FREQUENCY AND DURATION 04/07/2019 Speech Therapy Frequency (ACUTE ONLY) min 2x/week Treatment Duration 2 weeks      CHL IP ORAL PHASE 04/07/2019 Oral Phase Impaired Oral - Pudding Teaspoon -- Oral - Pudding Cup -- Oral -  Honey Teaspoon Decreased bolus cohesion;Premature spillage;Delayed oral transit;Lingual/palatal residue;Right pocketing in lateral sulci;Reduced posterior propulsion;Lingual pumping;Weak lingual manipulation;Right anterior bolus loss Oral - Honey Cup -- Oral - Nectar Teaspoon Decreased bolus cohesion;Premature spillage;Delayed oral transit;Lingual/palatal residue;Right pocketing in lateral sulci;Reduced posterior propulsion;Lingual pumping;Weak lingual manipulation;Right anterior bolus loss Oral - Nectar Cup Decreased bolus cohesion;Premature spillage;Delayed oral transit;Lingual/palatal residue;Right pocketing in lateral sulci;Reduced posterior propulsion;Lingual pumping;Weak lingual manipulation;Right anterior bolus loss Oral - Nectar Straw -- Oral - Thin Teaspoon -- Oral - Thin Cup -- Oral - Thin Straw -- Oral - Puree Decreased bolus cohesion;Premature spillage;Delayed oral transit;Lingual/palatal residue;Right pocketing in lateral sulci;Reduced posterior propulsion;Lingual pumping;Weak lingual manipulation;Right anterior bolus loss Oral - Mech Soft -- Oral - Regular -- Oral - Multi-Consistency -- Oral - Pill -- Oral Phase - Comment --  CHL IP PHARYNGEAL PHASE 04/07/2019 Pharyngeal Phase Impaired Pharyngeal- Pudding Teaspoon -- Pharyngeal -- Pharyngeal- Pudding Cup -- Pharyngeal -- Pharyngeal- Honey Teaspoon Delayed swallow initiation-vallecula;Pharyngeal residue - valleculae Pharyngeal -- Pharyngeal- Honey Cup -- Pharyngeal -- Pharyngeal- Nectar Teaspoon Delayed swallow initiation-pyriform sinuses;Penetration/Aspiration before swallow;Penetration/Aspiration during swallow;Penetration/Apiration after swallow;Pharyngeal residue - valleculae Pharyngeal Material enters airway, remains ABOVE vocal cords and not ejected out;Material enters airway, remains ABOVE vocal cords then ejected out Pharyngeal- Nectar Cup Delayed swallow initiation-pyriform sinuses;Penetration/Aspiration before swallow;Penetration/Aspiration  during swallow;Penetration/Apiration after swallow;Pharyngeal residue - valleculae Pharyngeal Material enters airway, remains ABOVE vocal cords and not ejected out Pharyngeal- Nectar Straw -- Pharyngeal -- Pharyngeal- Thin Teaspoon -- Pharyngeal -- Pharyngeal- Thin Cup -- Pharyngeal -- Pharyngeal- Thin Straw -- Pharyngeal -- Pharyngeal- Puree Pharyngeal residue - valleculae;Delayed swallow initiation-vallecula Pharyngeal Material does not enter  airway Pharyngeal- Mechanical Soft -- Pharyngeal -- Pharyngeal- Regular -- Pharyngeal -- Pharyngeal- Multi-consistency -- Pharyngeal -- Pharyngeal- Pill -- Pharyngeal -- Pharyngeal Comment --  No flowsheet data found. DeBlois, Riley Nearing 04/07/2019, 1:22 PM              Ct Renal Stone Study  Result Date: 04/06/2019 CLINICAL DATA:  Hydronephrosis. EXAM: CT ABDOMEN AND PELVIS WITHOUT CONTRAST TECHNIQUE: Multidetector CT imaging of the abdomen and pelvis was performed following the standard protocol without IV contrast. COMPARISON:  Ultrasound of April 05, 2019. FINDINGS: Lower chest: No acute abnormality. Hepatobiliary: No focal liver abnormality is seen. No gallstones, gallbladder wall thickening, or biliary dilatation. Pancreas: Unremarkable. No pancreatic ductal dilatation or surrounding inflammatory changes. Spleen: Normal in size without focal abnormality. Adrenals/Urinary Tract: Adrenal glands appear normal. Moderate bilateral hydroureteronephrosis is noted without evidence of renal or ureteral calculi. Catheter is noted in urinary bladder which appears to be decompressed. However, there appears to be significant diffuse wall thickening of urinary bladder which may be due to lack of distension, but is concerning for cystitis. Clinical correlation is recommended. Stomach/Bowel: Stomach is within normal limits. Appendix appears normal. No evidence of bowel wall thickening, distention, or inflammatory changes. Vascular/Lymphatic: Aortic atherosclerosis. No enlarged  abdominal or pelvic lymph nodes. Reproductive: Prostate is unremarkable. Other: No abdominal wall hernia or abnormality. No abdominopelvic ascites. Musculoskeletal: No acute or significant osseous findings. IMPRESSION: Moderate bilateral hydroureteronephrosis is noted without evidence of obstructing calculus. Diffuse wall thickening of urinary bladder is noted which may be due to lack of distension, but is concerning for cystitis. Clinical correlation is recommended. Catheter is noted within urinary bladder. Aortic Atherosclerosis (ICD10-I70.0). Electronically Signed   By: Lupita Raider M.D.   On: 04/06/2019 09:24   Vas US Carotid  Result Date: 04/06/2019 Carotid Arterial Duplex Study Indications:  CVA. Risk Factors: Hypertension, hyperlipidemia, prior CVA. Limitations:  Patient positioning, snoring, body habitus Performing Technologist: Blanch Media RVS  Examination Guidelines: A complete evaluation includes B-mode imaging, spectral Doppler, color Doppler, and power Doppler as needed of all accessible portions of each vessel. Bilateral testing is considered an integral part of a complete examination. Limited examinations for reoccurring indications may be performed as noted.  Right Carotid Findings: +----------+--------+--------+--------+-----------+--------+           PSV cm/sEDV cm/sStenosisDescribe   Comments +----------+--------+--------+--------+-----------+--------+ CCA Prox  80      16              homogeneous         +----------+--------+--------+--------+-----------+--------+ CCA Distal164     24              homogeneous         +----------+--------+--------+--------+-----------+--------+ ICA Prox  188     34      1-39%   homogeneous         +----------+--------+--------+--------+-----------+--------+ ICA Distal112     22                                  +----------+--------+--------+--------+-----------+--------+ ECA       170     11                                   +----------+--------+--------+--------+-----------+--------+ +----------+--------+-------+--------+-------------------+           PSV cm/sEDV cmsDescribeArm Pressure (mmHG) +----------+--------+-------+--------+-------------------+ [id number]                                         +----------+--------+-------+--------+-------------------+ +---------+--------+--------+--------------+  VertebralPSV cm/sEDV cm/sNot identified +---------+--------+--------+--------------+  Left Carotid Findings: +----------+--------+--------+--------+-----------+--------+           PSV cm/sEDV cm/sStenosisDescribe   Comments +----------+--------+--------+--------+-----------+--------+ CCA Prox  181     26              homogeneous         +----------+--------+--------+--------+-----------+--------+ CCA Distal118     23              homogeneous         +----------+--------+--------+--------+-----------+--------+ ICA Prox  161     28      1-39%   homogeneous         +----------+--------+--------+--------+-----------+--------+ ICA Distal152     30                                  +----------+--------+--------+--------+-----------+--------+ ECA       170     12                                  +----------+--------+--------+--------+-----------+--------+ +----------+--------+--------+--------+-------------------+ SubclavianPSV cm/sEDV cm/sDescribeArm Pressure (mmHG) +----------+--------+--------+--------+-------------------+           121                                         +----------+--------+--------+--------+-------------------+ +---------+--------+--+--------+--+---------+ VertebralPSV cm/s52EDV cm/s15Antegrade +---------+--------+--+--------+--+---------+    Preliminary     Labs: BMET Recent Labs  Lab 04/05/19 1313 04/06/19 0015 04/06/19 0818 04/06/19 1337 04/06/19 2019 04/07/19 0906 04/08/19 0357  NA 138 140 141 143 143 145 147*  K  5.9* 6.1* 5.7* 5.4* 5.5* 5.0 4.8  CL 113* 110 106 105 103 98 96*  CO2 10* 16* 18* 20* 24 32 33*  GLUCOSE 131* 112* 94 97 109* 112* 103*  BUN 158* 154* 147* 144* 138* 129* 127*  CREATININE 10.14* 9.56* 8.83* 8.54* 8.05* 7.33* 7.04*  CALCIUM 9.0 8.7* 8.2* 8.3* 8.2* 7.9* 8.1*  PHOS  --  6.2*  --   --   --   --   --    CBC Recent Labs  Lab 04/05/19 0946 04/06/19 1337 04/06/19 2019 04/07/19 0906 04/08/19 0357  WBC 8.8 8.3 10.1 14.0* 10.7*  NEUTROABS 6.9  --   --   --   --   HGB 10.5* 7.8* 9.9* 9.4* 9.5*  HCT 36.3* 24.4* 31.6* 30.4* 30.6*  MCV 97.8 88.7 88.3 88.6 90.5  PLT 179 179 182 164 150    Medications:    . aspirin  300 mg Rectal Daily   Or  . aspirin  325 mg Oral Daily  . cloNIDine  0.2 mg Transdermal Weekly  . metoprolol tartrate  5 mg Intravenous Q8H   Zetta BillsJay Alecxander Mainwaring, MD 04/08/2019, 8:15 AM

## 2019-04-08 NOTE — Progress Notes (Signed)
STROKE TEAM PROGRESS NOTE   INTERVAL HISTORY Patient had loop recorder placed yesterday.  Continues to have significant aphasia.  Is awaiting rehab bed.  Vitals:   04/08/19 0145 04/08/19 0345 04/08/19 0545 04/08/19 0744  BP: (!) 115/57 (!) 113/57 131/67 (!) 142/116  Pulse: 71 96 84 97  Resp: (!) Temp:  97.7 F (36.5 C)  97.6 F (36.4 C)  TempSrc:  Oral  Oral  SpO2: 99% 98% 97% 98%  Weight:        CBC:  Recent Labs  Lab 04/05/19 0946  04/07/19 0906 04/08/19 0357  WBC 8.8   < > 14.0* 10.7*  NEUTROABS 6.9  --   --   --   HGB 10.5*   < > 9.4* 9.5*  HCT 36.3*   < > 30.4* 30.6*  MCV 97.8   < > 88.6 90.5  PLT 179   < > 164 150   < > = values in this interval not displayed.    Basic Metabolic Panel:  Recent Labs  Lab 04/06/19 0015  04/07/19 0906 04/08/19 0357  NA 140   < > 145 147*  K 6.1*   < > 5.0 4.8  CL 110   < > 98 96*  CO2 16*   < > 32 33*  GLUCOSE 112*   < > 112* 103*  BUN 154*   < > 129* 127*  CREATININE 9.56*   < > 7.33* 7.04*  CALCIUM 8.7*   < > 7.9* 8.1*  MG  --   --   --  1.5*  PHOS 6.2*  --   --   --    < > = values in this interval not displayed.   Lipid Panel:     Component Value Date/Time   CHOL 160 04/06/2019 0015   TRIG 154 (H) 04/06/2019 0015   HDL 19 (L) 04/06/2019 0015   CHOLHDL 8.4 04/06/2019 0015   VLDL 31 04/06/2019 0015   LDLCALC 110 (H) 04/06/2019 0015   HgbA1c:  Lab Results  Component Value Date   HGBA1C 6.0 (H) 04/06/2019   Urine Drug Screen: No results found for: LABOPIA, COCAINSCRNUR, LABBENZ, AMPHETMU, THCU, LABBARB  Alcohol Level No results found for: [facility]  IMAGING  Dg Swallowing Func-speech Pathology 04/07/2019 DIET RECOMMENDATION  Dysphagia 1 (Puree) solids Liquid Administration via Spoon Medication Administration Crushed with puree             Vas US Carotid 04/06/2019 Mild bilateral 40 to 59% carotid stenosis.   2D Echocardiogram   1. The left ventricle has normal systolic function, with an  ejection fraction of 55-60%. The cavity size was normal. There is moderately increased left ventricular wall thickness. Left ventricular diastolic Doppler parameters are consistent with  impaired relaxation. No evidence of left ventricular regional wall motion abnormalities.  2. The right ventricle has normal systolic function. The cavity was normal. There is no increase in right ventricular wall thickness.  3. Mild thickening of the mitral valve leaflet. Mild calcification of the mitral valve leaflet. There is moderate mitral annular calcification present.  4. The aortic valve has an indeterminate number of cusps. Moderate thickening of the aortic valve. Moderate calcification of the aortic valve. Mild-moderate stenosis of the aortic valve.  5. The aortic root is normal in size and structure.  6. No intracardiac thrombi or masses were visualized.   PHYSICAL EXAM Pleasant elderly Caucasian male currently not in distress. . Afebrile. Head is nontraumatic. Neck  is supple without bruit.    Cardiac exam no murmur or gallop. Lungs are clear to auscultation. Distal pulses are well felt. Neurological Exam : Patient is awake alert he has diminished attention, registration and recall.  Speech is nonfluent and hesitant but can answer his name and short sentences only.  He has significant expressive aphasia with mild dysarthria.  Follows simple midline and one-step commands only.  Extraocular movements appear full range.  He blinks to threat bilaterally.  Mild right lower facial asymmetry when he smiles.  Tongue midline.  Motor system exam mild right-sided weakness but effort is variable.  Mild weakness of right grip and intrinsic hand muscles.  Orbits left over right upper extremity.  Moves lower extremities fairly well against gravity but left more than right.  Sensation and reflexes are preserved bilaterally.  Gait not tested.   ASSESSMENT/PLAN Mr. Dmonte Greenly is a 68 y.o. male with history of CKD stage  III, HTN, HLD presenting who had some R sided numbness, then found unresponsive on the floor the following morning with Cr 10.48 and K 6.8 on arrival. Positive for NSTEMI.  Stroke:   L insular and posterior frontal infarct embolic secondary to NSTEMI vs AF  Code Stroke CT head L insular grey white differentiation. Chronic small vessel disease. ? LM1 hyperdensity. ASPECTS 9    MRI  L insular and posterior frontal infarct  MRA  head no LVO, no medium occlusion. Widespread atherosclerosis   MRA  Neck degraded. B ICA flow antegrade. No antegrade flow R VA  2D Echo EF 55-60%. No source of embolus   Angio 08/2018 showed B ICA 50% stenoses   Carotid dopplers 40-59% Bilateral - unchanged from 9/19 angio  Loop recorder to look for AF as possible source of stroke. Dr.Taylor implanted 04/07/2019  LDL 110  HgbA1c 6.0  Heparin 5000 units sq tid for VTE prophylaxis Diet Order            DIET - DYS 1 Room service appropriate? Yes; Fluid consistency: Honey Thick  Diet effective now              aspirin 81 mg daily and clopidogrel 75 mg daily prior to admission, now on aspirin 300 mg suppository daily. Recommend continuation of DAPT at time of d/c  Therapy recommendations:  CIR  Disposition:  pending   Hypertension  Stable . From stroke standpoint - Permissive hypertension (OK if < 220/120) but gradually normalize in 5-7 days . Long-term BP goal normotensive  Hyperlipidemia  Home meds:  lipitor 80  Resume intensive statin in hospital once able to swallow  LDL 110, goal < 70  Continue statin at discharge  Other Stroke Risk Factors  Obesity, Body mass index is 33.6 kg/m., recommend weight loss, diet and exercise as appropriate   Hx stroke/TIA  08/2018 - Probable left hemisphericTIA: Likely embolic based on previous infarcts. Placed on aspirin 81 and 75 awaiting VVS consult for ICA dz  Coronary artery disease -Acute  NSTEMI this admission w/ elevated troponins  (10.69-13.96-17.34) - not a candidate for intervention - medical management. Avoid aggressive anticoagulation   Obstructive sleep apnea, on CPAP at home  PVD  Other Active Problems  Hx Gout   UTI - abx  Hematuria d/t UTI  BPH w/ LUTS, irnary retention. Foley. D/t bladder outlet obstruction, leave in foley for 3-4 weeks until follow up  Bilateral hydronephrosis, felt d/t chronic bladder outlet obstruction. Should improve w/ foley  Hyperkalemia 6.8->5.9->5.0->4.8  AKI on CRF  stage III Cr 10.48->10.14->7.33->7.04  Anion gap metabolic acidosis  Multifactorial atrial tachycardia  anemia   PLAN  Inpatient Rehabilitation-Admissions Coordinator plans to f/u Monday. The Stroke Team will sign off at this time. Please call for further questions or concerns. Follow up Dr Pearlean BrownieSethi in 2 months.    Hospital day # 3  Patient presented with embolic left MCA branch infarct.  Of cryptogenic etiology.  He had loop recorder inserted.  Continue antiplatelet therapy for now and aggressive risk factor modification..  Follow-up as an outpatient with stroke clinic in 8 weeks. stroke team will sign off.   To contact Stroke Continuity provider, please refer to [email or web address]. After hours, contact General Neurology

## 2019-04-08 NOTE — Progress Notes (Signed)
Pt's HR keeps jumping to 140s and 150s. Dr. Marisa Severin notified. MD ordered EKG and metoprolol Q6H. Nurse will continue to monitor. Gregory [name]

## 2019-04-08 NOTE — Progress Notes (Signed)
Progress Note  Patient Name: Gregory [name] Date of Encounter: 04/08/2019  Primary Cardiologist: Dr. Tonny Bollman Electrophysiologist: Dr. Lewayne Bunting  Subjective   Patient is aphasic.  No obvious distress.  Inpatient Medications    Scheduled Meds:  aspirin  300 mg Rectal Daily   Or   aspirin  325 mg Oral Daily   metoprolol tartrate  5 mg Intravenous Q8H   NIFEdipine  30 mg Oral Daily   Continuous Infusions:  cefTRIAXone (ROCEPHIN)  IV Stopped (04/07/19 1122)   dextrose 100 mL/hr at 04/08/19 0948   PRN Meds: acetaminophen **OR** acetaminophen (TYLENOL) oral liquid 160 mg/5 mL **OR** acetaminophen, acetaminophen, hydrALAZINE, ondansetron (ZOFRAN) IV, Resource ThickenUp Clear   Vital Signs    Vitals:   04/08/19 0145 04/08/19 0345 04/08/19 0545 04/08/19 0744  BP: (!) 115/57 (!) 113/57 131/67 (!) 142/116  Pulse: 71 96 84 97  Resp: (!) Temp:  97.7 F (36.5 C)  97.6 F (36.4 C)  TempSrc:  Oral  Oral  SpO2: 99% 98% 97% 98%  Weight:        Intake/Output Summary (Last 24 hours) at 04/08/2019 1022 Last data filed at 04/08/2019 1011 Gross per 24 hour  Intake 215.83 ml  Output 1700 ml  Net -1484.17 ml   Filed Weights   04/05/19 0947  Weight: 88.8 kg    Telemetry    Multifocal atrial tachycardia.  Personally reviewed.  Physical Exam   GEN:  Obese male.  No acute distress.   Neck:  Increased girth, unable to assess JVD. Cardiac:  Irregular, 2/6 systolic murmur, no gallop.  Respiratory:  Scattered rhonchi without wheezing. GI: Soft, nontender, bowel sounds present. MS: No edema; No deformity. Neuro:   Aphasic, right-sided weakness.  Labs    Chemistry Recent Labs  Lab 04/05/19 0946  04/06/19 0015  04/06/19 2019 04/07/19 0906 04/08/19 0357  NA 138   < > 140   < > 143 145 147*  K 6.8*   < > 6.1*   < > 5.5* 5.0 4.8  CL 112*   < > 110   < > 103 98 96*  CO2 10*   < > 16*   < > 24 32 33*  GLUCOSE 115*   < > 112*   < > 109* 112*  103*  BUN 158*   < > 154*   < > 138* 129* 127*  CREATININE 10.48*   < > 9.56*   < > 8.05* 7.33* 7.04*  CALCIUM 9.1   < > 8.7*   < > 8.2* 7.9* 8.1*  PROT 7.8  --   --   --   --   --   --   ALBUMIN 3.9  --  3.3*  --   --   --   --   AST 43*  --   --   --   --   --   --   ALT 24  --   --   --   --   --   --   ALKPHOS 133*  --   --   --   --   --   --   BILITOT 0.6  --   --   --   --   --   --   GFRNONAA 4*   < > 5*   < > 6* 7* 7*  GFRAA 5*   < > 6*   < > 7* 8* 8*  ANIONGAP 16*   < > 14   < > 16* 15 18*   < > = values in this interval not displayed.     Hematology Recent Labs  Lab 04/06/19 2019 04/07/19 0906 04/08/19 0357  WBC 10.1 14.0* 10.7*  RBC 3.58* 3.43* 3.38*  HGB 9.9* 9.4* 9.5*  HCT 31.6* 30.4* 30.6*  MCV 88.3 88.6 90.5  MCH 27.7 27.4 28.1  MCHC 31.3 30.9 31.0  RDW 13.2 13.1 13.3  PLT 182 164 150    Cardiac Enzymes Recent Labs  Lab 04/05/19 1830 04/06/19 0015 04/06/19 0855 04/06/19 2019  TROPONINI 10.69* 13.96* 17.34* 9.60*    Recent Labs  Lab 04/05/19 1118  TROPIPOC 3.99*     Radiology    Dg Swallowing Func-speech Pathology  Result Date: 04/07/2019 Objective Swallowing Evaluation: Type of Study: MBS-Modified Barium Swallow Study  Patient Details Name: Gregory [name] MRN: [id number] Date of Birth: 09/02/1951 Today's Date: 04/07/2019 Time: SLP Start Time (ACUTE ONLY): 1140 -SLP Stop Time (ACUTE ONLY): 1200 SLP Time Calculation (min) (ACUTE ONLY): 20 min Past Medical History: Past Medical History: Diagnosis Date  AKI (acute kidney injury) (HCC)   CKD (chronic kidney disease) stage 2, GFR 60-89 ml/min   CVA (cerebral vascular accident) (HCC)   Hyperlipidemia   Hypertension   PVD (peripheral vascular disease) (HCC)  Past Surgical History: Past Surgical History: Procedure Laterality Date  AORTIC ARCH ANGIOGRAPHY N/A 09/09/2018  Procedure: AORTIC ARCH ANGIOGRAPHY;  Surgeon: Sherren KernsFields, Charles E, MD;  Location: [facility];  Service: Cardiovascular;   Laterality: N/A;  CAROTID ANGIOGRAPHY Bilateral 09/09/2018  Procedure: CAROTID ANGIOGRAPHY;  Surgeon: Sherren KernsFields, Charles E, MD;  Location: [facility];  Service: Cardiovascular;  Laterality: Bilateral; HPI: Pt is a 68 y.o. male with medical history significant of chronic kidney disease stage III, hypertension, hyperlipidemia, who presented to the hospital with chief complaint of sudden onset right-sided weakness and inability to talk.  MRI of the brain revealed 3.5 cm region of acute infarction affecting the left insula and posterior frontal region.  No data recorded Assessment / Plan / Recommendation CHL IP CLINICAL IMPRESSIONS 04/07/2019 Clinical Impression Pt presents with a severe oral dysphagia with CN VII and XII weakness impacting labial seal and lingual manipulation of the bolus. There is severe anterior spillage and pooling the right buccal cavity. Pt needs increased time and posterior head tilt to gather and orally transit boluses via slight pumping and passive spillage.  Palatal elevation is weak, does not occur until swallow triggered. There is a plate on the cervical spine starting at C3 causing protrusion of the posterior pharyngeal wall and deflecting delayed nectar liquids into the vestibule. Due to pts body habitus it is very difficult to see the glottis and subglottis. Penetration is visible, strongly suspect airway protection, but cannot confirm. No cough was elicited, but pt could attempt a throat clear. Best textures are honey thick liquids via spoon and puree. Oral suction, second swallows and intermittent throat clearing will be beneficial to clear oral and base of tongue residue post swallow. Hope for increasing automaticity and strength with diet. Meds will need to be crushed.   SLP Visit Diagnosis Dysphagia, oropharyngeal phase (R13.12) Attention and concentration deficit following -- Frontal lobe and executive function deficit following -- Impact on safety and function Severe aspiration  risk   CHL IP TREATMENT RECOMMENDATION 04/07/2019 Treatment Recommendations Therapy as outlined in treatment plan below   No flowsheet data found. CHL IP DIET RECOMMENDATION 04/07/2019 SLP Diet Recommendations  Dysphagia 1 (Puree) solids Liquid Administration via Spoon Medication Administration Crushed with puree Compensations Slow rate;Small sips/bites;Monitor for anterior loss;Lingual sweep for clearance of pocketing;Multiple dry swallows after each bite/sip;Clear throat intermittently Postural Changes Seated upright at 90 degrees   CHL IP OTHER RECOMMENDATIONS 04/07/2019 Recommended Consults -- Oral Care Recommendations -- Other Recommendations Have oral suction available   CHL IP FOLLOW UP RECOMMENDATIONS 04/07/2019 Follow up Recommendations Inpatient Rehab   CHL IP FREQUENCY AND DURATION 04/07/2019 Speech Therapy Frequency (ACUTE ONLY) min 2x/week Treatment Duration 2 weeks      CHL IP ORAL PHASE 04/07/2019 Oral Phase Impaired Oral - Pudding Teaspoon -- Oral - Pudding Cup -- Oral - Honey Teaspoon Decreased bolus cohesion;Premature spillage;Delayed oral transit;Lingual/palatal residue;Right pocketing in lateral sulci;Reduced posterior propulsion;Lingual pumping;Weak lingual manipulation;Right anterior bolus loss Oral - Honey Cup -- Oral - Nectar Teaspoon Decreased bolus cohesion;Premature spillage;Delayed oral transit;Lingual/palatal residue;Right pocketing in lateral sulci;Reduced posterior propulsion;Lingual pumping;Weak lingual manipulation;Right anterior bolus loss Oral - Nectar Cup Decreased bolus cohesion;Premature spillage;Delayed oral transit;Lingual/palatal residue;Right pocketing in lateral sulci;Reduced posterior propulsion;Lingual pumping;Weak lingual manipulation;Right anterior bolus loss Oral - Nectar Straw -- Oral - Thin Teaspoon -- Oral - Thin Cup -- Oral - Thin Straw -- Oral - Puree Decreased bolus cohesion;Premature spillage;Delayed oral transit;Lingual/palatal residue;Right pocketing in lateral  sulci;Reduced posterior propulsion;Lingual pumping;Weak lingual manipulation;Right anterior bolus loss Oral - Mech Soft -- Oral - Regular -- Oral - Multi-Consistency -- Oral - Pill -- Oral Phase - Comment --  CHL IP PHARYNGEAL PHASE 04/07/2019 Pharyngeal Phase Impaired Pharyngeal- Pudding Teaspoon -- Pharyngeal -- Pharyngeal- Pudding Cup -- Pharyngeal -- Pharyngeal- Honey Teaspoon Delayed swallow initiation-vallecula;Pharyngeal residue - valleculae Pharyngeal -- Pharyngeal- Honey Cup -- Pharyngeal -- Pharyngeal- Nectar Teaspoon Delayed swallow initiation-pyriform sinuses;Penetration/Aspiration before swallow;Penetration/Aspiration during swallow;Penetration/Apiration after swallow;Pharyngeal residue - valleculae Pharyngeal Material enters airway, remains ABOVE vocal cords and not ejected out;Material enters airway, remains ABOVE vocal cords then ejected out Pharyngeal- Nectar Cup Delayed swallow initiation-pyriform sinuses;Penetration/Aspiration before swallow;Penetration/Aspiration during swallow;Penetration/Apiration after swallow;Pharyngeal residue - valleculae Pharyngeal Material enters airway, remains ABOVE vocal cords and not ejected out Pharyngeal- Nectar Straw -- Pharyngeal -- Pharyngeal- Thin Teaspoon -- Pharyngeal -- Pharyngeal- Thin Cup -- Pharyngeal -- Pharyngeal- Thin Straw -- Pharyngeal -- Pharyngeal- Puree Pharyngeal residue - valleculae;Delayed swallow initiation-vallecula Pharyngeal Material does not enter airway Pharyngeal- Mechanical Soft -- Pharyngeal -- Pharyngeal- Regular -- Pharyngeal -- Pharyngeal- Multi-consistency -- Pharyngeal -- Pharyngeal- Pill -- Pharyngeal -- Pharyngeal Comment --  No flowsheet data found. DeBlois, Riley Nearing 04/07/2019, 1:22 PM              Vas US Carotid  Result Date: 04/06/2019 Carotid Arterial Duplex Study Indications:  CVA. Risk Factors: Hypertension, hyperlipidemia, prior CVA. Limitations:  Patient positioning, snoring, body habitus Performing  Technologist: Blanch Media RVS  Examination Guidelines: A complete evaluation includes B-mode imaging, spectral Doppler, color Doppler, and power Doppler as needed of all accessible portions of each vessel. Bilateral testing is considered an integral part of a complete examination. Limited examinations for reoccurring indications may be performed as noted.  Right Carotid Findings: +----------+--------+--------+--------+-----------+--------+             PSV cm/s EDV cm/s Stenosis Describe    Comments  +----------+--------+--------+--------+-----------+--------+  CCA Prox   80       16                homogeneous           +----------+--------+--------+--------+-----------+--------+  CCA Distal 164      24  homogeneous           +----------+--------+--------+--------+-----------+--------+  ICA Prox   188      34       1-39%    homogeneous           +----------+--------+--------+--------+-----------+--------+  ICA Distal 112      22                                      +----------+--------+--------+--------+-----------+--------+  ECA        170      11                                      +----------+--------+--------+--------+-----------+--------+ +----------+--------+-------+--------+-------------------+             PSV cm/s EDV cms Describe Arm Pressure (mmHG)  +----------+--------+-------+--------+-------------------+  Subclavian 77                                             +----------+--------+-------+--------+-------------------+ +---------+--------+--------+--------------+  Vertebral PSV cm/s EDV cm/s Not identified  +---------+--------+--------+--------------+  Left Carotid Findings: +----------+--------+--------+--------+-----------+--------+             PSV cm/s EDV cm/s Stenosis Describe    Comments  +----------+--------+--------+--------+-----------+--------+  CCA Prox   181      26                homogeneous           +----------+--------+--------+--------+-----------+--------+  CCA  Distal 118      23                homogeneous           +----------+--------+--------+--------+-----------+--------+  ICA Prox   161      28       1-39%    homogeneous           +----------+--------+--------+--------+-----------+--------+  ICA Distal 152      30                                      +----------+--------+--------+--------+-----------+--------+  ECA        170      12                                      +----------+--------+--------+--------+-----------+--------+ +----------+--------+--------+--------+-------------------+  Subclavian PSV cm/s EDV cm/s Describe Arm Pressure (mmHG)  +----------+--------+--------+--------+-------------------+             121                                             +----------+--------+--------+--------+-------------------+ +---------+--------+--+--------+--+---------+  Vertebral PSV cm/s 52 EDV cm/s 15 Antegrade  +---------+--------+--+--------+--+---------+    Preliminary     Cardiac Studies   Echocardiogram 04/05/2019:  1. The left ventricle has normal systolic function, with an ejection fraction of 55-60%. The cavity size was normal. There is moderately increased left ventricular wall thickness. Left ventricular diastolic  Doppler parameters are consistent with  impaired relaxation. No evidence of left ventricular regional wall motion abnormalities.  2. The right ventricle has normal systolic function. The cavity was normal. There is no increase in right ventricular wall thickness.  3. Mild thickening of the mitral valve leaflet. Mild calcification of the mitral valve leaflet. There is moderate mitral annular calcification present.  4. The aortic valve has an indeterminate number of cusps. Moderate thickening of the aortic valve. Moderate calcification of the aortic valve. Mild-moderate stenosis of the aortic valve.  5. The aortic root is normal in size and structure.  6. No intracardiac thrombi or masses were visualized  Patient Profile     68 y.o.  male with history of CKD stage III, hypertension, and hyperlipidemia now presenting with acute stroke, multifocal atrial tachycardia, acute on chronic renal failure in the setting of obstructive uropathy, and NSTEMI.  Assessment & Plan    1.  NSTEMI with peak troponin I of 17.34.  Patient denies active chest pain by shaking his head when asked.  He shows nonspecific inferolateral ST-T wave abnormalities.  LVEF 55 to 60% without regional wall motion abnormality.  Plan is for medical therapy - initial cardiology consultation and follow-up notes reviewed.  2.  Multifocal atrial tachycardia.  3.  Acute ischemic stroke with right-sided weakness and expressive aphasia. ILR also placed by Dr. Ladona Ridgel to exclude atrial fibrillation.  4.  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  Creatinine 7.04.  Patient received aspirin today and also oral dose of Procardia XL.  Would continue IV Lopressor as conversion to all oral regimen is made based on heart rate control and blood pressure.  Will also start statin therapy.  As noted previously, no further invasive cardiac testing is planned at this time.  Signed, Nona Dell, MD  04/08/2019, 10:22 AM

## 2019-04-08 NOTE — Progress Notes (Signed)
Patient Demographics:    Gregory [name], is a 68 y.o. male, DOB - [birth year], RUE:[id number]  Admit date - 04/05/2019   Admitting Physician Costin Otelia Sergeant, MD  Outpatient Primary MD for the patient is System, Pcp Not In  LOS - 3   No chief complaint on file.       Subjective:    Mercer Pod today has no fevers, no emesis,  No chest pain, patient is able to respond by shaking his head or nodding his head, tachycardia noted  Assessment  & Plan :    Principal Problem:   Stroke (cerebrum) (HCC) Active Problems:   Pre-diabetes   CKD (chronic kidney disease) stage 3, GFR 30-59 ml/min (HCC)   Essential hypertension   Demand ischemia (HCC)   Multifocal atrial tachycardia (HCC)   Cerebral infarction due to embolism of left middle cerebral artery [facility])  Brief summary 68 year old with past medical history relevant for HTN, HLD, CKD stage III admitted on 04/05/2019 with 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 Joya San without hemodynamically significant stenosis, status post loop recorder implantation for possible A. fib by Dr. Ladona Ridgel on 04/07/2019, LDL is 110, A1c is 6.0, MRA head and neck without large vessel occlusion. PTA patient was on aspirin and Plavix, now on aspirin 325 mg daily, Lipitor 80 mg daily, neurologist recommends discharge back on aspirin and Plavix  2) NSTEMI with peak troponin I of 17.34--- echo with EF of 55 to 60%, there was input appreciated, continue metoprolol and Lipitor , currently chest pain-free , per cardiologist no invasive work-up planned continue medical management  3)Multifocal atrial tachycardia-cardiology input  appreciated, continue metoprolol  4) Acute on CKD III with obstructive uropathy.  Creatinine is down to 7.04.  patient has bilateral hydronephrosis due to chronic bladder outlet obstruction, nephrology input appreciated continue dextrose IV per nephrology team   5)FEN/hypernatremia --- patient with free water deficit, speech pathologist consult appreciated, recommend Dysphagia 1 (Puree) solids Liquid Administration via Spoon Medication Administration Crushed with puree.... iv Dextrose solution as per nephrology team  6)HTN--initially permissive hypertension was allowed, may shoot for normotensive blood pressure around 04/10/2019 , IV metoprolol and p.o. Procardia restarted IV hydralazine for elevated BP  7) obesity with OSA--- continue CPAP nightly  8)BPH with LUTs--- patient has bilateral hydronephrosis due to chronic bladder outlet obstruction continue Foley for 3 to 4 weeks and follow-up with urologist  9)Hematuria and Possible UTI--- currently on iv Rocephin, it appears that the urine culture was not sent, may discontinue IV Rocephin after treatment for 5 days , Foley remains in place  Disposition/Need for in-Hospital Stay- patient unable to be discharged at this time due to awaiting possible transfer to inpatient rehab  Code Status : full  Family Communication:   na   Disposition Plan  :  InPatient rehab  Consults  : Neurology/urology/cardiology//nephrology  DVT Prophylaxis  :    SCDs  Lab Results  Component Value Date   PLT 150 04/08/2019    Inpatient Medications  Scheduled Meds:  aspirin  300 mg Rectal Daily   Or   aspirin  325 mg Oral Daily   atorvastatin  80 mg Oral q1800   metoprolol tartrate  5 mg Intravenous Q6H   NIFEdipine  30 mg Oral Daily   Continuous Infusions:  cefTRIAXone (ROCEPHIN)  IV Stopped (04/08/19 1147)   dextrose 100 mL/hr at 04/08/19 1504   PRN Meds:.acetaminophen **OR** acetaminophen (TYLENOL) oral liquid 160 mg/5 mL **OR**  acetaminophen, acetaminophen, hydrALAZINE, ondansetron (ZOFRAN) IV, Resource ThickenUp Clear    Anti-infectives (From admission, onward)   Start     Dose/Rate Route Frequency Ordered Stop   04/06/19 1145  cefTRIAXone (ROCEPHIN) 1 g in sodium chloride 0.9 % 100 mL IVPB     1 g 200 mL/hr over 30 Minutes Intravenous Every 24 hours 04/06/19 1140          Objective:   Vitals:   04/08/19 0345 04/08/19 0545 04/08/19 0744 04/08/19 1114  BP: (!) 113/57 131/67 (!) 142/116 137/80  Pulse: 96 84 97 89  Resp: 19 18 16 12   Temp: 97.7 F (36.5 C)  97.6 F (36.4 C) 98.4 F (36.9 C)  TempSrc: Oral  Oral Oral  SpO2: 98% 97% 98% 94%  Weight:        Wt Readings from Last 3 Encounters:  04/05/19 88.8 kg  09/09/18 99.8 kg  09/04/18 106.8 kg     Intake/Output Summary (Last 24 hours) at 04/08/2019 1549 Last data filed at 04/08/2019 1500 Gross per 24 hour  Intake 652.85 ml  Output 2100 ml  Net -1447.15 ml     Physical Exam Patient is examined daily including today on 04/08/19 , exams remain the same as of yesterday except that has changed   Gen:- Awake Alert,  , no acute distress HEENT:- [place].AT, No sclera icterus Neck-Supple Neck,No JVD,.  Lungs-  CTAB , fair symmetrical air movement CV- S1, S2 normal, irregular, tachycardic  abd-  +ve B.Sounds, Abd Soft, No tenderness,    Extremity/Skin:- No  edema, pedal pulses present  Psych-affect is appropriate, oriented x3 Neuro-aphasia and right-sided hemiplegia GU--Foley catheter with resolving hematuria  Data Review:   Micro Results No results found for this or any previous visit (from the past 240 hour(s)).  Radiology Reports Mr Shirlee LatchMra Head ZOWo Contrast  Result Date: 04/05/2019 CLINICAL DATA:  Acute presentation with right-sided weakness and speech disturbance. EXAM: MRI HEAD WITHOUT CONTRAST MRA HEAD WITHOUT CONTRAST MRA NECK WITHOUT CONTRAST TECHNIQUE: Multiplanar, multiecho pulse sequences of the brain and surrounding structures were  obtained without intravenous contrast. Angiographic images of the Circle of Willis were obtained using MRA technique without intravenous contrast. Angiographic images of the neck were obtained using MRA technique without intravenous contrast. Carotid stenosis measurements (when applicable) are obtained utilizing NASCET criteria, using the distal internal carotid diameter as the denominator. COMPARISON:  Head CT same day.  MRI 09/03/2018. FINDINGS: MRI HEAD FINDINGS Brain: Diffusion imaging confirms acute infarction in the left insula and posterior frontal region. The area of involvement measures about 3.5 cm in size. No mass effect or visible hemorrhage. No hydrocephalus. Chronic small-vessel ischemic changes of the deep white matter seen elsewhere. Vascular: Major vessels at the base of the brain show flow. Skull and upper cervical spine: Negative Sinuses/Orbits: Clear/normal Other: None MRA HEAD FINDINGS Both internal carotid arteries are patent through the skull base and siphon regions with atherosclerotic narrowing and irregularity in the siphon regions. Supraclinoid internal carotid arteries are widely patent. The anterior and middle cerebral vessels appear to  show flow without visible large or medium vessel occlusion. Distal branch vessels do show some atherosclerotic irregularity. The left vertebral artery supplies the basilar. No antegrade flow seen in the right vertebral artery. The basilar artery shows atherosclerotic narrowing and irregularity diffusely. Posterior circulation branch vessels are patent, with both posterior cerebral arteries receiving most of there supply from the anterior circulation. There is long segment stenosis of the left P1 segment, but good flow seen distal to that. MRA NECK FINDINGS There is antegrade flow in both common carotid arteries. The study suffers from pronounced motion degradation. Antegrade flow is seen in both cervical internal carotid arteries, but accurate evaluation  of the carotid bifurcations is not possible. There is antegrade flow in the left vertebral artery but none seen on the right. IMPRESSION: 3.5 cm region of acute infarction affecting the left insula and posterior frontal region. No evidence of gross hemorrhage. MR angiography of the neck markedly degraded by motion. Both internal carotid arteries show antegrade flow. No antegrade flow in the right vertebral artery. Intracranial MR angiography does not show any large or medium vessel occlusion. Widespread atherosclerotic irregularity of the more distal branch vessels. Electronically Signed   By: Paulina Fusi M.D.   On: 04/05/2019 10:59   [name] Neck Wo Contrast  Result Date: 04/05/2019 CLINICAL DATA:  Acute presentation with right-sided weakness and speech disturbance. EXAM: MRI HEAD WITHOUT CONTRAST MRA HEAD WITHOUT CONTRAST MRA NECK WITHOUT CONTRAST TECHNIQUE: Multiplanar, multiecho pulse sequences of the brain and surrounding structures were obtained without intravenous contrast. Angiographic images of the Circle of Willis were obtained using MRA technique without intravenous contrast. Angiographic images of the neck were obtained using MRA technique without intravenous contrast. Carotid stenosis measurements (when applicable) are obtained utilizing NASCET criteria, using the distal internal carotid diameter as the denominator. COMPARISON:  Head CT same day.  MRI 09/03/2018. FINDINGS: MRI HEAD FINDINGS Brain: Diffusion imaging confirms acute infarction in the left insula and posterior frontal region. The area of involvement measures about 3.5 cm in size. No mass effect or visible hemorrhage. No hydrocephalus. Chronic small-vessel ischemic changes of the deep white matter seen elsewhere. Vascular: Major vessels at the base of the brain show flow. Skull and upper cervical spine: Negative Sinuses/Orbits: Clear/normal Other: None MRA HEAD FINDINGS Both internal carotid arteries are patent through the skull base and  siphon regions with atherosclerotic narrowing and irregularity in the siphon regions. Supraclinoid internal carotid arteries are widely patent. The anterior and middle cerebral vessels appear to show flow without visible large or medium vessel occlusion. Distal branch vessels do show some atherosclerotic irregularity. The left vertebral artery supplies the basilar. No antegrade flow seen in the right vertebral artery. The basilar artery shows atherosclerotic narrowing and irregularity diffusely. Posterior circulation branch vessels are patent, with both posterior cerebral arteries receiving most of there supply from the anterior circulation. There is long segment stenosis of the left P1 segment, but good flow seen distal to that. MRA NECK FINDINGS There is antegrade flow in both common carotid arteries. The study suffers from pronounced motion degradation. Antegrade flow is seen in both cervical internal carotid arteries, but accurate evaluation of the carotid bifurcations is not possible. There is antegrade flow in the left vertebral artery but none seen on the right. IMPRESSION: 3.5 cm region of acute infarction affecting the left insula and posterior frontal region. No evidence of gross hemorrhage. MR angiography of the neck markedly degraded by motion. Both internal carotid arteries show antegrade flow. No antegrade  flow in the right vertebral artery. Intracranial MR angiography does not show any large or medium vessel occlusion. Widespread atherosclerotic irregularity of the more distal branch vessels. Electronically Signed   By: Paulina Fusi M.D.   On: 04/05/2019 10:59   Mr Brain Wo Contrast  Result Date: 04/05/2019 CLINICAL DATA:  Acute presentation with right-sided weakness and speech disturbance. EXAM: MRI HEAD WITHOUT CONTRAST MRA HEAD WITHOUT CONTRAST MRA NECK WITHOUT CONTRAST TECHNIQUE: Multiplanar, multiecho pulse sequences of the brain and surrounding structures were obtained without intravenous  contrast. Angiographic images of the Circle of Willis were obtained using MRA technique without intravenous contrast. Angiographic images of the neck were obtained using MRA technique without intravenous contrast. Carotid stenosis measurements (when applicable) are obtained utilizing NASCET criteria, using the distal internal carotid diameter as the denominator. COMPARISON:  Head CT same day.  MRI 09/03/2018. FINDINGS: MRI HEAD FINDINGS Brain: Diffusion imaging confirms acute infarction in the left insula and posterior frontal region. The area of involvement measures about 3.5 cm in size. No mass effect or visible hemorrhage. No hydrocephalus. Chronic small-vessel ischemic changes of the deep white matter seen elsewhere. Vascular: Major vessels at the base of the brain show flow. Skull and upper cervical spine: Negative Sinuses/Orbits: Clear/normal Other: None MRA HEAD FINDINGS Both internal carotid arteries are patent through the skull base and siphon regions with atherosclerotic narrowing and irregularity in the siphon regions. Supraclinoid internal carotid arteries are widely patent. The anterior and middle cerebral vessels appear to show flow without visible large or medium vessel occlusion. Distal branch vessels do show some atherosclerotic irregularity. The left vertebral artery supplies the basilar. No antegrade flow seen in the right vertebral artery. The basilar artery shows atherosclerotic narrowing and irregularity diffusely. Posterior circulation branch vessels are patent, with both posterior cerebral arteries receiving most of there supply from the anterior circulation. There is long segment stenosis of the left P1 segment, but good flow seen distal to that. MRA NECK FINDINGS There is antegrade flow in both common carotid arteries. The study suffers from pronounced motion degradation. Antegrade flow is seen in both cervical internal carotid arteries, but accurate evaluation of the carotid bifurcations  is not possible. There is antegrade flow in the left vertebral artery but none seen on the right. IMPRESSION: 3.5 cm region of acute infarction affecting the left insula and posterior frontal region. No evidence of gross hemorrhage. MR angiography of the neck markedly degraded by motion. Both internal carotid arteries show antegrade flow. No antegrade flow in the right vertebral artery. Intracranial MR angiography does not show any large or medium vessel occlusion. Widespread atherosclerotic irregularity of the more distal branch vessels. Electronically Signed   By: Paulina Fusi M.D.   On: 04/05/2019 10:59   US Renal  Result Date: 04/05/2019 CLINICAL DATA:  68 year old male with hypertension EXAM: RENAL / URINARY TRACT ULTRASOUND COMPLETE COMPARISON:  None. FINDINGS: Right Kidney: Length: 11.1 cm x 5.3 cm x 6.1 cm, 189 cc. Hydronephrosis. Flow confirmed in the hilum of the right kidney. Left Kidney: Length: 10.9 cm x 5.6 cm x 5.9 cm, 187 cc. Hydronephrosis. Flow confirmed in the hilum of the left kidney. Anechoic cystic structure on the inferior cortex measures 2.6 cm x 1.7 cm with through transmission and no internal color flow. Additional cystic lesion measures 1.1 cm x 1.1 cm x 1.0 cm, with through transmission and no internal flow. Bladder: Catheter in place within the bladder. IMPRESSION: Bilateral hydronephrosis. If there is concern for distal obstruction, CT may  be indicated. Left-sided renal cysts. Electronically Signed   By: Gilmer Mor D.O.   On: 04/05/2019 15:39   Dg Chest Port 1 View  Result Date: 04/05/2019 CLINICAL DATA:  Altered behavior. EXAM: PORTABLE CHEST 1 VIEW COMPARISON:  None. FINDINGS: Lungs are adequately inflated and otherwise clear. Borderline cardiomegaly. Mediastinum is within normal. Fusion hardware over the lower cervical spine intact. Minimal degenerative change of the spine. IMPRESSION: No acute findings. Electronically Signed   By: Elberta Fortis M.D.   On: 04/05/2019 11:32    Dg Swallowing Func-speech Pathology  Result Date: 04/07/2019 Objective Swallowing Evaluation: Type of Study: MBS-Modified Barium Swallow Study  Patient Details Name: Nicoli Nardozzi MRN: [id number] Date of Birth: May 09, 1951 Today's Date: 04/07/2019 Time: SLP Start Time (ACUTE ONLY): 1140 -SLP Stop Time (ACUTE ONLY): 1200 SLP Time Calculation (min) (ACUTE ONLY): 20 min Past Medical History: Past Medical History: Diagnosis Date  AKI (acute kidney injury) (HCC)   CKD (chronic kidney disease) stage 2, GFR 60-89 ml/min   CVA (cerebral vascular accident) (HCC)   Hyperlipidemia   Hypertension   PVD (peripheral vascular disease) (HCC)  Past Surgical History: Past Surgical History: Procedure Laterality Date  AORTIC ARCH ANGIOGRAPHY N/A 09/09/2018  Procedure: AORTIC ARCH ANGIOGRAPHY;  Surgeon: Sherren Kerns, MD;  Location: [facility] INVASIVE CV LAB;  Service: Cardiovascular;  Laterality: N/A;  CAROTID ANGIOGRAPHY Bilateral 09/09/2018  Procedure: CAROTID ANGIOGRAPHY;  Surgeon: Sherren Kerns, MD;  Location: [facility] INVASIVE CV LAB;  Service: Cardiovascular;  Laterality: Bilateral; HPI: Pt is a 68 y.o. male with medical history significant of chronic kidney disease stage III, hypertension, hyperlipidemia, who presented to the hospital with chief complaint of sudden onset right-sided weakness and inability to talk.  MRI of the brain revealed 3.5 cm region of acute infarction affecting the left insula and posterior frontal region.  No data recorded Assessment / Plan / Recommendation CHL IP CLINICAL IMPRESSIONS 04/07/2019 Clinical Impression Pt presents with a severe oral dysphagia with CN VII and XII weakness impacting labial seal and lingual manipulation of the bolus. There is severe anterior spillage and pooling the right buccal cavity. Pt needs increased time and posterior head tilt to gather and orally transit boluses via slight pumping and passive spillage.  Palatal elevation is weak, does not occur until swallow  triggered. There is a plate on the cervical spine starting at C3 causing protrusion of the posterior pharyngeal wall and deflecting delayed nectar liquids into the vestibule. Due to pts body habitus it is very difficult to see the glottis and subglottis. Penetration is visible, strongly suspect airway protection, but cannot confirm. No cough was elicited, but pt could attempt a throat clear. Best textures are honey thick liquids via spoon and puree. Oral suction, second swallows and intermittent throat clearing will be beneficial to clear oral and base of tongue residue post swallow. Hope for increasing automaticity and strength with diet. Meds will need to be crushed.   SLP Visit Diagnosis Dysphagia, oropharyngeal phase (R13.12) Attention and concentration deficit following -- Frontal lobe and executive function deficit following -- Impact on safety and function Severe aspiration risk   CHL IP TREATMENT RECOMMENDATION 04/07/2019 Treatment Recommendations Therapy as outlined in treatment plan below   No flowsheet data found. CHL IP DIET RECOMMENDATION 04/07/2019 SLP Diet Recommendations Dysphagia 1 (Puree) solids Liquid Administration via Spoon Medication Administration Crushed with puree Compensations Slow rate;Small sips/bites;Monitor for anterior loss;Lingual sweep for clearance of pocketing;Multiple dry swallows after each bite/sip;Clear throat intermittently Postural Changes Seated upright at  90 degrees   CHL IP OTHER RECOMMENDATIONS 04/07/2019 Recommended Consults -- Oral Care Recommendations -- Other Recommendations Have oral suction available   CHL IP FOLLOW UP RECOMMENDATIONS 04/07/2019 Follow up Recommendations Inpatient Rehab   CHL IP FREQUENCY AND DURATION 04/07/2019 Speech Therapy Frequency (ACUTE ONLY) min 2x/week Treatment Duration 2 weeks      CHL IP ORAL PHASE 04/07/2019 Oral Phase Impaired Oral - Pudding Teaspoon -- Oral - Pudding Cup -- Oral - Honey Teaspoon Decreased bolus cohesion;Premature  spillage;Delayed oral transit;Lingual/palatal residue;Right pocketing in lateral sulci;Reduced posterior propulsion;Lingual pumping;Weak lingual manipulation;Right anterior bolus loss Oral - Honey Cup -- Oral - Nectar Teaspoon Decreased bolus cohesion;Premature spillage;Delayed oral transit;Lingual/palatal residue;Right pocketing in lateral sulci;Reduced posterior propulsion;Lingual pumping;Weak lingual manipulation;Right anterior bolus loss Oral - Nectar Cup Decreased bolus cohesion;Premature spillage;Delayed oral transit;Lingual/palatal residue;Right pocketing in lateral sulci;Reduced posterior propulsion;Lingual pumping;Weak lingual manipulation;Right anterior bolus loss Oral - Nectar Straw -- Oral - Thin Teaspoon -- Oral - Thin Cup -- Oral - Thin Straw -- Oral - Puree Decreased bolus cohesion;Premature spillage;Delayed oral transit;Lingual/palatal residue;Right pocketing in lateral sulci;Reduced posterior propulsion;Lingual pumping;Weak lingual manipulation;Right anterior bolus loss Oral - Mech Soft -- Oral - Regular -- Oral - Multi-Consistency -- Oral - Pill -- Oral Phase - Comment --  CHL IP PHARYNGEAL PHASE 04/07/2019 Pharyngeal Phase Impaired Pharyngeal- Pudding Teaspoon -- Pharyngeal -- Pharyngeal- Pudding Cup -- Pharyngeal -- Pharyngeal- Honey Teaspoon Delayed swallow initiation-vallecula;Pharyngeal residue - valleculae Pharyngeal -- Pharyngeal- Honey Cup -- Pharyngeal -- Pharyngeal- Nectar Teaspoon Delayed swallow initiation-pyriform sinuses;Penetration/Aspiration before swallow;Penetration/Aspiration during swallow;Penetration/Apiration after swallow;Pharyngeal residue - valleculae Pharyngeal Material enters airway, remains ABOVE vocal cords and not ejected out;Material enters airway, remains ABOVE vocal cords then ejected out Pharyngeal- Nectar Cup Delayed swallow initiation-pyriform sinuses;Penetration/Aspiration before swallow;Penetration/Aspiration during swallow;Penetration/Apiration after  swallow;Pharyngeal residue - valleculae Pharyngeal Material enters airway, remains ABOVE vocal cords and not ejected out Pharyngeal- Nectar Straw -- Pharyngeal -- Pharyngeal- Thin Teaspoon -- Pharyngeal -- Pharyngeal- Thin Cup -- Pharyngeal -- Pharyngeal- Thin Straw -- Pharyngeal -- Pharyngeal- Puree Pharyngeal residue - valleculae;Delayed swallow initiation-vallecula Pharyngeal Material does not enter airway Pharyngeal- Mechanical Soft -- Pharyngeal -- Pharyngeal- Regular -- Pharyngeal -- Pharyngeal- Multi-consistency -- Pharyngeal -- Pharyngeal- Pill -- Pharyngeal -- Pharyngeal Comment --  No flowsheet data found. DeBlois, Riley Nearing 04/07/2019, 1:22 PM              Ct Renal Stone Study  Result Date: 04/06/2019 CLINICAL DATA:  Hydronephrosis. EXAM: CT ABDOMEN AND PELVIS WITHOUT CONTRAST TECHNIQUE: Multidetector CT imaging of the abdomen and pelvis was performed following the standard protocol without IV contrast. COMPARISON:  Ultrasound of April 05, 2019. FINDINGS: Lower chest: No acute abnormality. Hepatobiliary: No focal liver abnormality is seen. No gallstones, gallbladder wall thickening, or biliary dilatation. Pancreas: Unremarkable. No pancreatic ductal dilatation or surrounding inflammatory changes. Spleen: Normal in size without focal abnormality. Adrenals/Urinary Tract: Adrenal glands appear normal. Moderate bilateral hydroureteronephrosis is noted without evidence of renal or ureteral calculi. Catheter is noted in urinary bladder which appears to be decompressed. However, there appears to be significant diffuse wall thickening of urinary bladder which may be due to lack of distension, but is concerning for cystitis. Clinical correlation is recommended. Stomach/Bowel: Stomach is within normal limits. Appendix appears normal. No evidence of bowel wall thickening, distention, or inflammatory changes. Vascular/Lymphatic: Aortic atherosclerosis. No enlarged abdominal or pelvic lymph nodes.  Reproductive: Prostate is unremarkable. Other: No abdominal wall hernia or abnormality. No abdominopelvic ascites. Musculoskeletal: No acute or significant osseous findings. IMPRESSION: Moderate bilateral  hydroureteronephrosis is noted without evidence of obstructing calculus. Diffuse wall thickening of urinary bladder is noted which may be due to lack of distension, but is concerning for cystitis. Clinical correlation is recommended. Catheter is noted within urinary bladder. Aortic Atherosclerosis (ICD10-I70.0). Electronically Signed   By: Lupita Raider M.D.   On: 04/06/2019 09:24   Ct Head Code Stroke Wo Contrast  Result Date: 04/05/2019 CLINICAL DATA:  Code stroke. Right-sided weakness. Speech disturbance. Last seen normal 0730 hours. EXAM: CT HEAD WITHOUT CONTRAST TECHNIQUE: Contiguous axial images were obtained from the base of the skull through the vertex without intravenous contrast. COMPARISON:  09/03/2018 FINDINGS: Brain: No CT evidence of acute infarction. Chronic small-vessel ischemic changes of the cerebral hemispheric white matter. No mass lesion, hemorrhage, hydrocephalus or extra-axial collection. Vascular: No definite hyperdense vessel. One could question slight hyperdensity in the left M1 segment. Skull: Negative Sinuses/Orbits: Clear/normal Other: None ASPECTS (Alberta Stroke Program Early CT Score) - Ganglionic level infarction (caudate, lentiform nuclei, internal capsule, insula, M1-M3 cortex): 6 - Supraganglionic infarction (M4-M6 cortex): 3 Total score (0-10 with 10 being normal): 9 IMPRESSION: 1. Possible early gray-white differentiation loss in the insula on the left. Chronic small-vessel ischemic changes. 2. One could question slight hyperdensity in the left M1 segment. This is not definite. 3. ASPECTS is 9 4. These results were communicated to Dr. Amada Jupiter at 10:01 amon 4/15/2020by text page via the [facility] messaging system. Electronically Signed   By: Paulina Fusi M.D.   On:  04/05/2019 10:04   Vas US Carotid  Result Date: 04/06/2019 Carotid Arterial Duplex Study Indications:  CVA. Risk Factors: Hypertension, hyperlipidemia, prior CVA. Limitations:  Patient positioning, snoring, body habitus Performing Technologist: Blanch Media RVS  Examination Guidelines: A complete evaluation includes B-mode imaging, spectral Doppler, color Doppler, and power Doppler as needed of all accessible portions of each vessel. Bilateral testing is considered an integral part of a complete examination. Limited examinations for reoccurring indications may be performed as noted.  Right Carotid Findings: +----------+--------+--------+--------+-----------+--------+             PSV cm/s EDV cm/s Stenosis Describe    Comments  +----------+--------+--------+--------+-----------+--------+  CCA Prox   80       16                homogeneous           +----------+--------+--------+--------+-----------+--------+  CCA Distal 164      24                homogeneous           +----------+--------+--------+--------+-----------+--------+  ICA Prox   188      34       1-39%    homogeneous           +----------+--------+--------+--------+-----------+--------+  ICA Distal 112      22                                      +----------+--------+--------+--------+-----------+--------+  ECA        170      11                                      +----------+--------+--------+--------+-----------+--------+ +----------+--------+-------+--------+-------------------+             PSV cm/s EDV cms Describe  Arm Pressure (mmHG)  +----------+--------+-------+--------+-------------------+  Subclavian 77                                             +----------+--------+-------+--------+-------------------+ +---------+--------+--------+--------------+  Vertebral PSV cm/s EDV cm/s Not identified  +---------+--------+--------+--------------+  Left Carotid Findings: +----------+--------+--------+--------+-----------+--------+             PSV  cm/s EDV cm/s Stenosis Describe    Comments  +----------+--------+--------+--------+-----------+--------+  CCA Prox   181      26                homogeneous           +----------+--------+--------+--------+-----------+--------+  CCA Distal 118      23                homogeneous           +----------+--------+--------+--------+-----------+--------+  ICA Prox   161      28       1-39%    homogeneous           +----------+--------+--------+--------+-----------+--------+  ICA Distal 152      30                                      +----------+--------+--------+--------+-----------+--------+  ECA        170      12                                      +----------+--------+--------+--------+-----------+--------+ +----------+--------+--------+--------+-------------------+  Subclavian PSV cm/s EDV cm/s Describe Arm Pressure (mmHG)  +----------+--------+--------+--------+-------------------+             121                                             +----------+--------+--------+--------+-------------------+ +---------+--------+--+--------+--+---------+  Vertebral PSV cm/s 52 EDV cm/s 15 Antegrade  +---------+--------+--+--------+--+---------+    Preliminary      CBC Recent Labs  Lab 04/05/19 0946 04/06/19 1337 04/06/19 2019 04/07/19 0906 04/08/19 0357  WBC 8.8 8.3 10.1 14.0* 10.7*  HGB 10.5* 7.8* 9.9* 9.4* 9.5*  HCT 36.3* 24.4* 31.6* 30.4* 30.6*  PLT 179 179 182 164 150  MCV 97.8 88.7 88.3 88.6 90.5  MCH 28.3 28.4 27.7 27.4 28.1  MCHC 28.9* 32.0 31.3 30.9 31.0  RDW 13.4 13.2 13.2 13.1 13.3  LYMPHSABS 1.2  --   --   --   --   MONOABS 0.7  --   --   --   --   EOSABS 0.1  --   --   --   --   BASOSABS 0.0  --   --   --   --     Chemistries  Recent Labs  Lab 04/05/19 0946  04/06/19 0818 04/06/19 1337 04/06/19 2019 04/07/19 0906 04/08/19 0357  NA 138   < > 141 143 143 145 147*  K 6.8*   < > 5.7* 5.4* 5.5* 5.0 4.8  CL 112*   < > 106 105 103 98 96*  CO2 10*   < > 18* 20* 24 32 33*  GLUCOSE  115*   < > 94 97 109* 112* 103*  BUN 158*   < > 147* 144* 138* 129* 127*  CREATININE 10.48*   < > 8.83* 8.54* 8.05* 7.33* 7.04*  CALCIUM 9.1   < > 8.2* 8.3* 8.2* 7.9* 8.1*  MG  --   --   --   --   --   --  1.5*  AST 43*  --   --   --   --   --   --   ALT 24  --   --   --   --   --   --   ALKPHOS 133*  --   --   --   --   --   --   BILITOT 0.6  --   --   --   --   --   --    < > = values in this interval not displayed.   ------------------------------------------------------------------------------------------------------------------ Recent Labs    04/06/19 0015  CHOL 160  HDL 19*  LDLCALC 110*  TRIG 154*  CHOLHDL 8.4    Lab Results  Component Value Date   HGBA1C 6.0 (H) 04/06/2019   ------------------------------------------------------------------------------------------------------------------ No results for input(s): TSH, T4TOTAL, T3FREE, THYROIDAB in the last 72 hours.  Invalid input(s): FREET3 ------------------------------------------------------------------------------------------------------------------ No results for input(s): VITAMINB12, FOLATE, FERRITIN, TIBC, IRON, RETICCTPCT in the last 72 hours.  Coagulation profile Recent Labs  Lab 04/05/19 0946  INR 1.1    No results for input(s): DDIMER in the last 72 hours.  Cardiac Enzymes Recent Labs  Lab 04/06/19 0015 04/06/19 0855 04/06/19 2019  TROPONINI 13.96* 17.34* 9.60*   ------------------------------------------------------------------------------------------------------------------ No results found for: BNP   Shon Hale M.D on 04/08/2019 at 3:49 PM  Go to [email or web address] - for contact info  [facility] - Office  [phone]

## 2019-04-08 NOTE — Progress Notes (Signed)
Tele reported pt's HR un-sustained in 160s. Dr. Marisa Severin notified. Nurse will continue to monitor. Tyron Russell Kyrah Schiro

## 2019-04-09 LAB — BASIC METABOLIC PANEL
Anion gap: 14 (ref 5–15)
BUN: 117 mg/dL — ABNORMAL HIGH (ref 8–23)
CO2: 32 mmol/L (ref 22–32)
Calcium: 8.1 mg/dL — ABNORMAL LOW (ref 8.9–10.3)
Chloride: 96 mmol/L — ABNORMAL LOW (ref 98–111)
Creatinine, Ser: 6.01 mg/dL — ABNORMAL HIGH (ref 0.61–1.24)
GFR calc Af Amer: 10 mL/min — ABNORMAL LOW (ref >60)
GFR calc non Af Amer: 9 mL/min — ABNORMAL LOW (ref >60)
Glucose, Bld: 154 mg/dL — ABNORMAL HIGH (ref 70–99)
Potassium: 4.3 mmol/L (ref 3.5–5.1)
Sodium: 142 mmol/L (ref 135–145)

## 2019-04-09 MED ORDER — METOPROLOL TARTRATE 25 MG PO TABS
25.0000 mg | ORAL_TABLET | Freq: Three times a day (TID) | ORAL | Status: DC
  Administered 2019-04-09 – 2019-04-10 (×4): 25 mg via ORAL
  Filled 2019-04-09 (×4): qty 1

## 2019-04-09 NOTE — Progress Notes (Signed)
Physical Therapy Treatment Patient Details Name: Gregory [name] MRN: [id number] DOB: Mar 22, 1951 Today's Date: 04/09/2019    History of Present Illness Pt is a 68 y/o male admitted secondary to slurred speech and R sided weakness. 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  CT angiogram does show left carotid stenosis.  Review of patient's chart shows that he had cerebral catheter angiogram in September 2019 which showed only 50% extracranial carotid stenosis on either side.  Patient has been found to have hydronephrosis, renal failure as well as NSTEMI. Per cardiology, pt is not a candidate for any invasive evaluation.  He will require medical treatment/supportive care. PMH including but not limited to CKD, HTN and HLD.    PT Comments    Pt progressing slowly with mobility, tolerated sitting EOB >15 mins today. Required mod/ max A +2 for bed mobility and attempt at standing. Was not able to achieve full stand and required blocking of B knees and feet. Pt attempting to verbalize end of session and followed some commands when visual cues given. PT will continue to follow.    Follow Up Recommendations  CIR     Equipment Recommendations  None recommended by PT    Recommendations for Other Services Rehab consult     Precautions / Restrictions Precautions Precautions: Fall Restrictions Weight Bearing Restrictions: No    Mobility  Bed Mobility Overal bed mobility: Needs Assistance Bed Mobility: Supine to Sit;Sit to Supine     Supine to sit: Mod assist;HOB elevated;+2 for physical assistance Sit to supine: Mod assist;+2 for physical assistance   General bed mobility comments: mod A at LE's and trunk to rise as well as at hips to scoot to EOB. However, when pt wanted to return to supine, he initiated scooting L and back into bed on his own. Still needed mod A for LE's into bed  Transfers Overall transfer level: Needs assistance Equipment used: 2 person hand held  assist Transfers: Sit to/from Stand Sit to Stand: Max assist;+2 physical assistance         General transfer comment: B knees and feet blocked and max A +2 given but pt achieved only partial stand with legs braced against the bed and posterior lean  Ambulation/Gait             General Gait Details: unable   Stairs             Wheelchair Mobility    Modified Rankin (Stroke Patients Only) Modified Rankin (Stroke Patients Only) Pre-Morbid Rankin Score: No symptoms Modified Rankin: Severe disability     Balance Overall balance assessment: Needs assistance Sitting-balance support: Feet supported;Single extremity supported Sitting balance-Leahy Scale: Fair     Standing balance support: Bilateral upper extremity supported Standing balance-Leahy Scale: Zero                              Cognition Arousal/Alertness: Lethargic Behavior During Therapy: Flat affect Overall Cognitive Status: Impaired/Different from baseline Area of Impairment: Following commands;Problem solving                       Following Commands: Follows one step commands inconsistently;Follows one step commands with increased time     Problem Solving: Slow processing;Decreased initiation;Difficulty sequencing;Requires tactile cues General Comments: pt does not respond well to verbal cues, does better with tactile cues and demonstration      Exercises      General  Comments General comments (skin integrity, edema, etc.): Pt with heavy breathing with exertion. HR 90's to 117 bpm. SpO2 mid 90's throughout on RA. BP 105/66 after session. Pt able to get "T" out asking for TV to be turned on at end of session.       Pertinent Vitals/Pain Pain Assessment: Faces Faces Pain Scale: Hurts little more Pain Location: R foot to touch Pain Descriptors / Indicators: Grimacing Pain Intervention(s): Limited activity within patient's tolerance;Monitored during session    Home  Living                      Prior Function            PT Goals (current goals can now be found in the care plan section) Acute Rehab PT Goals Patient Stated Goal: unable to state PT Goal Formulation: Patient unable to participate in goal setting Time For Goal Achievement: 04/20/19 Potential to Achieve Goals: Fair Progress towards PT goals: Progressing toward goals    Frequency    Min 4X/week      PT Plan Current plan remains appropriate    Co-evaluation PT/OT/SLP Co-Evaluation/Treatment: Yes Reason for Co-Treatment: Complexity of the patient's impairments (multi-system involvement);Necessary to address cognition/behavior during functional activity;For patient/therapist safety;To address functional/ADL transfers PT goals addressed during session: Mobility/safety with mobility;Balance        AM-PAC PT "6 Clicks" Mobility   Outcome Measure  Help needed turning from your back to your side while in a flat bed without using bedrails?: A Lot Help needed moving from lying on your back to sitting on the side of a flat bed without using bedrails?: A Lot Help needed moving to and from a bed to a chair (including a wheelchair)?: Total Help needed standing up from a chair using your arms (e.g., wheelchair or bedside chair)?: Total Help needed to walk in hospital room?: Total Help needed climbing 3-5 steps with a railing? : Total 6 Click Score: 8    End of Session   Activity Tolerance: Patient limited by fatigue Patient left: in bed;with bed alarm set;with call bell/phone within reach Nurse Communication: Mobility status PT Visit Diagnosis: Other abnormalities of gait and mobility (R26.89);Hemiplegia and hemiparesis Hemiplegia - Right/Left: Right Hemiplegia - dominant/non-dominant: Non-dominant Hemiplegia - caused by: Cerebral infarction     Time: [id number] PT Time Calculation (min) (ACUTE ONLY): 24 min  Charges:  $Neuromuscular Re-education: 8-22 mins                      Lyanne CoVictoria Christiann Hagerty, PT  Acute Rehab Services  Pager [phone] Office [phone]    Lawana ChambersVictoria L Yocelin Vanlue 04/09/2019, 4:34 PM

## 2019-04-09 NOTE — Progress Notes (Signed)
Patient Demographics:    Gregory [name], is a 67 y.o. male, DOB - 1951-08-09, VFI:[id number]  Admit date - 04/05/2019   Admitting Physician Costin Otelia Sergeant, MD  Outpatient Primary MD for the patient is System, Pcp Not In  LOS - 4   No chief complaint on file.       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 Joya San without hemodynamically significant stenosis, status post loop recorder implantation for possible A. fib by Dr. Ladona Ridgel on 04/07/2019, LDL is 110, A1c is 6.0, MRA head and neck without large vessel occlusion. PTA patient was on aspirin and Plavix, now on aspirin 325 mg daily, Lipitor 80 mg daily, neurologist recommends discharge back on aspirin and Plavix.  A1c 6.0  2) NSTEMI with peak troponin I of 17.34--- echo with EF of 55 to 60%, there was input appreciated, continue metoprolol and Lipitor , currently chest pain-free , per cardiologist no invasive work-up planned continue medical management  3)Multifocal atrial tachycardia-cardiology input appreciated, change  metoprolol to 25 mg p.o. 3 times daily as BP allows  4) Acute on CKD III with obstructive uropathy.  Creatinine is down to 6.0 (creatinine peaked at 10.48, BUN peaked at 158).  Baseline creatinine 1.6 to 1.7.  patient has bilateral hydronephrosis due to chronic bladder outlet obstruction, nephrology input appreciated continue dextrose IV per nephrology team   5)FEN/hypernatremia --- improving, sodium is down to 142 , patient with free water deficit, speech pathologist consult appreciated, recommend Dysphagia 1 (Puree) solids Liquid Administration via Spoon Medication Administration Crushed with puree.... Continue iv Dextrose solution as per nephrology team  6)HTN--initially permissive hypertension was allowed, may shoot for normotensive blood pressure around 04/10/2019 , change metoprolol to 25 mg p.o. 3 times daily and continue Procardia XL , use prn IV hydralazine for elevated BP  7)Obesity with OSA--- continue CPAP nightly  8)BPH with LUTs--- patient has bilateral hydronephrosis due to chronic bladder outlet obstruction continue Foley for 3 to 4 weeks and follow-up with urologist  9)Hematuria and Possible UTI--- currently on iv Rocephin, it appears that the urine culture was not sent, may discontinue IV Rocephin after treatment for 5 days , Foley remains in place  10)expressive aphasia: Evaluated by speech therapist and started on dysphagia 1 diet/honey thick liquids.  Disposition/Need for in-Hospital Stay- patient unable to be discharged at this time due to awaiting possible transfer to inpatient rehab  Code Status :  full  Family Communication:   na   Disposition Plan  :  InPatient rehab  Consults  : Neurology/urology/cardiology//nephrology  DVT Prophylaxis  :    SCDs  Lab Results  Component Value Date   PLT 150 04/08/2019    Inpatient Medications  Scheduled Meds:  aspirin  300 mg Rectal Daily   Or   aspirin  325 mg Oral Daily   atorvastatin  80 mg Oral q1800    metoprolol tartrate  25 mg Oral TID   NIFEdipine  30 mg Oral Daily   Continuous Infusions:  cefTRIAXone (ROCEPHIN)  IV Stopped (04/08/19 1147)   dextrose 100 mL/hr at 04/08/19 2347   PRN Meds:.acetaminophen **OR** acetaminophen (TYLENOL) oral liquid 160 mg/5 mL **OR** acetaminophen, acetaminophen, hydrALAZINE, ondansetron (ZOFRAN) IV, Resource ThickenUp Clear    Anti-infectives (From admission, onward)   Start     Dose/Rate Route Frequency Ordered Stop   04/06/19 1145  cefTRIAXone (ROCEPHIN) 1 g in sodium chloride 0.9 % 100 mL IVPB     1 g 200 mL/hr over 30 Minutes Intravenous Every 24 hours 04/06/19 1140          Objective:   Vitals:   04/08/19 1959 04/09/19 0000 04/09/19 0400 04/09/19 0724  BP: 106/78 111/83 118/70 127/73  Pulse: 87 74 84 61  Resp: 19 12 16 19   Temp: 97.7 F (36.5 C) 97.9 F (36.6 C) 98.3 F (36.8 C) 98 F (36.7 C)  TempSrc: Oral Oral Oral Axillary  SpO2: 96% 94% 98% 90%  Weight:        Wt Readings from Last 3 Encounters:  04/05/19 88.8 kg  09/09/18 99.8 kg  09/04/18 106.8 kg     Intake/Output Summary (Last 24 hours) at 04/09/2019 1107 Last data filed at 04/08/2019 2100 Gross per 24 hour  Intake 542.02 ml  Output 950 ml  Net -407.98 ml     Physical Exam Patient is examined daily including today on 04/09/19 , exams remain the same as of yesterday except that has changed   Gen:- Awake Alert,  , no acute distress HEENT:- [place].AT, No sclera icterus Neck-Supple Neck,No JVD,.  Lungs-  CTAB , fair symmetrical air movement CV- S1, S2 normal, irregular,  abd-  +ve B.Sounds, Abd Soft, No tenderness,    Extremity/Skin:- No  edema, pedal pulses present  Psych-affect is appropriate, oriented x3 Neuro-aphasia and right-sided hemiplegia GU--Foley catheter in situ  Data Review:   Micro Results No results found for this or any previous visit (from the past 240 hour(s)).  Radiology Reports [name] ZO Contrast  Result Date:  04/05/2019 CLINICAL DATA:  Acute presentation with right-sided weakness and speech disturbance. EXAM: MRI HEAD WITHOUT CONTRAST MRA HEAD WITHOUT CONTRAST MRA NECK WITHOUT CONTRAST TECHNIQUE: Multiplanar, multiecho pulse sequences of the brain and surrounding structures were obtained without intravenous contrast. Angiographic images of the Circle of Willis were obtained using MRA technique without intravenous contrast. Angiographic images of the neck were obtained using MRA technique without intravenous contrast. Carotid stenosis measurements (when applicable) are obtained utilizing NASCET criteria, using the distal internal carotid diameter as the denominator. COMPARISON:  Head CT same day.  MRI 09/03/2018. FINDINGS: MRI HEAD FINDINGS Brain: Diffusion imaging confirms acute infarction in the left insula and posterior frontal region. The area of involvement measures about 3.5 cm in size. No mass effect or visible hemorrhage. No hydrocephalus. Chronic small-vessel ischemic changes of the deep white matter seen elsewhere. Vascular: Major vessels at the base of the brain show flow.  Skull and upper cervical spine: Negative Sinuses/Orbits: Clear/normal Other: None MRA HEAD FINDINGS Both internal carotid arteries are patent through the skull base and siphon regions with atherosclerotic narrowing and irregularity in the siphon regions. Supraclinoid internal carotid arteries are widely patent. The anterior and middle cerebral vessels appear to show flow without visible large or medium vessel occlusion. Distal branch vessels do show some atherosclerotic irregularity. The left vertebral artery supplies the basilar. No antegrade flow seen in the right vertebral artery. The basilar artery shows atherosclerotic narrowing and irregularity diffusely. Posterior circulation branch vessels are patent, with both posterior cerebral arteries receiving most of there supply from the anterior circulation. There is long segment stenosis of  the left P1 segment, but good flow seen distal to that. MRA NECK FINDINGS There is antegrade flow in both common carotid arteries. The study suffers from pronounced motion degradation. Antegrade flow is seen in both cervical internal carotid arteries, but accurate evaluation of the carotid bifurcations is not possible. There is antegrade flow in the left vertebral artery but none seen on the right. IMPRESSION: 3.5 cm region of acute infarction affecting the left insula and posterior frontal region. No evidence of gross hemorrhage. MR angiography of the neck markedly degraded by motion. Both internal carotid arteries show antegrade flow. No antegrade flow in the right vertebral artery. Intracranial MR angiography does not show any large or medium vessel occlusion. Widespread atherosclerotic irregularity of the more distal branch vessels. Electronically Signed   By: Paulina Fusi M.D.   On: 04/05/2019 10:59   [name] Neck Wo Contrast  Result Date: 04/05/2019 CLINICAL DATA:  Acute presentation with right-sided weakness and speech disturbance. EXAM: MRI HEAD WITHOUT CONTRAST MRA HEAD WITHOUT CONTRAST MRA NECK WITHOUT CONTRAST TECHNIQUE: Multiplanar, multiecho pulse sequences of the brain and surrounding structures were obtained without intravenous contrast. Angiographic images of the Circle of Willis were obtained using MRA technique without intravenous contrast. Angiographic images of the neck were obtained using MRA technique without intravenous contrast. Carotid stenosis measurements (when applicable) are obtained utilizing NASCET criteria, using the distal internal carotid diameter as the denominator. COMPARISON:  Head CT same day.  MRI 09/03/2018. FINDINGS: MRI HEAD FINDINGS Brain: Diffusion imaging confirms acute infarction in the left insula and posterior frontal region. The area of involvement measures about 3.5 cm in size. No mass effect or visible hemorrhage. No hydrocephalus. Chronic small-vessel ischemic  changes of the deep white matter seen elsewhere. Vascular: Major vessels at the base of the brain show flow. Skull and upper cervical spine: Negative Sinuses/Orbits: Clear/normal Other: None MRA HEAD FINDINGS Both internal carotid arteries are patent through the skull base and siphon regions with atherosclerotic narrowing and irregularity in the siphon regions. Supraclinoid internal carotid arteries are widely patent. The anterior and middle cerebral vessels appear to show flow without visible large or medium vessel occlusion. Distal branch vessels do show some atherosclerotic irregularity. The left vertebral artery supplies the basilar. No antegrade flow seen in the right vertebral artery. The basilar artery shows atherosclerotic narrowing and irregularity diffusely. Posterior circulation branch vessels are patent, with both posterior cerebral arteries receiving most of there supply from the anterior circulation. There is long segment stenosis of the left P1 segment, but good flow seen distal to that. MRA NECK FINDINGS There is antegrade flow in both common carotid arteries. The study suffers from pronounced motion degradation. Antegrade flow is seen in both cervical internal carotid arteries, but accurate evaluation of the carotid bifurcations is not possible. There is antegrade  flow in the left vertebral artery but none seen on the right. IMPRESSION: 3.5 cm region of acute infarction affecting the left insula and posterior frontal region. No evidence of gross hemorrhage. MR angiography of the neck markedly degraded by motion. Both internal carotid arteries show antegrade flow. No antegrade flow in the right vertebral artery. Intracranial MR angiography does not show any large or medium vessel occlusion. Widespread atherosclerotic irregularity of the more distal branch vessels. Electronically Signed   By: Paulina Fusi M.D.   On: 04/05/2019 10:59   Mr Brain Wo Contrast  Result Date: 04/05/2019 CLINICAL DATA:   Acute presentation with right-sided weakness and speech disturbance. EXAM: MRI HEAD WITHOUT CONTRAST MRA HEAD WITHOUT CONTRAST MRA NECK WITHOUT CONTRAST TECHNIQUE: Multiplanar, multiecho pulse sequences of the brain and surrounding structures were obtained without intravenous contrast. Angiographic images of the Circle of Willis were obtained using MRA technique without intravenous contrast. Angiographic images of the neck were obtained using MRA technique without intravenous contrast. Carotid stenosis measurements (when applicable) are obtained utilizing NASCET criteria, using the distal internal carotid diameter as the denominator. COMPARISON:  Head CT same day.  MRI 09/03/2018. FINDINGS: MRI HEAD FINDINGS Brain: Diffusion imaging confirms acute infarction in the left insula and posterior frontal region. The area of involvement measures about 3.5 cm in size. No mass effect or visible hemorrhage. No hydrocephalus. Chronic small-vessel ischemic changes of the deep white matter seen elsewhere. Vascular: Major vessels at the base of the brain show flow. Skull and upper cervical spine: Negative Sinuses/Orbits: Clear/normal Other: None MRA HEAD FINDINGS Both internal carotid arteries are patent through the skull base and siphon regions with atherosclerotic narrowing and irregularity in the siphon regions. Supraclinoid internal carotid arteries are widely patent. The anterior and middle cerebral vessels appear to show flow without visible large or medium vessel occlusion. Distal branch vessels do show some atherosclerotic irregularity. The left vertebral artery supplies the basilar. No antegrade flow seen in the right vertebral artery. The basilar artery shows atherosclerotic narrowing and irregularity diffusely. Posterior circulation branch vessels are patent, with both posterior cerebral arteries receiving most of there supply from the anterior circulation. There is long segment stenosis of the left P1 segment, but  good flow seen distal to that. MRA NECK FINDINGS There is antegrade flow in both common carotid arteries. The study suffers from pronounced motion degradation. Antegrade flow is seen in both cervical internal carotid arteries, but accurate evaluation of the carotid bifurcations is not possible. There is antegrade flow in the left vertebral artery but none seen on the right. IMPRESSION: 3.5 cm region of acute infarction affecting the left insula and posterior frontal region. No evidence of gross hemorrhage. MR angiography of the neck markedly degraded by motion. Both internal carotid arteries show antegrade flow. No antegrade flow in the right vertebral artery. Intracranial MR angiography does not show any large or medium vessel occlusion. Widespread atherosclerotic irregularity of the more distal branch vessels. Electronically Signed   By: Paulina Fusi M.D.   On: 04/05/2019 10:59   US Renal  Result Date: 04/05/2019 CLINICAL DATA:  67 year old male with hypertension EXAM: RENAL / URINARY TRACT ULTRASOUND COMPLETE COMPARISON:  None. FINDINGS: Right Kidney: Length: 11.1 cm x 5.3 cm x 6.1 cm, 189 cc. Hydronephrosis. Flow confirmed in the hilum of the right kidney. Left Kidney: Length: 10.9 cm x 5.6 cm x 5.9 cm, 187 cc. Hydronephrosis. Flow confirmed in the hilum of the left kidney. Anechoic cystic structure on the inferior cortex measures 2.6  cm x 1.7 cm with through transmission and no internal color flow. Additional cystic lesion measures 1.1 cm x 1.1 cm x 1.0 cm, with through transmission and no internal flow. Bladder: Catheter in place within the bladder. IMPRESSION: Bilateral hydronephrosis. If there is concern for distal obstruction, CT may be indicated. Left-sided renal cysts. Electronically Signed   By: Gilmer Mor D.O.   On: 04/05/2019 15:39   Dg Chest Port 1 View  Result Date: 04/05/2019 CLINICAL DATA:  Altered behavior. EXAM: PORTABLE CHEST 1 VIEW COMPARISON:  None. FINDINGS: Lungs are adequately  inflated and otherwise clear. Borderline cardiomegaly. Mediastinum is within normal. Fusion hardware over the lower cervical spine intact. Minimal degenerative change of the spine. IMPRESSION: No acute findings. Electronically Signed   By: Elberta Fortis M.D.   On: 04/05/2019 11:32   Dg Swallowing Func-speech Pathology  Result Date: 04/07/2019 Objective Swallowing Evaluation: Type of Study: MBS-Modified Barium Swallow Study  Patient Details Name: Tiler Brandis MRN: [id number] Date of Birth: Dec 10, 1951 Today's Date: 04/07/2019 Time: SLP Start Time (ACUTE ONLY): 1140 -SLP Stop Time (ACUTE ONLY): 1200 SLP Time Calculation (min) (ACUTE ONLY): 20 min Past Medical History: Past Medical History: Diagnosis Date  AKI (acute kidney injury) (HCC)   CKD (chronic kidney disease) stage 2, GFR 60-89 ml/min   CVA (cerebral vascular accident) (HCC)   Hyperlipidemia   Hypertension   PVD (peripheral vascular disease) (HCC)  Past Surgical History: Past Surgical History: Procedure Laterality Date  AORTIC ARCH ANGIOGRAPHY N/A 09/09/2018  Procedure: AORTIC ARCH ANGIOGRAPHY;  Surgeon: Sherren Kerns, MD;  Location: [facility] IN[facility]ASIVE [facility];  Service: Cardiovascular;  Laterality: N/A;  CAROTID ANGIOGRAPHY Bilateral 09/09/2018  Procedure: CAROTID ANGIOGRAPHY;  Surgeon: Sherren Kerns, MD;  Location: [facility] INVASIVE CV LAB;  Service: Cardiovascular;  Laterality: Bilateral; HPI: Pt is a 68 y.o. male with medical history significant of chronic kidney disease stage III, hypertension, hyperlipidemia, who presented to the hospital with chief complaint of sudden onset right-sided weakness and inability to talk.  MRI of the brain revealed 3.5 cm region of acute infarction affecting the left insula and posterior frontal region.  No data recorded Assessment / Plan / Recommendation CHL IP CLINICAL IMPRESSIONS 04/07/2019 Clinical Impression Pt presents with a severe oral dysphagia with CN VII and XII weakness impacting labial seal and lingual  manipulation of the bolus. There is severe anterior spillage and pooling the right buccal cavity. Pt needs increased time and posterior head tilt to gather and orally transit boluses via slight pumping and passive spillage.  Palatal elevation is weak, does not occur until swallow triggered. There is a plate on the cervical spine starting at C3 causing protrusion of the posterior pharyngeal wall and deflecting delayed nectar liquids into the vestibule. Due to pts body habitus it is very difficult to see the glottis and subglottis. Penetration is visible, strongly suspect airway protection, but cannot confirm. No cough was elicited, but pt could attempt a throat clear. Best textures are honey thick liquids via spoon and puree. Oral suction, second swallows and intermittent throat clearing will be beneficial to clear oral and base of tongue residue post swallow. Hope for increasing automaticity and strength with diet. Meds will need to be crushed.   SLP Visit Diagnosis Dysphagia, oropharyngeal phase (R13.12) Attention and concentration deficit following -- Frontal lobe and executive function deficit following -- Impact on safety and function Severe aspiration risk   CHL IP TREATMENT RECOMMENDATION 04/07/2019 Treatment Recommendations Therapy as outlined in treatment plan below  No flowsheet data found. CHL IP DIET RECOMMENDATION 04/07/2019 SLP Diet Recommendations Dysphagia 1 (Puree) solids Liquid Administration via Spoon Medication Administration Crushed with puree Compensations Slow rate;Small sips/bites;Monitor for anterior loss;Lingual sweep for clearance of pocketing;Multiple dry swallows after each bite/sip;Clear throat intermittently Postural Changes Seated upright at 90 degrees   CHL IP OTHER RECOMMENDATIONS 04/07/2019 Recommended Consults -- Oral Care Recommendations -- Other Recommendations Have oral suction available   CHL IP FOLLOW UP RECOMMENDATIONS 04/07/2019 Follow up Recommendations Inpatient Rehab   CHL  IP FREQUENCY AND DURATION 04/07/2019 Speech Therapy Frequency (ACUTE ONLY) min 2x/week Treatment Duration 2 weeks      CHL IP ORAL PHASE 04/07/2019 Oral Phase Impaired Oral - Pudding Teaspoon -- Oral - Pudding Cup -- Oral - Honey Teaspoon Decreased bolus cohesion;Premature spillage;Delayed oral transit;Lingual/palatal residue;Right pocketing in lateral sulci;Reduced posterior propulsion;Lingual pumping;Weak lingual manipulation;Right anterior bolus loss Oral - Honey Cup -- Oral - Nectar Teaspoon Decreased bolus cohesion;Premature spillage;Delayed oral transit;Lingual/palatal residue;Right pocketing in lateral sulci;Reduced posterior propulsion;Lingual pumping;Weak lingual manipulation;Right anterior bolus loss Oral - Nectar Cup Decreased bolus cohesion;Premature spillage;Delayed oral transit;Lingual/palatal residue;Right pocketing in lateral sulci;Reduced posterior propulsion;Lingual pumping;Weak lingual manipulation;Right anterior bolus loss Oral - Nectar Straw -- Oral - Thin Teaspoon -- Oral - Thin Cup -- Oral - Thin Straw -- Oral - Puree Decreased bolus cohesion;Premature spillage;Delayed oral transit;Lingual/palatal residue;Right pocketing in lateral sulci;Reduced posterior propulsion;Lingual pumping;Weak lingual manipulation;Right anterior bolus loss Oral - Mech Soft -- Oral - Regular -- Oral - Multi-Consistency -- Oral - Pill -- Oral Phase - Comment --  CHL IP PHARYNGEAL PHASE 04/07/2019 Pharyngeal Phase Impaired Pharyngeal- Pudding Teaspoon -- Pharyngeal -- Pharyngeal- Pudding Cup -- Pharyngeal -- Pharyngeal- Honey Teaspoon Delayed swallow initiation-vallecula;Pharyngeal residue - valleculae Pharyngeal -- Pharyngeal- Honey Cup -- Pharyngeal -- Pharyngeal- Nectar Teaspoon Delayed swallow initiation-pyriform sinuses;Penetration/Aspiration before swallow;Penetration/Aspiration during swallow;Penetration/Apiration after swallow;Pharyngeal residue - valleculae Pharyngeal Material enters airway, remains ABOVE vocal  cords and not ejected out;Material enters airway, remains ABOVE vocal cords then ejected out Pharyngeal- Nectar Cup Delayed swallow initiation-pyriform sinuses;Penetration/Aspiration before swallow;Penetration/Aspiration during swallow;Penetration/Apiration after swallow;Pharyngeal residue - valleculae Pharyngeal Material enters airway, remains ABOVE vocal cords and not ejected out Pharyngeal- Nectar Straw -- Pharyngeal -- Pharyngeal- Thin Teaspoon -- Pharyngeal -- Pharyngeal- Thin Cup -- Pharyngeal -- Pharyngeal- Thin Straw -- Pharyngeal -- Pharyngeal- Puree Pharyngeal residue - valleculae;Delayed swallow initiation-vallecula Pharyngeal Material does not enter airway Pharyngeal- Mechanical Soft -- Pharyngeal -- Pharyngeal- Regular -- Pharyngeal -- Pharyngeal- Multi-consistency -- Pharyngeal -- Pharyngeal- Pill -- Pharyngeal -- Pharyngeal Comment --  No flowsheet data found. De[place], Riley Nearing 04/07/2019, 1:22 PM              Ct Renal Stone Study  Result Date: 04/06/2019 CLINICAL DATA:  Hydronephrosis. EXAM: CT ABDOMEN AND PELVIS WITHOUT CONTRAST TECHNIQUE: Multidetector CT imaging of the abdomen and pelvis was performed following the standard protocol without IV contrast. COMPARISON:  Ultrasound of April 05, 2019. FINDINGS: Lower chest: No acute abnormality. Hepatobiliary: No focal liver abnormality is seen. No gallstones, gallbladder wall thickening, or biliary dilatation. Pancreas: Unremarkable. No pancreatic ductal dilatation or surrounding inflammatory changes. Spleen: Normal in size without focal abnormality. Adrenals/Urinary Tract: Adrenal glands appear normal. Moderate bilateral hydroureteronephrosis is noted without evidence of renal or ureteral calculi. Catheter is noted in urinary bladder which appears to be decompressed. However, there appears to be significant diffuse wall thickening of urinary bladder which may be due to lack of distension, but is concerning for cystitis. Clinical correlation  is recommended. Stomach/Bowel: Stomach is within  normal limits. Appendix appears normal. No evidence of bowel wall thickening, distention, or inflammatory changes. Vascular/Lymphatic: Aortic atherosclerosis. No enlarged abdominal or pelvic lymph nodes. Reproductive: Prostate is unremarkable. Other: No abdominal wall hernia or abnormality. No abdominopelvic ascites. Musculoskeletal: No acute or significant osseous findings. IMPRESSION: Moderate bilateral hydroureteronephrosis is noted without evidence of obstructing calculus. Diffuse wall thickening of urinary bladder is noted which may be due to lack of distension, but is concerning for cystitis. Clinical correlation is recommended. Catheter is noted within urinary bladder. Aortic Atherosclerosis (ICD10-I70.0). Electronically Signed   By: Lupita Raider M.D.   On: 04/06/2019 09:24   Ct Head Code Stroke Wo Contrast  Result Date: 04/05/2019 CLINICAL DATA:  Code stroke. Right-sided weakness. Speech disturbance. Last seen normal 0730 hours. EXAM: CT HEAD WITHOUT CONTRAST TECHNIQUE: Contiguous axial images were obtained from the base of the skull through the vertex without intravenous contrast. COMPARISON:  09/03/2018 FINDINGS: Brain: No CT evidence of acute infarction. Chronic small-vessel ischemic changes of the cerebral hemispheric white matter. No mass lesion, hemorrhage, hydrocephalus or extra-axial collection. Vascular: No definite hyperdense vessel. One could question slight hyperdensity in the left M1 segment. Skull: Negative Sinuses/Orbits: Clear/normal Other: None ASPECTS (Alberta Stroke Program Early CT Score) - Ganglionic level infarction (caudate, lentiform nuclei, internal capsule, insula, M1-M3 cortex): 6 - Supraganglionic infarction (M4-M6 cortex): 3 Total score (0-10 with 10 being normal): 9 IMPRESSION: 1. Possible early gray-white differentiation loss in the insula on the left. Chronic small-vessel ischemic changes. 2. One could question slight  hyperdensity in the left M1 segment. This is not definite. 3. ASPECTS is 9 4. These results were communicated to Dr. Amada Jupiter at 10:01 amon 4/15/2020by text page via the [facility] messaging system. Electronically Signed   By: Paulina Fusi M.D.   On: 04/05/2019 10:04   Vas US Carotid  Result Date: 04/06/2019 Carotid Arterial Duplex Study Indications:  CVA. Risk Factors: Hypertension, hyperlipidemia, prior CVA. Limitations:  Patient positioning, snoring, body habitus Performing Technologist: Blanch Media RVS  Examination Guidelines: A complete evaluation includes B-mode imaging, spectral Doppler, color Doppler, and power Doppler as needed of all accessible portions of each vessel. Bilateral testing is considered an integral part of a complete examination. Limited examinations for reoccurring indications may be performed as noted.  Right Carotid Findings: +----------+--------+--------+--------+-----------+--------+             PSV cm/s EDV cm/s Stenosis Describe    Comments  +----------+--------+--------+--------+-----------+--------+  CCA Prox   80       16                homogeneous           +----------+--------+--------+--------+-----------+--------+  CCA Distal 164      24                homogeneous           +----------+--------+--------+--------+-----------+--------+  ICA Prox   188      34       1-39%    homogeneous           +----------+--------+--------+--------+-----------+--------+  ICA Distal 112      22                                      +----------+--------+--------+--------+-----------+--------+  ECA        170      11                                      +----------+--------+--------+--------+-----------+--------+ +----------+--------+-------+--------+-------------------+  PSV cm/s EDV cms Describe Arm Pressure (mmHG)  +----------+--------+-------+--------+-------------------+  Subclavian 77                                              +----------+--------+-------+--------+-------------------+ +---------+--------+--------+--------------+  Vertebral PSV cm/s EDV cm/s Not identified  +---------+--------+--------+--------------+  Left Carotid Findings: +----------+--------+--------+--------+-----------+--------+             PSV cm/s EDV cm/s Stenosis Describe    Comments  +----------+--------+--------+--------+-----------+--------+  CCA Prox   181      26                homogeneous           +----------+--------+--------+--------+-----------+--------+  CCA Distal 118      23                homogeneous           +----------+--------+--------+--------+-----------+--------+  ICA Prox   161      28       1-39%    homogeneous           +----------+--------+--------+--------+-----------+--------+  ICA Distal 152      30                                      +----------+--------+--------+--------+-----------+--------+  ECA        170      12                                      +----------+--------+--------+--------+-----------+--------+ +----------+--------+--------+--------+-------------------+  Subclavian PSV cm/s EDV cm/s Describe Arm Pressure (mmHG)  +----------+--------+--------+--------+-------------------+             121                                             +----------+--------+--------+--------+-------------------+ +---------+--------+--+--------+--+---------+  Vertebral PSV cm/s 52 EDV cm/s 15 Antegrade  +---------+--------+--+--------+--+---------+    Preliminary      CBC Recent Labs  Lab 04/05/19 0946 04/06/19 1337 04/06/19 2019 04/07/19 0906 04/08/19 0357  WBC 8.8 8.3 10.1 14.0* 10.7*  HGB 10.5* 7.8* 9.9* 9.4* 9.5*  HCT 36.3* 24.4* 31.6* 30.4* 30.6*  PLT 179 179 182 164 150  MCV 97.8 88.7 88.3 88.6 90.5  MCH 28.3 28.4 27.7 27.4 28.1  MCHC 28.9* 32.0 31.3 30.9 31.0  RDW 13.4 13.2 13.2 13.1 13.3  LYMPHSABS 1.2  --   --   --   --   MONOABS 0.7  --   --   --   --   EOSABS 0.1  --   --   --   --   BASOSABS 0.0  --   --    --   --     Chemistries  Recent Labs  Lab 04/05/19 0946  04/06/19 1337 04/06/19 2019 04/07/19 0906 04/08/19 0357 04/09/19 0900  NA 138   < > 143 143 145 147* 142  K 6.8*   < > 5.4* 5.5* 5.0 4.8 4.3  CL 112*   < > 105 103 98 96* 96*  CO2 10*   < >  20* 24 32 33* 32  GLUCOSE 115*   < > 97 109* 112* 103* 154*  BUN 158*   < > 144* 138* 129* 127* 117*  CREATININE 10.48*   < > 8.54* 8.05* 7.33* 7.04* 6.01*  CALCIUM 9.1   < > 8.3* 8.2* 7.9* 8.1* 8.1*  MG  --   --   --   --   --  1.5*  --   AST 43*  --   --   --   --   --   --   ALT 24  --   --   --   --   --   --   ALKPHOS 133*  --   --   --   --   --   --   BILITOT 0.6  --   --   --   --   --   --    < > = values in this interval not displayed.   ------------------------------------------------------------------------------------------------------------------ No results for input(s): CHOL, HDL, LDLCALC, TRIG, CHOLHDL, LDLDIRECT in the last 72 hours.  Lab Results  Component Value Date   HGBA1C 6.0 (H) 04/06/2019   ------------------------------------------------------------------------------------------------------------------ No results for input(s): TSH, T4TOTAL, T3FREE, THYROIDAB in the last 72 hours.  Invalid input(s): FREET3 ------------------------------------------------------------------------------------------------------------------ No results for input(s): VITAMINB12, FOLATE, FERRITIN, TIBC, IRON, RETICCTPCT in the last 72 hours.  Coagulation profile Recent Labs  Lab 04/05/19 0946  INR 1.1    No results for input(s): DDIMER in the last 72 hours.  Cardiac Enzymes Recent Labs  Lab 04/06/19 0015 04/06/19 0855 04/06/19 2019  TROPONINI 13.96* 17.34* 9.60*   ------------------------------------------------------------------------------------------------------------------ No results found for: BNP   Shon Haleourage Naavya Postma M.D on 04/09/2019 at 11:07 AM  Go to [email or web address] - for contact info  [facility] -  Office  [phone]

## 2019-04-09 NOTE — Evaluation (Signed)
Occupational Therapy Evaluation Patient Details Name: Gregory [name] MRN: [id number] DOB: 10-20-1951 Today's Date: 04/09/2019    History of Present Illness Pt is a 68 y/o male admitted secondary to slurred speech and R sided weakness. 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  CT angiogram does show left carotid stenosis.  Review of patient's chart shows that he had cerebral catheter angiogram in September 2019 which showed only 50% extracranial carotid stenosis on either side.  Patient has been found to have hydronephrosis, renal failure as well as NSTEMI. Per cardiology, pt is not a candidate for any invasive evaluation.  He will require medical treatment/supportive care. PMH including but not limited to CKD, HTN and HLD.   Clinical Impression   Pt admitted with above. He demonstrates the below listed deficits and will benefit from continued OT to maximize safety and independence with BADLs.  Pt presents to OT with Rt hemiparesis, impaired balance, Rt inattention, ideational and ideomotor apraxia.  He currently requires mod - total A for ADLs and max A +2 for sit to stand (briefly).  Per chart review, it appears pt was mod I for short distance ambulation and required assist for ADLs.  Recommend CIR to reduce burden of care and allow pt to maximize independence with ADLs and functional mobility.       Follow Up Recommendations  CIR;Supervision/Assistance - 24 hour    Equipment Recommendations  None recommended by OT    Recommendations for Other Services Rehab consult     Precautions / Restrictions Precautions Precautions: Fall Restrictions Weight Bearing Restrictions: No      Mobility Bed Mobility Overal bed mobility: Needs Assistance Bed Mobility: Supine to Sit;Sit to Supine     Supine to sit: Mod assist;HOB elevated;+2 for physical assistance Sit to supine: Mod assist;+2 for physical assistance   General bed mobility comments: mod A at LE's and trunk to rise as well  as at hips to scoot to EOB. However, when pt wanted to return to supine, he initiated scooting L and back into bed on his own. Still needed mod A for LE's into bed  Transfers Overall transfer level: Needs assistance Equipment used: 2 person hand held assist Transfers: Sit to/from Stand Sit to Stand: Max assist;+2 physical assistance         General transfer comment: B knees and feet blocked and max A +2 given but pt achieved only partial stand with legs braced against the bed and posterior lean    Balance Overall balance assessment: Needs assistance Sitting-balance support: Feet supported;Single extremity supported Sitting balance-Leahy Scale: Fair     Standing balance support: Bilateral upper extremity supported Standing balance-Leahy Scale: Zero Standing balance comment: Pt required assist to prevent Rt knee buckling as well as facilitation at Rt ischium and upper trunk to achieve standing briefly                            ADL either performed or assessed with clinical judgement   ADL Overall ADL's : Needs assistance/impaired Eating/Feeding: Moderate assistance;Bed level   Grooming: Wash/dry hands;Wash/dry face;Oral care;Brushing hair;Moderate assistance;Sitting Grooming Details (indicate cue type and reason): Pt attempted to brush beard/face with toothbrush when asked to demonstrate use of toothbrush  Upper Body Bathing: Maximal assistance;Sitting   Lower Body Bathing: Maximal assistance;Sit to/from stand   Upper Body Dressing : Maximal assistance;Sitting   Lower Body Dressing: Total assistance;Sit to/from stand   Toilet Transfer: Total assistance Toilet  Transfer Details (indicate cue type and reason): unable to safely attempt  Toileting- Clothing Manipulation and Hygiene: Total assistance;Sit to/from stand       Functional mobility during ADLs: Maximal assistance;+2 for physical assistance;+2 for safety/equipment       Vision   Additional Comments:  Pt unable to participate in formal visual assessment, but does appear to have Rt visual field deficit vs. neglect      Perception Perception Perception Tested?: Yes Perception Deficits: Inattention/neglect Inattention/Neglect: Does not attend to right side of body;Does not attend to right visual field Spatial deficits: with therpist seated on his Rt side, he makes not attempt to look that direction of acknowledge tactile input on that side    Praxis Praxis Praxis tested?: Deficits Deficits: Initiation;Ideomotor;Ideation Praxis-Other Comments: attempted to use toothbrush to brush face and beard     Pertinent Vitals/Pain Pain Assessment: Faces Faces Pain Scale: Hurts little more Pain Location: R foot to touch Pain Descriptors / Indicators: Grimacing Pain Intervention(s): Monitored during session;Limited activity within patient's tolerance     Hand Dominance Left   Extremity/Trunk Assessment Upper Extremity Assessment Upper Extremity Assessment: RUE deficits/detail RUE Deficits / Details: Pt demonstrates movement Rt UE in Brunnstrom stage III - movement dominated by synergy  RUE Coordination: decreased fine motor;decreased gross motor   Lower Extremity Assessment Lower Extremity Assessment: Defer to PT evaluation   Cervical / Trunk Assessment Cervical / Trunk Assessment: Other exceptions Cervical / Trunk Exceptions: decreased trunk activation and control on Rt    Communication Communication Communication: Receptive difficulties;Expressive difficulties   Cognition Arousal/Alertness: Awake/alert Behavior During Therapy: Flat affect Overall Cognitive Status: Difficult to assess Area of Impairment: Following commands;Problem solving                       Following Commands: Follows one step commands with increased time;Follows one step commands inconsistently     Problem Solving: Slow processing;Decreased initiation;Difficulty sequencing;Requires tactile  cues General Comments: pt does not respond well to verbal cues, does better with tactile cues and demonstration   General Comments  Pt with heavy breathing with exertion. HR 90's to 117 bpm. SpO2 mid 90's throughout on RA. BP 105/66 after session. Pt able to get "T" out asking for TV to be turned on at end of session.     Exercises     Shoulder Instructions      Home Living Family/patient expects to be discharged to:: Unsure                                 Additional Comments: pt with great difficulty speaking but able to nod "yes" and shake head "no" in response to some questions - pt lives with his spouse and was previously independent      Prior Functioning/Environment Level of Independence: Needs assistance        Comments: Per chart review/old therapy notes, pt was able to ambulate short distances, and used a scooter for longer distances and required assist for ADLs - chart indicates pt is divorced         OT Problem List: Decreased strength;Decreased range of motion;Decreased activity tolerance;Impaired balance (sitting and/or standing);Impaired vision/perception;Decreased coordination;Decreased cognition;Decreased safety awareness;Decreased knowledge of use of DME or AE;Impaired sensation;Impaired UE functional use;Obesity      OT Treatment/Interventions: Self-care/ADL training;Neuromuscular education;DME and/or AE instruction;Therapeutic activities;Cognitive remediation/compensation;Visual/perceptual remediation/compensation;Patient/family education;Balance training    OT Goals(Current goals can be found in  the care plan section) Acute Rehab OT Goals Patient Stated Goal: unable to state OT Goal Formulation: Patient unable to participate in goal setting Time For Goal Achievement: 04/23/19 Potential to Achieve Goals: Good ADL Goals Pt Will Perform Grooming: with min assist;sitting Pt Will Perform Upper Body Bathing: with mod assist;sitting Pt Will Perform  Lower Body Bathing: with mod assist;sit to/from stand Pt Will Transfer to Toilet: with mod assist;stand pivot transfer;bedside commode Pt Will Perform Toileting - Clothing Manipulation and hygiene: with mod assist;sit to/from stand Additional ADL Goal #1: Pt will locate ADL items on Rt with min cues Additional ADL Goal #2: Pt will reach for and retrieve items at 60* shoulder flexion with min facilitation  OT Frequency: Min 2X/week   Barriers to D/C: Decreased caregiver support  unsure level of assist family is able to provide        Co-evaluation PT/OT/SLP Co-Evaluation/Treatment: Yes Reason for Co-Treatment: Complexity of the patient's impairments (multi-system involvement);Necessary to address cognition/behavior during functional activity;For patient/therapist safety;To address functional/ADL transfers PT goals addressed during session: Mobility/safety with mobility;Balance OT goals addressed during session: ADL's and self-care      AM-PAC OT "6 Clicks" Daily Activity     Outcome Measure Help from another person eating meals?: A Lot Help from another person taking care of personal grooming?: A Lot Help from another person toileting, which includes using toliet, bedpan, or urinal?: Total Help from another person bathing (including washing, rinsing, drying)?: A Lot Help from another person to put on and taking off regular upper body clothing?: A Lot Help from another person to put on and taking off regular lower body clothing?: Total 6 Click Score: 10   End of Session Nurse Communication: Mobility status  Activity Tolerance: Patient tolerated treatment well Patient left: in bed;with call bell/phone within reach;with bed alarm set  OT Visit Diagnosis: Unsteadiness on feet (R26.81);Hemiplegia and hemiparesis Hemiplegia - Right/Left: Right Hemiplegia - dominant/non-dominant: Non-Dominant Hemiplegia - caused by: Cerebral infarction                Time: 2458-0998 OT Time  Calculation (min): 22 min Charges:  OT General Charges $OT Visit: 1 Visit OT Evaluation $OT Eval Moderate Complexity: 1 Mod  Jeani Hawking, OTR/L Acute Rehabilitation Services Pager [phone] Office [phone]   Jeani Hawking M 04/09/2019, 6:36 PM

## 2019-04-09 NOTE — Progress Notes (Signed)
Patient ID: Gregory [name], male   DOB: September 22, 1951, 68 y.o.   MRN: [id number] [facility] Progress Note   Assessment/ Plan:   1.  Acute kidney injury on chronic kidney disease stage III:  With bladder outlet obstruction/obstructive uropathy-to leave indwelling Foley until seen by urology as outpatient.  Sluggish improvement of renal function noted with creatinine trending down slowly-highly likely that he has had this obstruction for >2 weeks and may have significant residual renal injury. 2.  Metabolic alkalosis: Possibly iatrogenic following sodium bicarbonate loading in the early phase of his hospitalization for management of metabolic acidosis-respiratory status not affected at this time, no indication for acetazolamide. 3.  Hypernatremia: Limited access to free water due to dysphagia precautions.  On D5W. 4.  Hypertension: Blood pressure improving with nifedipine XL and metoprolol. 5.  Anemia: Replete iron stores and stable hemoglobin/hematocrit with resolution of hematuria. 6.  Elevated/rising troponin levels: Medical management at this time in the setting of acute kidney injury 7.  Acute CVA with right-sided weakness/expressive aphasia: Seen yesterday by speech therapy and started on dysphagia 1 diet/honey thick liquids.  Subjective:   Aphasic, no acute events overnight.   Objective:   BP 127/73 (BP Location: Right Arm)   Pulse 61   Temp 98 F (36.7 C) (Axillary)   Resp 19   Wt 88.8 kg   SpO2 90%   BMI 33.60 kg/m   Intake/Output Summary (Last 24 hours) at 04/09/2019 0850 Last data filed at 04/08/2019 2100 Gross per 24 hour  Intake 752.02 ml  Output 950 ml  Net -197.98 ml   Weight change:   Physical Exam: Gen: Comfortably resting in bed CVS: Pulse regular rhythm, normal rate, S1 and S2 normal Resp: Coarse breath sounds bilaterally, no distinct rales or rhonchi Abd: Soft, obese, nontender Ext: No lower extremity edema  Imaging: Dg Swallowing Func-speech  Pathology  Result Date: 04/07/2019 Objective Swallowing Evaluation: Type of Study: MBS-Modified Barium Swallow Study  Patient Details Name: Gregory [name] MRN: [id number] Date of Birth: [birth year] Today's Date: 04/07/2019 Time: SLP Start Time (ACUTE ONLY): 1140 -SLP Stop Time (ACUTE ONLY): 1200 SLP Time Calculation (min) (ACUTE ONLY): 20 min Past Medical History: Past Medical History: Diagnosis Date . AKI (acute kidney injury) (HCC)  . CKD (chronic kidney disease) stage 2, GFR 60-89 ml/min  . CVA (cerebral vascular accident) (HCC)  . Hyperlipidemia  . Hypertension  . PVD (peripheral vascular disease) (HCC)  Past Surgical History: Past Surgical History: Procedure Laterality Date . AORTIC ARCH ANGIOGRAPHY N/A 09/09/2018  Procedure: AORTIC ARCH ANGIOGRAPHY;  Surgeon: Sherren Kerns, MD;  Location: [facility];  Service: Cardiovascular;  Laterality: N/A; . CAROTID ANGIOGRAPHY Bilateral 09/09/2018  Procedure: CAROTID ANGIOGRAPHY;  Surgeon: Sherren Kerns, MD;  Location: [facility];  Service: Cardiovascular;  Laterality: Bilateral; HPI: Pt is a 68 y.o. male with medical history significant of chronic kidney disease stage III, hypertension, hyperlipidemia, who presented to the hospital with chief complaint of sudden onset right-sided weakness and inability to talk.  MRI of the brain revealed 3.5 cm region of acute infarction affecting the left insula and posterior frontal region.  No data recorded Assessment / Plan / Recommendation CHL IP CLINICAL IMPRESSIONS 04/07/2019 Clinical Impression Pt presents with a severe oral dysphagia with CN VII and XII weakness impacting labial seal and lingual manipulation of the bolus. There is severe anterior spillage and pooling the right buccal cavity. Pt needs increased time and posterior head tilt to gather and  orally transit boluses via slight pumping and passive spillage.  Palatal elevation is weak, does not occur until swallow triggered. There is a plate on the  cervical spine starting at C3 causing protrusion of the posterior pharyngeal wall and deflecting delayed nectar liquids into the vestibule. Due to pts body habitus it is very difficult to see the glottis and subglottis. Penetration is visible, strongly suspect airway protection, but cannot confirm. No cough was elicited, but pt could attempt a throat clear. Best textures are honey thick liquids via spoon and puree. Oral suction, second swallows and intermittent throat clearing will be beneficial to clear oral and base of tongue residue post swallow. Hope for increasing automaticity and strength with diet. Meds will need to be crushed.   SLP Visit Diagnosis Dysphagia, oropharyngeal phase (R13.12) Attention and concentration deficit following -- Frontal lobe and executive function deficit following -- Impact on safety and function Severe aspiration risk   CHL IP TREATMENT RECOMMENDATION 04/07/2019 Treatment Recommendations Therapy as outlined in treatment plan below   No flowsheet data found. CHL IP DIET RECOMMENDATION 04/07/2019 SLP Diet Recommendations Dysphagia 1 (Puree) solids Liquid Administration via Spoon Medication Administration Crushed with puree Compensations Slow rate;Small sips/bites;Monitor for anterior loss;Lingual sweep for clearance of pocketing;Multiple dry swallows after each bite/sip;Clear throat intermittently Postural Changes Seated upright at 90 degrees   CHL IP OTHER RECOMMENDATIONS 04/07/2019 Recommended Consults -- Oral Care Recommendations -- Other Recommendations Have oral suction available   CHL IP FOLLOW UP RECOMMENDATIONS 04/07/2019 Follow up Recommendations Inpatient Rehab   CHL IP FREQUENCY AND DURATION 04/07/2019 Speech Therapy Frequency (ACUTE ONLY) min 2x/week Treatment Duration 2 weeks      CHL IP ORAL PHASE 04/07/2019 Oral Phase Impaired Oral - Pudding Teaspoon -- Oral - Pudding Cup -- Oral - Honey Teaspoon Decreased bolus cohesion;Premature spillage;Delayed oral transit;Lingual/palatal  residue;Right pocketing in lateral sulci;Reduced posterior propulsion;Lingual pumping;Weak lingual manipulation;Right anterior bolus loss Oral - Honey Cup -- Oral - Nectar Teaspoon Decreased bolus cohesion;Premature spillage;Delayed oral transit;Lingual/palatal residue;Right pocketing in lateral sulci;Reduced posterior propulsion;Lingual pumping;Weak lingual manipulation;Right anterior bolus loss Oral - Nectar Cup Decreased bolus cohesion;Premature spillage;Delayed oral transit;Lingual/palatal residue;Right pocketing in lateral sulci;Reduced posterior propulsion;Lingual pumping;Weak lingual manipulation;Right anterior bolus loss Oral - Nectar Straw -- Oral - Thin Teaspoon -- Oral - Thin Cup -- Oral - Thin Straw -- Oral - Puree Decreased bolus cohesion;Premature spillage;Delayed oral transit;Lingual/palatal residue;Right pocketing in lateral sulci;Reduced posterior propulsion;Lingual pumping;Weak lingual manipulation;Right anterior bolus loss Oral - Mech Soft -- Oral - Regular -- Oral - Multi-Consistency -- Oral - Pill -- Oral Phase - Comment --  CHL IP PHARYNGEAL PHASE 04/07/2019 Pharyngeal Phase Impaired Pharyngeal- Pudding Teaspoon -- Pharyngeal -- Pharyngeal- Pudding Cup -- Pharyngeal -- Pharyngeal- Honey Teaspoon Delayed swallow initiation-vallecula;Pharyngeal residue - valleculae Pharyngeal -- Pharyngeal- Honey Cup -- Pharyngeal -- Pharyngeal- Nectar Teaspoon Delayed swallow initiation-pyriform sinuses;Penetration/Aspiration before swallow;Penetration/Aspiration during swallow;Penetration/Apiration after swallow;Pharyngeal residue - valleculae Pharyngeal Material enters airway, remains ABOVE vocal cords and not ejected out;Material enters airway, remains ABOVE vocal cords then ejected out Pharyngeal- Nectar Cup Delayed swallow initiation-pyriform sinuses;Penetration/Aspiration before swallow;Penetration/Aspiration during swallow;Penetration/Apiration after swallow;Pharyngeal residue - valleculae Pharyngeal  Material enters airway, remains ABOVE vocal cords and not ejected out Pharyngeal- Nectar Straw -- Pharyngeal -- Pharyngeal- Thin Teaspoon -- Pharyngeal -- Pharyngeal- Thin Cup -- Pharyngeal -- Pharyngeal- Thin Straw -- Pharyngeal -- Pharyngeal- Puree Pharyngeal residue - valleculae;Delayed swallow initiation-vallecula Pharyngeal Material does not enter airway Pharyngeal- Mechanical Soft -- Pharyngeal -- Pharyngeal- Regular -- Pharyngeal -- Pharyngeal- Multi-consistency -- Pharyngeal --  Pharyngeal- Pill -- Pharyngeal -- Pharyngeal Comment --  No flowsheet data found. DeBlois, Riley Nearing 04/07/2019, 1:22 PM               Labs: BMET Recent Labs  Lab 04/05/19 1313 04/06/19 0015 04/06/19 0818 04/06/19 1337 04/06/19 2019 04/07/19 0906 04/08/19 0357  NA 138 140 141 143 143 145 147*  K 5.9* 6.1* 5.7* 5.4* 5.5* 5.0 4.8  CL 113* 110 106 105 103 98 96*  CO2 10* 16* 18* 20* 24 32 33*  GLUCOSE 131* 112* 94 97 109* 112* 103*  BUN 158* 154* 147* 144* 138* 129* 127*  CREATININE 10.14* 9.56* 8.83* 8.54* 8.05* 7.33* 7.04*  CALCIUM 9.0 8.7* 8.2* 8.3* 8.2* 7.9* 8.1*  PHOS  --  6.2*  --   --   --   --   --    CBC Recent Labs  Lab 04/05/19 0946 04/06/19 1337 04/06/19 2019 04/07/19 0906 04/08/19 0357  WBC 8.8 8.3 10.1 14.0* 10.7*  NEUTROABS 6.9  --   --   --   --   HGB 10.5* 7.8* 9.9* 9.4* 9.5*  HCT 36.3* 24.4* 31.6* 30.4* 30.6*  MCV 97.8 88.7 88.3 88.6 90.5  PLT 179 179 182 164 150    Medications:    . aspirin  300 mg Rectal Daily   Or  . aspirin  325 mg Oral Daily  . atorvastatin  80 mg Oral q1800  . metoprolol tartrate  5 mg Intravenous Q6H  . NIFEdipine  30 mg Oral Daily   Zetta Bills, MD 04/09/2019, 8:50 AM

## 2019-04-09 NOTE — Progress Notes (Signed)
Progress Note  Patient Name: Gregory [name] Date of Encounter: 04/09/2019  Primary Cardiologist: Dr. Tonny Bollman Electrophysiologist: Dr. Lewayne Bunting  Subjective   Aphasic as before.  He is awake and nods his head to questions.  No distress.  Inpatient Medications    Scheduled Meds:  aspirin  300 mg Rectal Daily   Or   aspirin  325 mg Oral Daily   atorvastatin  80 mg Oral q1800   metoprolol tartrate  5 mg Intravenous Q6H   NIFEdipine  30 mg Oral Daily   Continuous Infusions:  cefTRIAXone (ROCEPHIN)  IV Stopped (04/08/19 1147)   dextrose 100 mL/hr at 04/08/19 2347   PRN Meds: acetaminophen **OR** acetaminophen (TYLENOL) oral liquid 160 mg/5 mL **OR** acetaminophen, acetaminophen, hydrALAZINE, ondansetron (ZOFRAN) IV, Resource ThickenUp Clear   Vital Signs    Vitals:   04/08/19 1959 04/09/19 0000 04/09/19 0400 04/09/19 0724  BP: 106/78 111/83 118/70 127/73  Pulse: 87 74 84 61  Resp: Temp: 97.7 F (36.5 C) 97.9 F (36.6 C) 98.3 F (36.8 C) 98 F (36.7 C)  TempSrc: Oral Oral Oral Axillary  SpO2: 96% 94% 98% 90%  Weight:        Intake/Output Summary (Last 24 hours) at 04/09/2019 0956 Last data filed at 04/08/2019 2100 Gross per 24 hour  Intake 752.02 ml  Output 950 ml  Net -197.98 ml   Filed Weights   04/05/19 0947  Weight: 88.8 kg    Telemetry    Sinus rhythm and MAT.  Personally reviewed.  Physical Exam   GEN:  Obese male.  No acute distress.   Neck: No JVD. Cardiac:  Largely regular, 2/6 systolic murmur, no gallop. Respiratory: Nonlabored.  Scattered rhonchi. GI: Soft, nontender, bowel sounds present. MS: No edema; No deformity. Neuro:   Aphasic, right-sided weakness.  Labs    Chemistry Recent Labs  Lab 04/05/19 0946  04/06/19 0015  04/06/19 2019 04/07/19 0906 04/08/19 0357  NA 138   < > 140   < > 143 145 147*  K 6.8*   < > 6.1*   < > 5.5* 5.0 4.8  CL 112*   < > 110   < > 103 98 96*  CO2 10*   < > 16*   < >  24 32 33*  GLUCOSE 115*   < > 112*   < > 109* 112* 103*  BUN 158*   < > 154*   < > 138* 129* 127*  CREATININE 10.48*   < > 9.56*   < > 8.05* 7.33* 7.04*  CALCIUM 9.1   < > 8.7*   < > 8.2* 7.9* 8.1*  PROT 7.8  --   --   --   --   --   --   ALBUMIN 3.9  --  3.3*  --   --   --   --   AST 43*  --   --   --   --   --   --   ALT 24  --   --   --   --   --   --   ALKPHOS 133*  --   --   --   --   --   --   BILITOT 0.6  --   --   --   --   --   --   GFRNONAA 4*   < > 5*   < > 6* 7* 7*  GFRAA 5*   < > 6*   < > 7* 8* 8*  ANIONGAP 16*   < > 14   < > 16* 15 18*   < > = values in this interval not displayed.     Hematology Recent Labs  Lab 04/06/19 2019 04/07/19 0906 04/08/19 0357  WBC 10.1 14.0* 10.7*  RBC 3.58* 3.43* 3.38*  HGB 9.9* 9.4* 9.5*  HCT 31.6* 30.4* 30.6*  MCV 88.3 88.6 90.5  MCH 27.7 27.4 28.1  MCHC 31.3 30.9 31.0  RDW 13.2 13.1 13.3  PLT 182 164 150    Cardiac Enzymes Recent Labs  Lab 04/05/19 1830 04/06/19 0015 04/06/19 0855 04/06/19 2019  TROPONINI 10.69* 13.96* 17.34* 9.60*    Recent Labs  Lab 04/05/19 1118  TROPIPOC 3.99*     Radiology    Dg Swallowing Func-speech Pathology  Result Date: 04/07/2019 Objective Swallowing Evaluation: Type of Study: MBS-Modified Barium Swallow Study  Patient Details Name: Gregory [name] MRN: [id number] Date of Birth: 03/10/1951 Today's Date: 04/07/2019 Time: SLP Start Time (ACUTE ONLY): 1140 -SLP Stop Time (ACUTE ONLY): 1200 SLP Time Calculation (min) (ACUTE ONLY): 20 min Past Medical History: Past Medical History: Diagnosis Date  AKI (acute kidney injury) (HCC)   CKD (chronic kidney disease) stage 2, GFR 60-89 ml/min   CVA (cerebral vascular accident) (HCC)   Hyperlipidemia   Hypertension   PVD (peripheral vascular disease) (HCC)  Past Surgical History: Past Surgical History: Procedure Laterality Date  AORTIC ARCH ANGIOGRAPHY N/A 09/09/2018  Procedure: AORTIC ARCH ANGIOGRAPHY;  Surgeon: Sherren KernsFields, Charles E, MD;  Location: [facility];  Service: Cardiovascular;  Laterality: N/A;  CAROTID ANGIOGRAPHY Bilateral 09/09/2018  Procedure: CAROTID ANGIOGRAPHY;  Surgeon: Sherren KernsFields, Charles E, MD;  Location: [facility];  Service: Cardiovascular;  Laterality: Bilateral; HPI: Pt is a 68 y.o. male with medical history significant of chronic kidney disease stage III, hypertension, hyperlipidemia, who presented to the hospital with chief complaint of sudden onset right-sided weakness and inability to talk.  MRI of the brain revealed 3.5 cm region of acute infarction affecting the left insula and posterior frontal region.  No data recorded Assessment / Plan / Recommendation CHL IP CLINICAL IMPRESSIONS 04/07/2019 Clinical Impression Pt presents with a severe oral dysphagia with CN VII and XII weakness impacting labial seal and lingual manipulation of the bolus. There is severe anterior spillage and pooling the right buccal cavity. Pt needs increased time and posterior head tilt to gather and orally transit boluses via slight pumping and passive spillage.  Palatal elevation is weak, does not occur until swallow triggered. There is a plate on the cervical spine starting at C3 causing protrusion of the posterior pharyngeal wall and deflecting delayed nectar liquids into the vestibule. Due to pts body habitus it is very difficult to see the glottis and subglottis. Penetration is visible, strongly suspect airway protection, but cannot confirm. No cough was elicited, but pt could attempt a throat clear. Best textures are honey thick liquids via spoon and puree. Oral suction, second swallows and intermittent throat clearing will be beneficial to clear oral and base of tongue residue post swallow. Hope for increasing automaticity and strength with diet. Meds will need to be crushed.   SLP Visit Diagnosis Dysphagia, oropharyngeal phase (R13.12) Attention and concentration deficit following -- Frontal lobe and executive function deficit following --  Impact on safety and function Severe aspiration risk   CHL IP TREATMENT RECOMMENDATION 04/07/2019 Treatment Recommendations Therapy as outlined in treatment plan  below   No flowsheet data found. CHL IP DIET RECOMMENDATION 04/07/2019 SLP Diet Recommendations Dysphagia 1 (Puree) solids Liquid Administration via Spoon Medication Administration Crushed with puree Compensations Slow rate;Small sips/bites;Monitor for anterior loss;Lingual sweep for clearance of pocketing;Multiple dry swallows after each bite/sip;Clear throat intermittently Postural Changes Seated upright at 90 degrees   CHL IP OTHER RECOMMENDATIONS 04/07/2019 Recommended Consults -- Oral Care Recommendations -- Other Recommendations Have oral suction available   CHL IP FOLLOW UP RECOMMENDATIONS 04/07/2019 Follow up Recommendations Inpatient Rehab   CHL IP FREQUENCY AND DURATION 04/07/2019 Speech Therapy Frequency (ACUTE ONLY) min 2x/week Treatment Duration 2 weeks      CHL IP ORAL PHASE 04/07/2019 Oral Phase Impaired Oral - Pudding Teaspoon -- Oral - Pudding Cup -- Oral - Honey Teaspoon Decreased bolus cohesion;Premature spillage;Delayed oral transit;Lingual/palatal residue;Right pocketing in lateral sulci;Reduced posterior propulsion;Lingual pumping;Weak lingual manipulation;Right anterior bolus loss Oral - Honey Cup -- Oral - Nectar Teaspoon Decreased bolus cohesion;Premature spillage;Delayed oral transit;Lingual/palatal residue;Right pocketing in lateral sulci;Reduced posterior propulsion;Lingual pumping;Weak lingual manipulation;Right anterior bolus loss Oral - Nectar Cup Decreased bolus cohesion;Premature spillage;Delayed oral transit;Lingual/palatal residue;Right pocketing in lateral sulci;Reduced posterior propulsion;Lingual pumping;Weak lingual manipulation;Right anterior bolus loss Oral - Nectar Straw -- Oral - Thin Teaspoon -- Oral - Thin Cup -- Oral - Thin Straw -- Oral - Puree Decreased bolus cohesion;Premature spillage;Delayed oral  transit;Lingual/palatal residue;Right pocketing in lateral sulci;Reduced posterior propulsion;Lingual pumping;Weak lingual manipulation;Right anterior bolus loss Oral - Mech Soft -- Oral - Regular -- Oral - Multi-Consistency -- Oral - Pill -- Oral Phase - Comment --  CHL IP PHARYNGEAL PHASE 04/07/2019 Pharyngeal Phase Impaired Pharyngeal- Pudding Teaspoon -- Pharyngeal -- Pharyngeal- Pudding Cup -- Pharyngeal -- Pharyngeal- Honey Teaspoon Delayed swallow initiation-vallecula;Pharyngeal residue - valleculae Pharyngeal -- Pharyngeal- Honey Cup -- Pharyngeal -- Pharyngeal- Nectar Teaspoon Delayed swallow initiation-pyriform sinuses;Penetration/Aspiration before swallow;Penetration/Aspiration during swallow;Penetration/Apiration after swallow;Pharyngeal residue - valleculae Pharyngeal Material enters airway, remains ABOVE vocal cords and not ejected out;Material enters airway, remains ABOVE vocal cords then ejected out Pharyngeal- Nectar Cup Delayed swallow initiation-pyriform sinuses;Penetration/Aspiration before swallow;Penetration/Aspiration during swallow;Penetration/Apiration after swallow;Pharyngeal residue - valleculae Pharyngeal Material enters airway, remains ABOVE vocal cords and not ejected out Pharyngeal- Nectar Straw -- Pharyngeal -- Pharyngeal- Thin Teaspoon -- Pharyngeal -- Pharyngeal- Thin Cup -- Pharyngeal -- Pharyngeal- Thin Straw -- Pharyngeal -- Pharyngeal- Puree Pharyngeal residue - valleculae;Delayed swallow initiation-vallecula Pharyngeal Material does not enter airway Pharyngeal- Mechanical Soft -- Pharyngeal -- Pharyngeal- Regular -- Pharyngeal -- Pharyngeal- Multi-consistency -- Pharyngeal -- Pharyngeal- Pill -- Pharyngeal -- Pharyngeal Comment --  No flowsheet data found. DeBlois, Riley Nearing 04/07/2019, 1:22 PM               Cardiac Studies   Echocardiogram 04/05/2019: 1. The left ventricle has normal systolic function, with an ejection fraction of 55-60%. The cavity size was normal.  There is moderately increased left ventricular wall thickness. Left ventricular diastolic Doppler parameters are consistent with  impaired relaxation. No evidence of left ventricular regional wall motion abnormalities. 2. The right ventricle has normal systolic function. The cavity was normal. There is no increase in right ventricular wall thickness. 3. Mild thickening of the mitral valve leaflet. Mild calcification of the mitral valve leaflet. There is moderate mitral annular calcification present. 4. The aortic valve has an indeterminate number of cusps. Moderate thickening of the aortic valve. Moderate calcification of the aortic valve. Mild-moderate stenosis of the aortic valve. 5. The aortic root is normal in size and structure. 6. No intracardiac thrombi or masses were  visualized  Patient Profile     68 y.o. male with history of CKD stage III, hypertension, and hyperlipidemia now presenting with acute stroke, multifocal atrial tachycardia, acute on chronic renal failure in the setting of obstructive uropathy, and NSTEMI.  Assessment & Plan    1.  NSTEMI with peak troponin I of 17.34.  He is being managed medically and remains hemodynamically stable.  Echocardiogram reveals LVEF 55 to 60%.  2.  Multifocal atrial tachycardia.  3.  Acute ischemic stroke with expressive aphasia and right-sided weakness.  ILR placed by Dr. Ladona Ridgel.  4.  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  Creatinine is down to 6.01.  Converting to oral regimen for heart rate control and now on combination of Procardia XL and Lopressor.  Titrate for effect presuming blood pressure tolerates.  Also now on Lipitor and aspirin.  Signed, Nona Dell, MD  04/09/2019, 9:56 AM

## 2019-04-10 ENCOUNTER — Encounter (HOSPITAL_COMMUNITY): Payer: Self-pay | Admitting: Internal Medicine

## 2019-04-10 ENCOUNTER — Inpatient Hospital Stay (HOSPITAL_COMMUNITY)
Admission: RE | Admit: 2019-04-10 | Discharge: 2019-04-14 | DRG: 056 | Disposition: A | Discharge status: Other Acute Inpatient Hospital | Payer: Medicare HMO | Source: Intra-hospital | Attending: Physical Medicine & Rehabilitation | Admitting: Physical Medicine & Rehabilitation

## 2019-04-10 ENCOUNTER — Other Ambulatory Visit: Payer: Self-pay

## 2019-04-10 DIAGNOSIS — E870 Hyperosmolality and hypernatremia: Secondary | ICD-10-CM | POA: Diagnosis present

## 2019-04-10 DIAGNOSIS — I4819 Other persistent atrial fibrillation: Secondary | ICD-10-CM

## 2019-04-10 DIAGNOSIS — I6340 Cerebral infarction due to embolism of unspecified cerebral artery: Secondary | ICD-10-CM | POA: Diagnosis present

## 2019-04-10 DIAGNOSIS — Z6832 Body mass index (BMI) 32.0-32.9, adult: Secondary | ICD-10-CM | POA: Diagnosis not present

## 2019-04-10 DIAGNOSIS — R1310 Dysphagia, unspecified: Secondary | ICD-10-CM | POA: Diagnosis present

## 2019-04-10 DIAGNOSIS — E875 Hyperkalemia: Secondary | ICD-10-CM | POA: Diagnosis not present

## 2019-04-10 DIAGNOSIS — I63412 Cerebral infarction due to embolism of left middle cerebral artery: Secondary | ICD-10-CM | POA: Diagnosis not present

## 2019-04-10 DIAGNOSIS — N179 Acute kidney failure, unspecified: Secondary | ICD-10-CM | POA: Diagnosis present

## 2019-04-10 DIAGNOSIS — I214 Non-ST elevation (NSTEMI) myocardial infarction: Secondary | ICD-10-CM | POA: Diagnosis present

## 2019-04-10 DIAGNOSIS — G4733 Obstructive sleep apnea (adult) (pediatric): Secondary | ICD-10-CM | POA: Diagnosis present

## 2019-04-10 DIAGNOSIS — N401 Enlarged prostate with lower urinary tract symptoms: Secondary | ICD-10-CM | POA: Diagnosis present

## 2019-04-10 DIAGNOSIS — I129 Hypertensive chronic kidney disease with stage 1 through stage 4 chronic kidney disease, or unspecified chronic kidney disease: Secondary | ICD-10-CM | POA: Diagnosis present

## 2019-04-10 DIAGNOSIS — N320 Bladder-neck obstruction: Secondary | ICD-10-CM | POA: Diagnosis present

## 2019-04-10 DIAGNOSIS — D649 Anemia, unspecified: Secondary | ICD-10-CM | POA: Diagnosis present

## 2019-04-10 DIAGNOSIS — I69351 Hemiplegia and hemiparesis following cerebral infarction affecting right dominant side: Principal | ICD-10-CM

## 2019-04-10 DIAGNOSIS — N183 Chronic kidney disease, stage 3 (moderate): Secondary | ICD-10-CM | POA: Diagnosis present

## 2019-04-10 DIAGNOSIS — Z7982 Long term (current) use of aspirin: Secondary | ICD-10-CM | POA: Diagnosis not present

## 2019-04-10 DIAGNOSIS — G8191 Hemiplegia, unspecified affecting right dominant side: Secondary | ICD-10-CM | POA: Diagnosis not present

## 2019-04-10 DIAGNOSIS — I471 Supraventricular tachycardia: Secondary | ICD-10-CM | POA: Diagnosis present

## 2019-04-10 DIAGNOSIS — E785 Hyperlipidemia, unspecified: Secondary | ICD-10-CM | POA: Diagnosis present

## 2019-04-10 DIAGNOSIS — I63419 Cerebral infarction due to embolism of unspecified middle cerebral artery: Secondary | ICD-10-CM

## 2019-04-10 DIAGNOSIS — I10 Essential (primary) hypertension: Secondary | ICD-10-CM | POA: Diagnosis not present

## 2019-04-10 DIAGNOSIS — N139 Obstructive and reflux uropathy, unspecified: Secondary | ICD-10-CM | POA: Diagnosis present

## 2019-04-10 DIAGNOSIS — Z7902 Long term (current) use of antithrombotics/antiplatelets: Secondary | ICD-10-CM | POA: Diagnosis not present

## 2019-04-10 DIAGNOSIS — I69320 Aphasia following cerebral infarction: Secondary | ICD-10-CM

## 2019-04-10 DIAGNOSIS — I6300 Cerebral infarction due to thrombosis of unspecified precerebral artery: Secondary | ICD-10-CM | POA: Diagnosis not present

## 2019-04-10 DIAGNOSIS — I69391 Dysphagia following cerebral infarction: Secondary | ICD-10-CM

## 2019-04-10 DIAGNOSIS — R4701 Aphasia: Secondary | ICD-10-CM | POA: Diagnosis not present

## 2019-04-10 LAB — CBC
HCT: 33.5 % — ABNORMAL LOW (ref 39.0–52.0)
Hemoglobin: 10 g/dL — ABNORMAL LOW (ref 13.0–17.0)
MCH: 27.9 pg (ref 26.0–34.0)
MCHC: 29.9 g/dL — ABNORMAL LOW (ref 30.0–36.0)
MCV: 93.6 fL (ref 80.0–100.0)
Platelets: 183 10*3/uL (ref 150–400)
RBC: 3.58 MIL/uL — ABNORMAL LOW (ref 4.22–5.81)
RDW: 13.4 % (ref 11.5–15.5)
WBC: 10.9 10*3/uL — ABNORMAL HIGH (ref 4.0–10.5)
nRBC: 0 % (ref 0.0–0.2)

## 2019-04-10 LAB — BASIC METABOLIC PANEL
Anion gap: 15 (ref 5–15)
BUN: 104 mg/dL — ABNORMAL HIGH (ref 8–23)
CO2: 30 mmol/L (ref 22–32)
Calcium: 8.2 mg/dL — ABNORMAL LOW (ref 8.9–10.3)
Chloride: 97 mmol/L — ABNORMAL LOW (ref 98–111)
Creatinine, Ser: 5.78 mg/dL — ABNORMAL HIGH (ref 0.61–1.24)
GFR calc Af Amer: 11 mL/min — ABNORMAL LOW (ref >60)
GFR calc non Af Amer: 9 mL/min — ABNORMAL LOW (ref >60)
Glucose, Bld: 120 mg/dL — ABNORMAL HIGH (ref 70–99)
Potassium: 4.5 mmol/L (ref 3.5–5.1)
Sodium: 142 mmol/L (ref 135–145)

## 2019-04-10 MED ORDER — METOPROLOL TARTRATE 50 MG PO TABS: 50.0000 mg | ORAL_TABLET | Freq: Two times a day (BID) | ORAL | 3 refills | Status: DC

## 2019-04-10 MED ORDER — ATORVASTATIN CALCIUM 80 MG PO TABS
80.0000 mg | ORAL_TABLET | Freq: Every day | ORAL | Status: DC
  Administered 2019-04-11 – 2019-04-13 (×3): 80 mg via ORAL
  Filled 2019-04-10 (×4): qty 1

## 2019-04-10 MED ORDER — METOPROLOL TARTRATE 25 MG PO TABS
25.0000 mg | ORAL_TABLET | Freq: Four times a day (QID) | ORAL | Status: DC
  Administered 2019-04-10: 25 mg via ORAL
  Filled 2019-04-10: qty 1

## 2019-04-10 MED ORDER — RESOURCE THICKENUP CLEAR PO POWD
ORAL | Status: DC | PRN
  Filled 2019-04-10: qty 125

## 2019-04-10 MED ORDER — ASPIRIN EC 81 MG PO TBEC: 81.0000 mg | DELAYED_RELEASE_TABLET | Freq: Every day | ORAL | 2 refills | Status: DC

## 2019-04-10 MED ORDER — ACETAMINOPHEN 650 MG RE SUPP: 650.0000 mg | RECTAL | Status: DC | PRN

## 2019-04-10 MED ORDER — CLOPIDOGREL BISULFATE 75 MG PO TABS: 75.0000 mg | ORAL_TABLET | Freq: Every day | ORAL | 11 refills | Status: DC

## 2019-04-10 MED ORDER — METOPROLOL TARTRATE 25 MG PO TABS: 25.0000 mg | ORAL_TABLET | Freq: Three times a day (TID) | ORAL | 2 refills | Status: DC

## 2019-04-10 MED ORDER — ACETAMINOPHEN 160 MG/5ML PO SOLN: 650.0000 mg | ORAL | Status: DC | PRN

## 2019-04-10 MED ORDER — ACETAMINOPHEN 325 MG PO TABS: 325.0000 mg | ORAL_TABLET | ORAL | Status: DC | PRN

## 2019-04-10 MED ORDER — SODIUM CHLORIDE 0.45 % IV SOLN
INTRAVENOUS | Status: DC
  Administered 2019-04-10 – 2019-04-11 (×3): via INTRAVENOUS

## 2019-04-10 MED ORDER — ASPIRIN 325 MG PO TABS
325.0000 mg | ORAL_TABLET | Freq: Every day | ORAL | Status: DC
  Administered 2019-04-11 – 2019-04-14 (×4): 325 mg via ORAL
  Filled 2019-04-10 (×4): qty 1

## 2019-04-10 MED ORDER — AMLODIPINE BESYLATE 2.5 MG PO TABS: 2.5000 mg | ORAL_TABLET | Freq: Every day | ORAL | Status: DC

## 2019-04-10 MED ORDER — ACETAMINOPHEN 325 MG PO TABS: 650.0000 mg | ORAL_TABLET | ORAL | 2 refills | Status: AC | PRN

## 2019-04-10 MED ORDER — ASPIRIN 325 MG PO TABS: 325.0000 mg | ORAL_TABLET | Freq: Every day | ORAL | 2 refills | Status: DC

## 2019-04-10 MED ORDER — ASPIRIN 300 MG RE SUPP: 300.0000 mg | Freq: Every day | RECTAL | Status: DC

## 2019-04-10 MED ORDER — ACETAMINOPHEN 325 MG PO TABS
650.0000 mg | ORAL_TABLET | ORAL | Status: DC | PRN
  Administered 2019-04-13: 650 mg via ORAL

## 2019-04-10 MED ORDER — AMLODIPINE BESYLATE 2.5 MG PO TABS: 2.5000 mg | ORAL_TABLET | Freq: Every day | ORAL | 2 refills | Status: DC

## 2019-04-10 NOTE — Progress Notes (Signed)
Inpatient Rehabilitation-Admissions Coordinator   [facility] received insurance approval and medical approval from Dr. Mariea Clonts for admit to CIR today. AC has alerted pt and his family who are agreeable and wanting to proceed. AC will update RN, CM, SW on plan.   Please call if questions.   Nanine Means, OTR/L  Rehab Admissions Coordinator  [phone] 04/10/2019 5:09 PM

## 2019-04-10 NOTE — Care Management Important Message (Signed)
Important Message  Patient Details  Name: Gregory [name] MRN: [id number] Date of Birth: 12-14-1951   Medicare Important Message Given:  Yes    Dorena Bodo 04/10/2019, 2:58 PM

## 2019-04-10 NOTE — Progress Notes (Signed)
Occupational Therapy Treatment Patient Details Name: Gregory HazyDonald Lee [name] MRN: [id number] DOB: August 19, 1951 Today's Date: 04/10/2019    History of present illness Pt is a 68 y/o male admitted secondary to slurred speech and R sided weakness. 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  CT angiogram does show left carotid stenosis.  Review of patient's chart shows that he had cerebral catheter angiogram in September 2019 which showed only 50% extracranial carotid stenosis on either side.  Patient has been found to have hydronephrosis, renal failure as well as NSTEMI. Per cardiology, pt is not a candidate for any invasive evaluation.  He will require medical treatment/supportive care. PMH including but not limited to CKD, HTN and HLD.   OT comments  Pt completed oob to chair this session with stedy used total +2 min (A). Pt initiated and demonstrates use of R UE during session with bed and stedy transfer. Pt following all commands. Pt without verbalization attempts but rather nodding head in response.    Follow Up Recommendations  CIR;Supervision/Assistance - 24 hour    Equipment Recommendations  None recommended by OT    Recommendations for Other Services Rehab consult    Precautions / Restrictions Precautions Precautions: Fall Restrictions Weight Bearing Restrictions: No       Mobility Bed Mobility Overal bed mobility: Needs Assistance Bed Mobility: Rolling;Sidelying to Sit Rolling: Min guard;Min assist Sidelying to sit: Mod assist       General bed mobility comments: pt initiated rolling to R side using R bed rail when given cue to "sit up on the side of the bed"; assist required to bring bilat LE off EOB and to elevate trunk into sitting; cues for sequencing  Transfers Overall transfer level: Needs assistance   Transfers: Sit to/from Stand Sit to Stand: Max assist;+2 physical assistance;Mod assist;+2 safety/equipment         General transfer comment: pt initiating sit  to stand using L side to power up and increased assistance required on R side for R UE positioning and powering up; pt with R lateral lean upon standing and requires increased assist to maintain standing; mod A +2 to get into standing and max A (+1-2) to maintain utilizing Stedy standing frame    Balance Overall balance assessment: Needs assistance Sitting-balance support: Feet supported;Single extremity supported Sitting balance-Leahy Scale: Fair Sitting balance - Comments: assist required initially and then able to maintain sitting balance EOB with min guard Postural control: Right lateral lean Standing balance support: Bilateral upper extremity supported Standing balance-Leahy Scale: Zero Standing balance comment: pt unable to achieve upright posture and with R lateral lean in standing                            ADL either performed or assessed with clinical judgement   ADL Overall ADL's : Needs assistance/impaired Eating/Feeding: Moderate assistance Eating/Feeding Details (indicate cue type and reason): sat up in chair for tech to feed lunch at this time. poor oral closure demonstrated this session with oral secretions         Lower Body Bathing: Total assistance Lower Body Bathing Details (indicate cue type and reason): incontinence and lacks awarness         Toilet Transfer: +2 for physical assistance;Moderate assistance Toilet Transfer Details (indicate cue type and reason): requires Incr (A) on R side and pushign with L UE           General ADL Comments: pt with stedy  used to transfer from bed to chair this ession with (A) to sustain static position in stedy     Vision       Perception     Praxis      Cognition Arousal/Alertness: Awake/alert Behavior During Therapy: Flat affect Overall Cognitive Status: Difficult to assess Area of Impairment: Following commands;Problem solving                       Following Commands: Follows one step  commands with increased time;Follows one step commands inconsistently     Problem Solving: Slow processing;Difficulty sequencing;Requires verbal cues;Requires tactile cues General Comments: pt is able to follow vc this session and responding to questions with head nods        Exercises     Shoulder Instructions       General Comments RA 96% during session    Pertinent Vitals/ Pain       Pain Assessment: Faces Faces Pain Scale: No hurt Pain Descriptors / Indicators: Grimacing Pain Intervention(s): Monitored during session  Home Living                                          Prior Functioning/Environment              Frequency  Min 2X/week        Progress Toward Goals  OT Goals(current goals can now be found in the care plan section)  Progress towards OT goals: Progressing toward goals  Acute Rehab OT Goals Patient Stated Goal: unable to state OT Goal Formulation: Patient unable to participate in goal setting Time For Goal Achievement: 04/23/19 Potential to Achieve Goals: Good ADL Goals Pt Will Perform Grooming: with min assist;sitting Pt Will Perform Upper Body Bathing: with mod assist;sitting Pt Will Perform Lower Body Bathing: with mod assist;sit to/from stand Pt Will Transfer to Toilet: with mod assist;stand pivot transfer;bedside commode Pt Will Perform Toileting - Clothing Manipulation and hygiene: with mod assist;sit to/from stand Additional ADL Goal #1: Pt will locate ADL items on Rt with min cues Additional ADL Goal #2: Pt will reach for and retrieve items at 60* shoulder flexion with min facilitation  Plan Discharge plan remains appropriate    Co-evaluation    PT/OT/SLP Co-Evaluation/Treatment: Yes Reason for Co-Treatment: Complexity of the patient's impairments (multi-system involvement);Necessary to address cognition/behavior during functional activity;For patient/therapist safety;To address functional/ADL transfers PT goals  addressed during session: Mobility/safety with mobility;Balance;Strengthening/ROM OT goals addressed during session: ADL's and self-care;Proper use of Adaptive equipment and DME;Strengthening/ROM      AM-PAC OT "6 Clicks" Daily Activity     Outcome Measure   Help from another person eating meals?: A Lot Help from another person taking care of personal grooming?: A Lot Help from another person toileting, which includes using toliet, bedpan, or urinal?: Total Help from another person bathing (including washing, rinsing, drying)?: A Lot Help from another person to put on and taking off regular upper body clothing?: A Lot Help from another person to put on and taking off regular lower body clothing?: Total 6 Click Score: 10    End of Session Equipment Utilized During Treatment: Gait belt  OT Visit Diagnosis: Unsteadiness on feet (R26.81);Hemiplegia and hemiparesis Hemiplegia - Right/Left: Right Hemiplegia - dominant/non-dominant: Non-Dominant Hemiplegia - caused by: Cerebral infarction   Activity Tolerance Patient tolerated treatment well   Patient Left in chair;with call  bell/phone within reach;with bed alarm set(lift pad placed for RN staff)   Nurse Communication Mobility status;Precautions        Time: 1610-9604 OT Time Calculation (min): 23 min  Charges: OT General Charges $OT Visit: 1 Visit OT Treatments $Therapeutic Activity: 8-22 mins   Mateo Flow, OTR/L  Acute Rehabilitation Services Pager: [phone] Office: [phone] .    Mateo Flow 04/10/2019, 2:41 PM

## 2019-04-10 NOTE — Progress Notes (Signed)
  Speech Language Pathology Treatment: Dysphagia  Patient Details Name: Gregory [name] MRN: [id number] DOB: 04-21-1951 Today's Date: 04/10/2019 Time: 1320-1330 SLP Time Calculation (min) (ACUTE ONLY): 10 min  Assessment / Plan / Recommendation Clinical Impression  Pt clearly indicating discomfort upon entering room.  Sitting in recliner; assisted with repositioning.  Pt amenable to consuming honey-thick liquids, but declined further POs. Consumed several boluses, requiring intermittent verbal cueing to clear throat and utilizing lingual sweep.  Lung sounds are diminished; clear.  There were no overt s/s of aspiration during brief session, which ended early due to ongoing discomfort.  Nursing tech arriving to lift pt back to bed.  HPI HPI: Pt is a 68 y.o. male with medical history significant of chronic kidney disease stage III, hypertension, hyperlipidemia, who presented to the hospital with chief complaint of sudden onset right-sided weakness and inability to talk.  MRI of the brain revealed 3.5 cm region of acute infarction affecting the left insula and posterior frontal region.      SLP Plan  Continue with current plan of care       Recommendations  Diet recommendations: Dysphagia 1 (puree);Honey-thick liquid Liquids provided via: Teaspoon Medication Administration: Crushed with puree Supervision: Full supervision/cueing for compensatory strategies Compensations: Slow rate;Small sips/bites;Monitor for anterior loss;Lingual sweep for clearance of pocketing;Multiple dry swallows after each bite/sip;Clear throat intermittently Postural Changes and/or Swallow Maneuvers: Seated upright 90 degrees                Oral Care Recommendations: Oral care BID Follow up Recommendations: Inpatient Rehab SLP Visit Diagnosis: Dysphagia, oropharyngeal phase (R13.12) Plan: Continue with current plan of care       GO                Gregory [name] 04/10/2019, 1:32 PM  Gregory [name]  L. Samson Frederic, MA CCC/SLP Acute Rehabilitation Services Office number [phone] Pager [phone]

## 2019-04-10 NOTE — H&P (Signed)
Physical Medicine and Rehabilitation Admission H&P    Chief complaint: patient is aphasic  HPI: Gregory [name] is a 68 year old right-handed male with history of chronic kidney disease stage III with creatinine baseline 1.6-1.7, hypertension maintained on HCTZ and lisinopril, hyperlipidemia. Per chart review patient lives with spouse reportedly independent prior to admission. Presented 04/05/2019 with acute onset of right sided weakness and slurred speech. Cranial CT scan showed possible early gray- white differentiation loss in the insula on the left. Chronic small vessel ischemic changes. MRI/MRA showed a 3.5 centimeter region of acute infarction affecting the left insula and posterior frontal region. No evidence of gross hemorrhage. MRA negative. Patient did not receive TPA. Noted findings of hyperkalemia 6.8 as well as elevated creatinine 10.4 rate from baseline 1.70, troponin 3.29. EKG with ST changes. Carotid Dopplers 40-59% ICA stenosis bilaterally.Echocardiogram with ejection fraction of 60% and normal systolic function.  Cardiology service is consulted for suspect non-STEMI medical management recommended. Patient did undergo placement of loop recorder 04/07/2019 per Dr. Ladona Ridgel. Renal ultrasound showed bilateral hydronephrosis with nephrology services consulted as well as urology. A Foley catheter tube was placed and would remain in place until follow-up outpatient urology.renal function continues to improve 5.78 creatinine 04/10/2019. Maintained on aspirin for CVA prophylaxis. Dysphagia #1 honey thick liquid diet. Therapy evaluations completed with recommendations of physical medicine rehabilitation consult. Patient was admitted for a comprehensive rehabilitation program.  Review of Systems  Constitutional: Negative for chills and fever.  HENT: Negative for hearing loss.   Eyes: Negative for double vision.  Respiratory: Negative for cough and shortness of breath.   Cardiovascular:  Positive for palpitations and leg swelling. Negative for chest pain.  Gastrointestinal: Positive for constipation. Negative for heartburn and vomiting.  Genitourinary: Negative for dysuria, flank pain and hematuria.  Musculoskeletal: Positive for myalgias.  Skin: Negative for rash.  Neurological: Positive for weakness. Negative for seizures.  All other systems reviewed and are negative.  Past Medical History:  Diagnosis Date  . AKI (acute kidney injury) (HCC)   . CKD (chronic kidney disease) stage 2, GFR 60-89 ml/min   . CVA (cerebral vascular accident) (HCC)   . Hyperlipidemia   . Hypertension   . PVD (peripheral vascular disease) (HCC)    Past Surgical History:  Procedure Laterality Date  . AORTIC ARCH ANGIOGRAPHY N/A 09/09/2018   Procedure: AORTIC ARCH ANGIOGRAPHY;  Surgeon: Sherren Kerns, MD;  Location: [facility] INVASIVE CV LAB;  Service: Cardiovascular;  Laterality: N/A;  . CAROTID ANGIOGRAPHY Bilateral 09/09/2018   Procedure: CAROTID ANGIOGRAPHY;  Surgeon: Sherren Kerns, MD;  Location: [facility] INVASIVE CV LAB;  Service: Cardiovascular;  Laterality: Bilateral;  . LOOP RECORDER INSERTION N/A 04/07/2019   Procedure: LOOP RECORDER INSERTION;  Surgeon: Marinus Maw, MD;  Location: [facility] INVASIVE CV LAB;  Service: Cardiovascular;  Laterality: N/A;   No family history on file. Social History:  has no history on file for tobacco, alcohol, and drug. Allergies: No Known Allergies Medications Prior to Admission  Medication Sig Dispense Refill  . allopurinol (ZYLOPRIM) 300 MG tablet Take 300 mg by mouth daily.    Marland Kitchen aspirin EC 81 MG EC tablet Take 1 tablet (81 mg total) by mouth daily. 30 tablet 1  . atorvastatin (LIPITOR) 80 MG tablet Take 1 tablet (80 mg total) by mouth daily at 6 PM. 90 tablet 1  . hydrochlorothiazide (HYDRODIURIL) 50 MG tablet Take 50 mg by mouth daily.    Marland Kitchen lisinopril (PRINIVIL,ZESTRIL) 40 MG tablet Take 40 mg  by mouth daily.    . clopidogrel (PLAVIX) 75 MG tablet Take 1  tablet (75 mg total) by mouth daily. 90 tablet 1  . diclofenac (VOLTAREN) 75 MG EC tablet Take 75 mg by mouth at bedtime as needed for mild pain or moderate pain.      Drug Regimen Review Drug regimen was reviewed and remains appropriate with no significant issues identified  Home: Home Living Family/patient expects to be discharged to:: Unsure Additional Comments: pt with great difficulty speaking but able to nod "yes" and shake head "no" in response to some questions - pt lives with his spouse and was previously independent   Functional History: Prior Function Level of Independence: Needs assistance Comments: Per chart review/old therapy notes, pt was able to ambulate short distances, and used a scooter for longer distances and required assist for ADLs - chart indicates pt is divorced   Functional Status:  Mobility: Bed Mobility Overal bed mobility: Needs Assistance Bed Mobility: Supine to Sit, Sit to Supine Supine to sit: Mod assist, HOB elevated, +2 for physical assistance Sit to supine: Mod assist, +2 for physical assistance General bed mobility comments: mod A at LE's and trunk to rise as well as at hips to scoot to EOB. However, when pt wanted to return to supine, he initiated scooting L and back into bed on his own. Still needed mod A for LE's into bed Transfers Overall transfer level: Needs assistance Equipment used: 2 person hand held assist Transfers: Sit to/from Stand Sit to Stand: Max assist, +2 physical assistance General transfer comment: B knees and feet blocked and max A +2 given but pt achieved only partial stand with legs braced against the bed and posterior lean Ambulation/Gait General Gait Details: unable    ADL: ADL Overall ADL's : Needs assistance/impaired Eating/Feeding: Moderate assistance, Bed level Grooming: Wash/dry hands, Wash/dry face, Oral care, Brushing hair, Moderate assistance, Sitting Grooming Details (indicate cue type and reason): Pt  attempted to brush beard/face with toothbrush when asked to demonstrate use of toothbrush  Upper Body Bathing: Maximal assistance, Sitting Lower Body Bathing: Maximal assistance, Sit to/from stand Upper Body Dressing : Maximal assistance, Sitting Lower Body Dressing: Total assistance, Sit to/from stand Toilet Transfer: Total assistance Toilet Transfer Details (indicate cue type and reason): unable to safely attempt  Toileting- Clothing Manipulation and Hygiene: Total assistance, Sit to/from stand Functional mobility during ADLs: Maximal assistance, +2 for physical assistance, +2 for safety/equipment  Cognition: Cognition Overall Cognitive Status: Difficult to assess Orientation Level: Oriented to person, Oriented to situation, Disoriented to place, Disoriented to time Cognition Arousal/Alertness: Awake/alert Behavior During Therapy: Flat affect Overall Cognitive Status: Difficult to assess Area of Impairment: Following commands, Problem solving Following Commands: Follows one step commands with increased time, Follows one step commands inconsistently Problem Solving: Slow processing, Decreased initiation, Difficulty sequencing, Requires tactile cues General Comments: pt does not respond well to verbal cues, does better with tactile cues and demonstration Difficult to assess due to: Impaired communication  Physical Exam: Blood pressure (!) 116/97, pulse 77, temperature 97.6 F (36.4 C), temperature source Axillary, resp. rate 18, weight 88.8 kg, SpO2 99 %. Physical Exam  Constitutional: No distress.  HENT:  Head: Normocephalic and atraumatic.  Eyes: Pupils are equal, round, and reactive to light. EOM are normal.  Neck: Normal range of motion. No thyromegaly present.  Cardiovascular: Normal rate and regular rhythm.  Respiratory: Effort normal and breath sounds normal. No respiratory distress. He has no wheezes.  o2 via [place]  GI:  Soft. He exhibits no distension. There is no abdominal  tenderness.  Genitourinary:    Genitourinary Comments: Foley tube in place   Neurological: He is alert.  Patient is alert and aphasic. He can provide some short phrases appropriately. Speech dysarthric.  Yes/ no head nodes appropriate. RUE 0/5. RLE approximately 1-2/5 with apraxia. Senses pain in all 4 limbs. DTR's 2+ RUE and RLE.  Skin: Skin is warm and dry. He is not diaphoretic.  Psychiatric: He has a normal mood and affect. His behavior is normal.    Results for orders placed or performed during the hospital encounter of 04/05/19 (from the past 48 hour(s))  Basic metabolic panel     Status: Abnormal   Collection Time: 04/09/19  9:00 AM  Result Value Ref Range   Sodium 142 135 - 145 mmol/L   Potassium 4.3 3.5 - 5.1 mmol/L   Chloride 96 (L) 98 - 111 mmol/L   CO2 32 22 - 32 mmol/L   Glucose, Bld 154 (H) 70 - 99 mg/dL   BUN 161117 (H) 8 - 23 mg/dL   Creatinine, Ser 0.966.01 (H) 0.61 - 1.24 mg/dL   Calcium 8.1 (L) 8.9 - 10.3 mg/dL   GFR calc non Af Amer 9 (L) >60 mL/min   GFR calc Af Amer 10 (L) >60 mL/min   Anion gap 14 5 - 15    Comment: Performed at [facility], [address] [place], [place] [address]  Basic metabolic panel     Status: Abnormal   Collection Time: 04/10/19  5:18 AM  Result Value Ref Range   Sodium 142 135 - 145 mmol/L   Potassium 4.5 3.5 - 5.1 mmol/L   Chloride 97 (L) 98 - 111 mmol/L   CO2 30 22 - 32 mmol/L   Glucose, Bld 120 (H) 70 - 99 mg/dL   BUN 981104 (H) 8 - 23 mg/dL   Creatinine, Ser 1.915.78 (H) 0.61 - 1.24 mg/dL   Calcium 8.2 (L) 8.9 - 10.3 mg/dL   GFR calc non Af Amer 9 (L) >60 mL/min   GFR calc Af Amer 11 (L) >60 mL/min   Anion gap 15 5 - 15    Comment: Performed at [facility], [address] [place], [place] [address]  CBC     Status: Abnormal   Collection Time: 04/10/19  5:18 AM  Result Value Ref Range   WBC 10.9 (H) 4.0 - 10.5 K/uL   RBC 3.58 (L) 4.22 - 5.81 MIL/uL   Hemoglobin 10.0 (L) 13.0 - 17.0 g/dL   HCT 56.233.5 (L) 13.039.0 - 86.552.0 %    MCV 93.6 80.0 - 100.0 fL   MCH 27.9 26.0 - 34.0 pg   MCHC 29.9 (L) 30.0 - 36.0 g/dL   RDW 78.413.4 69.611.5 - 29.515.5 %   Platelets 183 150 - 400 K/uL   nRBC 0.0 0.0 - 0.2 %    Comment: Performed at [facility], [address] [place], [place] [address]   No results found.     Medical Problem List and Plan: 1.  Right hemiparesis and aphasia/dysphagia secondary to left insular and posterior frontal infarct embolic secondary to non-STEMI.status post loop recorder  -admit to inpatient rehab 2.  Antithrombotics: -DVT/anticoagulation:  SCDs  -antiplatelet therapy: aspirin 325 mg daily 3. Pain Management: Tylenol as needed 4. Mood:  Provide emotional support  -antipsychotic agents: N/A 5. Neuropsych: This patient is not capable of making decisions on his own behalf. 6. Skin/Wound  Care:  Routine skin checks  -local care to loop recorder site as needed 7. Fluids/Electrolytes/Nutrition:  Routine in and out's with follow-up chemistries tomorrow.   -encourage PO 8. Dysphagia. Dysphagia #1Honey thick liquids. Monitor hydration. Speech therapy follow-up 9. Multifocal atrial tachycardia/hypertension. Lopressor 25 mg 3 times a day, Procardia 30 mg daily. Follow-up cardiology services 10. 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 Follow-up urology as well as nephrology services.FOLEY TUBE REMAINS IN PLACE UNTIL FOLLOW UP AS OUT PATIENT WITH UROLOGY 11. Hyperlipidemia. Lipitor 12. Morbid obesity/OSA. Continue CPAP     Charlton Amor, PA-C 04/10/2019

## 2019-04-10 NOTE — Progress Notes (Addendum)
Inpatient Rehabilitation-Admissions Coordinator   Pt is now agreeable to CIR. AC will begin insurance authorization process for possible admit.   Please call if questions.   Nanine Means, OTR/L  Rehab Admissions Coordinator  [phone] 04/10/2019 10:26 AM'

## 2019-04-10 NOTE — Progress Notes (Signed)
Pt had 11 beat run of V-tach, denies any pain, no SOB, no dizziness, VS WNL, no complaints from patient, no distress. MD notified. [name]

## 2019-04-10 NOTE — Progress Notes (Signed)
   Chart review notes plan for patient to go to CIR when medically stable. Please contact [facility] once ready to dc from CIR for [place] to arrange for appropriate outpatient follow up. If plan for discharge home instead please inform.   SignedLaverda Page, NP-C 04/10/2019, 1:13 PM Pager: [phone]

## 2019-04-10 NOTE — H&P (Signed)
Physical Medicine and Rehabilitation Admission H&P     Chief complaint: patient is aphasic   HPI: Gregory [name] is a 68 year old right-handed male with history of chronic kidney disease stage III with creatinine baseline 1.6-1.7, hypertension maintained on HCTZ and lisinopril, hyperlipidemia. Per chart review patient lives with spouse reportedly independent prior to admission. Presented 04/05/2019 with acute onset of right sided weakness and slurred speech. Cranial CT scan showed possible early gray- white differentiation loss in the insula on the left. Chronic small vessel ischemic changes. MRI/MRA showed a 3.5 centimeter region of acute infarction affecting the left insula and posterior frontal region. No evidence of gross hemorrhage. MRA negative. Patient did not receive TPA. Noted findings of hyperkalemia 6.8 as well as elevated creatinine 10.4 rate from baseline 1.70, troponin 3.29. EKG with ST changes. Carotid Dopplers 40-59% ICA stenosis bilaterally.Echocardiogram with ejection fraction of 60% and normal systolic function.  Cardiology service is consulted for suspect non-STEMI medical management recommended. Patient did undergo placement of loop recorder 04/07/2019 per Dr. Ladona Ridgel. Renal ultrasound showed bilateral hydronephrosis with nephrology services consulted as well as urology. A Foley catheter tube was placed and would remain in place until follow-up outpatient urology.renal function continues to improve 5.78 creatinine 04/10/2019. Maintained on aspirin for CVA prophylaxis. Dysphagia #1 honey thick liquid diet. Therapy evaluations completed with recommendations of physical medicine rehabilitation consult. Patient was admitted for a comprehensive rehabilitation program.   Review of Systems  Constitutional: Negative for chills and fever.  HENT: Negative for hearing loss.   Eyes: Negative for double vision.  Respiratory: Negative for cough and shortness of breath.   Cardiovascular:  Positive for palpitations and leg swelling. Negative for chest pain.  Gastrointestinal: Positive for constipation. Negative for heartburn and vomiting.  Genitourinary: Negative for dysuria, flank pain and hematuria.  Musculoskeletal: Positive for myalgias.  Skin: Negative for rash.  Neurological: Positive for weakness. Negative for seizures.  All other systems reviewed and are negative.       Past Medical History:  Diagnosis Date  . AKI (acute kidney injury) (HCC)    . CKD (chronic kidney disease) stage 2, GFR 60-89 ml/min    . CVA (cerebral vascular accident) (HCC)    . Hyperlipidemia    . Hypertension    . PVD (peripheral vascular disease) (HCC)           Past Surgical History:  Procedure Laterality Date  . AORTIC ARCH ANGIOGRAPHY N/A 09/09/2018    Procedure: AORTIC ARCH ANGIOGRAPHY;  Surgeon: Sherren Kerns, MD;  Location: [facility] INVASIVE CV LAB;  Service: Cardiovascular;  Laterality: N/A;  . CAROTID ANGIOGRAPHY Bilateral 09/09/2018    Procedure: CAROTID ANGIOGRAPHY;  Surgeon: Sherren Kerns, MD;  Location: [facility] INVASIVE CV LAB;  Service: Cardiovascular;  Laterality: Bilateral;  . LOOP RECORDER INSERTION N/A 04/07/2019    Procedure: LOOP RECORDER INSERTION;  Surgeon: Marinus Maw, MD;  Location: [facility] INVASIVE CV LAB;  Service: Cardiovascular;  Laterality: N/A;    No family history on file. Social History:  has no history on file for tobacco, alcohol, and drug. Allergies: No Known Allergies Medications Prior to Admission  Medication Sig Dispense Refill  . allopurinol (ZYLOPRIM) 300 MG tablet Take 300 mg by mouth daily.      Marland Kitchen aspirin EC 81 MG EC tablet Take 1 tablet (81 mg total) by mouth daily. 30 tablet 1  . atorvastatin (LIPITOR) 80 MG tablet Take 1 tablet (80 mg total) by mouth daily at 6 PM.  90 tablet 1  . hydrochlorothiazide (HYDRODIURIL) 50 MG tablet Take 50 mg by mouth daily.      Marland Kitchen lisinopril (PRINIVIL,ZESTRIL) 40 MG tablet Take 40 mg by mouth daily.      . clopidogrel  (PLAVIX) 75 MG tablet Take 1 tablet (75 mg total) by mouth daily. 90 tablet 1  . diclofenac (VOLTAREN) 75 MG EC tablet Take 75 mg by mouth at bedtime as needed for mild pain or moderate pain.          Drug Regimen Review Drug regimen was reviewed and remains appropriate with no significant issues identified   Home: Home Living Family/patient expects to be discharged to:: Unsure Additional Comments: pt with great difficulty speaking but able to nod "yes" and shake head "no" in response to some questions - pt lives with his spouse and was previously independent   Functional History: Prior Function Level of Independence: Needs assistance Comments: Per chart review/old therapy notes, pt was able to ambulate short distances, and used a scooter for longer distances and required assist for ADLs - chart indicates pt is divorced    Functional Status:  Mobility: Bed Mobility Overal bed mobility: Needs Assistance Bed Mobility: Supine to Sit, Sit to Supine Supine to sit: Mod assist, HOB elevated, +2 for physical assistance Sit to supine: Mod assist, +2 for physical assistance General bed mobility comments: mod A at LE's and trunk to rise as well as at hips to scoot to EOB. However, when pt wanted to return to supine, he initiated scooting L and back into bed on his own. Still needed mod A for LE's into bed Transfers Overall transfer level: Needs assistance Equipment used: 2 person hand held assist Transfers: Sit to/from Stand Sit to Stand: Max assist, +2 physical assistance General transfer comment: B knees and feet blocked and max A +2 given but pt achieved only partial stand with legs braced against the bed and posterior lean Ambulation/Gait General Gait Details: unable   ADL: ADL Overall ADL's : Needs assistance/impaired Eating/Feeding: Moderate assistance, Bed level Grooming: Wash/dry hands, Wash/dry face, Oral care, Brushing hair, Moderate assistance, Sitting Grooming Details  (indicate cue type and reason): Pt attempted to brush beard/face with toothbrush when asked to demonstrate use of toothbrush  Upper Body Bathing: Maximal assistance, Sitting Lower Body Bathing: Maximal assistance, Sit to/from stand Upper Body Dressing : Maximal assistance, Sitting Lower Body Dressing: Total assistance, Sit to/from stand Toilet Transfer: Total assistance Toilet Transfer Details (indicate cue type and reason): unable to safely attempt  Toileting- Clothing Manipulation and Hygiene: Total assistance, Sit to/from stand Functional mobility during ADLs: Maximal assistance, +2 for physical assistance, +2 for safety/equipment   Cognition: Cognition Overall Cognitive Status: Difficult to assess Orientation Level: Oriented to person, Oriented to situation, Disoriented to place, Disoriented to time Cognition Arousal/Alertness: Awake/alert Behavior During Therapy: Flat affect Overall Cognitive Status: Difficult to assess Area of Impairment: Following commands, Problem solving Following Commands: Follows one step commands with increased time, Follows one step commands inconsistently Problem Solving: Slow processing, Decreased initiation, Difficulty sequencing, Requires tactile cues General Comments: pt does not respond well to verbal cues, does better with tactile cues and demonstration Difficult to assess due to: Impaired communication   Physical Exam: Blood pressure (!) 116/97, pulse 77, temperature 97.6 F (36.4 C), temperature source Axillary, resp. rate 18, weight 88.8 kg, SpO2 99 %. Physical Exam  Constitutional: No distress.  HENT:  Head: Normocephalic and atraumatic.  Eyes: Pupils are equal, round, and reactive to light. EOM  are normal.  Neck: Normal range of motion. No thyromegaly present.  Cardiovascular: Normal rate and regular rhythm.  Respiratory: Effort normal and breath sounds normal. No respiratory distress. He has no wheezes.  o2 via   GI: Soft. He exhibits  no distension. There is no abdominal tenderness.  Genitourinary:    Genitourinary Comments: Foley tube in place   Neurological: He is alert.  Patient is alert and aphasic. He can provide some short phrases appropriately. Speech dysarthric.  Yes/ no head nodes appropriate. RUE 0/5. RLE approximately 1-2/5 with apraxia. Senses pain in all 4 limbs. DTR's 2+ RUE and RLE.  Skin: Skin is warm and dry. He is not diaphoretic.  Psychiatric: He has a normal mood and affect. His behavior is normal.      Lab Results Last 48 Hours        Results for orders placed or performed during the hospital encounter of 04/05/19 (from the past 48 hour(s))  Basic metabolic panel     Status: Abnormal    Collection Time: 04/09/19  9:00 AM  Result Value Ref Range    Sodium 142 135 - 145 mmol/L    Potassium 4.3 3.5 - 5.1 mmol/L    Chloride 96 (L) 98 - 111 mmol/L    CO2 32 22 - 32 mmol/L    Glucose, Bld 154 (H) 70 - 99 mg/dL    BUN 161117 (H) 8 - 23 mg/dL    Creatinine, Ser 0.966.01 (H) 0.61 - 1.24 mg/dL    Calcium 8.1 (L) 8.9 - 10.3 mg/dL    GFR calc non Af Amer 9 (L) >60 mL/min    GFR calc Af Amer 10 (L) >60 mL/min    Anion gap 14 5 - 15      Comment: Performed at [facility], [address] [place], [place] [address]  Basic metabolic panel     Status: Abnormal    Collection Time: 04/10/19  5:18 AM  Result Value Ref Range    Sodium 142 135 - 145 mmol/L    Potassium 4.5 3.5 - 5.1 mmol/L    Chloride 97 (L) 98 - 111 mmol/L    CO2 30 22 - 32 mmol/L    Glucose, Bld 120 (H) 70 - 99 mg/dL    BUN 981104 (H) 8 - 23 mg/dL    Creatinine, Ser 1.915.78 (H) 0.61 - 1.24 mg/dL    Calcium 8.2 (L) 8.9 - 10.3 mg/dL    GFR calc non Af Amer 9 (L) >60 mL/min    GFR calc Af Amer 11 (L) >60 mL/min    Anion gap 15 5 - 15      Comment: Performed at [facility], [address] [place], [place] [address]  CBC     Status: Abnormal    Collection Time: 04/10/19  5:18 AM  Result Value Ref Range    WBC 10.9 (H) 4.0 - 10.5 K/uL     RBC 3.58 (L) 4.22 - 5.81 MIL/uL    Hemoglobin 10.0 (L) 13.0 - 17.0 g/dL    HCT 56.233.5 (L) 13.039.0 - 52.0 %    MCV 93.6 80.0 - 100.0 fL    MCH 27.9 26.0 - 34.0 pg    MCHC 29.9 (L) 30.0 - 36.0 g/dL    RDW 86.513.4 78.411.5 - 69.615.5 %    Platelets 183 150 - 400 K/uL    nRBC 0.0 0.0 - 0.2 %      Comment: Performed at [facility], [address] [place], [place] [address]      Imaging Results (Last 48 hours)  No results found.           Medical Problem List and Plan: 1.  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  Antithrombotics: -DVT/anticoagulation:  SCDs             -antiplatelet therapy: aspirin 325 mg daily 3. Pain Management: Tylenol as needed 4. Mood:  Provide emotional support             -antipsychotic agents: N/A 5. Neuropsych: This patient is not capable of making decisions on his own behalf. 6. Skin/Wound Care:  Routine skin checks             -local care to loop recorder site as needed 7. Fluids/Electrolytes/Nutrition:  Routine in and out's with follow-up chemistries tomorrow.              -encourage PO 8. Dysphagia. Dysphagia #1Honey thick liquids. Monitor hydration. Speech therapy follow-up 9. Multifocal atrial tachycardia/hypertension. Lopressor 25 mg 3 times a day, Procardia 30 mg daily. Follow-up cardiology services 10. 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 Follow-up urology as well as nephrology services.FOLEY TUBE REMAINS IN PLACE UNTIL FOLLOW UP AS OUT PATIENT WITH UROLOGY 11. Hyperlipidemia. Lipitor 12. Morbid obesity/OSA. Continue CPAP    Post Admission Physician Evaluation: 1. Functional deficits secondary  to Left insular/posterior-frontal infarct. 2. Patient is admitted to receive collaborative, interdisciplinary care between the physiatrist, rehab nursing staff, and therapy team. 3. Patient's  level of medical complexity and substantial therapy needs in context of that medical necessity cannot be provided at a lesser intensity of care such as a SNF. 4. Patient has experienced substantial functional loss from his/her baseline which was documented above under the "Functional History" and "Functional Status" headings.  Judging by the patient's diagnosis, physical exam, and functional history, the patient has potential for functional progress which will result in measurable gains while on inpatient rehab.  These gains will be of substantial and practical use upon discharge  in facilitating mobility and self-care at the household level. 5. Physiatrist will provide 24 hour management of medical needs as well as oversight of the therapy plan/treatment and provide guidance as appropriate regarding the interaction of the two. 6. The Preadmission Screening has been reviewed and patient status is unchanged unless otherwise stated above. 7. 24 hour rehab nursing will assist with bladder management, bowel management, safety, skin/wound care, disease management, medication administration, pain management and patient education  and help integrate therapy concepts, techniques,education, etc. 8. PT will assess and treat for/with: Lower extremity strength, range of motion, stamina, balance, functional mobility, safety, adaptive techniques and equipment, NMR, family education.   Goals are: mod I to supervision. 9. OT will assess and treat for/with: ADL's, functional mobility, safety, upper extremity strength, adaptive techniques and equipment, NMR, orthotics, family ed.   Goals are: mod I to min assist. Therapy may proceed with showering this patient. 10. SLP will assess and treat for/with: ADL's, functional mobility, safety, upper extremity strength, adaptive techniques and equipment, .  Goals are: supervision to mod assist. 11. Case Management and Social Worker will assess and treat for psychological issues and  discharge planning. 12. Team conference will be held weekly to assess progress toward goals and to determine  barriers to discharge. 13. Patient will receive at least 3 hours of therapy per day at least 5 days per week. 14. ELOS: 13-20 days       15. Prognosis:  excellent   I have personally performed a face to face diagnostic evaluation of this patient and formulated the key components of the plan.  Additionally, I have personally reviewed laboratory data, imaging studies, as well as relevant notes and concur with the physician assistant's documentation above.  Ranelle Oyster, MD, FAAPMR     Mcarthur Rossetti Angiulli, PA-C 04/10/2019

## 2019-04-10 NOTE — Discharge Summary (Signed)
Gregory [name], is a 68 y.o. male  DOB February 23, 1951  MRN [id number].  Admission date:  04/05/2019  Admitting Physician  Costin Otelia Sergeant, MD  Discharge Date:  04/10/2019   Primary MD  System, Pcp Not In  Recommendations for primary care physician for things to follow:  Please get daily BMP   Loop recorder implant wound/site care instructions Keep incision clean and dry for 3 days. Leave steri-strips (little pieces of tape) on until seen in the office for wound check appointment. Call the office [phone]) for redness, drainage, swelling, or fever.   D/c Instructions 1)BMP daily--- please have nephrology follow patient while at inpatient rehab Please get daily BMP   2) bladder outlet obstruction/obstructive uropathy--- please keep Foley catheter in place, do not remove please--- patient will need urology follow-up post discharge  3) please consider speech reevaluation  4)Avoid Nsaids    Admission Diagnosis  Hyperkalemia [E87.5] AKI (acute kidney injury) (HCC) [N17.9] Acute renal failure, unspecified acute renal failure type (HCC) [N17.9] Altered behavior [R46.89]   Discharge Diagnosis  Hyperkalemia [E87.5] AKI (acute kidney injury) (HCC) [N17.9] Acute renal failure, unspecified acute renal failure type (HCC) [N17.9] Altered behavior [R46.89]    Principal Problem:   Stroke (cerebrum) (HCC) Active Problems:   Pre-diabetes   CKD (chronic kidney disease) stage 3, GFR 30-59 ml/min (HCC)   Essential hypertension   Demand ischemia (HCC)   Multifocal atrial tachycardia (HCC)   Cerebral infarction due to embolism of left middle cerebral artery (HCC)      Past Medical History:  Diagnosis Date   AKI (acute kidney injury) (HCC)    CKD (chronic kidney disease) stage 2, GFR 60-89 ml/min    CVA (cerebral vascular accident) (HCC)    Hyperlipidemia    Hypertension    PVD (peripheral  vascular disease) (HCC)     Past Surgical History:  Procedure Laterality Date   AORTIC ARCH ANGIOGRAPHY N/A 09/09/2018   Procedure: AORTIC ARCH ANGIOGRAPHY;  Surgeon: Sherren Kerns, MD;  Location: [facility];  Service: Cardiovascular;  Laterality: N/A;   CAROTID ANGIOGRAPHY Bilateral 09/09/2018   Procedure: CAROTID ANGIOGRAPHY;  Surgeon: Sherren Kerns, MD;  Location: [facility];  Service: Cardiovascular;  Laterality: Bilateral;   LOOP RECORDER INSERTION N/A 04/07/2019   Procedure: LOOP RECORDER INSERTION;  Surgeon: Marinus Maw, MD;  Location: [facility];  Service: Cardiovascular;  Laterality: N/A;       HPI  from the history and physical done on the day of admission:    Chief Complaint: Sudden onset right-sided weakness  HPI: Gregory [name] is a 68 y.o. male with medical history significant of chronic kidney disease stage III with baseline creatinine 1.6-1.7, hypertension, hyperlipidemia, who presents to the hospital with chief complaint of sudden onset right-sided weakness and inability to talk.  Patient is unable to talk to me so HPI as per ED report, basically patient was in his prior normal state of health apparently up until this morning when he was  found unresponsive on the floor, unable to talk, unable to move his right side.  911 was called and he was brought to the emergency room.  Patient seems understand my question and is able to shake his head yes or no, he denies being in any pain right now, he denies any shortness of breath, he denies any fevers prior to this presentation.  ED Course: In the ED his vital signs are stable, he is afebrile normotensive.  He is satting well on room air.  Blood work revealed a creatinine of 10.8, BUN 158, potassium 6.8.  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  Neurology and nephrology were  consulted and we are asked to admit  Review of Systems: As per HPI otherwise 10 point review of systems negative.     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 Dopplers without hemodynamically significant stenosis, status post loop recorder implantation for possible A. fib by Dr. Ladona Ridgel on 04/07/2019, LDL is 110, A1c is 6.0, MRA head and neck without large vessel occlusion. PTA patient was on aspirin and Plavix, now on aspirin 325 mg daily, Lipitor 80 mg daily, neurologist recommends discharge back on aspirin and Plavix.  A1c 6.0  2)NSTEMI with peak troponin I of 17.34--- echo with EF of 55 to 60%, cardiology was input appreciated, continue metoprolol and Lipitor , currently chest pain-free , per cardiologist no invasive work-up planned continue medical management.  Aspirin and Plavix as above #1  3)Multifocal atrial tachycardia-cardiology input appreciated, change metoprolol to 25 mg p.o. 3 times daily as BP allows  4)Acute on CKD III with obstructive uropathy. Creatinine is down to 5.78 (creatinine peaked at 10.48, BUN peaked at 158).  Baseline creatinine 1.6 to 1.7.  patient has bilateral hydronephrosis due to chronic bladder outlet obstruction, nephrology input appreciated,  maintain adequate hydration.  May need IV fluids/dextrose in water from time to time to avoid free water deficit Please get daily BMP and please reconsult nephrologist if creatinine starts to trend back up    5)FEN/hypernatremia --- improving, sodium is down to 142 , patient with free water deficit, speech pathologist consult appreciated, recommend Dysphagia 1 (Puree) solids Liquid Administration via Spoon Medication Administration Crushed with puree....  6)HTN--initially permissive hypertension was allowed, goal is now normotensive blood pressure  , change metoprolol to 25 mg p.o. 3 times daily stop Procardia XL  to avoid hypotension,   7)Obesity with OSA--- continue CPAP nightly  8)BPH with LUTs--- patient has bilateral hydronephrosis due to chronic bladder outlet obstruction continue Foley , patient should follow-up with urologist in  3 to 4 weeks \  9)Hematuria and Possible UTI--- treated empirically with IV Rocephin, Foley remains in place  10)expressive aphasia: Evaluated by  speech therapist and started on dysphagia 1 diet/honey thick liquids.  Recommend repeat speech evaluation in the next 2 to 4 days  Code Status : full  Family Communication:   na   Disposition Plan  :  InPatient rehab  Consults  : Neurology/urology/cardiology//nephrology   Discharge Condition: stable  Follow UP  Follow-up Information    [facility] Office Follow up.   Specialty:  Cardiology Why:  04/17/2019 @ 11:00AM, wound check visit. (monitor implant) Contact information: [address], [address] [place] [address] [phone]           Diet and Activity recommendation:  As advised  Discharge Instructions    Discharge Instructions    Diet - low sodium heart healthy   Complete by:  As directed    Discharge instructions   Complete by:  As directed    1)BMP daily--- please have nephrology follow patient while at inpatient rehab  2) bladder  outlet obstruction/obstructive uropathy--- please keep Foley catheter in place, do not remove please--- patient will need urology follow-up post discharge  3) please consider speech reevaluation  4)Avoid Nsaids   Increase activity slowly   Complete by:  As directed         Discharge Medications     Allergies as of 04/10/2019   No Known Allergies     Medication List    STOP taking these medications   diclofenac 75 MG EC tablet Commonly known as:  VOLTAREN   hydrochlorothiazide 50 MG tablet Commonly known as:  HYDRODIURIL   lisinopril 40 MG tablet Commonly known as:  ZESTRIL     TAKE these medications   acetaminophen 325 MG tablet Commonly known as:  TYLENOL Take 2 tablets (650 mg total) by mouth every 4 (four) hours as needed for mild pain, fever or headache.   allopurinol 300 MG tablet Commonly known as:  ZYLOPRIM Take 300 mg by mouth daily.   aspirin EC 81 MG tablet Take 1 tablet (81 mg total) by mouth daily with breakfast. What changed:  when to take this   atorvastatin 80 MG tablet Commonly known as:  LIPITOR Take 1 tablet (80 mg total) by mouth daily at 6 PM.   clopidogrel 75 MG tablet Commonly known as:  Plavix Take 1 tablet (75 mg total) by mouth daily.   metoprolol tartrate 25 MG tablet Commonly known as:  LOPRESSOR Take 1 tablet (25 mg total) by mouth 3 (three) times daily.       Major procedures and Radiology Reports - PLEASE review detailed and final reports for all details, in brief -   [name] ZD Contrast  Result Date: 04/05/2019 CLINICAL DATA:  Acute presentation with right-sided weakness and speech disturbance. EXAM: MRI HEAD WITHOUT CONTRAST MRA HEAD WITHOUT CONTRAST MRA NECK WITHOUT CONTRAST TECHNIQUE: Multiplanar, multiecho pulse sequences of the brain and surrounding structures were obtained without intravenous contrast. Angiographic images of the Circle of Willis were obtained using MRA technique without intravenous contrast.  Angiographic images of the neck were obtained using MRA technique without intravenous contrast. Carotid stenosis measurements (when applicable) are obtained utilizing NASCET criteria, using the distal internal carotid diameter as the denominator. COMPARISON:  Head CT same day.  MRI 09/03/2018. FINDINGS: MRI HEAD FINDINGS Brain: Diffusion imaging confirms acute infarction in the left insula and posterior frontal region. The area of involvement measures about 3.5 cm in size. No mass effect or visible hemorrhage. No hydrocephalus. Chronic small-vessel ischemic changes of the deep  white matter seen elsewhere. Vascular: Major vessels at the base of the brain show flow. Skull and upper cervical spine: Negative Sinuses/Orbits: Clear/normal Other: None MRA HEAD FINDINGS Both internal carotid arteries are patent through the skull base and siphon regions with atherosclerotic narrowing and irregularity in the siphon regions. Supraclinoid internal carotid arteries are widely patent. The anterior and middle cerebral vessels appear to show flow without visible large or medium vessel occlusion. Distal branch vessels do show some atherosclerotic irregularity. The left vertebral artery supplies the basilar. No antegrade flow seen in the right vertebral artery. The basilar artery shows atherosclerotic narrowing and irregularity diffusely. Posterior circulation branch vessels are patent, with both posterior cerebral arteries receiving most of there supply from the anterior circulation. There is long segment stenosis of the left P1 segment, but good flow seen distal to that. MRA NECK FINDINGS There is antegrade flow in both common carotid arteries. The study suffers from pronounced motion degradation. Antegrade flow is seen in both cervical internal carotid arteries, but accurate evaluation of the carotid bifurcations is not possible. There is antegrade flow in the left vertebral artery but none seen on the right. IMPRESSION: 3.5 cm  region of acute infarction affecting the left insula and posterior frontal region. No evidence of gross hemorrhage. MR angiography of the neck markedly degraded by motion. Both internal carotid arteries show antegrade flow. No antegrade flow in the right vertebral artery. Intracranial MR angiography does not show any large or medium vessel occlusion. Widespread atherosclerotic irregularity of the more distal branch vessels. Electronically Signed   By: Paulina Fusi M.D.   On: 04/05/2019 10:59   [name] Neck Wo Contrast  Result Date: 04/05/2019 CLINICAL DATA:  Acute presentation with right-sided weakness and speech disturbance. EXAM: MRI HEAD WITHOUT CONTRAST MRA HEAD WITHOUT CONTRAST MRA NECK WITHOUT CONTRAST TECHNIQUE: Multiplanar, multiecho pulse sequences of the brain and surrounding structures were obtained without intravenous contrast. Angiographic images of the Circle of Willis were obtained using MRA technique without intravenous contrast. Angiographic images of the neck were obtained using MRA technique without intravenous contrast. Carotid stenosis measurements (when applicable) are obtained utilizing NASCET criteria, using the distal internal carotid diameter as the denominator. COMPARISON:  Head CT same day.  MRI 09/03/2018. FINDINGS: MRI HEAD FINDINGS Brain: Diffusion imaging confirms acute infarction in the left insula and posterior frontal region. The area of involvement measures about 3.5 cm in size. No mass effect or visible hemorrhage. No hydrocephalus. Chronic small-vessel ischemic changes of the deep white matter seen elsewhere. Vascular: Major vessels at the base of the brain show flow. Skull and upper cervical spine: Negative Sinuses/Orbits: Clear/normal Other: None MRA HEAD FINDINGS Both internal carotid arteries are patent through the skull base and siphon regions with atherosclerotic narrowing and irregularity in the siphon regions. Supraclinoid internal carotid arteries are widely patent.  The anterior and middle cerebral vessels appear to show flow without visible large or medium vessel occlusion. Distal branch vessels do show some atherosclerotic irregularity. The left vertebral artery supplies the basilar. No antegrade flow seen in the right vertebral artery. The basilar artery shows atherosclerotic narrowing and irregularity diffusely. Posterior circulation branch vessels are patent, with both posterior cerebral arteries receiving most of there supply from the anterior circulation. There is long segment stenosis of the left P1 segment, but good flow seen distal to that. MRA NECK FINDINGS There is antegrade flow in both common carotid arteries. The study suffers from pronounced motion degradation. Antegrade flow is seen in both cervical internal  carotid arteries, but accurate evaluation of the carotid bifurcations is not possible. There is antegrade flow in the left vertebral artery but none seen on the right. IMPRESSION: 3.5 cm region of acute infarction affecting the left insula and posterior frontal region. No evidence of gross hemorrhage. MR angiography of the neck markedly degraded by motion. Both internal carotid arteries show antegrade flow. No antegrade flow in the right vertebral artery. Intracranial MR angiography does not show any large or medium vessel occlusion. Widespread atherosclerotic irregularity of the more distal branch vessels. Electronically Signed   By: Paulina FusiMark  Shogry M.D.   On: 04/05/2019 10:59   Mr Brain Wo Contrast  Result Date: 04/05/2019 CLINICAL DATA:  Acute presentation with right-sided weakness and speech disturbance. EXAM: MRI HEAD WITHOUT CONTRAST MRA HEAD WITHOUT CONTRAST MRA NECK WITHOUT CONTRAST TECHNIQUE: Multiplanar, multiecho pulse sequences of the brain and surrounding structures were obtained without intravenous contrast. Angiographic images of the Circle of Willis were obtained using MRA technique without intravenous contrast. Angiographic images of the  neck were obtained using MRA technique without intravenous contrast. Carotid stenosis measurements (when applicable) are obtained utilizing NASCET criteria, using the distal internal carotid diameter as the denominator. COMPARISON:  Head CT same day.  MRI 09/03/2018. FINDINGS: MRI HEAD FINDINGS Brain: Diffusion imaging confirms acute infarction in the left insula and posterior frontal region. The area of involvement measures about 3.5 cm in size. No mass effect or visible hemorrhage. No hydrocephalus. Chronic small-vessel ischemic changes of the deep white matter seen elsewhere. Vascular: Major vessels at the base of the brain show flow. Skull and upper cervical spine: Negative Sinuses/Orbits: Clear/normal Other: None MRA HEAD FINDINGS Both internal carotid arteries are patent through the skull base and siphon regions with atherosclerotic narrowing and irregularity in the siphon regions. Supraclinoid internal carotid arteries are widely patent. The anterior and middle cerebral vessels appear to show flow without visible large or medium vessel occlusion. Distal branch vessels do show some atherosclerotic irregularity. The left vertebral artery supplies the basilar. No antegrade flow seen in the right vertebral artery. The basilar artery shows atherosclerotic narrowing and irregularity diffusely. Posterior circulation branch vessels are patent, with both posterior cerebral arteries receiving most of there supply from the anterior circulation. There is long segment stenosis of the left P1 segment, but good flow seen distal to that. MRA NECK FINDINGS There is antegrade flow in both common carotid arteries. The study suffers from pronounced motion degradation. Antegrade flow is seen in both cervical internal carotid arteries, but accurate evaluation of the carotid bifurcations is not possible. There is antegrade flow in the left vertebral artery but none seen on the right. IMPRESSION: 3.5 cm region of acute infarction  affecting the left insula and posterior frontal region. No evidence of gross hemorrhage. MR angiography of the neck markedly degraded by motion. Both internal carotid arteries show antegrade flow. No antegrade flow in the right vertebral artery. Intracranial MR angiography does not show any large or medium vessel occlusion. Widespread atherosclerotic irregularity of the more distal branch vessels. Electronically Signed   By: Paulina FusiMark  Shogry M.D.   On: 04/05/2019 10:59   [place] Renal  Result Date: 04/05/2019 CLINICAL DATA:  68 year old male with hypertension EXAM: RENAL / URINARY TRACT ULTRASOUND COMPLETE COMPARISON:  None. FINDINGS: Right Kidney: Length: 11.1 cm x 5.3 cm x 6.1 cm, 189 cc. Hydronephrosis. Flow confirmed in the hilum of the right kidney. Left Kidney: Length: 10.9 cm x 5.6 cm x 5.9 cm, 187 cc. Hydronephrosis. Flow confirmed in  the hilum of the left kidney. Anechoic cystic structure on the inferior cortex measures 2.6 cm x 1.7 cm with through transmission and no internal color flow. Additional cystic lesion measures 1.1 cm x 1.1 cm x 1.0 cm, with through transmission and no internal flow. Bladder: Catheter in place within the bladder. IMPRESSION: Bilateral hydronephrosis. If there is concern for distal obstruction, CT may be indicated. Left-sided renal cysts. Electronically Signed   By: Gilmer Mor D.O.   On: 04/05/2019 15:39   Dg Chest Port 1 View  Result Date: 04/05/2019 CLINICAL DATA:  Altered behavior. EXAM: PORTABLE CHEST 1 VIEW COMPARISON:  None. FINDINGS: Lungs are adequately inflated and otherwise clear. Borderline cardiomegaly. Mediastinum is within normal. Fusion hardware over the lower cervical spine intact. Minimal degenerative change of the spine. IMPRESSION: No acute findings. Electronically Signed   By: Elberta Fortis M.D.   On: 04/05/2019 11:32   Dg Swallowing Func-speech Pathology  Result Date: 04/07/2019 Objective Swallowing Evaluation: Type of Study: MBS-Modified Barium Swallow  Study  Patient Details Name: Jare Bigos MRN: [id number] Date of Birth: 08/22/1951 Today's Date: 04/07/2019 Time: SLP Start Time (ACUTE ONLY): 1140 -SLP Stop Time (ACUTE ONLY): 1200 SLP Time Calculation (min) (ACUTE ONLY): 20 min Past Medical History: Past Medical History: Diagnosis Date  AKI (acute kidney injury) (HCC)   CKD (chronic kidney disease) stage 2, GFR 60-89 ml/min   CVA (cerebral vascular accident) (HCC)   Hyperlipidemia   Hypertension   PVD (peripheral vascular disease) (HCC)  Past Surgical History: Past Surgical History: Procedure Laterality Date  AORTIC ARCH ANGIOGRAPHY N/A 09/09/2018  Procedure: AORTIC ARCH ANGIOGRAPHY;  Surgeon: Sherren Kerns, MD;  Location: [facility];  Service: Cardiovascular;  Laterality: N/A;  CAROTID ANGIOGRAPHY Bilateral 09/09/2018  Procedure: CAROTID ANGIOGRAPHY;  Surgeon: Sherren Kerns, MD;  Location: [facility];  Service: Cardiovascular;  Laterality: Bilateral; HPI: Pt is a 68 y.o. male with medical history significant of chronic kidney disease stage III, hypertension, hyperlipidemia, who presented to the hospital with chief complaint of sudden onset right-sided weakness and inability to talk.  MRI of the brain revealed 3.5 cm region of acute infarction affecting the left insula and posterior frontal region.  No data recorded Assessment / Plan / Recommendation CHL IP CLINICAL IMPRESSIONS 04/07/2019 Clinical Impression Pt presents with a severe oral dysphagia with CN VII and XII weakness impacting labial seal and lingual manipulation of the bolus. There is severe anterior spillage and pooling the right buccal cavity. Pt needs increased time and posterior head tilt to gather and orally transit boluses via slight pumping and passive spillage.  Palatal elevation is weak, does not occur until swallow triggered. There is a plate on the cervical spine starting at C3 causing protrusion of the posterior pharyngeal wall and deflecting delayed nectar  liquids into the vestibule. Due to pts body habitus it is very difficult to see the glottis and subglottis. Penetration is visible, strongly suspect airway protection, but cannot confirm. No cough was elicited, but pt could attempt a throat clear. Best textures are honey thick liquids via spoon and puree. Oral suction, second swallows and intermittent throat clearing will be beneficial to clear oral and base of tongue residue post swallow. Hope for increasing automaticity and strength with diet. Meds will need to be crushed.   SLP Visit Diagnosis Dysphagia, oropharyngeal phase (R13.12) Attention and concentration deficit following -- Frontal lobe and executive function deficit following -- Impact on safety and function Severe aspiration risk  CHL IP TREATMENT RECOMMENDATION 04/07/2019 Treatment Recommendations Therapy as outlined in treatment plan below   No flowsheet data found. CHL IP DIET RECOMMENDATION 04/07/2019 SLP Diet Recommendations Dysphagia 1 (Puree) solids Liquid Administration via Spoon Medication Administration Crushed with puree Compensations Slow rate;Small sips/bites;Monitor for anterior loss;Lingual sweep for clearance of pocketing;Multiple dry swallows after each bite/sip;Clear throat intermittently Postural Changes Seated upright at 90 degrees   CHL IP OTHER RECOMMENDATIONS 04/07/2019 Recommended Consults -- Oral Care Recommendations -- Other Recommendations Have oral suction available   CHL IP FOLLOW UP RECOMMENDATIONS 04/07/2019 Follow up Recommendations Inpatient Rehab   CHL IP FREQUENCY AND DURATION 04/07/2019 Speech Therapy Frequency (ACUTE ONLY) min 2x/week Treatment Duration 2 weeks      CHL IP ORAL PHASE 04/07/2019 Oral Phase Impaired Oral - Pudding Teaspoon -- Oral - Pudding Cup -- Oral - Honey Teaspoon Decreased bolus cohesion;Premature spillage;Delayed oral transit;Lingual/palatal residue;Right pocketing in lateral sulci;Reduced posterior propulsion;Lingual pumping;Weak lingual  manipulation;Right anterior bolus loss Oral - Honey Cup -- Oral - Nectar Teaspoon Decreased bolus cohesion;Premature spillage;Delayed oral transit;Lingual/palatal residue;Right pocketing in lateral sulci;Reduced posterior propulsion;Lingual pumping;Weak lingual manipulation;Right anterior bolus loss Oral - Nectar Cup Decreased bolus cohesion;Premature spillage;Delayed oral transit;Lingual/palatal residue;Right pocketing in lateral sulci;Reduced posterior propulsion;Lingual pumping;Weak lingual manipulation;Right anterior bolus loss Oral - Nectar Straw -- Oral - Thin Teaspoon -- Oral - Thin Cup -- Oral - Thin Straw -- Oral - Puree Decreased bolus cohesion;Premature spillage;Delayed oral transit;Lingual/palatal residue;Right pocketing in lateral sulci;Reduced posterior propulsion;Lingual pumping;Weak lingual manipulation;Right anterior bolus loss Oral - Mech Soft -- Oral - Regular -- Oral - Multi-Consistency -- Oral - Pill -- Oral Phase - Comment --  CHL IP PHARYNGEAL PHASE 04/07/2019 Pharyngeal Phase Impaired Pharyngeal- Pudding Teaspoon -- Pharyngeal -- Pharyngeal- Pudding Cup -- Pharyngeal -- Pharyngeal- Honey Teaspoon Delayed swallow initiation-vallecula;Pharyngeal residue - valleculae Pharyngeal -- Pharyngeal- Honey Cup -- Pharyngeal -- Pharyngeal- Nectar Teaspoon Delayed swallow initiation-pyriform sinuses;Penetration/Aspiration before swallow;Penetration/Aspiration during swallow;Penetration/Apiration after swallow;Pharyngeal residue - valleculae Pharyngeal Material enters airway, remains ABOVE vocal cords and not ejected out;Material enters airway, remains ABOVE vocal cords then ejected out Pharyngeal- Nectar Cup Delayed swallow initiation-pyriform sinuses;Penetration/Aspiration before swallow;Penetration/Aspiration during swallow;Penetration/Apiration after swallow;Pharyngeal residue - valleculae Pharyngeal Material enters airway, remains ABOVE vocal cords and not ejected out Pharyngeal- Nectar Straw --  Pharyngeal -- Pharyngeal- Thin Teaspoon -- Pharyngeal -- Pharyngeal- Thin Cup -- Pharyngeal -- Pharyngeal- Thin Straw -- Pharyngeal -- Pharyngeal- Puree Pharyngeal residue - valleculae;Delayed swallow initiation-vallecula Pharyngeal Material does not enter airway Pharyngeal- Mechanical Soft -- Pharyngeal -- Pharyngeal- Regular -- Pharyngeal -- Pharyngeal- Multi-consistency -- Pharyngeal -- Pharyngeal- Pill -- Pharyngeal -- Pharyngeal Comment --  No flowsheet data found. DeBlois, Riley Nearing 04/07/2019, 1:22 PM              Ct Renal Stone Study  Result Date: 04/06/2019 CLINICAL DATA:  Hydronephrosis. EXAM: CT ABDOMEN AND PELVIS WITHOUT CONTRAST TECHNIQUE: Multidetector CT imaging of the abdomen and pelvis was performed following the standard protocol without IV contrast. COMPARISON:  Ultrasound of April 05, 2019. FINDINGS: Lower chest: No acute abnormality. Hepatobiliary: No focal liver abnormality is seen. No gallstones, gallbladder wall thickening, or biliary dilatation. Pancreas: Unremarkable. No pancreatic ductal dilatation or surrounding inflammatory changes. Spleen: Normal in size without focal abnormality. Adrenals/Urinary Tract: Adrenal glands appear normal. Moderate bilateral hydroureteronephrosis is noted without evidence of renal or ureteral calculi. Catheter is noted in urinary bladder which appears to be decompressed. However, there appears to be significant diffuse wall thickening of urinary bladder which may be due to lack  of distension, but is concerning for cystitis. Clinical correlation is recommended. Stomach/Bowel: Stomach is within normal limits. Appendix appears normal. No evidence of bowel wall thickening, distention, or inflammatory changes. Vascular/Lymphatic: Aortic atherosclerosis. No enlarged abdominal or pelvic lymph nodes. Reproductive: Prostate is unremarkable. Other: No abdominal wall hernia or abnormality. No abdominopelvic ascites. Musculoskeletal: No acute or significant  osseous findings. IMPRESSION: Moderate bilateral hydroureteronephrosis is noted without evidence of obstructing calculus. Diffuse wall thickening of urinary bladder is noted which may be due to lack of distension, but is concerning for cystitis. Clinical correlation is recommended. Catheter is noted within urinary bladder. Aortic Atherosclerosis (ICD10-I70.0). Electronically Signed   By: Lupita Raider M.D.   On: 04/06/2019 09:24   Ct Head Code Stroke Wo Contrast  Result Date: 04/05/2019 CLINICAL DATA:  Code stroke. Right-sided weakness. Speech disturbance. Last seen normal 0730 hours. EXAM: CT HEAD WITHOUT CONTRAST TECHNIQUE: Contiguous axial images were obtained from the base of the skull through the vertex without intravenous contrast. COMPARISON:  09/03/2018 FINDINGS: Brain: No CT evidence of acute infarction. Chronic small-vessel ischemic changes of the cerebral hemispheric white matter. No mass lesion, hemorrhage, hydrocephalus or extra-axial collection. Vascular: No definite hyperdense vessel. One could question slight hyperdensity in the left M1 segment. Skull: Negative Sinuses/Orbits: Clear/normal Other: None ASPECTS ([place]berta Stroke Program Early CT Score) - Ganglionic level infarction (caudate, lentiform nuclei, internal capsule, insula, M1-M3 cortex): 6 - Supraganglionic infarction (M4-M6 cortex): 3 Total score (0-10 with 10 being normal): 9 IMPRESSION: 1. Possible early gray-white differentiation loss in the insula on the left. Chronic small-vessel ischemic changes. 2. One could question slight hyperdensity in the left M1 segment. This is not definite. 3. ASPECTS is 9 4. These results were communicated to Dr. Amada Jupiter at 10:01 amon 4/15/2020by text page via the [facility] messaging system. Electronically Signed   By: Paulina Fusi M.D.   On: 04/05/2019 10:04   Vas US Carotid  Result Date: 04/06/2019 Carotid Arterial Duplex Study Indications:  CVA. Risk Factors: Hypertension, hyperlipidemia, prior  CVA. Limitations:  Patient positioning, snoring, body habitus Performing Technologist: Blanch Media RVS  Examination Guidelines: A complete evaluation includes B-mode imaging, spectral Doppler, color Doppler, and power Doppler as needed of all accessible portions of each vessel. Bilateral testing is considered an integral part of a complete examination. Limited examinations for reoccurring indications may be performed as noted.  Right Carotid Findings: +----------+--------+--------+--------+-----------+--------+             PSV cm/s EDV cm/s Stenosis Describe    Comments  +----------+--------+--------+--------+-----------+--------+  CCA Prox   80       16                homogeneous           +----------+--------+--------+--------+-----------+--------+  CCA Distal 164      24                homogeneous           +----------+--------+--------+--------+-----------+--------+  ICA Prox   188      34       1-39%    homogeneous           +----------+--------+--------+--------+-----------+--------+  ICA Distal 112      22                                      +----------+--------+--------+--------+-----------+--------+  ECA  170      11                                      +----------+--------+--------+--------+-----------+--------+ +----------+--------+-------+--------+-------------------+             PSV cm/s EDV cms Describe Arm Pressure (mmHG)  +----------+--------+-------+--------+-------------------+  Subclavian 77                                             +----------+--------+-------+--------+-------------------+ +---------+--------+--------+--------------+  Vertebral PSV cm/s EDV cm/s Not identified  +---------+--------+--------+--------------+  Left Carotid Findings: +----------+--------+--------+--------+-----------+--------+             PSV cm/s EDV cm/s Stenosis Describe    Comments  +----------+--------+--------+--------+-----------+--------+  CCA Prox   181      26                homogeneous            +----------+--------+--------+--------+-----------+--------+  CCA Distal 118      23                homogeneous           +----------+--------+--------+--------+-----------+--------+  ICA Prox   161      28       1-39%    homogeneous           +----------+--------+--------+--------+-----------+--------+  ICA Distal 152      30                                      +----------+--------+--------+--------+-----------+--------+  ECA        170      12                                      +----------+--------+--------+--------+-----------+--------+ +----------+--------+--------+--------+-------------------+  Subclavian PSV cm/s EDV cm/s Describe Arm Pressure (mmHG)  +----------+--------+--------+--------+-------------------+             121                                             +----------+--------+--------+--------+-------------------+ +---------+--------+--+--------+--+---------+  Vertebral PSV cm/s 52 EDV cm/s 15 Antegrade  +---------+--------+--+--------+--+---------+    Preliminary     Micro Results    No results found for this or any previous visit (from the past 240 hour(s)).     Today   Subjective    Mercer Pod today has no new concerns, aphasia persist, episodes of intermittent tachycardia persist,          Patient has been seen and examined prior to discharge   Objective   Blood pressure (!) 98/57, pulse 80, temperature 98.1 F (36.7 C), temperature source Axillary, resp. rate 18, weight 88.8 kg, SpO2 100 %.   Intake/Output Summary (Last 24 hours) at 04/10/2019 1730 Last data filed at 04/10/2019 0940 Gross per 24 hour  Intake 3232.86 ml  Output 1200 ml  Net 2032.86 ml    Exam Gen:- Awake Alert,  , no acute distress HEENT:- [place].AT, No sclera icterus Neck-Supple  Neck,No JVD,.  Lungs-  CTAB , fair symmetrical air movement CV- S1, S2 normal, irregular,  abd-  +ve B.Sounds, Abd Soft, No tenderness,    Extremity/Skin:- No  edema, pedal pulses present  Psych-affect is  appropriate, oriented x3 Neuro-aphasia and right-sided hemiplegia GU--Foley catheter in situ   Data Review   CBC w Diff:  Lab Results  Component Value Date   WBC 10.9 (H) 04/10/2019   HGB 10.0 (L) 04/10/2019   HCT 33.5 (L) 04/10/2019   PLT 183 04/10/2019   LYMPHOPCT 13 04/05/2019   MONOPCT 7 04/05/2019   EOSPCT 1 04/05/2019   BASOPCT 1 04/05/2019    CMP:  Lab Results  Component Value Date   NA 142 04/10/2019   K 4.5 04/10/2019   CL 97 (L) 04/10/2019   CO2 30 04/10/2019   BUN 104 (H) 04/10/2019   CREATININE 5.78 (H) 04/10/2019   PROT 7.8 04/05/2019   ALBUMIN 3.3 (L) 04/06/2019   BILITOT 0.6 04/05/2019   ALKPHOS 133 (H) 04/05/2019   AST 43 (H) 04/05/2019   ALT 24 04/05/2019  .   Total Discharge time is about 33 minutes  Shon Hale M.D on 04/10/2019 at 5:30 PM  Go to [email or web address] -  for contact info  [facility] - Office  [phone]

## 2019-04-10 NOTE — Progress Notes (Addendum)
Patient stated he did not want to wear CPAP this evening, no distress noted, understands to contact respiratory if he changed his mind.   I was able to get the patient to wear CPAP at this time, placed with 2L O2 at this time, tolerating well.

## 2019-04-10 NOTE — Progress Notes (Addendum)
Progress Note  Patient Name: Gregory [name] Date of Encounter: 04/10/2019  Primary Cardiologist: No primary care provider on file. New- Dr Excell Seltzerooper  Subjective   Aphasic, denies chest pain, dyspnea or palpitations.  Inpatient Medications    Scheduled Meds: . aspirin  300 mg Rectal Daily   Or  . aspirin  325 mg Oral Daily  . atorvastatin  80 mg Oral q1800  . metoprolol tartrate  25 mg Oral TID  . NIFEdipine  30 mg Oral Daily   Continuous Infusions: . cefTRIAXone (ROCEPHIN)  IV Stopped (04/09/19 1225)  . dextrose Stopped (04/09/19 1629)   PRN Meds: acetaminophen **OR** acetaminophen (TYLENOL) oral liquid 160 mg/5 mL **OR** acetaminophen, acetaminophen, hydrALAZINE, ondansetron (ZOFRAN) IV, Resource ThickenUp Clear   Vital Signs    Vitals:   04/10/19 0200 04/10/19 0500 04/10/19 0821 04/10/19 0909  BP:    118/80  Pulse: 77  71 70  Resp: 18   14  Temp:  97.6 F (36.4 C) 97.8 F (36.6 C)   TempSrc:  Axillary Axillary   SpO2: 99%  97% 97%  Weight:        Intake/Output Summary (Last 24 hours) at 04/10/2019 0920 Last data filed at 04/10/2019 [id number] Gross per 24 hour  Intake 2872.86 ml  Output 1200 ml  Net 1672.86 ml   Last 3 Weights 04/05/2019 09/09/2018 09/04/2018  Weight (lbs) 195 lb 12.3 oz 220 lb 235 lb 7.2 oz  Weight (kg) 88.8 kg 99.791 kg 106.8 kg      Telemetry    NSR with frequent PACs and runs of MAT - Personally Reviewed  ECG    none  Physical Exam   GEN: No acute distress.   Neck: No JVD Cardiac: RRR, no murmurs, rubs, or gallops.  Respiratory: Clear to auscultation bilaterally. GI: Soft, nontender, non-distended  MS: No edema; No deformity. Neuro:  aphasic Psych: Normal affect   Labs    Chemistry Recent Labs  Lab 04/05/19 0946  04/06/19 0015  04/08/19 0357 04/09/19 0900 04/10/19 0518  NA 138   < > 140   < > 147* 142 142  K 6.8*   < > 6.1*   < > 4.8 4.3 4.5  CL 112*   < > 110   < > 96* 96* 97*  CO2 10*   < > 16*   < > 33* 32 30   GLUCOSE 115*   < > 112*   < > 103* 154* 120*  BUN 158*   < > 154*   < > 127* 117* 104*  CREATININE 10.48*   < > 9.56*   < > 7.04* 6.01* 5.78*  CALCIUM 9.1   < > 8.7*   < > 8.1* 8.1* 8.2*  PROT 7.8  --   --   --   --   --   --   ALBUMIN 3.9  --  3.3*  --   --   --   --   AST 43*  --   --   --   --   --   --   ALT 24  --   --   --   --   --   --   ALKPHOS 133*  --   --   --   --   --   --   BILITOT 0.6  --   --   --   --   --   --   GFRNONAA 4*   < >  5*   < > 7* 9* 9*  GFRAA 5*   < > 6*   < > 8* 10* 11*  ANIONGAP 16*   < > 14   < > 18* 14 15   < > = values in this interval not displayed.     Hematology Recent Labs  Lab 04/07/19 0906 04/08/19 0357 04/10/19 0518  WBC 14.0* 10.7* 10.9*  RBC 3.43* 3.38* 3.58*  HGB 9.4* 9.5* 10.0*  HCT 30.4* 30.6* 33.5*  MCV 88.6 90.5 93.6  MCH 27.4 28.1 27.9  MCHC 30.9 31.0 29.9*  RDW 13.1 13.3 13.4  PLT 164 150 183    Cardiac Enzymes Recent Labs  Lab 04/05/19 1830 04/06/19 0015 04/06/19 0855 04/06/19 2019  TROPONINI 10.69* 13.96* 17.34* 9.60*    Recent Labs  Lab 04/05/19 1118  TROPIPOC 3.99*     BNPNo results for input(s): BNP, PROBNP in the last 168 hours.   DDimer No results for input(s): DDIMER in the last 168 hours.   Radiology    No results found.  Cardiac Studies   TTE: 04/05/19  IMPRESSIONS   1. The left ventricle has normal systolic function, with an ejection fraction of 55-60%. The cavity size was normal. There is moderately increased left ventricular wall thickness. Left ventricular diastolic Doppler parameters are consistent with  impaired relaxation. No evidence of left ventricular regional wall motion abnormalities.  2. The right ventricle has normal systolic function. The cavity was normal. There is no increase in right ventricular wall thickness.  3. Mild thickening of the mitral valve leaflet. Mild calcification of the mitral valve leaflet. There is moderate mitral annular calcification present.  4. The  aortic valve has an indeterminate number of cusps. Moderate thickening of the aortic valve. Moderate calcification of the aortic valve. Mild-moderate stenosis of the aortic valve.  5. The aortic root is normal in size and structure.  6. No intracardiac thrombi or masses were visualized.  Patient Profile     68 y.o. male with history of CKD stage III, hypertension, and hyperlipidemia now presenting with acute stroke, multifocal atrial tachycardia, acute on chronic renal failure in the setting of obstructive uropathy, and NSTEMI.  Assessment & Plan    1. NSTEMI: peak troponin of 17.34. Plan is currently for medical management given his acute stroke/renal failure and has remained hemodynamically stable. EF noted at 55-60% on echo with no rWMA. Remains on ASA (full dose), statin, and BB.   2. 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 Loop placed on 4/17 by Dr. Ladona Ridgel. Now on full dose ASA and statin therapy  3. Multifocal atrial tachycardia: on BB therapy. No specific treatment other that improving underlying medical conditions  4. AKI with CKD stage III: in the setting of bladder outlet obstruction. In dwelling foley. Renal following with plans for supportive care. Cr peaked at 10.9>> 5.78  [facility] will sign off.   Medication Recommendations:  Continue current oral meds Other recommendations (labs, testing, etc):  none Follow up as an outpatient:  Will need follow up with [facility] in 4 weeks.   For questions or updates, please contact [facility] Please consult [email or web address] for contact info under        Signed, Peter [place] MD, [facility]  04/10/2019, 9:20 AM

## 2019-04-10 NOTE — Progress Notes (Signed)
Physical Therapy Treatment Patient Details Name: Gregory [name] MRN: [id number] DOB: 1951-10-08 Today's Date: 04/10/2019    History of Present Illness Pt is a 68 y/o male admitted secondary to slurred speech and R sided weakness. 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  CT angiogram does show left carotid stenosis.  Review of patient's chart shows that he had cerebral catheter angiogram in September 2019 which showed only 50% extracranial carotid stenosis on either side.  Patient has been found to have hydronephrosis, renal failure as well as NSTEMI. Per cardiology, pt is not a candidate for any invasive evaluation.  He will require medical treatment/supportive care. PMH including but not limited to CKD, HTN and HLD.    PT Comments    Patient seen for mobility progression. This session focused on bed mobility and functional transfer training. Pt requires +2 assist utilizing Stedy standing frame for sit to stand transfers. Pt continues to demonstrate R side weakness but does assist with R side as able and appears to be following vc more consistently this session. Pt is non verbal during session and answering questions with head nods. Continue to recommend CIR for further skilled PT services to maximize independence and safety with mobility.     Follow Up Recommendations  CIR     Equipment Recommendations  None recommended by PT    Recommendations for Other Services       Precautions / Restrictions Precautions Precautions: Fall Restrictions Weight Bearing Restrictions: No    Mobility  Bed Mobility Overal bed mobility: Needs Assistance Bed Mobility: Rolling;Sidelying to Sit Rolling: Min guard;Min assist Sidelying to sit: Mod assist       General bed mobility comments: pt initiated rolling to R side using R bed rail when given cue to "sit up on the side of the bed"; assist required to bring bilat LE off EOB and to elevate trunk into sitting; cues for  sequencing  Transfers Overall transfer level: Needs assistance   Transfers: Sit to/from Stand Sit to Stand: Max assist;+2 physical assistance;Mod assist;+2 safety/equipment         General transfer comment: pt initiating sit to stand using L side to power up and increased assistance required on R side for R UE positioning and powering up; pt with R lateral lean upon standing and requires increased assist to maintain standing; mod A +2 to get into standing and max A (+1-2) to maintain utilizing Stedy standing frame  Ambulation/Gait             General Gait Details: unable   Stairs             Wheelchair Mobility    Modified Rankin (Stroke Patients Only) Modified Rankin (Stroke Patients Only) Pre-Morbid Rankin Score: No symptoms Modified Rankin: Severe disability     Balance Overall balance assessment: Needs assistance Sitting-balance support: Feet supported;Single extremity supported Sitting balance-Leahy Scale: Fair Sitting balance - Comments: assist required initially and then able to maintain sitting balance EOB with min guard Postural control: Right lateral lean Standing balance support: Bilateral upper extremity supported Standing balance-Leahy Scale: Zero Standing balance comment: pt unable to achieve upright posture and with R lateral lean in standing                             Cognition Arousal/Alertness: Awake/alert Behavior During Therapy: Flat affect Overall Cognitive Status: Difficult to assess Area of Impairment: Following commands;Problem solving  Following Commands: Follows one step commands with increased time;Follows one step commands inconsistently     Problem Solving: Slow processing;Difficulty sequencing;Requires verbal cues;Requires tactile cues General Comments: pt is able to follow vc this session and responding to questions with head nods      Exercises      General Comments General  comments (skin integrity, edema, etc.): SpO2 maintained 95% or > on RA througout session      Pertinent Vitals/Pain Pain Assessment: Faces Faces Pain Scale: No hurt Pain Descriptors / Indicators: Grimacing Pain Intervention(s): Monitored during session    Home Living                      Prior Function            PT Goals (current goals can now be found in the care plan section) Progress towards PT goals: Progressing toward goals    Frequency    Min 4X/week      PT Plan Current plan remains appropriate    Co-evaluation PT/OT/SLP Co-Evaluation/Treatment: Yes Reason for Co-Treatment: Complexity of the patient's impairments (multi-system involvement);For patient/therapist safety;To address functional/ADL transfers PT goals addressed during session: Mobility/safety with mobility;Balance;Strengthening/ROM        AM-PAC PT "6 Clicks" Mobility   Outcome Measure  Help needed turning from your back to your side while in a flat bed without using bedrails?: A Lot Help needed moving from lying on your back to sitting on the side of a flat bed without using bedrails?: A Lot Help needed moving to and from a bed to a chair (including a wheelchair)?: A Lot Help needed standing up from a chair using your arms (e.g., wheelchair or bedside chair)?: A Lot Help needed to walk in hospital room?: Total Help needed climbing 3-5 steps with a railing? : Total 6 Click Score: 10    End of Session Equipment Utilized During Treatment: Gait belt Activity Tolerance: Patient tolerated treatment well Patient left: with call bell/phone within reach;in chair;with chair alarm set Nurse Communication: Mobility status;Need for lift equipment PT Visit Diagnosis: Other abnormalities of gait and mobility (R26.89);Hemiplegia and hemiparesis Hemiplegia - Right/Left: Right Hemiplegia - dominant/non-dominant: Non-dominant Hemiplegia - caused by: Cerebral infarction     Time: 1210-1233 PT Time  Calculation (min) (ACUTE ONLY): 23 min  Charges:  $Gait Training: 8-22 mins                     Erline LevineKellyn Zonnie Landen, PTA Acute Rehabilitation Services Pager: [phone]) [phone] Office: [phone]     Carolynne EdouardKellyn R Jashan Cotten 04/10/2019, 1:47 PM

## 2019-04-10 NOTE — Progress Notes (Signed)
  Speech Language Pathology Treatment: Cognitive-Linquistic(Aphasia )  Patient Details Name: Gregory [name] MRN: [id number] DOB: 04/12/1951 Today's Date: 04/10/2019 Time: 4650-3546 SLP Time Calculation (min) (ACUTE ONLY): 22 min  Assessment / Plan / Recommendation Clinical Impression  Pt was alert and cooperative throughout the session and denied any pain. Vocalizations continue to be present throughout the session without intelligible speech. 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 He was able to imitate bilabial plosive phonemes with 50% accuracy with more consistently accurate production of voiceless than voiced phonemes. Lingua-dental plosive phonemes were imitated with 85% accuracy when additional models were given. Imitation of CV and minimal constrast words was attempted but pt demonstrated 0% accuracy despite max cues. He was able to respond to more complex yes/no questions related to his care with 100% accuracy but repetition was inconsistently needed. SLP will continue to follow pt.     HPI HPI: Pt is a 68 y.o. male with medical history significant of chronic kidney disease stage III, hypertension, hyperlipidemia, who presented to the hospital with chief complaint of sudden onset right-sided weakness and inability to talk.  MRI of the brain revealed 3.5 cm region of acute infarction affecting the left insula and posterior frontal region.      SLP Plan  Continue with current plan of care       Recommendations  Diet recommendations: Dysphagia 1 (puree);Honey-thick liquid Liquids provided via: Teaspoon Medication Administration: Crushed with puree Supervision: Full supervision/cueing for compensatory strategies Compensations: Slow rate;Small sips/bites;Monitor for anterior loss;Lingual sweep for clearance of pocketing;Multiple dry swallows after each bite/sip;Clear  throat intermittently Postural Changes and/or Swallow Maneuvers: Seated upright 90 degrees                Oral Care Recommendations: Oral care BID Follow up Recommendations: Inpatient Rehab SLP Visit Diagnosis: Aphasia (R47.01);Apraxia (R48.2) Plan: Continue with current plan of care       Sura Canul I. Vear Clock, MS, CCC-SLP Acute Rehabilitation Services Office number [phone] Pager [phone]                Scheryl Marten 04/10/2019, 4:51 PM

## 2019-04-10 NOTE — Progress Notes (Signed)
Gregory [name], OT  Rehab Admission Coordinator  Physical Medicine and Rehabilitation  PMR Pre-admission  Signed  Date of Service:  04/07/2019 4:49 PM       Related encounter: ED to Hosp-Admission (Current) from 04/05/2019 in [facility]      Signed         PMR Admission Coordinator Pre-Admission Assessment  Patient: Gregory [name] is an 68 y.o., male MRN: [id number] DOB: May 08, 1951 Height:   Weight: 88.8 kg                                                                                                                                                  Insurance Information HMO: Yes    PPO:      PCP:      IPA:      80/20:      OTHER:  PRIMARY: [employer]      Policy#: [id number]      Subscriber: Patient CM Name: Gregory [name]       Phone#: [phone] [id number]     Fax#: [phone] Pre-Cert#: [id number]      Employer:  [employer] provided by Jeanette Caprice on 04/10/19 for admit 04/10/19 to CIR. Next review date is 04/17/19 with updates sent to [facility] (f): [phone]; [phone] Benefits:  Phone #: NA     Name: [email or web address] Eff. Date: 12/21/18 still active     Deduct: NA      Out of Pocket Max: $3,400 ($0 met)      Life Max: NA CIR: $295/day for days 1-6, $0/day for days 7+      SNF: $0/day for days 1-20, $178/day for days 21-100, limited to 100 days/cal yr Outpatient: limited by medical necessity     Co-Pay: $10-$40 pending service/visit Home Health: 100%, limited by medical necessity      Co-Pay: $0 DME: 80%     Co-Pay: 20% Providers:  SECONDARY:       Policy#:       Subscriber:  CM Name:       Phone#:      Fax#:  Pre-Cert#:       Employer:  Benefits:  Phone #:      Name:  Eff. Date:      Deduct:       Out of Pocket Max:       Life Max:  CIR:       SNF:  Outpatient:      Co-Pay:  Home Health:       Co-Pay:  DME:      Co-Pay:  Medicaid Application Date:       Case Manager:  Disability Application Date:       Case Worker:   The Data  Collection Information Summary for patients in Inpatient Rehabilitation Facilities with attached Privacy Act [place] Records was provided  and verbally reviewed with: Family  Emergency Contact Information         Contact Information    Name Relation Home Work Gregory [name] Other [phone]     Gregory [name]   [phone]     Current Medical History  Patient Admitting Diagnosis: 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 Pt presented 04/05/2019 with acute onset of right sided weakness and slurred speech. Cranial CT scan showed possible early gray- white differentiation loss in the insula on the left. Chronic small vessel ischemic changes. MRI/MRA showed a 3.5 centimeter region of acute infarction affecting the left insula and posterior frontal region. No evidence of gross hemorrhage. MRA negative. Patient did not receive TPA. Noted findings of hyperkalemia 6.8 as well as elevated creatinine 10.4 rate from baseline 1.70, troponin 3.29. EKG with ST changes. Carotid Dopplers 40-59% ICA stenosis bilaterally.Echocardiogram with ejection fraction of 60% and normal systolic function. Cardiology service was consulted for suspect non-STEMI with medical management recommended. Patient did undergo placement of loop recorder 04/07/2019 per Dr. Lovena Le. Renal ultrasound showed bilateral hydronephrosis with nephrology services consulted as well as urology. A Foley catheter tube was placed and would remain in place until follow-up outpatient urology.Pt's renal function continues to improve 5.78 creatinine 04/10/2019. Maintained on aspirin for CVA prophylaxis. Pt is on a dysphagia #1 honey thick liquid diet. Therapy evaluations completed with  recommendations for CIR. The patient is to be admitted for a comprehensive rehabilitation program on 04/10/19.  Complete NIHSS TOTAL: 10 Glasgow Coma Scale Score: 15  Past Medical History      Past Medical History:  Diagnosis Date   AKI (acute kidney injury) ([place])    CKD (chronic kidney disease) stage 2, GFR 60-89 ml/min    CVA (cerebral vascular accident) ([place])    Hyperlipidemia    Hypertension    PVD (peripheral vascular disease) ([place])     Family History  family history is not on file.  Prior Rehab/Hospitalizations:  Has the patient had prior rehab or hospitalizations prior to admission? Yes  Has the patient had major surgery during 100 days prior to admission? Yes  Current Medications   Current Facility-Administered Medications:    acetaminophen (TYLENOL) tablet 650 mg, 650 mg, Oral, Q4H PRN **OR** acetaminophen (TYLENOL) solution 650 mg, 650 mg, Per Tube, Q4H PRN **OR** acetaminophen (TYLENOL) suppository 650 mg, 650 mg, Rectal, Q4H PRN, Evans Lance, MD   acetaminophen (TYLENOL) tablet 325-650 mg, 325-650 mg, Oral, Q4H PRN, Evans Lance, MD   [START ON 04/11/2019] amLODipine (NORVASC) tablet 2.5 mg, 2.5 mg, Oral, Daily, Emokpae, Courage, MD   aspirin suppository 300 mg, 300 mg, Rectal, Daily, 300 mg at 04/07/19 1010 **OR** aspirin tablet 325 mg, 325 mg, Oral, Daily, Evans Lance, MD, 325 mg at 04/10/19 1105   atorvastatin (LIPITOR) tablet 80 mg, 80 mg, Oral, q1800, Satira Sark, MD, 80 mg at 04/09/19 1710   cefTRIAXone (ROCEPHIN) 1 g in sodium chloride 0.9 % 100 mL IVPB, 1 g, Intravenous, Q24H, Evans Lance, MD, Last Rate: 200 mL/hr at 04/10/19 1104, 1 g at 04/10/19 1104   dextrose 5 % solution, , Intravenous, Continuous, Emokpae, Courage, MD, Last Rate: 60 mL/hr at 04/10/19 1457   hydrALAZINE (APRESOLINE) injection 10 mg, 10 mg, Intravenous, Q6H PRN, Evans Lance, MD  metoprolol tartrate (LOPRESSOR) tablet 25 mg, 25 mg, Oral,  QID, Emokpae, Courage, MD, 25 mg at 04/10/19 1613   ondansetron (ZOFRAN) injection 4 mg, 4 mg, Intravenous, Q6H PRN, Evans Lance, MD   Resource ThickenUp Clear, , Oral, PRN, Evans Lance, MD  Patients Current Diet:     Diet Order                  DIET - DYS 1 Room service appropriate? Yes; Fluid consistency: Honey Thick  Diet effective now               Precautions / Restrictions Precautions Precautions: Fall Restrictions Weight Bearing Restrictions: No   Has the patient had 2 or more falls or a fall with injury in the past year?Yes  Prior Activity Level Community (5-7x/wk): retired but drove; was Technical brewer prior to retirement. Independet PTA, used RW.   Prior Functional Level Prior Function Level of Independence: Needs assistance Comments: Per chart review/old therapy notes, pt was able to ambulate short distances, and used a scooter for longer distances and required assist for ADLs - chart indicates pt is divorced   Self Care: Did the patient need help bathing, dressing, using the toilet or eating?  Independent  Indoor Mobility: Did the patient need assistance with walking from room to room (with or without device)? Independent  Stairs: Did the patient need assistance with internal or external stairs (with or without device)? Independent  Functional Cognition: Did the patient need help planning regular tasks such as shopping or remembering to take medications? Independent  Home Assistive Devices / Equipment  Prior Device Use: Indicate devices/aids used by the patient prior to current illness, exacerbation or injury? Walker  Current Functional Level Cognition  Overall Cognitive Status: Difficult to assess Difficult to assess due to: Impaired communication Orientation Level: Oriented to person, Oriented to situation, Disoriented to place, Disoriented to time Following Commands: Follows one step commands with increased time,  Follows one step commands inconsistently General Comments: pt is able to follow vc this session and responding to questions with head nods    Extremity Assessment (includes Sensation/Coordination)  Upper Extremity Assessment: RUE deficits/detail RUE Deficits / Details: demonstrates attempt to push off bed surface with R UE and able to abduct from body  RUE Coordination: decreased fine motor, decreased gross motor  Lower Extremity Assessment: Defer to PT evaluation RLE Deficits / Details: no active movement of R LE throughout session    ADLs  Overall ADL's : Needs assistance/impaired Eating/Feeding: Moderate assistance Eating/Feeding Details (indicate cue type and reason): sat up in chair for tech to feed lunch at this time. poor oral closure demonstrated this session with oral secretions Grooming: Wash/dry hands, Wash/dry face, Oral care, Brushing hair, Moderate assistance, Sitting Grooming Details (indicate cue type and reason): Pt attempted to brush beard/face with toothbrush when asked to demonstrate use of toothbrush  Upper Body Bathing: Maximal assistance, Sitting Lower Body Bathing: Total assistance Lower Body Bathing Details (indicate cue type and reason): incontinence and lacks awarness Upper Body Dressing : Maximal assistance, Sitting Lower Body Dressing: Total assistance, Sit to/from stand Toilet Transfer: +2 for physical assistance, Moderate assistance Toilet Transfer Details (indicate cue type and reason): requires Incr (A) on R side and pushign with L UE Toileting- Clothing Manipulation and Hygiene: Total assistance, Sit to/from stand Functional mobility during ADLs: Maximal assistance, +2 for physical assistance, +2 for safety/equipment General ADL Comments: pt with stedy used to transfer from bed to chair this  ession with (A) to sustain static position in stedy    Mobility  Overal bed mobility: Needs Assistance Bed Mobility: Rolling, Sidelying to Sit Rolling: Min  guard, Min assist Sidelying to sit: Mod assist Supine to sit: Mod assist, HOB elevated, +2 for physical assistance Sit to supine: Mod assist, +2 for physical assistance General bed mobility comments: pt initiated rolling to R side using R bed rail when given cue to "sit up on the side of the bed"; assist required to bring bilat LE off EOB and to elevate trunk into sitting; cues for sequencing    Transfers  Overall transfer level: Needs assistance Equipment used: 2 person hand held assist Transfer via Lift Equipment: Stedy Transfers: Sit to/from [employer] to Stand: Max assist, +2 physical assistance, Mod assist, +2 safety/equipment General transfer comment: pt initiating sit to stand using L side to power up and increased assistance required on R side for R UE positioning and powering up; pt with R lateral lean upon standing and requires increased assist to maintain standing; mod A +2 to get into standing and max A (+1-2) to maintain utilizing Stedy standing frame    Ambulation / Gait / Stairs / Wheelchair Mobility  Ambulation/Gait General Gait Details: unable    Posture / Balance Dynamic Sitting Balance Sitting balance - Comments: assist required initially and then able to maintain sitting balance EOB with min guard Balance Overall balance assessment: Needs assistance Sitting-balance support: Feet supported, Single extremity supported Sitting balance-Leahy Scale: Fair Sitting balance - Comments: assist required initially and then able to maintain sitting balance EOB with min guard Postural control: Right lateral lean Standing balance support: Bilateral upper extremity supported Standing balance-Leahy Scale: Zero Standing balance comment: pt unable to achieve upright posture and with R lateral lean in standing     Special needs/care consideration BiPAP/CPAP: Yes CPAP while in acute;  at one point used CPAP at home at night, but according to son, no longer has it. CPM:  no Continuous Drip IV: ceftriaxone, dextrose 5% Dialysis: : no        Days:  no Life Vest: no Oxygen: no Special Bed: no Trach Size: no Wound Vac (area): no      Location: no Skin: left knee abrasion                    Bowel mgmt: last BM: 04/09/19, incontinent Bladder mgmt: external catheter in place Diabetic mgmt: no Behavioral consideration : no Chemo/radiation : no     Previous Home Environment (from acute therapy documentation) Additional Comments: pt with great difficulty speaking but able to nod "yes" and shake head "no" in response to some questions - pt lives with his spouse and was previously independent  Discharge Living Setting Plans for Discharge Living Setting: Patient's home, Lives with (comment)(lives with ex-wife) Type of Home at Discharge: House Discharge Home Layout: One level Discharge Home Access: Stairs to enter(per son, [place] could help with ramp) Entrance Stairs-Rails: Can reach both Entrance Stairs-Number of Steps: (3-4 steps) Discharge Bathroom Shower/Tub: Tub/shower unit Discharge Bathroom Toilet: Standard Discharge Bathroom Accessibility: Yes How Accessible: Accessible via walker Does the patient have any problems obtaining your medications?: No  Social/Family/Support Systems Patient Roles: Other (Comment)(lives with ex-wife, has son and daughter nearby) Contact Information: ex-wife: Deneise Lever [phone]); son: Lorraine [phone]) Anticipated Caregiver: ex-wife, and son and daugther to help out as needed Anticipated Caregiver's Contact Information: see above Ability/Limitations of Caregiver: Min A Caregiver Availability: 24/7 Discharge Plan Discussed with Primary  Caregiver: Yes(son and ex-wife) Is Caregiver In Agreement with Plan?: Yes Does Caregiver/Family have Issues with Lodging/Transportation while Pt is in Rehab?: No   Goals/Additional Needs Patient/Family Goal for Rehab: PT/OT: Mod I/Supervision; SLP: Sup/Min A Expected length of  stay: 13-20 days Cultural Considerations: NA Dietary Needs: DYS 1, honey thick liquids Equipment Needs: TBD Pt/Family Agrees to Admission and willing to participate: Yes Program Orientation Provided & Reviewed with Pt/Caregiver Including Roles  & Responsibilities: Yes(son, ex-wife, and pt)  Barriers to Discharge: Home environment access/layout  Barriers to Discharge Comments: steps to enter; tub/shower    Decrease burden of Care through IP rehab admission: NA   Possible need for SNF placement upon discharge: Not anticipated; pt has great social support at DC through multiple family members who are willing and able to assist as needed. Pt ha a good prognosis for further progress through CIR.    Patient Condition: This patient's medical and functional status has changed since the consult dated: 04/07/19 in which the Rehabilitation Physician determined and documented that the patient's condition is appropriate for intensive rehabilitative care in an inpatient rehabilitation facility. See "History of Present Illness" (above) for medical update. Functional changes are: progression in ability to begin transfer training due to improved medical status that was prohibiting initially; Pt's elevated HR was limiting factor but has stablized to allow for transfers of Mod/Max A x2 using Stedy. Patient's medical and functional status update has been discussed with the Rehabilitation physician and patient remains appropriate for inpatient rehabilitation. Will admit to inpatient rehab today.  Preadmission Screen Completed By:  Gregory [name], OT, 04/10/2019 5:07 PM ______________________________________________________________________   Discussed status with Dr. Naaman Plummer on 04/10/19 at  5:02PM and received approval for admission today.  Admission Coordinator:  Gregory [name], time 5:06PM Sudie Grumbling 04/10/19         Cosigned by: Meredith Staggers, MD at 04/10/2019 5:36 PM  Revision History

## 2019-04-10 NOTE — Progress Notes (Signed)
Ranelle Oyster, MD  Physician  Physical Medicine and Rehabilitation  Consult Note  Signed  Date of Service:  04/07/2019 6:43 AM       Related encounter: ED to Hosp-Admission (Current) from 04/05/2019 in [place] [facility]      Signed      Expand All Collapse All         Physical Medicine and Rehabilitation Consult Reason for Consult: Right side weakness and slurred speech Referring Physician: [facility]   HPI: Gregory [name] is a 68 y.o.right handed male  With history of chronic kidney disease stage III with creatinine baseline 1.6-1.7, hypertension, hyperlipidemia. Per report patient lives with spouse reportedly independent prior to admission. Presented 04/05/2019 with acute onset of right-sided weakness and slurred speech. Cranial CT scan showed possible early gray-white differentiation loss in the insula on the left. 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 No evidence of gross hemorrhage. MRA negative. Patient did not receive TPA. Noted findings of hyperkalemia 6.8 as well as elevated creatinine 10.48 from baseline 1.70, troponin 3.29. EKG with ST changes. Echocardiogram with ejection fraction of 60% normal systolic function. 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 Cardiology services consulted suspect non-STEMI medical management recommended. Renal ultrasound showed bilateral hydronephrosis with nephrology services consulted as well as urology. A Foley catheter tube was placed and and await plan for possible nephrostomy tubes. Currently on aspirin for CVA prophylaxis. Therapy evaluation completed with recommendations of physical medicine rehabilitation consult.   Review of Systems  Unable to perform ROS: Acuity of condition       Past Medical History:  Diagnosis Date   AKI (acute kidney injury) (HCC)     CKD (chronic kidney disease) stage 2, GFR 60-89 ml/min    CVA (cerebral vascular accident) (HCC)    Hyperlipidemia    Hypertension    PVD (peripheral vascular disease) (HCC)         Past Surgical History:  Procedure Laterality Date   AORTIC ARCH ANGIOGRAPHY N/A 09/09/2018   Procedure: AORTIC ARCH ANGIOGRAPHY;  Surgeon: Sherren Kerns, MD;  Location: [facility];  Service: Cardiovascular;  Laterality: N/A;   CAROTID ANGIOGRAPHY Bilateral 09/09/2018   Procedure: CAROTID ANGIOGRAPHY;  Surgeon: Sherren Kerns, MD;  Location: [facility];  Service: Cardiovascular;  Laterality: Bilateral;   No family history on file. Social History:  has no history on file for tobacco, alcohol, and drug. Allergies: No Known Allergies       Medications Prior to Admission  Medication Sig Dispense Refill   allopurinol (ZYLOPRIM) 300 MG tablet Take 300 mg by mouth daily.     aspirin EC 81 MG EC tablet Take 1 tablet (81 mg total) by mouth daily. 30 tablet 1   atorvastatin (LIPITOR) 80 MG tablet Take 1 tablet (80 mg total) by mouth daily at 6 PM. 90 tablet 1   hydrochlorothiazide (HYDRODIURIL) 50 MG tablet Take 50 mg by mouth daily.     lisinopril (PRINIVIL,ZESTRIL) 40 MG tablet Take 40 mg by mouth daily.     clopidogrel (PLAVIX) 75 MG tablet Take 1 tablet (75 mg total) by mouth daily. 90 tablet 1   diclofenac (VOLTAREN) 75 MG EC tablet Take 75 mg by mouth at bedtime as needed for mild pain or moderate pain.      Home: Home Living Family/patient expects to be discharged to:: Unsure  Additional Comments: pt with great difficulty speaking but able to nod "yes" and shake head "no" in response to some questions - pt lives with his spouse and was previously independent  Functional History: Prior Function Comments: pt with great difficulty speaking but able to nod "yes" and shake head "no" in response to some questions - pt lives with his spouse and was previously  independent Functional Status:  Mobility: Bed Mobility Overal bed mobility: Needs Assistance Bed Mobility: Supine to Sit, Sit to Supine Supine to sit: Mod assist, HOB elevated Sit to supine: Mod assist General bed mobility comments: increased time and effort, assistance needed for R LE movement off of and onto bed as well as for trunk elevation with use of L UE on bed rail Transfers General transfer comment: deferred due to pt fatigue and fluctuating elevated HR in sitting at EOB (110-130 bpm)  ADL:  Cognition: Cognition Overall Cognitive Status: Difficult to assess Orientation Level: Oriented to person Cognition Arousal/Alertness: Awake/alert Behavior During Therapy: Flat affect Overall Cognitive Status: Difficult to assess Difficult to assess due to: Impaired communication(aphasic)  Blood pressure (!) 170/62, pulse (!) 114, temperature 98.5 F (36.9 C), temperature source Oral, resp. rate 18, weight 88.8 kg, SpO2 98 %. Physical Exam  Constitutional: No distress.  HENT:  Head: Normocephalic.  Eyes: Pupils are equal, round, and reactive to light.  Neck: Normal range of motion.  Cardiovascular:  tachy  Respiratory: Effort normal.  Genitourinary:    Genitourinary Comments: foley   Neurological:  Patient is alert. Appears easily distracted. Remained nonverbal for exam. He did follow some simple verbal commands. Right hemiparesis with inconsistent effort. Withdraws from pain on right side.  Skin: Skin is warm.  Psychiatric:  flat    LabResultsLast24Hours        Results for orders placed or performed during the hospital encounter of 04/05/19 (from the past 24 hour(s))  Basic metabolic panel     Status: Abnormal   Collection Time: 04/06/19  8:18 AM  Result Value Ref Range   Sodium 141 135 - 145 mmol/L   Potassium 5.7 (H) 3.5 - 5.1 mmol/L   Chloride 106 98 - 111 mmol/L   CO2 18 (L) 22 - 32 mmol/L   Glucose, Bld 94 70 - 99 mg/dL   BUN 161147 (H) 8 - 23  mg/dL   Creatinine, Ser 0.968.83 (H) 0.61 - 1.24 mg/dL   Calcium 8.2 (L) 8.9 - 10.3 mg/dL   GFR calc non Af Amer 5 (L) >60 mL/min   GFR calc Af Amer 6 (L) >60 mL/min   Anion gap 17 (H) 5 - 15  Troponin I - Once     Status: Abnormal   Collection Time: 04/06/19  8:55 AM  Result Value Ref Range   Troponin I 17.34 (HH) <0.03 ng/mL  Basic metabolic panel     Status: Abnormal   Collection Time: 04/06/19  1:37 PM  Result Value Ref Range   Sodium 143 135 - 145 mmol/L   Potassium 5.4 (H) 3.5 - 5.1 mmol/L   Chloride 105 98 - 111 mmol/L   CO2 20 (L) 22 - 32 mmol/L   Glucose, Bld 97 70 - 99 mg/dL   BUN 045144 (H) 8 - 23 mg/dL   Creatinine, Ser 4.098.54 (H) 0.61 - 1.24 mg/dL   Calcium 8.3 (L) 8.9 - 10.3 mg/dL   GFR calc non Af Amer 6 (L) >60 mL/min   GFR calc Af Amer 7 (L) >60 mL/min   Anion gap  18 (H) 5 - 15  CBC     Status: Abnormal   Collection Time: 04/06/19  1:37 PM  Result Value Ref Range   WBC 8.3 4.0 - 10.5 K/uL   RBC 2.75 (L) 4.22 - 5.81 MIL/uL   Hemoglobin 7.8 (L) 13.0 - 17.0 g/dL   HCT 16.1 (L) 09.6 - 04.5 %   MCV 88.7 80.0 - 100.0 fL   MCH 28.4 26.0 - 34.0 pg   MCHC 32.0 30.0 - 36.0 g/dL   RDW 40.9 81.1 - 91.4 %   Platelets 179 150 - 400 K/uL   nRBC 0.0 0.0 - 0.2 %  Type and screen [facility]     Status: None   Collection Time: 04/06/19  1:44 PM  Result Value Ref Range   ABO/RH(D) A POS    Antibody Screen NEG    Sample Expiration      04/09/2019 Performed at [facility], [address], [place], [place] [address]   ABO/Rh     Status: None   Collection Time: 04/06/19  1:44 PM  Result Value Ref Range   ABO/RH(D)      A POS Performed at [facility], [address], [place], [place] [address]   Basic metabolic panel     Status: Abnormal   Collection Time: 04/06/19  8:19 PM  Result Value Ref Range   Sodium 143 135 - 145 mmol/L   Potassium 5.5 (H) 3.5 - 5.1 mmol/L   Chloride 103 98 - 111 mmol/L    CO2 24 22 - 32 mmol/L   Glucose, Bld 109 (H) 70 - 99 mg/dL   BUN 865 (H) 8 - 23 mg/dL   Creatinine, Ser 7.84 (H) 0.61 - 1.24 mg/dL   Calcium 8.2 (L) 8.9 - 10.3 mg/dL   GFR calc non Af Amer 6 (L) >60 mL/min   GFR calc Af Amer 7 (L) >60 mL/min   Anion gap 16 (H) 5 - 15  Troponin I - Once-Timed     Status: Abnormal   Collection Time: 04/06/19  8:19 PM  Result Value Ref Range   Troponin I 9.60 (HH) <0.03 ng/mL  CBC     Status: Abnormal   Collection Time: 04/06/19  8:19 PM  Result Value Ref Range   WBC 10.1 4.0 - 10.5 K/uL   RBC 3.58 (L) 4.22 - 5.81 MIL/uL   Hemoglobin 9.9 (L) 13.0 - 17.0 g/dL   HCT 69.6 (L) 29.5 - 28.4 %   MCV 88.3 80.0 - 100.0 fL   MCH 27.7 26.0 - 34.0 pg   MCHC 31.3 30.0 - 36.0 g/dL   RDW 13.2 44.0 - 10.2 %   Platelets 182 150 - 400 K/uL   nRBC 0.0 0.0 - 0.2 %  Glucose, capillary     Status: None   Collection Time: 04/07/19 12:38 AM  Result Value Ref Range   Glucose-Capillary 98 70 - 99 mg/dL  Glucose, capillary     Status: None   Collection Time: 04/07/19  4:48 AM  Result Value Ref Range   Glucose-Capillary 98 70 - 99 mg/dL      [id number])  [name] Head Wo Contrast  Result Date: 04/05/2019 CLINICAL DATA:  Acute presentation with right-sided weakness and speech disturbance. EXAM: MRI HEAD WITHOUT CONTRAST MRA HEAD WITHOUT CONTRAST MRA NECK WITHOUT CONTRAST TECHNIQUE: Multiplanar, multiecho pulse sequences of the brain and surrounding structures were obtained without intravenous contrast. Angiographic images of the Circle of Willis  were obtained using MRA technique without intravenous contrast. Angiographic images of the neck were obtained using MRA technique without intravenous contrast. Carotid stenosis measurements (when applicable) are obtained utilizing NASCET criteria, using the distal internal carotid diameter as the denominator. COMPARISON:  Head CT same day.  MRI 09/03/2018. FINDINGS: MRI HEAD FINDINGS  Brain: Diffusion imaging confirms acute infarction in the left insula and posterior frontal region. The area of involvement measures about 3.5 cm in size. No mass effect or visible hemorrhage. No hydrocephalus. Chronic small-vessel ischemic changes of the deep white matter seen elsewhere. Vascular: Major vessels at the base of the brain show flow. Skull and upper cervical spine: Negative Sinuses/Orbits: Clear/normal Other: None MRA HEAD FINDINGS Both internal carotid arteries are patent through the skull base and siphon regions with atherosclerotic narrowing and irregularity in the siphon regions. Supraclinoid internal carotid arteries are widely patent. The anterior and middle cerebral vessels appear to show flow without visible large or medium vessel occlusion. Distal branch vessels do show some atherosclerotic irregularity. The left vertebral artery supplies the basilar. No antegrade flow seen in the right vertebral artery. The basilar artery shows atherosclerotic narrowing and irregularity diffusely. Posterior circulation branch vessels are patent, with both posterior cerebral arteries receiving most of there supply from the anterior circulation. There is long segment stenosis of the left P1 segment, but good flow seen distal to that. MRA NECK FINDINGS There is antegrade flow in both common carotid arteries. The study suffers from pronounced motion degradation. Antegrade flow is seen in both cervical internal carotid arteries, but accurate evaluation of the carotid bifurcations is not possible. There is antegrade flow in the left vertebral artery but none seen on the right. IMPRESSION: 3.5 cm region of acute infarction affecting the left insula and posterior frontal region. No evidence of gross hemorrhage. MR angiography of the neck markedly degraded by motion. Both internal carotid arteries show antegrade flow. No antegrade flow in the right vertebral artery. Intracranial MR angiography does not show any large  or medium vessel occlusion. Widespread atherosclerotic irregularity of the more distal branch vessels. Electronically Signed   By: Paulina Fusi M.D.   On: 04/05/2019 10:59   [name] Neck Wo Contrast  Result Date: 04/05/2019 CLINICAL DATA:  Acute presentation with right-sided weakness and speech disturbance. EXAM: MRI HEAD WITHOUT CONTRAST MRA HEAD WITHOUT CONTRAST MRA NECK WITHOUT CONTRAST TECHNIQUE: Multiplanar, multiecho pulse sequences of the brain and surrounding structures were obtained without intravenous contrast. Angiographic images of the Circle of Willis were obtained using MRA technique without intravenous contrast. Angiographic images of the neck were obtained using MRA technique without intravenous contrast. Carotid stenosis measurements (when applicable) are obtained utilizing NASCET criteria, using the distal internal carotid diameter as the denominator. COMPARISON:  Head CT same day.  MRI 09/03/2018. FINDINGS: MRI HEAD FINDINGS Brain: Diffusion imaging confirms acute infarction in the left insula and posterior frontal region. The area of involvement measures about 3.5 cm in size. No mass effect or visible hemorrhage. No hydrocephalus. Chronic small-vessel ischemic changes of the deep white matter seen elsewhere. Vascular: Major vessels at the base of the brain show flow. Skull and upper cervical spine: Negative Sinuses/Orbits: Clear/normal Other: None MRA HEAD FINDINGS Both internal carotid arteries are patent through the skull base and siphon regions with atherosclerotic narrowing and irregularity in the siphon regions. Supraclinoid internal carotid arteries are widely patent. The anterior and middle cerebral vessels appear to show flow without visible large or medium vessel occlusion. Distal branch vessels do show  some atherosclerotic irregularity. The left vertebral artery supplies the basilar. No antegrade flow seen in the right vertebral artery. The basilar artery shows atherosclerotic  narrowing and irregularity diffusely. Posterior circulation branch vessels are patent, with both posterior cerebral arteries receiving most of there supply from the anterior circulation. There is long segment stenosis of the left P1 segment, but good flow seen distal to that. MRA NECK FINDINGS There is antegrade flow in both common carotid arteries. The study suffers from pronounced motion degradation. Antegrade flow is seen in both cervical internal carotid arteries, but accurate evaluation of the carotid bifurcations is not possible. There is antegrade flow in the left vertebral artery but none seen on the right. IMPRESSION: 3.5 cm region of acute infarction affecting the left insula and posterior frontal region. No evidence of gross hemorrhage. MR angiography of the neck markedly degraded by motion. Both internal carotid arteries show antegrade flow. No antegrade flow in the right vertebral artery. Intracranial MR angiography does not show any large or medium vessel occlusion. Widespread atherosclerotic irregularity of the more distal branch vessels. Electronically Signed   By: Paulina Fusi M.D.   On: 04/05/2019 10:59   Mr Brain Wo Contrast  Result Date: 04/05/2019 CLINICAL DATA:  Acute presentation with right-sided weakness and speech disturbance. EXAM: MRI HEAD WITHOUT CONTRAST MRA HEAD WITHOUT CONTRAST MRA NECK WITHOUT CONTRAST TECHNIQUE: Multiplanar, multiecho pulse sequences of the brain and surrounding structures were obtained without intravenous contrast. Angiographic images of the Circle of Willis were obtained using MRA technique without intravenous contrast. Angiographic images of the neck were obtained using MRA technique without intravenous contrast. Carotid stenosis measurements (when applicable) are obtained utilizing NASCET criteria, using the distal internal carotid diameter as the denominator. COMPARISON:  Head CT same day.  MRI 09/03/2018. FINDINGS: MRI HEAD FINDINGS Brain: Diffusion  imaging confirms acute infarction in the left insula and posterior frontal region. The area of involvement measures about 3.5 cm in size. No mass effect or visible hemorrhage. No hydrocephalus. Chronic small-vessel ischemic changes of the deep white matter seen elsewhere. Vascular: Major vessels at the base of the brain show flow. Skull and upper cervical spine: Negative Sinuses/Orbits: Clear/normal Other: None MRA HEAD FINDINGS Both internal carotid arteries are patent through the skull base and siphon regions with atherosclerotic narrowing and irregularity in the siphon regions. Supraclinoid internal carotid arteries are widely patent. The anterior and middle cerebral vessels appear to show flow without visible large or medium vessel occlusion. Distal branch vessels do show some atherosclerotic irregularity. The left vertebral artery supplies the basilar. No antegrade flow seen in the right vertebral artery. The basilar artery shows atherosclerotic narrowing and irregularity diffusely. Posterior circulation branch vessels are patent, with both posterior cerebral arteries receiving most of there supply from the anterior circulation. There is long segment stenosis of the left P1 segment, but good flow seen distal to that. MRA NECK FINDINGS There is antegrade flow in both common carotid arteries. The study suffers from pronounced motion degradation. Antegrade flow is seen in both cervical internal carotid arteries, but accurate evaluation of the carotid bifurcations is not possible. There is antegrade flow in the left vertebral artery but none seen on the right. IMPRESSION: 3.5 cm region of acute infarction affecting the left insula and posterior frontal region. No evidence of gross hemorrhage. MR angiography of the neck markedly degraded by motion. Both internal carotid arteries show antegrade flow. No antegrade flow in the right vertebral artery. Intracranial MR angiography does not show any large or  medium vessel  occlusion. Widespread atherosclerotic irregularity of the more distal branch vessels. Electronically Signed   By: Paulina Fusi M.D.   On: 04/05/2019 10:59   US Renal  Result Date: 04/05/2019 CLINICAL DATA:  68 year old male with hypertension EXAM: RENAL / URINARY TRACT ULTRASOUND COMPLETE COMPARISON:  None. FINDINGS: Right Kidney: Length: 11.1 cm x 5.3 cm x 6.1 cm, 189 cc. Hydronephrosis. Flow confirmed in the hilum of the right kidney. Left Kidney: Length: 10.9 cm x 5.6 cm x 5.9 cm, 187 cc. Hydronephrosis. Flow confirmed in the hilum of the left kidney. Anechoic cystic structure on the inferior cortex measures 2.6 cm x 1.7 cm with through transmission and no internal color flow. Additional cystic lesion measures 1.1 cm x 1.1 cm x 1.0 cm, with through transmission and no internal flow. Bladder: Catheter in place within the bladder. IMPRESSION: Bilateral hydronephrosis. If there is concern for distal obstruction, CT may be indicated. Left-sided renal cysts. Electronically Signed   By: Gilmer Mor D.O.   On: 04/05/2019 15:39   Dg Chest Port 1 View  Result Date: 04/05/2019 CLINICAL DATA:  Altered behavior. EXAM: PORTABLE CHEST 1 VIEW COMPARISON:  None. FINDINGS: Lungs are adequately inflated and otherwise clear. Borderline cardiomegaly. Mediastinum is within normal. Fusion hardware over the lower cervical spine intact. Minimal degenerative change of the spine. IMPRESSION: No acute findings. Electronically Signed   By: Elberta Fortis M.D.   On: 04/05/2019 11:32   Ct Renal Stone Study  Result Date: 04/06/2019 CLINICAL DATA:  Hydronephrosis. EXAM: CT ABDOMEN AND PELVIS WITHOUT CONTRAST TECHNIQUE: Multidetector CT imaging of the abdomen and pelvis was performed following the standard protocol without IV contrast. COMPARISON:  Ultrasound of April 05, 2019. FINDINGS: Lower chest: No acute abnormality. Hepatobiliary: No focal liver abnormality is seen. No gallstones, gallbladder wall thickening, or biliary  dilatation. Pancreas: Unremarkable. No pancreatic ductal dilatation or surrounding inflammatory changes. Spleen: Normal in size without focal abnormality. Adrenals/Urinary Tract: Adrenal glands appear normal. Moderate bilateral hydroureteronephrosis is noted without evidence of renal or ureteral calculi. Catheter is noted in urinary bladder which appears to be decompressed. However, there appears to be significant diffuse wall thickening of urinary bladder which may be due to lack of distension, but is concerning for cystitis. Clinical correlation is recommended. Stomach/Bowel: Stomach is within normal limits. Appendix appears normal. No evidence of bowel wall thickening, distention, or inflammatory changes. Vascular/Lymphatic: Aortic atherosclerosis. No enlarged abdominal or pelvic lymph nodes. Reproductive: Prostate is unremarkable. Other: No abdominal wall hernia or abnormality. No abdominopelvic ascites. Musculoskeletal: No acute or significant osseous findings. IMPRESSION: Moderate bilateral hydroureteronephrosis is noted without evidence of obstructing calculus. Diffuse wall thickening of urinary bladder is noted which may be due to lack of distension, but is concerning for cystitis. Clinical correlation is recommended. Catheter is noted within urinary bladder. Aortic Atherosclerosis (ICD10-I70.0). Electronically Signed   By: Lupita Raider M.D.   On: 04/06/2019 09:24   Ct Head Code Stroke Wo Contrast  Result Date: 04/05/2019 CLINICAL DATA:  Code stroke. Right-sided weakness. Speech disturbance. Last seen normal 0730 hours. EXAM: CT HEAD WITHOUT CONTRAST TECHNIQUE: Contiguous axial images were obtained from the base of the skull through the vertex without intravenous contrast. COMPARISON:  09/03/2018 FINDINGS: Brain: No CT evidence of acute infarction. Chronic small-vessel ischemic changes of the cerebral hemispheric white matter. No mass lesion, hemorrhage, hydrocephalus or extra-axial collection.  Vascular: No definite hyperdense vessel. One could question slight hyperdensity in the left M1 segment. Skull: Negative Sinuses/Orbits: Clear/normal Other: None  ASPECTS [facility] Stroke Program Early CT Score) - Ganglionic level infarction (caudate, lentiform nuclei, internal capsule, insula, M1-M3 cortex): 6 - Supraganglionic infarction (M4-M6 cortex): 3 Total score (0-10 with 10 being normal): 9 IMPRESSION: 1. Possible early gray-white differentiation loss in the insula on the left. Chronic small-vessel ischemic changes. 2. One could question slight hyperdensity in the left M1 segment. This is not definite. 3. ASPECTS is 9 4. These results were communicated to Dr. Amada Jupiter at 10:01 amon 4/15/2020by text page via the [facility] messaging system. Electronically Signed   By: Paulina Fusi M.D.   On: 04/05/2019 10:04   Vas US Carotid  Result Date: 04/06/2019 Carotid Arterial Duplex Study Indications:  CVA. Risk Factors: Hypertension, hyperlipidemia, prior CVA. Limitations:  Patient positioning, snoring, body habitus Performing Technologist: Blanch Media RVS  Examination Guidelines: A complete evaluation includes B-mode imaging, spectral Doppler, color Doppler, and power Doppler as needed of all accessible portions of each vessel. Bilateral testing is considered an integral part of a complete examination. Limited examinations for reoccurring indications may be performed as noted.  Right Carotid Findings: +----------+--------+--------+--------+-----------+--------+             PSV cm/s EDV cm/s Stenosis Describe    Comments  +----------+--------+--------+--------+-----------+--------+  CCA Prox   80       16                homogeneous           +----------+--------+--------+--------+-----------+--------+  CCA Distal 164      24                homogeneous           +----------+--------+--------+--------+-----------+--------+  ICA Prox   188      34       1-39%    homogeneous            +----------+--------+--------+--------+-----------+--------+  ICA Distal 112      22                                      +----------+--------+--------+--------+-----------+--------+  ECA        170      11                                      +----------+--------+--------+--------+-----------+--------+ +----------+--------+-------+--------+-------------------+             PSV cm/s EDV cms Describe Arm Pressure (mmHG)  +----------+--------+-------+--------+-------------------+  Subclavian 77                                             +----------+--------+-------+--------+-------------------+ +---------+--------+--------+--------------+  Vertebral PSV cm/s EDV cm/s Not identified  +---------+--------+--------+--------------+  Left Carotid Findings: +----------+--------+--------+--------+-----------+--------+             PSV cm/s EDV cm/s Stenosis Describe    Comments  +----------+--------+--------+--------+-----------+--------+  CCA Prox   181      26                homogeneous           +----------+--------+--------+--------+-----------+--------+  CCA Distal 118      23  homogeneous           +----------+--------+--------+--------+-----------+--------+  ICA Prox   161      28       1-39%    homogeneous           +----------+--------+--------+--------+-----------+--------+  ICA Distal 152      30                                      +----------+--------+--------+--------+-----------+--------+  ECA        170      12                                      +----------+--------+--------+--------+-----------+--------+ +----------+--------+--------+--------+-------------------+  Subclavian PSV cm/s EDV cm/s Describe Arm Pressure (mmHG)  +----------+--------+--------+--------+-------------------+             121                                             +----------+--------+--------+--------+-------------------+ +---------+--------+--+--------+--+---------+  Vertebral PSV cm/s 52 EDV cm/s 15 Antegrade   +---------+--------+--+--------+--+---------+    Preliminary       Assessment/Plan: Diagnosis: 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 Does the need for close, 24 hr/day medical supervision in concert with the patient's rehab needs make it unreasonable for this patient to be served in a less intensive setting? Yes 2. Co-Morbidities requiring supervision/potential complications: CKD III, HTN, post-stroke sequelae 3. Due to bladder management, bowel management, safety, skin/wound care, disease management, medication administration, pain management and patient education, does the patient require 24 hr/day rehab nursing? Yes 4. Does the patient require coordinated care of a physician, rehab nurse, PT (1-2 hrs/day, 5 days/week), OT (1-2 hrs/day, 5 days/week) and SLP (1-2 hrs/day, 5 days/week) to address physical and functional deficits in the context of the above medical diagnosis(es)? Yes Addressing deficits in the following areas: balance, endurance, locomotion, strength, transferring, bowel/bladder control, bathing, dressing, feeding, grooming, toileting, cognition, language, swallowing and psychosocial support 5. Can the patient actively participate in an intensive therapy program of at least 3 hrs of therapy per day at least 5 days per week? Yes 6. The potential for patient to make measurable gains while on inpatient rehab is excellent 7. Anticipated functional outcomes upon discharge from inpatient rehab are modified independent and supervision  with PT, modified independent and supervision with OT, supervision and min assist with SLP. 8. Estimated rehab length of stay to reach the above functional goals is: 13-20 days 9. Anticipated D/C setting: Home 10. Anticipated post D/C treatments: HH therapy 11. Overall Rehab/Functional Prognosis: good  RECOMMENDATIONS: This patient's condition is appropriate for continued rehabilitative care in the  following setting: CIR Patient has agreed to participate in recommended program. N/A Note that insurance prior authorization may be required for reimbursement for recommended care.  Comment: Rehab Admissions Coordinator to follow up.  Thanks,  Ranelle Oyster, MD, [place]  I have personally performed a face to face diagnostic evaluation of this patient. Additionally, I have examined pertinent labs and radiographic images. I have reviewed and concur with the physician assistant's documentation above.    Mcarthur Rossetti Angiulli, PA-C 04/07/2019        Revision  History                        Routing History

## 2019-04-10 NOTE — Progress Notes (Signed)
Notified MD, unable to give pt Nifedipine as med cant be crushed, also notified, pt having spikes in HR up to 160s, dennies discomfort. [name]

## 2019-04-10 NOTE — Progress Notes (Signed)
Patient ID: Gregory [name], male   DOB: 12-11-1951, 68 y.o.   MRN: [id number] [facility] Progress Note   Assessment/ Plan:   1.  Acute kidney injury on chronic kidney disease stage III:  With bladder outlet obstruction/obstructive uropathy - to leave indwelling Foley until seen by urology as outpatient.   - Continue supportive care  - Renal function slowly improving  - It is highly likely that he has had this obstruction for >2 weeks and may have significant residual renal injury  2.  Metabolic alkalosis: Possibly iatrogenic following sodium bicarbonate loading in the early phase of his hospitalization for management of metabolic acidosis; improving  3.  Hypernatremia: Limited access to free water due to dysphagia precautions.  On D5W.    4.  Hypertension: acceptable   5.  Anemia: Stable; Replete iron stores and stable hemoglobin/hematocrit with resolution of hematuria.   6.  Elevated troponin levels: Trending down last eval; per primary team   7.  Acute CVA with right-sided weakness/expressive aphasia: s/p speech eval   Subjective:   Pt with 1.7 liters UOP charted last 24 hours.     Objective:   BP (!) 116/97   Pulse 77   Temp 97.6 F (36.4 C) (Axillary)   Resp 18   Wt 88.8 kg   SpO2 99%   BMI 33.60 kg/m   Intake/Output Summary (Last 24 hours) at 04/10/2019 9432 Last data filed at 04/10/2019 0037 Gross per 24 hour  Intake 1982.49 ml  Output 1700 ml  Net 282.49 ml   Weight change:   Physical Exam: Gen: elderly male in bed in no acute distress  CVS: RRR; S1, S2 Resp: clear to auscultation bilaterally Abd: Soft, obese, nontender Ext: no edema lower extremities   Imaging: No results found.  Labs: BMET Recent Labs  Lab 04/06/19 0015 04/06/19 0818 04/06/19 1337 04/06/19 2019 04/07/19 0906 04/08/19 0357 04/09/19 0900  NA 140 141 143 143 145 147* 142  K 6.1* 5.7* 5.4* 5.5* 5.0 4.8 4.3  CL 110 106 105 103 98 96* 96*  CO2 16* 18* 20* 24 32  33* 32  GLUCOSE 112* 94 97 109* 112* 103* 154*  BUN 154* 147* 144* 138* 129* 127* 117*  CREATININE 9.56* 8.83* 8.54* 8.05* 7.33* 7.04* 6.01*  CALCIUM 8.7* 8.2* 8.3* 8.2* 7.9* 8.1* 8.1*  PHOS 6.2*  --   --   --   --   --   --    CBC Recent Labs  Lab 04/05/19 0946 04/06/19 1337 04/06/19 2019 04/07/19 0906 04/08/19 0357  WBC 8.8 8.3 10.1 14.0* 10.7*  NEUTROABS 6.9  --   --   --   --   HGB 10.5* 7.8* 9.9* 9.4* 9.5*  HCT 36.3* 24.4* 31.6* 30.4* 30.6*  MCV 97.8 88.7 88.3 88.6 90.5  PLT 179 179 182 164 150    Medications:    . aspirin  300 mg Rectal Daily   Or  . aspirin  325 mg Oral Daily  . atorvastatin  80 mg Oral q1800  . metoprolol tartrate  25 mg Oral TID  . NIFEdipine  30 mg Oral Daily   [name] 04/10/2019 6:28 am

## 2019-04-10 NOTE — Progress Notes (Signed)
Patient ID: Gregory [name], male   DOB: [birth year], 68 y.o.   MRN: [id number]  Patient arrived to floor via bed. Alert and oriented, patient oriented to floor, no complaints of pain, vss

## 2019-04-10 NOTE — Discharge Instructions (Signed)
Loop recorder implant wound/site care instructions Keep incision clean and dry for 3 days. Leave steri-strips (little pieces of tape) on until seen in the office for wound check appointment. Call the office [phone]) for redness, drainage, swelling, or fever.   D/c Instructions 1)BMP daily--- please have nephrology follow patient while at inpatient rehab  2) bladder outlet obstruction/obstructive uropathy--- please keep Foley catheter in place, do not remove please--- patient will need urology follow-up post discharge  3) please consider speech reevaluation  4)Avoid Nsaids

## 2019-04-11 ENCOUNTER — Encounter (HOSPITAL_COMMUNITY): Payer: Self-pay

## 2019-04-11 ENCOUNTER — Inpatient Hospital Stay (HOSPITAL_COMMUNITY): Payer: Medicare HMO | Admitting: Occupational Therapy

## 2019-04-11 ENCOUNTER — Inpatient Hospital Stay (HOSPITAL_COMMUNITY): Payer: Medicare HMO

## 2019-04-11 ENCOUNTER — Inpatient Hospital Stay (HOSPITAL_COMMUNITY): Payer: Medicare HMO | Admitting: Speech Pathology

## 2019-04-11 DIAGNOSIS — G8191 Hemiplegia, unspecified affecting right dominant side: Secondary | ICD-10-CM

## 2019-04-11 DIAGNOSIS — R4701 Aphasia: Secondary | ICD-10-CM

## 2019-04-11 DIAGNOSIS — I69391 Dysphagia following cerebral infarction: Secondary | ICD-10-CM

## 2019-04-11 LAB — CBC WITH DIFFERENTIAL/PLATELET
Abs Immature Granulocytes: 0.14 10*3/uL — ABNORMAL HIGH (ref 0.00–0.07)
Basophils Absolute: 0 10*3/uL (ref 0.0–0.1)
Basophils Relative: 0 %
Eosinophils Absolute: 0.3 10*3/uL (ref 0.0–0.5)
Eosinophils Relative: 3 %
HCT: 32.5 % — ABNORMAL LOW (ref 39.0–52.0)
Hemoglobin: 9.6 g/dL — ABNORMAL LOW (ref 13.0–17.0)
Immature Granulocytes: 1 %
Lymphocytes Relative: 20 %
Lymphs Abs: 2 10*3/uL (ref 0.7–4.0)
MCH: 27.7 pg (ref 26.0–34.0)
MCHC: 29.5 g/dL — ABNORMAL LOW (ref 30.0–36.0)
MCV: 93.9 fL (ref 80.0–100.0)
Monocytes Absolute: 1 10*3/uL (ref 0.1–1.0)
Monocytes Relative: 10 %
Neutro Abs: 6.7 10*3/uL (ref 1.7–7.7)
Neutrophils Relative %: 66 %
Platelets: 169 10*3/uL (ref 150–400)
RBC: 3.46 MIL/uL — ABNORMAL LOW (ref 4.22–5.81)
RDW: 13.3 % (ref 11.5–15.5)
WBC: 10.2 10*3/uL (ref 4.0–10.5)
nRBC: 0 % (ref 0.0–0.2)

## 2019-04-11 LAB — COMPREHENSIVE METABOLIC PANEL
ALT: 75 U/L — ABNORMAL HIGH (ref 0–44)
AST: 144 U/L — ABNORMAL HIGH (ref 15–41)
Albumin: 2.7 g/dL — ABNORMAL LOW (ref 3.5–5.0)
Alkaline Phosphatase: 94 U/L (ref 38–126)
Anion gap: 15 (ref 5–15)
BUN: 100 mg/dL — ABNORMAL HIGH (ref 8–23)
CO2: 28 mmol/L (ref 22–32)
Calcium: 8.1 mg/dL — ABNORMAL LOW (ref 8.9–10.3)
Chloride: 96 mmol/L — ABNORMAL LOW (ref 98–111)
Creatinine, Ser: 5.13 mg/dL — ABNORMAL HIGH (ref 0.61–1.24)
GFR calc Af Amer: 12 mL/min — ABNORMAL LOW (ref >60)
GFR calc non Af Amer: 11 mL/min — ABNORMAL LOW (ref >60)
Glucose, Bld: 104 mg/dL — ABNORMAL HIGH (ref 70–99)
Potassium: 4.8 mmol/L (ref 3.5–5.1)
Sodium: 139 mmol/L (ref 135–145)
Total Bilirubin: 0.5 mg/dL (ref 0.3–1.2)
Total Protein: 6.9 g/dL (ref 6.5–8.1)

## 2019-04-11 MED ORDER — CLOPIDOGREL BISULFATE 75 MG PO TABS
75.0000 mg | ORAL_TABLET | Freq: Every day | ORAL | Status: DC
  Administered 2019-04-11 – 2019-04-14 (×4): 75 mg via ORAL
  Filled 2019-04-11 (×4): qty 1

## 2019-04-11 MED ORDER — SODIUM CHLORIDE 0.9 % IV SOLN
INTRAVENOUS | Status: DC
  Administered 2019-04-11 – 2019-04-13 (×3): via INTRAVENOUS

## 2019-04-11 NOTE — Progress Notes (Signed)
Patient ID: Gregory [name], male   DOB: 1951-11-16, 68 y.o.   MRN: [id number] [facility] Progress Note   Assessment/ Plan:   1.  Acute kidney injury on chronic kidney disease stage III:  With bladder outlet obstruction/obstructive uropathy - to leave indwelling Foley until seen by urology as outpatient.   - Continue supportive care  - Renal function slowly improving  - It is highly likely that he has had this obstruction for >2 weeks and may have significant residual renal injury - Transition to normal saline from 1/2 NS  2.  Hypernatremia: Limited access to free water due to dysphagia precautions.  Resolved.  3.  Hypertension: acceptable   4.  Anemia: Stable; Replete iron stores and stable hemoglobin/hematocrit with resolution of hematuria.   5.  Elevated troponin levels: Trending down last eval  6.  Acute CVA with right-sided weakness/expressive aphasia: s/p speech eval and for rehab   Subjective:   Patient moved to rehab.  He has been on 1/2 NS at 75 ml/hr.   Review of systems:  Unable to obtain 2/2 aphasia    Objective:   BP (!) 130/103 (BP Location: Left Arm)   Pulse 96   Temp 98 F (36.7 C) (Oral)   Resp 16   Ht 5\' 4"  (1.626 m)   Wt 85.6 kg   SpO2 (!) 87%   BMI 32.39 kg/m   Intake/Output Summary (Last 24 hours) at 04/11/2019 [id number] Last data filed at 04/11/2019 0900 Gross per 24 hour  Intake 1220 ml  Output 550 ml  Net 670 ml   Weight change:   Physical Exam: Gen: elderly male in bed in no acute distress  CVS: RRR; S1, S2 Resp: clear to auscultation bilaterally Abd: Soft, obese, nontender Ext: no edema lower extremities  Aphasia; follows some simple motor commands (ie. Show me your thumb)  Imaging: No results found.  Labs: BMET Recent Labs  Lab 04/06/19 0015  04/06/19 1337 04/06/19 2019 04/07/19 0906 04/08/19 0357 04/09/19 0900 04/10/19 0518 04/11/19 0610  NA 140   < > 143 143 145 147* 142 142 139  K 6.1*   < > 5.4* 5.5* 5.0  4.8 4.3 4.5 4.8  CL 110   < > 105 103 98 96* 96* 97* 96*  CO2 16*   < > 20* 24 32 33* 32 30 28  GLUCOSE 112*   < > 97 109* 112* 103* 154* 120* 104*  BUN 154*   < > 144* 138* 129* 127* 117* 104* 100*  CREATININE 9.56*   < > 8.54* 8.05* 7.33* 7.04* 6.01* 5.78* 5.13*  CALCIUM 8.7*   < > 8.3* 8.2* 7.9* 8.1* 8.1* 8.2* 8.1*  PHOS 6.2*  --   --   --   --   --   --   --   --    < > = values in this interval not displayed.   CBC Recent Labs  Lab 04/05/19 0946  04/07/19 0906 04/08/19 0357 04/10/19 0518 04/11/19 0610  WBC 8.8   < > 14.0* 10.7* 10.9* 10.2  NEUTROABS 6.9  --   --   --   --  6.7  HGB 10.5*   < > 9.4* 9.5* 10.0* 9.6*  HCT 36.3*   < > 30.4* 30.6* 33.5* 32.5*  MCV 97.8   < > 88.6 90.5 93.6 93.9  PLT 179   < > 164 150 183 169   < > = values in this interval not displayed.  Medications:    . aspirin  300 mg Rectal Daily   Or  . aspirin  325 mg Oral Daily  . atorvastatin  80 mg Oral q1800  . clopidogrel  75 mg Oral Daily   Gregory [name] 04/11/2019

## 2019-04-11 NOTE — Progress Notes (Addendum)
Physical Therapy Session Note  Patient Details  Name: Gregory [name] MRN: [id number] Date of Birth: 07/19/1951  Today's Date: 04/11/2019 PT Individual Time: 1510-1540 PT Individual Time Calculation (min): 30 min   Short Term Goals: Week 1:  PT Short Term Goal 1 (Week 1): Pt will perform bed<>chair transfer with mod assist PT Short Term Goal 2 (Week 1): Pt will initiate gait training PT Short Term Goal 3 (Week 1): Pt will perform bed mobility with min assist  Skilled Therapeutic Interventions/Progress Updates:     Patient in bed asleep upon PT arrival. Patient easily aroused with PT called his name and  reported he was very fatigued from earlier sessions today, agreeable to PT session in the room and ending back in bed  Therapeutic Activity: Bed Mobility: Patient performed supine to sit with min A for trunk management with HOB elevated 45 degrees and use of bed rail. Provided verbal cues for moving his feet off of the bed before sitting up and pushing through B UE's to sit up. Performed sit to supine and scooting R and up in the bed with mod A for LE management with bed flat and use of bed rails. Provided cues for using bridging technique to scoot.  Neuromuscular Re-ed: Patient performed sitting balance on the EOB x6 minutes with CGA-min A for trunk support. Tends to lean to the R without L UE support on the bed rail, able to self correct without UE support and maintain for approximately 10 seconds before leaning to the R again. Able to maintain midline with supervision with R UE support on bed rail for >1 min.  Performed sit<>stand prep, coming halfway to standing and returning to sitting, with mod-max A with PT in front blocking L knee x5. Patient came closer to full standing with each trial.   Patient in bed at end of session with breaks locked, bed alarm set, and all needs within reach.    Therapy Documentation Precautions:  Precautions Precautions: Fall Restrictions Weight  Bearing Restrictions: No Vital Signs: Therapy Vitals Oxygen Therapy SpO2: 95-99% throughout session O2 Device: Nasal Cannula O2 Flow Rate (L/min): 2 L/min Pain: Pain Assessment Pain Scale: Faces Faces Pain Scale: No hurt   Therapy/Group: Individual Therapy  Helayne Seminole, PT, DPT  04/11/2019, 4:25 PM

## 2019-04-11 NOTE — Evaluation (Signed)
Physical Therapy Assessment and Plan  Patient Details  Name: Gregory [name] MRN: [id number] Date of Birth: 13-Oct-1951  PT Diagnosis: Cognitive deficits, Difficulty walking, Edema, Hemiparesis dominant, Hypertonia, Impaired sensation and Muscle weakness Rehab Potential: Good ELOS: 3-4 weeks   Today's Date: 04/11/2019 PT Individual Time: 4098-1191 PT Individual Time Calculation (min): 60 min    Problem List:  Patient Active Problem List   Diagnosis Date Noted  . Embolic cerebral infarction ([place]) 04/10/2019  . Cerebral infarction due to embolism of left middle cerebral artery ([place])   . Stroke (cerebrum) ([place]) 04/05/2019  . Demand ischemia ([place]) 04/05/2019  . 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 Pre-diabetes 09/05/2018  . CKD (chronic kidney disease) stage 3, GFR 30-59 ml/min (HCC) 09/05/2018  . Bilateral carotid artery stenosis 09/05/2018  . Essential hypertension 09/05/2018  . TIA (transient ischemic attack) 09/04/2018    Past Medical History:  Past Medical History:  Diagnosis Date  . AKI (acute kidney injury) ([place])   . CKD (chronic kidney disease) stage 2, GFR 60-89 ml/min   . CVA (cerebral vascular accident) ([place])   . Hyperlipidemia   . Hypertension   . PVD (peripheral vascular disease) ([place])    Past Surgical History:  Past Surgical History:  Procedure Laterality Date  . AORTIC ARCH ANGIOGRAPHY N/A 09/09/2018   Procedure: AORTIC ARCH ANGIOGRAPHY;  Surgeon: Elam Dutch, MD;  Location: [place] [facility];  Service: Cardiovascular;  Laterality: N/A;  . CAROTID ANGIOGRAPHY Bilateral 09/09/2018   Procedure: CAROTID ANGIOGRAPHY;  Surgeon: Elam Dutch, MD;  Location: [place] [facility];  Service: Cardiovascular;  Laterality: Bilateral;  . LOOP RECORDER INSERTION N/A 04/07/2019   Procedure: LOOP RECORDER INSERTION;  Surgeon: Evans Lance, MD;  Location: [place] [facility];  Service: Cardiovascular;  Laterality: N/A;    Assessment & Plan Clinical  Impression: Patient is a 68 y.o. year old male with history of chronic kidney disease stage III with creatinine baseline 1.6-1.7, hypertension maintained on HCTZ and lisinopril, hyperlipidemia. Per chart review patient lives with spouse reportedly independent prior to admission. Presented 04/05/2019 with acute onset of right sided weakness and slurred speech. Cranial CT scan showed possible early gray- white differentiation loss in the insula on the left. Chronic small vessel ischemic changes. MRI/MRA showed a 3.5 centimeter region of acute infarction affecting the left insula and posterior frontal region. No evidence of gross hemorrhage. MRA negative. Patient did not receive TPA. Noted findings of hyperkalemia 6.8 as well as elevated creatinine 10.4 rate from baseline 1.70, troponin 3.29. EKG with ST changes. Carotid Dopplers 40-59% ICA stenosis bilaterally.Echocardiogram with ejection fraction of 60% and normal systolic function. Cardiology service is consulted for suspect non-STEMI medical management recommended. Patient did undergo placement of loop recorder 04/07/2019 per Dr. Lovena Le. Renal ultrasound showed bilateral hydronephrosis with nephrology services consulted as well as urology. A Foley catheter tube was placed and would remain in place until follow-up outpatient urology.renal function continues to improve 5.78 creatinine 04/10/2019. Maintained on aspirin for CVA prophylaxis. Dysphagia #1 honey thick liquid diet. Therapy evaluations completed with recommendations of physical medicine rehabilitation consult. Patient was admitted for a comprehensive rehabilitation program..  Patient transferred to CIR on 04/10/2019 .   Patient currently requires max with mobility secondary to muscle weakness, decreased coordination, limited communication and decreased standing balance, decreased postural control, hemiplegia and decreased balance strategies.  Prior to hospitalization, patient was modified independent   with mobility and lived with   in a House home.  Home  access is 2Stairs to enter.  Patient will benefit from skilled PT intervention to maximize safe functional mobility, minimize fall risk and decrease caregiver burden for planned discharge home with 24 hour assist.  Anticipate patient will benefit from follow up [facility] at discharge.  PT - End of Session Activity Tolerance: Tolerates 30+ min activity with multiple rests Endurance Deficit: Yes PT Assessment Rehab Potential (ACUTE/IP ONLY): Good PT Barriers to Discharge: Inaccessible home environment PT Barriers to Discharge Comments: 2 STE PT Patient demonstrates impairments in the following area(s): Balance;Behavior;Edema;Endurance;Motor;Pain;Perception;Safety;Sensory;Skin Integrity PT Transfers Functional Problem(s): Bed Mobility;Bed to Chair;Car;Furniture;Floor PT Locomotion Functional Problem(s): Ambulation;Wheelchair Mobility;Stairs PT Plan PT Intensity: Minimum of 1-2 x/day ,45 to 90 minutes PT Frequency: 5 out of 7 days PT Duration Estimated Length of Stay: 3-4 weeks PT Treatment/Interventions: Ambulation/gait training;Disease management/prevention;Pain management;Stair training;Patient/family education;DME/adaptive equipment instruction;Balance/vestibular training;Therapeutic Activities;Wheelchair propulsion/positioning;Cognitive remediation/compensation;Functional electrical stimulation;Psychosocial support;Therapeutic Exercise;Functional mobility training;Community reintegration;Skin care/wound management;UE/LE Strength taining/ROM;Discharge planning;Neuromuscular re-education;Splinting/orthotics;UE/LE Coordination activities PT Transfers Anticipated Outcome(s): min assist PT Locomotion Anticipated Outcome(s): mod assist PT Recommendation Follow Up Recommendations: Home health PT Patient destination: Home Equipment Recommended: To be determined  Skilled Therapeutic Intervention Evaluation completed (see details above and below) with  education on PT POC and goals and individual treatment initiated with focus on functional mobility, sit<>stand, and transfers. Pt supine in bed upon PT arrival, agreeable to therapy tx and denies pain. Examination performed as detailed below, PLOF and strength/sensation testing limited secondary to communication/cognitive deficits. Pt transferred to sitting EOB with mod assist and cues for techniques/hand placement. Pt performed sit<>stand from EOB with max assist and L UE support on w/c. Pt performed sit<>stand within stedy this session from EOB with max assist, cues for techniques. Pt performed squat pivot to the w/c with max assist, cues for techniques. Pt transported to gym (w/c propulsion dependent secondary to w/c height, will need shorter w/c). Pt attempted to perform gait at rail but unable, pt sits back down shaking head and nods yes when asked if he is too tired. Pt unable to initiate pre-gait at rail as well. Pt performed x 2 sit<>stands at rail with max assist. Pt transported back to room and left in w/c with needs in reach and chair alarm set.      PT Evaluation Precautions/Restrictions Precautions Precautions: Fall Restrictions Weight Bearing Restrictions: No General   Vital SignsTherapy Vitals Temp: 98 F (36.7 C) Temp Source: Oral Pulse Rate: 77 Resp: 18 BP: 136/67 Patient Position (if appropriate): Lying Oxygen Therapy SpO2: 99 % O2 Device: Nasal Cannula O2 Flow Rate (L/min): 2 L/min Pain  denies pain Home Living/Prior Functioning Home Living Available Help at Discharge: Family Type of Home: House Home Access: Stairs to enter Technical brewer of Steps: 2 Entrance Stairs-Rails: (pt unable to report) Home Layout: One level Additional Comments: pt with great difficulty speaking but able to nod "yes" and shake head "no" in response to some questions - pt lives with his spouse and was previously independent Prior Function Level of Independence: Independent with  homemaking with ambulation;Independent with gait  Able to Take Stairs?: Yes Driving: Yes Vocation: Retired Comments: difficult to assess PLOF secondary to communication impairments, will follow up with family Cognition Overall Cognitive Status: Difficult to assess Sensation Sensation Light Touch: Impaired Detail Central sensation comments: sensation difficult to formally assess, R LE/UE sensation appears impaired compared to L side Additional Comments: sensation difficult to formally assess, R LE/UE sensation appears impaired compared to L side Coordination Gross Motor Movements are Fluid and Coordinated: No Fine  Motor Movements are Fluid and Coordinated: No Coordination and Movement Description: impaired secondary to R hypertonia and hemiparesis Motor  Motor Motor: Hemiplegia;Abnormal tone Motor - Skilled Clinical Observations: R hypertonia and hemiparesis  Mobility Bed Mobility Bed Mobility: Rolling Right;Rolling Left;Supine to Sit;Sit to Supine Rolling Right: Moderate Assistance - Patient 50-74% Rolling Left: Moderate Assistance - Patient 50-74% Supine to Sit: Moderate Assistance - Patient 50-74% Sit to Supine: Moderate Assistance - Patient 50-74% Transfers Transfers: Sit to Stand;Stand to Sit;Squat Pivot Transfers;Transfer via Geophysicist/field seismologist Sit to Stand: Maximal Assistance - Patient 25-49% Stand to Sit: Maximal Assistance - Patient 25-49% Squat Pivot Transfers: Maximal Assistance - Patient 25-49% Transfer (Assistive device): None Transfer via Lift Equipment: Stedy Trunk/Postural Assessment  Cervical Assessment Cervical Assessment: Within Functional Limits Thoracic Assessment Thoracic Assessment: Exceptions to WFL(rounded shoulders) Lumbar Assessment Lumbar Assessment: Exceptions to WFL(posterior pelvic tilt) Postural Control Postural Control: Deficits on evaluation  Balance Balance Balance Assessed: Yes Static Sitting Balance Static Sitting - Level of Assistance:  5: Stand by assistance Dynamic Sitting Balance Dynamic Sitting - Level of Assistance: 4: Min assist;3: Mod assist Static Standing Balance Static Standing - Level of Assistance: 2: Max assist Dynamic Standing Balance Dynamic Standing - Level of Assistance: 2: Max assist Extremity Assessment  RLE Assessment RLE Assessment: Exceptions to [facility] RLE Strength Right Hip Flexion: 3-/5 Right Hip Extension: 3-/5 Right Knee Flexion: 2+/5 Right Knee Extension: 3-/5 Right Ankle Dorsiflexion: 2+/5 Right Ankle Plantar Flexion: 3-/5 RLE Tone RLE Tone Comments: increased tone LLE Assessment LLE Assessment: Within Functional Limits    Refer to Care Plan for Long Term Goals  Recommendations for other services: None   Discharge Criteria: Patient will be discharged from PT if patient refuses treatment 3 consecutive times without medical reason, if treatment goals not met, if there is a change in medical status, if patient makes no progress towards goals or if patient is discharged from hospital.  The above assessment, treatment plan, treatment alternatives and goals were discussed and mutually agreed upon: No family available/patient unable  Netta Corrigan, PT, DPT 04/11/2019, 9:47 AM

## 2019-04-11 NOTE — Care Management Note (Signed)
[facility]patient Rehabilitation Center Individual Statement of Services  Patient Name:  Gregory [name]  Date:  04/11/2019  Welcome to the [facility].  Our goal is to provide you with an individualized program based on your diagnosis and situation, designed to meet your specific needs.  With this comprehensive rehabilitation program, you will be expected to participate in at least 3 hours of rehabilitation therapies Monday-Friday, with modified therapy programming on the weekends.  Your rehabilitation program will include the following services:  Physical Therapy (PT), Occupational Therapy (OT), Speech Therapy (ST), 24 hour per day rehabilitation nursing, Therapeutic Recreaction (TR), Neuropsychology, Case Management (Social Worker), Rehabilitation Medicine, Nutrition Services and Pharmacy Services  Weekly team conferences will be held on Wednesday to discuss your progress.  Your Social Worker will talk with you frequently to get your input and to update you on team discussions.  Team conferences with you and your family in attendance may also be held.  Expected length of stay: 3-4 weeks  Overall anticipated outcome: min-mod level  Depending on your progress and recovery, your program may change. Your Social Worker will coordinate services and will keep you informed of any changes. Your Social Worker's name and contact numbers are listed  below.  The following services may also be recommended but are not provided by the Inpatient Rehabilitation Center:   Driving Evaluations  Home Health Rehabiltiation Services  Outpatient Rehabilitation Services   Arrangements will be made to provide these services after discharge if needed.  Arrangements include referral to agencies that provide these services.  Your insurance has been verified to be:  [employer] Your primary doctor is:    Pertinent information will be shared with your doctor and your insurance company.  Social  Worker:  Dossie Der, SW [phone] or [phone]  Information discussed with and copy given to patient by: Lucy Chris, 04/11/2019, 10:17 AM

## 2019-04-11 NOTE — Discharge Instructions (Signed)
Inpatient Rehab Discharge Instructions  Caren HazyDonald Lee Witthuhn Discharge date and time: No discharge date for patient encounter.   Activities/Precautions/ Functional Status: Activity: activity as tolerated Diet:  Wound Care: none needed Functional status:  ___ No restrictions     ___ Walk up steps independently ___ 24/7 supervision/assistance   ___ Walk up steps with assistance ___ Intermittent supervision/assistance  ___ Bathe/dress independently ___ Walk with walker     _x__ Bathe/dress with assistance ___ Walk Independently    ___ Shower independently ___ Walk with assistance    ___ Shower with assistance ___ No alcohol     ___ Return to work/school ________  Special Instructions:  No driving, smoking or alcohol STROKE/TIA DISCHARGE INSTRUCTIONS SMOKING Cigarette smoking nearly doubles your risk of having a stroke & is the single most alterable risk factor  If you smoke or have smoked in the last 12 months, you are advised to quit smoking for your health.  Most of the excess cardiovascular risk related to smoking disappears within a year of stopping.  Ask you doctor about anti-smoking medications  [place] Quit Line: 1-800-QUIT NOW  Free Smoking Cessation Classes [phone]  CHOLESTEROL Know your levels; limit fat & cholesterol in your diet  Lipid Panel     Component Value Date/Time   CHOL 160 04/06/2019 0015   TRIG 154 (H) 04/06/2019 0015   HDL 19 (L) 04/06/2019 0015   CHOLHDL 8.4 04/06/2019 0015   VLDL 31 04/06/2019 0015   LDLCALC 110 (H) 04/06/2019 0015      Many patients benefit from treatment even if their cholesterol is at goal.  Goal: Total Cholesterol (CHOL) less than 160  Goal:  Triglycerides (TRIG) less than 150  Goal:  HDL greater than 40  Goal:  LDL (LDLCALC) less than 100   BLOOD PRESSURE American Stroke Association blood pressure target is less that 120/80 mm/Hg  Your discharge blood pressure is:     Monitor your blood pressure  Limit your salt and  alcohol intake  Many individuals will require more than one medication for high blood pressure  DIABETES (A1c is a blood sugar average for last 3 months) Goal HGBA1c is under 7% (HBGA1c is blood sugar average for last 3 months)  Diabetes: No known diagnosis of diabetes    Lab Results  Component Value Date   HGBA1C 6.0 (H) 04/06/2019     Your HGBA1c can be lowered with medications, healthy diet, and exercise.  Check your blood sugar as directed by your physician  Call your physician if you experience unexplained or low blood sugars.  PHYSICAL ACTIVITY/REHABILITATION Goal is 30 minutes at least 4 days per week  Activity: Increase activity slowly, Therapies: Physical Therapy: Home Health Return to work:   Activity decreases your risk of heart attack and stroke and makes your heart stronger.  It helps control your weight and blood pressure; helps you relax and can improve your mood.  Participate in a regular exercise program.  Talk with your doctor about the best form of exercise for you (dancing, walking, swimming, cycling).  DIET/WEIGHT Goal is to maintain a healthy weight  Your discharge diet is:  Diet Order            DIET - DYS 1 Room service appropriate? Yes; Fluid consistency: Honey Thick  Diet effective now              liquids Your height is:  Height: 5\' 4"  (162.6 cm) Your current weight is: Weight: 85.6 kg Your  Body Mass Index (BMI) is:     Following the type of diet specifically designed for you will help prevent another stroke.  Your goal weight range is:    Your goal Body Mass Index (BMI) is 19-24.  Healthy food habits can help reduce 3 risk factors for stroke:  High cholesterol, hypertension, and excess weight.  RESOURCES Stroke/Support Group:  Call [phone]   STROKE EDUCATION PROVIDED/REVIEWED AND GIVEN TO PATIENT Stroke warning signs and symptoms How to activate emergency medical system (call 911). Medications prescribed at discharge. Need for  follow-up after discharge. Personal risk factors for stroke. Pneumonia vaccine given:  Flu vaccine given:  My questions have been answered, the writing is legible, and I understand these instructions.  I will adhere to these goals & educational materials that have been provided to me after my discharge from the hospital.     My questions have been answered and I understand these instructions. I will adhere to these goals and the provided educational materials after my discharge from the hospital.  Patient/Caregiver Signature _______________________________ Date __________  Clinician Signature _______________________________________ Date __________  Please bring this form and your medication list with you to all your follow-up doctor's appointments.

## 2019-04-11 NOTE — Evaluation (Signed)
Speech Language Pathology Assessment and Plan  Patient Details  Name: Gregory [name] MRN: [id number] Date of Birth: 1951-07-31  SLP Diagnosis: Aphasia;Apraxia;Dysphagia;Speech and Language deficits  Rehab Potential: Fair ELOS: 3-4 weeks    Today's Date: 04/11/2019 SLP Individual Time: 1100-1200 SLP Individual Time Calculation (min): 60 min   Problem List:  Patient Active Problem List   Diagnosis Date Noted  . 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 Cerebral infarction due to embolism of left middle cerebral artery ([place])   . Stroke (cerebrum) ([place]) 04/05/2019  . Demand ischemia ([place]) 04/05/2019  . 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 Pre-diabetes 09/05/2018  . CKD (chronic kidney disease) stage 3, GFR 30-59 ml/min (HCC) 09/05/2018  . Bilateral carotid artery stenosis 09/05/2018  . Essential hypertension 09/05/2018  . TIA (transient ischemic attack) 09/04/2018   Past Medical History:  Past Medical History:  Diagnosis Date  . AKI (acute kidney injury) ([place])   . CKD (chronic kidney disease) stage 2, GFR 60-89 ml/min   . CVA (cerebral vascular accident) ([place])   . Hyperlipidemia   . Hypertension   . PVD (peripheral vascular disease) ([place])    Past Surgical History:  Past Surgical History:  Procedure Laterality Date  . AORTIC ARCH ANGIOGRAPHY N/A 09/09/2018   Procedure: AORTIC ARCH ANGIOGRAPHY;  Surgeon: Elam Dutch, MD;  Location: [facility];  Service: Cardiovascular;  Laterality: N/A;  . CAROTID ANGIOGRAPHY Bilateral 09/09/2018   Procedure: CAROTID ANGIOGRAPHY;  Surgeon: Elam Dutch, MD;  Location: [facility];  Service: Cardiovascular;  Laterality: Bilateral;  . LOOP RECORDER INSERTION N/A 04/07/2019   Procedure: LOOP RECORDER INSERTION;  Surgeon: Evans Lance, MD;  Location: [facility];  Service: Cardiovascular;  Laterality: N/A;    Assessment / Plan / Recommendation Clinical Impression 68 year old male with  history of chronic kidney disease, stage III, HTN, HLD who was admitted on 04/05/2019 with sudden onset of right sided weakness and inability to talk. MRI showed  Acute infarction affecting the left insula dn posterior frontal region. No hemorrhage. Pt presents with resultant aphasia with difficulty following one-step commands, dysphagia, right sided weakness with right inattention. Therapy has been ongoing and CIR was recommended due to functional decline.  SLP evaluation completed on 04/11/2019 with the following results:   BSE:  Pt underwent MBS on 04/07/2019. Today, pt presents with no overt s/s of aspiration at bedside with presentations of currently prescribed diet (dys 1, honey thick liquids via teaspoon), and no oral residue or right pocketing was seen.  Mild anterior labial spillage on the right noted with both purees and thickened liquids due to decreased labial seal and right inattention.  Pt was noted to be impulsive with both bolus size and rate when self feeding. Recommend that pt remain on puree diet and honey thick liquids via teaspoon with trials of advanced consistencies with SLP to work towards repeat objective assessment as needed. Prognosis for advancement seems good with ongoing ST interventions for management of safe diet progression.  Safe swallow precautions were posted at [facility]. 1:1 supervision/assistance is recommended at mealtime. Meds should be crushed in puree.   RECEPTIVE AND EXPRESSIVE APHASIA: Pt presents with severe expressive and receptive aphasia.  Pt inconsistently follows 1 step verbal commands. Accuracy improves with visual/ demonstration cues. Pt responses to yes/no questions are not reliable. Pt is currently unable to vocalize any sounds other than /ta/ and /da/, although he did verbalize "yeah" once during this evaluation. Pt is  unable to name basic, familiar objects or repeat at the syllable or word level despite max to total assist multimodal cues.     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 Pt is not stimulable for any of the 9 articulatory positions from the apraxia self cueing program, even given max multimodal cues.  Given the abovementioned deficits, pt would benefit from skilled ST while inpatient in order to maximize functional communication and reduce burden of care prior to discharge.  Anticipate that pt will need 24/7 supervision at discharge in addition to [place] follow up at next level of care.           Skilled Therapeutic Interventions          Cognitive-linguistic and bedside swallow evaluations completed with results and recommendations reviewed with the pt.    SLP Assessment  Patient will need skilled Speech Language Pathology Services during CIR admission    Recommendations  SLP Diet Recommendations: Honey;Dysphagia 1 (Puree) Liquid Administration via: Spoon Medication Administration: Crushed with puree Supervision: Full supervision/cueing for compensatory strategies;Patient able to self feed;Staff to assist with self feeding Compensations: Slow rate;Small sips/bites;Monitor for anterior loss;Lingual sweep for clearance of pocketing;Multiple dry swallows after each bite/sip;Clear throat intermittently;Minimize environmental distractions Postural Changes and/or Swallow Maneuvers: Seated upright 90 degrees;Upright 30-60 min after meal Oral Care Recommendations: Oral care BID Patient destination: Home Follow up Recommendations: Home Health SLP Equipment Recommended: To be determined    SLP Frequency 3 to 5 out of 7 days   SLP Duration  SLP Intensity  SLP Treatment/Interventions 3-4 weeks  Minumum of 1-2 x/day, 30 to 90 minutes  Environmental controls;Speech/Language facilitation;Cueing hierarchy;Functional tasks;Therapeutic Activities;Oral motor exercises;Dysphagia/aspiration precaution  training;Patient/family education    Pain Pain Assessment Pain Scale: Faces Faces Pain Scale: No hurt  Prior Functioning Cognitive/Linguistic Baseline: Information not available Type of Home: House Available Help at Discharge: Family Education: unknown Vocation: Retired  Industrial/product designer Term Goals: Week 1: SLP Short Term Goal 1 (Week 1): Pt will tolerate current diet (puree/honey thick liquid via tsp) without overt s/s aspiration given ModA verbal and visual cues for use of safe swallow strategies. SLP Short Term Goal 2 (Week 1): Pt will answer basic yes/no questions utilizing multimodal communication with 75% accuracy given MaxA multimodal cues SLP Short Term Goal 3 (Week 1): Pt will follow basic one-step verbal directions with 75% accuracy given MaxA. SLP Short Term Goal 4 (Week 1): Pt will communicate basic wants/needs at the word level with MaxA verbal and visual cues. SLP Short Term Goal 5 (Week 1): Pt will produce 3-5 vowel sounds and/or CV syllables with 50% accuracy given maxA verbal and visual cues.  Refer to Care Plan for Long Term Goals  Recommendations for other services: None   Discharge Criteria: Patient will be discharged from SLP if patient refuses treatment 3 consecutive times without medical reason, if treatment goals not met, if there is a change in medical status, if patient makes no progress towards goals or if patient is discharged from hospital.  The above assessment, treatment plan, treatment alternatives and goals were discussed and mutually agreed upon: No family available/patient unable   Celia B. Quentin Ore, [facility], CCC-SLP Speech Language Pathologist  Shonna Chock 04/11/2019, 2:16 PM

## 2019-04-11 NOTE — Progress Notes (Signed)
Social Work  Social Work Assessment and Plan  Patient Details  Name: Gregory [name] MRN: [id number] Date of Birth: [birth year]  Today's Date: 04/11/2019  Problem List:  Patient Active Problem List   Diagnosis Date Noted  . 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 Cerebral infarction due to embolism of left middle cerebral artery (HCC)   . Stroke (cerebrum) (HCC) 04/05/2019  . Demand ischemia (HCC) 04/05/2019  . 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 Pre-diabetes 09/05/2018  . CKD (chronic kidney disease) stage 3, GFR 30-59 ml/min (HCC) 09/05/2018  . Bilateral carotid artery stenosis 09/05/2018  . Essential hypertension 09/05/2018  . TIA (transient ischemic attack) 09/04/2018   Past Medical History:  Past Medical History:  Diagnosis Date  . AKI (acute kidney injury) (HCC)   . CKD (chronic kidney disease) stage 2, GFR 60-89 ml/min   . CVA (cerebral vascular accident) (HCC)   . Hyperlipidemia   . Hypertension   . PVD (peripheral vascular disease) (HCC)    Past Surgical History:  Past Surgical History:  Procedure Laterality Date  . AORTIC ARCH ANGIOGRAPHY N/A 09/09/2018   Procedure: AORTIC ARCH ANGIOGRAPHY;  Surgeon: Sherren KernsFields, Charles E, MD;  [facility];  Service: Cardiovascular;  Laterality: N/A;  . CAROTID ANGIOGRAPHY Bilateral 09/09/2018   Procedure: CAROTID ANGIOGRAPHY;  Surgeon: Sherren KernsFields, Charles E, MD;  Location: [facility];  Service: Cardiovascular;  Laterality: Bilateral;  . LOOP RECORDER INSERTION N/A 04/07/2019   Procedure: LOOP RECORDER INSERTION;  Surgeon: Marinus Mawaylor, Gregg W, MD;  [facility];  Service: Cardiovascular;  Laterality: N/A;   Social History:  has no history on file for tobacco, alcohol, and drug.  Family / Support Systems Marital Status: Divorced Patient Roles: Other (Comment), Parent(ex-spouse lives with him) Spouse/Significant Other: Pattricia Bossnnie [phone]-home Children: Coury-son [phone] Three  other children who are local also Other Supports: Friends  Anticipated Caregiver: Pattricia BossAnnie and Dorinda Hillonald  Ability/Limitations of Caregiver: Pattricia Bossnnie reports they will do what is needed. Between all of them Caregiver Availability: 24/7 Family Dynamics: Pattricia Bossnnie reports she loves him but didn't want to be committed to him anymore. They have four children who are involved and supportive. Between all of them hopefully they can do what is needed for pt  Social History Preferred language: [place] Religion: [employer] Cultural Background: No issues Education: [employer] Read: Yes Write: Yes Employment Status: Retired Date Retired/Disabled/Unemployed: [employer] Legal History/Current Legal Issues: No issues Guardian/Conservator: None-according to MD pt is not fully capable of making his own decisions while here. Since he and his wife are divorced, will look toward his children for any decisions while here. Dorinda HillDonald is the oldest and the one who is in charge now   Abuse/Neglect Abuse/Neglect Assessment Can Be Completed: Yes Physical Abuse: Denies Verbal Abuse: Denies Sexual Abuse: Denies Exploitation of patient/patient's resources: Denies Self-Neglect: Denies  Emotional Status Pt's affect, behavior and adjustment status: Pt can nod yes/no and wants to do well here. He was independent before this and hopeful to be again. He was in the hospital in 08/2018 after he had a TIA, but recovered from this. Anie reports he has always been hard headed and stubborn and has done what he wanted. This is partly the reason she divorced him too may baby Mommas Recent Psychosocial Issues: [facility] health issues[facility] [place] Psychiatric History: No history deferred depression screening due to pt's inability to speak at this time. He is good with yes/no but will wait and  see his progress beofre having neuro-psych see him here. Substance Abuse History: No issues  Patient / Family  Perceptions, Expectations & Goals Pt/Family understanding of illness & functional limitations: Pt seems to realize the reason he is in the hospital and [name] can ecplain his stroke and deficits. Their son-Nycholas talks with the MD and relays the information back to her. They are hopeful he will do well here and recover from this stroke. Premorbid pt/family roles/activities: Father, friend, veteran, retiree, etc Anticipated changes in roles/activities/participation: resume Pt/family expectations/goals: Pt nods when talks about starting rehab today. [name] states: " I hope he does well there, he was independent before and only needed some supervision."  [employer]: Other (Comment)([facility] [place] [facility] services) Premorbid Home Care/DME Agencies: Other (Comment)(has multiple pieces of DME-scooter and rw, bsc, etc) Transportation available at discharge: Family and [name] pt did not drive prior to admission Resource referrals recommended: Neuropsychology  Discharge Planning Living Arrangements: Spouse/significant other Support Systems: Spouse/significant other, Children, Other relatives, Friends/neighbors, Church/faith community Type of Residence: Private residence Insurance Resources: Media planner (specify)([employer] Medicare) Financial Resources: Social Security Financial Screen Referred: No Living Expenses: Own Money Management: Spouse Does the patient have any problems obtaining your medications?: No Home Management: [name] does the home management Patient/Family Preliminary Plans: Return home with [name] who has not committed to providing physical care to him. They do have four children who are local and involved. Will need to see if [name] is not going to do physical care then will see if a child will be there to provide this. Sw Barriers to Discharge: Decreased caregiver support, Home environment access/layout Sw Barriers to Discharge Comments:  Annie-ex-wife may not want to provide physical care did not committ to it on assessment. May need ramp into home Social Work Anticipated Follow Up Needs: HH/OP, Support Group  Clinical Impression Pleasant gentleman who lives with his ex-wife and both still supportive of one another. They have four grown children who are local and involved. Pt will likely need physical assist at discharge will need to see who will be the responsible one to provide this with the dynamics the way they are. Will await therapy evaluations and work on safe discharge plan for pt.  Lucy Chris 04/11/2019, 11:55 AM

## 2019-04-11 NOTE — Progress Notes (Signed)
[facility] PHYSICAL MEDICINE & REHABILITATION PROGRESS NOTE   Subjective/Complaints:  Aphasic unable to state name but can nod Y/N  ROS- unable due to aphasia  Objective:   No results found. Recent Labs    04/10/19 0518 04/11/19 0610  WBC 10.9* 10.2  HGB 10.0* 9.6*  HCT 33.5* 32.5*  PLT 183 169   Recent Labs    04/10/19 0518 04/11/19 0610  NA 142 139  K 4.5 4.8  CL 97* 96*  CO2 30 28  GLUCOSE 120* 104*  BUN 104* 100*  CREATININE 5.78* 5.13*  CALCIUM 8.2* 8.1*    Intake/Output Summary (Last 24 hours) at 04/11/2019 0851 Last data filed at 04/11/2019 0600 Gross per 24 hour  Intake 900 ml  Output 550 ml  Net 350 ml     Physical Exam: Vital Signs Blood pressure 136/67, pulse 77, temperature 98 F (36.7 C), temperature source Oral, resp. rate 18, height 5\' 4"  (1.626 m), weight 85.6 kg, SpO2 99 %.   General: No acute distress Mood and affect are appropriate Heart: irreg rate and rhythm no rubs murmurs or extra sounds Lungs: Clear to auscultation, breathing unlabored, no rales or wheezes Abdomen: Positive bowel sounds, soft nontender to palpation, mildly distended Extremities: No clubbing, cyanosis, or edema Skin: No evidence of breakdown, no evidence of rash Neurologic: Cranial nerves II through XII intact, motor strength is 5/5 in left  deltoid, bicep, tricep, grip, hip flexor, knee extensors, ankle dorsiflexor and plantar flexor Sensory exam normal sensation to light touch and proprioception in bilateral upper and lower extremities 2- RIght biceps and triceps, 0/5 delt and grip 3- R HF, KE, ADF Cerebellar exam normal finger to nose to finger as well as heel to shin in bilateral upper and lower extremities Musculoskeletal: Full range of motion in all 4 extremities. No joint swelling   Assessment/Plan: 1. Functional deficits secondary to Left insular and posterior frontal infarct which require 3+ hours per day of interdisciplinary therapy in a comprehensive  inpatient rehab setting.  Physiatrist is providing close team supervision and 24 hour management of active medical problems listed below.  Physiatrist and rehab team continue to assess barriers to discharge/monitor patient progress toward functional and medical goals  Care Tool:  Bathing              Bathing assist       Upper Body Dressing/Undressing Upper body dressing   What is the patient wearing?: Hospital gown only    Upper body assist Assist Level: Moderate Assistance - Patient 50 - 74%    Lower Body Dressing/Undressing Lower body dressing      What is the patient wearing?: Incontinence brief     Lower body assist Assist for lower body dressing: Total Assistance - Patient < 25%     Toileting Toileting    Toileting assist Assist for toileting: Total Assistance - Patient < 25%     Transfers Chair/bed transfer  Transfers assist  Chair/bed transfer activity did not occur: Safety/medical concerns        Locomotion Ambulation   Ambulation assist              Walk 10 feet activity   Assist           Walk 50 feet activity   Assist           Walk 150 feet activity   Assist           Walk 10 feet on uneven surface  activity  Assist           Wheelchair     Assist               Wheelchair 50 feet with 2 turns activity    Assist            Wheelchair 150 feet activity     Assist          Medical Problem List and Plan: 1.Right hemiparesis and aphasia/dysphagiasecondary to 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 Antithrombotics: -DVT/anticoagulation:SCDs -antiplatelet therapy: aspirin 325 mg daily and add clopidigrel- monitor for hematuria 3. Pain Management:Tylenol as needed 4. Mood:Provide emotional support -antipsychotic agents: N/A 5.  Neuropsych: This patientis notcapable of making decisions on hisown behalf. 6. Skin/Wound Care:Routine skin checks -local care to loop recorder site as needed 7. Fluids/Electrolytes/Nutrition:Routine in and out's with follow-up chemistriestomorrow.  -encourage PO 8. Dysphagia. Dysphagia #1Honeythick liquids. Monitor hydration. Speech therapy follow-up 9. Multifocal atrial tachycardia/hypertension. Lopressor 25 mg 3 times a day, Procardia 30 mg daily. Follow-up cardiology services 10. 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 Follow-up urology as well as nephrology services.FOLEY TUBE REMAINS IN PLACE UNTIL FOLLOW UPASOUT PATIENT WITH UROLOGY 11. Hyperlipidemia. Lipitor 12. Morbid obesity/OSA. Continue CPAP    LOS: 1 days A FACE TO FACE EVALUATION WAS PERFORMED  Erick Colacendrew E Kirsteins 04/11/2019, 8:51 AM

## 2019-04-11 NOTE — Progress Notes (Signed)
Inpatient Rehabilitation  Patient information reviewed and entered into eRehab system by Sky Borboa M. Bama Hanselman, M.A., CCC/SLP, PPS Coordinator.  Information including medical coding, functional ability and quality indicators will be reviewed and updated through discharge.    

## 2019-04-11 NOTE — Progress Notes (Signed)
Patient refusing CPAP at this time

## 2019-04-11 NOTE — Evaluation (Signed)
Occupational Therapy Assessment and Plan  Patient Details  Name: Tory Septer MRN: [id number] Date of Birth: September 25, 1951  OT Diagnosis: abnormal posture, apraxia, cognitive deficits, disturbance of vision, hemiplegia affecting dominant side and muscle weakness (generalized) Rehab Potential: Rehab Potential (ACUTE ONLY): Fair ELOS: 3-4 weeks   Today's Date: 04/11/2019 OT Individual Time: 8115-7262 OT Individual Time Calculation (min): 75 min     Problem List:  Patient Active Problem List   Diagnosis Date Noted  . Embolic cerebral infarction ([place]) 04/10/2019  . Cerebral infarction due to embolism of left middle cerebral artery ([place])   . Stroke (cerebrum) ([place]) 04/05/2019  . Demand ischemia ([place]) 04/05/2019  . Multifocal atrial tachycardia ([place]) 04/05/2019  . Pre-diabetes 09/05/2018  . CKD (chronic kidney disease) stage 3, GFR 30-59 ml/min (HCC) 09/05/2018  . Bilateral carotid artery stenosis 09/05/2018  . Essential hypertension 09/05/2018  . TIA (transient ischemic attack) 09/04/2018    Past Medical History:  Past Medical History:  Diagnosis Date  . AKI (acute kidney injury) ([place])   . CKD (chronic kidney disease) stage 2, GFR 60-89 ml/min   . CVA (cerebral vascular accident) ([place])   . Hyperlipidemia   . Hypertension   . PVD (peripheral vascular disease) ([place])    Past Surgical History:  Past Surgical History:  Procedure Laterality Date  . AORTIC ARCH ANGIOGRAPHY N/A 09/09/2018   Procedure: AORTIC ARCH ANGIOGRAPHY;  Surgeon: Elam Dutch, MD;  Location: [facility];  Service: Cardiovascular;  Laterality: N/A;  . CAROTID ANGIOGRAPHY Bilateral 09/09/2018   Procedure: CAROTID ANGIOGRAPHY;  Surgeon: Elam Dutch, MD;  Location: [facility];  Service: Cardiovascular;  Laterality: Bilateral;  . LOOP RECORDER INSERTION N/A 04/07/2019   Procedure: LOOP RECORDER INSERTION;  Surgeon: Evans Lance, MD;  Location: [facility];  Service: Cardiovascular;   Laterality: N/A;    Assessment & Plan Clinical Impression: Patient is a 68 y.o. right-handed male with history of chronic kidney disease stage III with creatinine baseline 1.6-1.7, hypertension maintained on HCTZ and lisinopril, hyperlipidemia. Per chart review patient lives with spouse reportedly independent prior to admission. Presented 04/05/2019 with acute onset of right sided weakness and slurred speech. Cranial CT scan showed possible early gray- white differentiation loss in the insula on the left. Chronic small vessel ischemic changes. MRI/MRA showed a 3.5 centimeter region of acute infarction affecting the left insula and posterior frontal region. No evidence of gross hemorrhage. MRA negative. Patient did not receive TPA. Noted findings of hyperkalemia 6.8 as well as elevated creatinine 10.4 rate from baseline 1.70, troponin 3.29. EKG with ST changes. Carotid Dopplers 40-59% ICA stenosis bilaterally.Echocardiogram with ejection fraction of 60% and normal systolic function. Cardiology service is consulted for suspect non-STEMI medical management recommended. Patient did undergo placement of loop recorder 04/07/2019 per Dr. Lovena Le. Renal ultrasound showed bilateral hydronephrosis with nephrology services consulted as well as urology. A Foley catheter tube was placed and would remain in place until follow-up outpatient urology.renal function continues to improve 5.78 creatinine 04/10/2019. Maintained on aspirin for CVA prophylaxis. Dysphagia #1 honey thick liquid diet. Therapy evaluations completed with recommendations of physical medicine rehabilitation consult. Patient was admitted for a comprehensive rehabilitation program. Patient transferred to CIR on 04/10/2019 .    Patient currently requires max +2 with basic self-care skills secondary to muscle weakness, decreased cardiorespiratoy endurance and decreased oxygen support, motor apraxia, decreased coordination and decreased motor planning, field  cut, decreased attention to right and ideational apraxia, decreased awareness, decreased problem  solving and decreased safety awareness and decreased sitting balance, decreased standing balance, decreased postural control, hemiplegia and decreased balance strategies.  Prior to hospitalization, patient could complete ADLs with independent .  Patient will benefit from skilled intervention to decrease level of assist with basic self-care skills prior to discharge home with care partner.  Anticipate patient will require 24 hour supervision and minimal physical assistance and follow up home health.  OT - End of Session Activity Tolerance: Tolerates 30+ min activity with multiple rests Endurance Deficit: Yes Endurance Deficit Description: fatigues easily OT Assessment Rehab Potential (ACUTE ONLY): Fair OT Barriers to Discharge: Incontinence;New oxygen;Other (comments) OT Barriers to Discharge Comments: foley catheter, unsure of family support OT Patient demonstrates impairments in the following area(s): Balance;Cognition;Endurance;Motor;Pain;Perception;Safety;Sensory;Skin Integrity;Vision OT Basic ADL's Functional Problem(s): Grooming;Bathing;Dressing;Toileting OT Transfers Functional Problem(s): Toilet;Tub/Shower OT Additional Impairment(s): Fuctional Use of Upper Extremity OT Plan OT Intensity: Minimum of 1-2 x/day, 45 to 90 minutes OT Frequency: 5 out of 7 days OT Duration/Estimated Length of Stay: 3-4 weeks OT Treatment/Interventions: Balance/vestibular training;Cognitive remediation/compensation;Discharge planning;DME/adaptive equipment instruction;Functional electrical stimulation;Neuromuscular re-education;Functional mobility training;Pain management;Patient/family education;Psychosocial support;Self Care/advanced ADL retraining;Skin care/wound managment;Splinting/orthotics;Therapeutic Activities;Therapeutic Exercise;UE/LE Strength taining/ROM;UE/LE Coordination activities;Visual/perceptual  remediation/compensation;Wheelchair propulsion/positioning OT Basic Self-Care Anticipated Outcome(s): Min assist OT Toileting Anticipated Outcome(s): Min assist OT Bathroom Transfers Anticipated Outcome(s): Min assist OT Recommendation Patient destination: Home Follow Up Recommendations: Home health OT;24 hour supervision/assistance Equipment Recommended: 3 in 1 bedside comode;Tub/shower bench   Skilled Therapeutic Intervention OT eval completed with discussion of rehab process, OT purpose, POC, and goals.  ADL assessment completed with bathing and dressing at sink side.  Pt demonstrated inconsistent ability to follow one step commands, did benefit from demonstration cues and setup of tasks.  Pt able to wash upper body with increased time when handed wash cloth, able to lift RUE slightly to wash underarm however did not attempt to wash remainder of RUE or attempt LUE.  Pt engaged in LB dressing with attempts to reach towards LLE to pull pants up, however required total assist.  Pt shook head "no" when asked if he could attempt standing to pull up pants.  Pt attempting to verbalize something to which therapist asked if he needed to toilet.  Pt nodded "yes", therefore transferred to [facility] with [facility] assist of 1 person.  Pt required increased time to complete BM.  Once completed able to stand from [facility] in to [facility] with max assist, pt demonstrating increased pushing with LUE/LLE requiring increased physical assistance - ultimately requiring +2 due to pushing and fatigue.  +2 to complete hygiene and clothing management and return to bed.  Pt left semi-reclined in bed with all needs in reach.  OT Evaluation Precautions/Restrictions  Precautions Precautions: Fall General   Vital Signs Therapy Vitals Temp: 98 F (36.7 C) Temp Source: Oral Pulse Rate: 96 Resp: 16 BP: (!) 130/103 Patient Position (if appropriate): Sitting Oxygen Therapy SpO2: (!) 87 % O2 Device: Nasal Cannula O2 Flow Rate  (L/min): 2 L/min Pain Pain Assessment Pain Scale: Faces Faces Pain Scale: No hurt Home Living/Prior Functioning Home Living Family/patient expects to be discharged to:: Private residence Living Arrangements: Spouse/significant other Available Help at Discharge: Family Type of Home: House Home Access: Stairs to enter Technical brewer of Steps: 2 Entrance Stairs-Rails: (pt unable to report) Home Layout: One level Bathroom Shower/Tub: Chiropodist: Standard Additional Comments: pt with great difficulty speaking but able to nod "yes" and shake head "no" in response to some questions (inconsistent) - per chart review  pt lives with his spouse and was previously independent  IADL History Education: unknown Prior Function Level of Independence: Independent with homemaking with ambulation, Independent with gait, Independent with basic ADLs  Able to Take Stairs?: Yes Driving: Yes Vocation: Retired Comments: difficult to assess PLOF secondary to communication impairments, will follow up with family Vision Baseline Vision/History: (difficult to assess due to aphasia) Additional Comments: Pt unable to participate in formal visual assessment, but does appear to have Rt visual field deficits Cognition Overall Cognitive Status: Difficult to assess(No information regarding baseline level of function) Arousal/Alertness: Awake/alert Comments: difficult to assess due to aphasia Sensation Sensation Light Touch: Impaired Detail Central sensation comments: sensation difficult to formally assess, R LE/UE sensation appears impaired compared to L side Additional Comments: sensation difficult to formally assess, R LE/UE sensation appears impaired compared to L side Coordination Gross Motor Movements are Fluid and Coordinated: No Fine Motor Movements are Fluid and Coordinated: No Coordination and Movement Description: impaired secondary to R hypertonia and hemiparesis Motor   Motor Motor: Hemiplegia;Abnormal tone Motor - Skilled Clinical Observations: R hypertonia and hemiparesis Extremity/Trunk Assessment RUE Assessment RUE Assessment: Exceptions to [facility] Passive Range of Motion (PROM) Comments: shoulder flexion to 150*, elbow flexion WNL extension to 10* from neutral Active Range of Motion (AROM) Comments: shoulder abduction to 20* to elicit flexion, no active elbow or wrist movement General Strength Comments: 1/5 at shoulder, 0/5 distal LUE Assessment LUE Assessment: Within Functional Limits     Refer to Care Plan for Long Term Goals  Recommendations for other services: None    Discharge Criteria: Patient will be discharged from OT if patient refuses treatment 3 consecutive times without medical reason, if treatment goals not met, if there is a change in medical status, if patient makes no progress towards goals or if patient is discharged from hospital.  The above assessment, treatment plan, treatment alternatives and goals were discussed and mutually agreed upon: No family available/patient unable  Ibrahem Volkman, [facility] 04/11/2019, 3:20 PM

## 2019-04-12 ENCOUNTER — Inpatient Hospital Stay (HOSPITAL_COMMUNITY): Payer: Medicare HMO

## 2019-04-12 ENCOUNTER — Telehealth: Payer: Self-pay | Admitting: *Deleted

## 2019-04-12 ENCOUNTER — Inpatient Hospital Stay (HOSPITAL_COMMUNITY): Payer: Medicare HMO | Admitting: Occupational Therapy

## 2019-04-12 ENCOUNTER — Encounter (HOSPITAL_COMMUNITY): Payer: Self-pay | Admitting: *Deleted

## 2019-04-12 MED ORDER — METHYLPHENIDATE HCL 5 MG PO TABS
5.0000 mg | ORAL_TABLET | Freq: Two times a day (BID) | ORAL | Status: DC
  Administered 2019-04-12 – 2019-04-14 (×4): 5 mg via ORAL
  Filled 2019-04-12 (×4): qty 1

## 2019-04-12 MED ORDER — METOPROLOL TARTRATE 25 MG PO TABS
25.0000 mg | ORAL_TABLET | Freq: Two times a day (BID) | ORAL | Status: DC
  Administered 2019-04-12 – 2019-04-14 (×4): 25 mg via ORAL
  Filled 2019-04-12 (×4): qty 1

## 2019-04-12 NOTE — Telephone Encounter (Signed)
Manual transmission received. Per LINQ, AF burden is 92.1%. Some ECGs suggest MAT as noted during hospitalization. See episode list and available ECGs below.  Routed to Dr. Ladona Ridgel for review of "AF" episodes and to confirm if patient should still be taking metoprolol tartrate 25mg  TID as per discharge summary.

## 2019-04-12 NOTE — Telephone Encounter (Signed)
Dr. Ladona Ridgel reviewed EKG's.  Per Dr. Ladona Ridgel Pt should resume his previous dose of metoprolol.  Dr. Ladona Ridgel to send message to Pt's current hospital staff.

## 2019-04-12 NOTE — Progress Notes (Signed)
Occupational Therapy Session Note  Patient Details  Name: Gregory [name] MRN: [id number] Date of Birth: September 05, 1951  Today's Date: 04/12/2019 OT Individual Time: 3785-8850 OT Individual Time Calculation (min): 64 min    Short Term Goals: Week 1:  OT Short Term Goal 1 (Week 1): Pt will complete LB dressing with max assist of one caregiver OT Short Term Goal 2 (Week 1): Pt will complete bathing with mod assist to demonstrate increased initiation and sequencing OT Short Term Goal 3 (Week 1): Pt will complete toilet transfer with max assist of one caregiver OT Short Term Goal 4 (Week 1): Pt will complete UB dressing with mod assist  Skilled Therapeutic Interventions/Progress Updates:    Pt completed bathing and dressing sit to stand from he EOB.  Max assist for supine to sit EOB on the left side of the bed.  Supervision for static sitting balance with LUE support on the bed rail.  He needed mod hand over hand for initiation of squeezing out washcloth over the wash pan with the LUE as well as for initiation to wash his face. He was then able to complete the task.  He initially just picked up the washcloth and let it drip on his gown and the floor and then the next time he squeezed it out over the bedside tray but did not do this over the wash pan.  Max assist for hand over hand using the RUE for washing the LUE and underarm area.  Total assist for sit to stand with use of the [facility] for support during LB selfcare.  He demonstrated flexed trunk in standing with four attempts to complete washing peri area and pulling brief over his hips.  Max assist for stand pivot transfer to the wheelchair to complete session.  Pt left with call button in lap, phone in reach, RUE supported on half lap tray, and safety alarm belt in place.  Nursing made aware that safety belt does not have a cord to access the call system if activated.         Therapy Documentation Precautions:  Precautions Precautions:  Fall Precaution Comments: global aphasia, right hemiparesis Restrictions Weight Bearing Restrictions: No  Pain: Pain Assessment Pain Scale: Faces Pain Score: 0-No pain Faces Pain Scale: Hurts whole lot Pain Location: Knee Pain Orientation: Right Pain Descriptors / Indicators: Grimacing;Guarding Pain Onset: With Activity Pain Intervention(s): Repositioned;Rest ADL: See Care Tool Section for some details of ADL  Therapy/Group: Individual Therapy  Jun Osment OTR/L 04/12/2019, 4:19 PM

## 2019-04-12 NOTE — Progress Notes (Signed)
Patient ID: Gregory [name], male   DOB: 1951-06-22, 68 y.o.   MRN: [id number]  EP Attending  Notified that patient is having NSR with frequent PAC's and MAT. I have prescribed metoprolol 25 mg twice daily.  [name].D.

## 2019-04-12 NOTE — Progress Notes (Signed)
[facility] PHYSICAL MEDICINE & REHABILITATION PROGRESS NOTE   Subjective/Complaints: Remains aphasic  ROS- unable due to aphasia  Objective:   No results found. Recent Labs    04/10/19 0518 04/11/19 0610  WBC 10.9* 10.2  HGB 10.0* 9.6*  HCT 33.5* 32.5*  PLT 183 169   Recent Labs    04/10/19 0518 04/11/19 0610  NA 142 139  K 4.5 4.8  CL 97* 96*  CO2 30 28  GLUCOSE 120* 104*  BUN 104* 100*  CREATININE 5.78* 5.13*  CALCIUM 8.2* 8.1*    Intake/Output Summary (Last 24 hours) at 04/12/2019 0906 Last data filed at 04/12/2019 0800 Gross per 24 hour  Intake 1103.63 ml  Output 1100 ml  Net 3.63 ml     Physical Exam: Vital Signs Blood pressure 120/61, pulse 60, temperature 98.1 F (36.7 C), temperature source Oral, resp. rate 15, height '5\' 4"'$  (1.626 m), weight 86.2 kg, SpO2 100 %.   General: No acute distress Mood and affect are appropriate Heart: irreg rate and rhythm no rubs murmurs or extra sounds Lungs: Clear to auscultation, breathing unlabored, no rales or wheezes Abdomen: Positive bowel sounds, soft nontender to palpation, mildly distended Extremities: No clubbing, cyanosis, or edema Skin: No evidence of breakdown, no evidence of rash Neurologic: Cranial nerves II through XII intact, motor strength is 5/5 in left  deltoid, bicep, tricep, grip, hip flexor, knee extensors, ankle dorsiflexor and plantar flexor Sensory exam normal sensation to light touch and proprioception in bilateral upper and lower extremities 2- RIght biceps and triceps, 0/5 delt and grip 3- R HF, KE, ADF Cerebellar exam normal finger to nose to finger as well as heel to shin in bilateral upper and lower extremities Musculoskeletal: Full range of motion in all 4 extremities. No joint swelling   Assessment/Plan: 1. Functional deficits secondary to Left insular and posterior frontal infarct which require 3+ hours per day of interdisciplinary therapy in a comprehensive inpatient rehab  setting.  Physiatrist is providing close team supervision and 24 hour management of active medical problems listed below.  Physiatrist and rehab team continue to assess barriers to discharge/monitor patient progress toward functional and medical goals  Care Tool:  Bathing    Body parts bathed by patient: Chest, Abdomen, Face   Body parts bathed by helper: Right arm, Left arm, Front perineal area, Buttocks, Right upper leg, Left upper leg, Right lower leg, Left lower leg     Bathing assist Assist Level: 2 Helpers     Upper Body Dressing/Undressing Upper body dressing   What is the patient wearing?: Hospital gown only    Upper body assist Assist Level: Total Assistance - Patient < 25%    Lower Body Dressing/Undressing Lower body dressing      What is the patient wearing?: Incontinence brief     Lower body assist Assist for lower body dressing: 2 Helpers     Toileting Toileting    Toileting assist Assist for toileting: 2 Helpers     Transfers Chair/bed transfer  Transfers assist  Chair/bed transfer activity did not occur: Safety/medical concerns  Chair/bed transfer assist level: 2 Helpers     Locomotion Ambulation   Ambulation assist   Ambulation activity did not occur: Safety/medical concerns          Walk 10 feet activity   Assist  Walk 10 feet activity did not occur: Safety/medical concerns        Walk 50 feet activity   Assist Walk 50 feet with 2  turns activity did not occur: Safety/medical concerns         Walk 150 feet activity   Assist Walk 150 feet activity did not occur: Safety/medical concerns         Walk 10 feet on uneven surface  activity   Assist Walk 10 feet on uneven surfaces activity did not occur: Safety/medical concerns         Wheelchair     Assist Will patient use wheelchair at discharge?: Yes Type of Wheelchair: Manual    Wheelchair assist level: Dependent - Patient 0% Max wheelchair  distance: 150 ft    Wheelchair 50 feet with 2 turns activity    Assist        Assist Level: Dependent - Patient 0%   Wheelchair 150 feet activity     Assist     Assist Level: Dependent - Patient 0%    Medical Problem List and Plan: 1.Right hemiparesis and aphasia/dysphagiasecondary to 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 Formulized individual treatment plan based on medical history, underlying problem and comorbidities. 2. Antithrombotics: -DVT/anticoagulation:SCDs -antiplatelet therapy: aspirin 325 mg daily and add clopidigrel-  hematuria 3. Pain Management:Tylenol as needed 4. Mood:Provide emotional support -antipsychotic agents: N/A 5. Neuropsych: This patientis notcapable of making decisions on hisown behalf. 6. Skin/Wound Care:Routine skin checks -local care to loop recorder site as needed 7. Fluids/Electrolytes/Nutrition:Routine in and out's with follow-up chemistriestomorrow.  -encourage PO 8. Dysphagia. Dysphagia #1Honeythick liquids. Monitor hydration. Speech therapy follow-up 9. Multifocal atrial tachycardia/hypertension. Lopressor 25 mg 3 times a day, Procardia 30 mg daily. Follow-up cardiology services 10. 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 Follow-up urology as well as nephrology services.FOLEY TUBE REMAINS IN PLACE UNTIL FOLLOW UPASOUT PATIENT WITH UROLOGY BUN /Creat slowly trending down 11. Hyperlipidemia. Lipitor 12. Morbid obesity/OSA. Continue CPAP    LOS: 2 days A FACE TO FACE EVALUATION WAS PERFORMED  Charlett Blake 04/12/2019, 9:06 AM

## 2019-04-12 NOTE — IPOC Note (Signed)
Overall Plan of Care [facility](IPOC) Patient Details Name: Caren HazyDonald Lee Sellers MRN: [id number] DOB: [birth year]  Admitting Diagnosis: <principal problem not specified>  Hospital Problems: Active Problems:   Embolic cerebral infarction [facility](HCC)     Functional Problem List: Nursing Behavior, Bladder, Bowel, Edema, Endurance, Medication Management, Motor, Nutrition, Safety, Sensory, Skin Integrity  PT Balance, Behavior, Edema, Endurance, Motor, Pain, Perception, Safety, Sensory, Skin Integrity  OT Balance, Cognition, Endurance, Motor, Pain, Perception, Safety, Sensory, Skin Integrity, Vision  SLP Linguistic, Safety, Cognition  TR         Basic ADL's: OT Grooming, Bathing, Dressing, Toileting     Advanced  ADL's: OT       Transfers: PT Bed Mobility, Bed to Chair, Car, State Street CorporationFurniture, Civil Service fast streamerloor  OT Toilet, Research scientist (life sciences)Tub/Shower     Locomotion: PT Ambulation, Psychologist, prison and probation servicesWheelchair Mobility, Stairs     Additional Impairments: OT Fuctional Use of Upper Extremity  SLP Swallowing, Communication, Social Cognition comprehension, expression Social Interaction, Awareness, Attention  TR      Anticipated Outcomes Item Anticipated Outcome  Self Feeding    Swallowing  intermittent supervision at mealtimes, tolerance of mechanical soft solids and nectar thick liquids.   Basic self-care  Min assist  Toileting  Min assist   Bathroom Transfers Min assist  Bowel/Bladder   continent of bowel and bladder  Transfers  min assist  Locomotion  mod assist  Communication  moderate assistance with communication of basic wants and needs  Cognition  moderate assistance with attention to task  Pain  no pain  Safety/Judgment  free from falls this admission   Therapy Plan: PT Intensity: Minimum of 1-2 x/day ,45 to 90 minutes PT Frequency: 5 out of 7 days PT Duration Estimated Length of Stay: 3-4 weeks OT Intensity: Minimum of 1-2 x/day, 45 to 90 minutes OT Frequency: 5 out of 7 days OT Duration/Estimated Length of Stay: 3-4  weeks SLP Intensity: Minumum of 1-2 x/day, 30 to 90 minutes SLP Frequency: 3 to 5 out of 7 days SLP Duration/Estimated Length of Stay: 3-4 weeks   Due to the current state of emergency, patients may not be receiving their 3-hours of Medicare-mandated therapy.   Team Interventions: Nursing Interventions Bowel Management, Patient/Family Education, Skin Care/Wound Management, Dysphagia/Aspiration Precaution Training, Psychosocial Support, Bladder Management, Disease Management/Prevention, Medication Management, Cognitive Remediation/Compensation, Discharge Planning  PT interventions Ambulation/gait training, Disease management/prevention, Pain management, Stair training, Patient/family education, DME/adaptive equipment instruction, Warden/rangerBalance/vestibular training, Therapeutic Activities, Wheelchair propulsion/positioning, Cognitive remediation/compensation, Functional electrical stimulation, Psychosocial support, Therapeutic Exercise, Functional mobility training, Community reintegration, Skin care/wound management, UE/LE Strength taining/ROM, Discharge planning, Neuromuscular re-education, Splinting/orthotics, UE/LE Coordination activities  OT Interventions Balance/vestibular training, Cognitive remediation/compensation, Discharge planning, DME/adaptive equipment instruction, Functional electrical stimulation, Neuromuscular re-education, Functional mobility training, Pain management, Patient/family education, Psychosocial support, Self Care/advanced ADL retraining, Skin care/wound managment, Splinting/orthotics, Therapeutic Activities, Therapeutic Exercise, UE/LE Strength taining/ROM, UE/LE Coordination activities, Visual/perceptual remediation/compensation, Wheelchair propulsion/positioning  SLP Interventions Environmental controls, Speech/Language facilitation, Cueing hierarchy, Functional tasks, Therapeutic Activities, Oral motor exercises, Dysphagia/aspiration precaution training, Patient/family education   TR Interventions    SW/CM Interventions Discharge Planning, Psychosocial Support, Patient/Family Education   Barriers to Discharge MD  Medical stability and Aphasia  Nursing      PT Inaccessible home environment 2 STE  OT Incontinence, New oxygen, Other (comments) foley catheter, unsure of family support  SLP      SW Decreased caregiver support, Home environment access/layout Annie-ex-wife may not want to provide physical care did not committ to it on assessment. May need ramp into home  Team Discharge Planning: Destination: PT-Home ,OT- Home , SLP-Home Projected Follow-up: PT-Home health PT, OT-  Home health OT, 24 hour supervision/assistance, SLP-Home Health SLP Projected Equipment Needs: PT-To be determined, OT- 3 in 1 bedside comode, Tub/shower bench, SLP-To be determined Equipment Details: PT- , OT-  Patient/family involved in discharge planning: PT- Patient unable/family or caregiver not available,  OT-Patient unable/family or caregiver not available, SLP-Patient unable/family or caregive not available  MD ELOS: 21-24d Medical Rehab Prognosis:  Good Assessment:  68 year old right-handed male with history of chronic kidney disease stage III with creatinine baseline 1.6-1.7, hypertension maintained on HCTZ and lisinopril, hyperlipidemia. Per chart review patient lives with spouse reportedly independent prior to admission. Presented 04/05/2019 with acute onset of right sided weakness and slurred speech. Cranial CT scan showed possible early gray- white differentiation loss in the insula on the left. Chronic small vessel ischemic changes. MRI/MRA showed a 3.5 centimeter region of acute infarction affecting the left insula and posterior frontal region. No evidence of gross hemorrhage. MRA negative. Patient did not receive TPA. Noted findings of hyperkalemia 6.8 as well as elevated creatinine 10.4 rate from baseline 1.70, troponin 3.29. EKG with ST changes. Carotid Dopplers 40-59% ICA  stenosis bilaterally.Echocardiogram with ejection fraction of 60% and normal systolic function. Cardiology service is consulted for suspect non-STEMI medical management recommended. Patient did undergo placement of loop recorder 04/07/2019 per Dr. Ladona Ridgel. Renal ultrasound showed bilateral hydronephrosis with nephrology services consulted as well as urology. A Foley catheter tube was placed and would remain in place until follow-up outpatient urology.renal function continues to improve 5.78 creatinine 04/10/2019. Maintained on aspirin for CVA prophylaxis. Dysphagia #1 honey thick liquid diet   Now requiring 24/7 Rehab RN,MD, as well as CIR level PT, OT and SLP.  Treatment team will focus on communication, ADLs and mobility with goals set at min/mod A See Team Conference Notes for weekly updates to the plan of care

## 2019-04-12 NOTE — Progress Notes (Signed)
Patient ID: Gregory [name], male   DOB: 29-Jan-1951, 68 y.o.   MRN: [id number]  [facility] Progress Note   Assessment/ Plan:   1.  Acute kidney injury on chronic kidney disease stage III:  With bladder outlet obstruction/obstructive uropathy - to leave indwelling Foley until seen by urology as outpatient.   - Continue supportive care  - Renal function slowly improving  - It is highly likely that he has had this obstruction for >2 weeks and may have significant residual renal injury - BMP in AM    2.  Hypernatremia: Limited access to free water due to dysphagia precautions.  Resolved.  3.  Hypertension: acceptable   4.  Anemia: Stable; Replete iron stores and stable hemoglobin/hematocrit with resolution of hematuria.   5.  Elevated troponin levels: Trending down last eval  6.  Acute CVA with right-sided weakness/expressive aphasia: s/p speech eval and for rehab   Subjective:   Patient has been more alert.  He's a little short of breath now but was ok before getting up and moving around with staff.  Feeling better.  Review of systems:  Denies chest pain Denies nausea or vomiting  Continues with foley   Objective:   BP 108/65 (BP Location: Left Arm)   Pulse 92   Temp 98.5 F (36.9 C) (Oral)   Resp 18   Ht 5\' 4"  (1.626 m)   Wt 86.2 kg   SpO2 100%   BMI 32.62 kg/m   Intake/Output Summary (Last 24 hours) at 04/12/2019 1621 Last data filed at 04/12/2019 1350 Gross per 24 hour  Intake 1103.63 ml  Output 1675 ml  Net -571.37 ml   Weight change: 0.61 kg  Physical Exam: Gen: elderly male in bed in no acute distress  CVS: RRR; S1, S2 Resp: clear to auscultation bilaterally Abd: Soft, obese, nontender Ext: no edema lower extremities  Aphasia; awake and interactive  Imaging: No results found.  Labs: BMET Recent Labs  Lab 04/06/19 0015  04/06/19 1337 04/06/19 2019 04/07/19 0906 04/08/19 0357 04/09/19 0900 04/10/19 0518 04/11/19 0610  NA 140   < >  143 143 145 147* 142 142 139  K 6.1*   < > 5.4* 5.5* 5.0 4.8 4.3 4.5 4.8  CL 110   < > 105 103 98 96* 96* 97* 96*  CO2 16*   < > 20* 24 32 33* 32 30 28  GLUCOSE 112*   < > 97 109* 112* 103* 154* 120* 104*  BUN 154*   < > 144* 138* 129* 127* 117* 104* 100*  CREATININE 9.56*   < > 8.54* 8.05* 7.33* 7.04* 6.01* 5.78* 5.13*  CALCIUM 8.7*   < > 8.3* 8.2* 7.9* 8.1* 8.1* 8.2* 8.1*  PHOS 6.2*  --   --   --   --   --   --   --   --    < > = values in this interval not displayed.   CBC Recent Labs  Lab 04/07/19 0906 04/08/19 0357 04/10/19 0518 04/11/19 0610  WBC 14.0* 10.7* 10.9* 10.2  NEUTROABS  --   --   --  6.7  HGB 9.4* 9.5* 10.0* 9.6*  HCT 30.4* 30.6* 33.5* 32.5*  MCV 88.6 90.5 93.6 93.9  PLT 164 150 183 169    Medications:    . aspirin  300 mg Rectal Daily   Or  . aspirin  325 mg Oral Daily  . atorvastatin  80 mg Oral q1800  . clopidogrel  75 mg Oral Daily  . methylphenidate  5 mg Oral BID WC   Estanislado EmmsLori C Foster 04/12/2019

## 2019-04-12 NOTE — Progress Notes (Signed)
Physical Therapy Session Note  Patient Details  Name: Gregory [name] MRN: [id number] Date of Birth: 05-07-1951  Today's Date: 04/12/2019 PT Individual Time: 1015-1130 PT Individual Time Calculation (min): 75 min   Short Term Goals: Week 1:  PT Short Term Goal 1 (Week 1): Pt will perform bed<>chair transfer with mod assist PT Short Term Goal 2 (Week 1): Pt will initiate gait training PT Short Term Goal 3 (Week 1): Pt will perform bed mobility with min assist  Skilled Therapeutic Interventions/Progress Updates:    Patient in supine on bedpan.  Assisted to roll with NT for hygiene with max A.  Donned new brief and pt from L side to sit max A and cues for technique.  Patient seated EOB with S for safety.  Used Stedy for sit to stand and pivot to w/c with +2 A.  Patient assisted in w/c to therapy gym.  Performed squat pivot to R with mod A.  Seated EOM for work on L lateral weight shift with reaching for and placing cards on back of mirror.  Then performed reaching down to L to obtain clothes pins to place on box to L on table.  Patient performed sit<>stand x 5 reps initially with chair on L and mod to max A with cues/assist/facilitation for R LE hip/knee extension and trunk extension then with chair in front reaching with A for R UE with both UE's and more difficulty with balance due to leaning to R but able to get some weight bearing in R UE.  Patient pivoted to L to w/c with max A and assisted in w/c to ortho gym.  Sit to stand in standing frame and stood about 1-1.5 minutes with cues for L lateral weight shift and assist for decreased weight on R due to c/o pain R knee throughout standing trials.  Patient assisted in w/c to room and pivot transfer max A to L, but pt not fully on bed so +2 A for laying down, placing feet in bed and scooting up in bed.  Left with call bell in reach and bed alarm activated.   Therapy Documentation Precautions:  Precautions Precautions: Fall Restrictions Weight  Bearing Restrictions: No Pain: Pain Assessment Pain Scale: Faces Pain Score: 0-No pain Faces Pain Scale: Hurts whole lot Pain Location: Knee Pain Orientation: Right Pain Descriptors / Indicators: Grimacing;Guarding Pain Onset: With Activity Pain Intervention(s): Repositioned;Rest    Therapy/Group: Individual Therapy  Elray Mcgregor  [place], PT 04/12/2019, 1:20 PM

## 2019-04-12 NOTE — Patient Care Conference (Signed)
Inpatient RehabilitationTeam Conference and Plan of Care Update Date: 04/12/2019   Time: 11:30 AM    Patient Name: Gregory [name]      Medical Record Number: [id number]  Date of Birth: 08/05/1951 Sex: Male         Room/Bed: 4W15C/4W15C-01 Payor Info: Payor: [employer] MEDICARE / Plan: [employer] MEDICARE HMO / Product Type: *No Product type* /    Admitting Diagnosis: L CVA  Admit Date/Time:  04/10/2019  7:32 PM Admission Comments: No comment available   Primary Diagnosis:  <principal problem not specified> Principal Problem: <principal problem not specified>  Patient Active Problem List   Diagnosis Date Noted  . 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 Cerebral infarction due to embolism of left middle cerebral artery (HCC)   . Stroke (cerebrum) (HCC) 04/05/2019  . Demand ischemia (HCC) 04/05/2019  . 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 Pre-diabetes 09/05/2018  . CKD (chronic kidney disease) stage 3, GFR 30-59 ml/min (HCC) 09/05/2018  . Bilateral carotid artery stenosis 09/05/2018  . Essential hypertension 09/05/2018  . TIA (transient ischemic attack) 09/04/2018    Expected Discharge Date:    Team Members Present: Physician leading conference: Dr. Claudette LawsAndrew Kirsteins Social Worker Present: Dossie DerBecky Jaivyn Gulla, LCSW Nurse Present: Tennis MustLuz Rosero, RN PT Present: Woodfin GanjaEmily Van Shagen, PT OT Present: Perrin MalteseJames McGuire, OT SLP Present: Colin BentonMadison Cratch, SLP PPS Coordinator present : Fae PippinMelissa Bowie     Current Status/Progress Goal Weekly Team Focus  Medical   renal function slowly improving, poor appetite  improve ability to participate in therapy, maintain medical stability , reduce fall risk  ritalin trial for poor level of alertness   Bowel/Bladder   Incontinent of bowel, and currently has foley in place for retention. Pt has order not to remove foley     To manage bowel and assess foley for removal.   Swallow/Nutrition/ Hydration   dys 1 and HTL via TSP Max  Min A  Tolerance of dys  1 and HTL and trials of increase consistencies    ADL's   Mod assist for UB bathing with max assist for UB dressing.  total +2 for LB selfcare tasks and for transfers squat pivot transfers  Min assist  selfcare retraining, transfer training, neuromuscular re-education, balance retraining, therapeutic exercise, pt/family education   Mobility   mod assist bed mobility, max assist transfers and sit<>stands, gait unable  min assist  balance, transfers, standing balance, gait, w/c propulsion   Communication   Total A expressive speech, Max-Mod A receptive language   Min multimodal   wants/needs in response to yes/no and word level, 1 step directions, CV/Vowel sounds   Safety/Cognition/ Behavioral Observations            Pain             Skin   No skin issues            *See Care Plan and progress notes for long and short-term goals.     Barriers to Discharge  Current Status/Progress Possible Resolutions Date Resolved   Physician    Medical stability;Incontinence     poor level of alertness interferes with therapy  will need longer LOS, apraxia      Nursing                  PT  Inaccessible home environment  2 STE              OT Incontinence;New oxygen;Other (comments)  foley catheter, unsure of family support  SLP                SW Decreased caregiver support;Home environment access/layout Annie-ex-wife may not want to provide physical care did not committ to it on assessment. May need ramp into home            Discharge Planning/Teaching Needs:  Plan for home with ex-wife and son to assist, will need to see if she wants and can provide min-mod assist level.      Team Discussion:  Goals min assist level, currently total assist. BP good and watching renal function. Foley home and follow up with Urology as an OP. Endurance and difficulty follwing commands. Yes/no accurate and severe apraxia which limits expressive speech. More alert today. Dys 1 honey thick liquid.  Need to see if family can provide this level of care.  Revisions to Treatment Plan:  Set DC date next week    Continued Need for Acute Rehabilitation Level of Care: The patient requires daily medical management by a physician with specialized training in physical medicine and rehabilitation for the following conditions: Daily direction of a multidisciplinary physical rehabilitation program to ensure safe treatment while eliciting the highest outcome that is of practical value to the patient.: Yes Daily medical management of patient stability for increased activity during participation in an intensive rehabilitation regime.: Yes Daily analysis of laboratory values and/or radiology reports with any subsequent need for medication adjustment of medical intervention for : Neurological problems;Mood/behavior problems   I attest that I was present, lead the team conference, and concur with the assessment and plan of the team.   Lucy Chris 04/12/2019, 3:23 PM

## 2019-04-12 NOTE — Telephone Encounter (Signed)
Received triage alert for 125 "AF" episodes detected since ILR implant on 04/07/19--alert transmission received today. Spoke with patient's nurse on 4W to request manual Carelink transmission for review. Instructions given. She will plan to transmit when patient returns from therapy.  Pt to continue metoprolol tartrate 25mg  TID at discharge per d/c summary, but do not see listed on active IP rehab med list. Will await transmission and discuss with MD.

## 2019-04-12 NOTE — Progress Notes (Signed)
Patient refused CPAP.

## 2019-04-12 NOTE — Progress Notes (Signed)
Speech Language Pathology Daily Session Note  Patient Details  Name: Gregory [name] MRN: [id number] Date of Birth: October 11, 1951  Today's Date: 04/12/2019 SLP Individual Time: 0900-1000 SLP Individual Time Calculation (min): 60 min  Short Term Goals: Week 1: SLP Short Term Goal 1 (Week 1): Pt will tolerate current diet (puree/honey thick liquid via tsp) without overt s/s aspiration given Mod A verbal and visual cues for use of safe swallow strategies. SLP Short Term Goal 2 (Week 1): Pt will identify objects in a field of 3 with supervision A multmodal cues.  SLP Short Term Goal 2 - Progress (Week 1): Updated due to goal met SLP Short Term Goal 3 (Week 1): Pt will follow basic one-step verbal directions with 75% accuracy given MaxA. SLP Short Term Goal 4 (Week 1): Pt will communicate basic wants/needs in response to yes/no questions Min A verbal and visual cues. SLP Short Term Goal 4 - Progress (Week 1): Discontinued (comment) SLP Short Term Goal 5 (Week 1): Pt will produce 3-5 vowel sounds and/or CV syllables with 50% accuracy given maxA verbal and visual cues.  Skilled Therapeutic Interventions: Skilled ST services focused on speech skills. Pt was alert an appeared willing to participate in therapy.  Receptive language: SLP facilitated receptive language skills in response to simple yes/no questions (10/10), complex yes/no questions (4/10), identified objects [facility] toolkit) via yes/no (10/10), identified objects in field of 2 (5/5) x1 semantic cue, identified objects in field of 3 (4/5) 1x vision cue scan R of midline. Pt demonstrated ability to express wants/needs via yes/no response mod A multimodal cues, for example gesturing to right leg with facial grimace and in response to yes/no questions expressed need to elevate leg as well as expressing need to use bathroom in response to yes/no questions. Pt demonstrated limited ability to follow 1 step body commands (1/5) max A and in bed mobility  during use of bed pan Max A verbal cues.  Expressive: SLP facilitated expressive language skills, in automatic/rote speech task, pt was unable to count 1-5, state name or greetings, however able to demonstrate intonation/inflection during "happy birthday" sung in unison. Pt produced speech sounds /ya, ta,da/ and was unable to imitate vowels given multimodal cues. Pt was left in room with call bell within reach and bed alarm set. ST recommends to continue skilled ST services.   As of note, nursing noted limited PO intake especially of solids, suggest due to food preference.      Pain Pain Assessment Pain Scale: Faces Pain Score: 0-No pain  Therapy/Group: Individual Therapy  Marisel Tostenson  [facility] 04/12/2019, 12:56 PM

## 2019-04-13 ENCOUNTER — Inpatient Hospital Stay (HOSPITAL_COMMUNITY): Payer: Medicare HMO | Admitting: Physical Therapy

## 2019-04-13 ENCOUNTER — Inpatient Hospital Stay (HOSPITAL_COMMUNITY): Payer: Medicare HMO | Admitting: Speech Pathology

## 2019-04-13 ENCOUNTER — Inpatient Hospital Stay (HOSPITAL_COMMUNITY): Payer: Medicare HMO | Admitting: Occupational Therapy

## 2019-04-13 LAB — RENAL FUNCTION PANEL
Albumin: 2.6 g/dL — ABNORMAL LOW (ref 3.5–5.0)
Anion gap: 12 (ref 5–15)
BUN: 86 mg/dL — ABNORMAL HIGH (ref 8–23)
CO2: 26 mmol/L (ref 22–32)
Calcium: 8.5 mg/dL — ABNORMAL LOW (ref 8.9–10.3)
Chloride: 106 mmol/L (ref 98–111)
Creatinine, Ser: 4.34 mg/dL — ABNORMAL HIGH (ref 0.61–1.24)
GFR calc Af Amer: 15 mL/min — ABNORMAL LOW (ref >60)
GFR calc non Af Amer: 13 mL/min — ABNORMAL LOW (ref >60)
Glucose, Bld: 95 mg/dL (ref 70–99)
Phosphorus: 5.4 mg/dL — ABNORMAL HIGH (ref 2.5–4.6)
Potassium: 6 mmol/L — ABNORMAL HIGH (ref 3.5–5.1)
Sodium: 144 mmol/L (ref 135–145)

## 2019-04-13 LAB — CBC
HCT: 32.3 % — ABNORMAL LOW (ref 39.0–52.0)
Hemoglobin: 9.4 g/dL — ABNORMAL LOW (ref 13.0–17.0)
MCH: 28.1 pg (ref 26.0–34.0)
MCHC: 29.1 g/dL — ABNORMAL LOW (ref 30.0–36.0)
MCV: 96.4 fL (ref 80.0–100.0)
Platelets: 182 10*3/uL (ref 150–400)
RBC: 3.35 MIL/uL — ABNORMAL LOW (ref 4.22–5.81)
RDW: 13.2 % (ref 11.5–15.5)
WBC: 9.2 10*3/uL (ref 4.0–10.5)
nRBC: 0 % (ref 0.0–0.2)

## 2019-04-13 LAB — POTASSIUM: Potassium: 5.8 mmol/L — ABNORMAL HIGH (ref 3.5–5.1)

## 2019-04-13 MED ORDER — SODIUM ZIRCONIUM CYCLOSILICATE 10 G PO PACK
10.0000 g | PACK | Freq: Once | ORAL | Status: AC
  Administered 2019-04-13: 10 g via ORAL
  Filled 2019-04-13: qty 1

## 2019-04-13 MED ORDER — FUROSEMIDE 10 MG/ML IJ SOLN
60.0000 mg | Freq: Once | INTRAMUSCULAR | Status: AC
  Administered 2019-04-13: 60 mg via INTRAVENOUS
  Filled 2019-04-13: qty 6

## 2019-04-13 NOTE — Plan of Care (Signed)
Min assist adls 

## 2019-04-13 NOTE — Progress Notes (Signed)
Patient ID: Gregory [name], male   DOB: January 05, 1951, 68 y.o.   MRN: [id number] [facility] Progress Note   Assessment/ Plan:   #  Acute kidney injury on chronic kidney disease stage III:  With bladder outlet obstruction/obstructive uropathy - to leave indwelling Foley until seen by urology as outpatient.   - Discontinue IV fluids - Renal function slowly improving  - It is highly likely that he has had this obstruction for >2 weeks and may have significant residual renal injury  # Hyperkalemia   - Lokelma once now - lasix IV once now  - Needs low potassium diet restriction - repeat K+ level at 6:00 pm  # Hypernatremia: Limited access to free water due to dysphagia precautions.  Resolved.  # Hypertension: acceptable   # Anemia: Stable; Replete iron stores and stable hemoglobin/hematocrit with resolution of hematuria.   # Elevated troponin levels: Trending down last eval  # Acute CVA with right-sided weakness/expressive aphasia: s/p speech eval and for rehab   Subjective:   Has been working with therapy and noted to have low oxygen sats - high 80's on room air with therapy.    Review of systems:  Shortness of breath Denies chest pain Denies nausea or vomiting  Continues with foley   Objective:   BP 130/88 (BP Location: Left Arm)   Pulse 60   Temp 97.9 F (36.6 C) (Oral)   Resp 18   Ht 5\' 4"  (1.626 m)   Wt 86.2 kg   SpO2 100%   BMI 32.62 kg/m   Intake/Output Summary (Last 24 hours) at 04/13/2019 1513 Last data filed at 04/13/2019 0750 Gross per 24 hour  Intake 510 ml  Output 1400 ml  Net -890 ml   Weight change:   Physical Exam: Gen: elderly male in bed in no acute distress  CVS: RRR; S1, S2 Resp: reduced breath sounds; 96% at rest on room air on dynamap Abd: Soft, obese, nontender Ext: 1+ edema lower extremities  Aphasia; awake and interactive  Imaging: No results found.  Labs: BMET Recent Labs  Lab 04/06/19 2019 04/07/19 0906  04/08/19 0357 04/09/19 0900 04/10/19 0518 04/11/19 0610 04/13/19 0517  NA 143 145 147* 142 142 139 144  K 5.5* 5.0 4.8 4.3 4.5 4.8 6.0*  CL 103 98 96* 96* 97* 96* 106  CO2 24 32 33* 32 30 28 26   GLUCOSE 109* 112* 103* 154* 120* 104* 95  BUN 138* 129* 127* 117* 104* 100* 86*  CREATININE 8.05* 7.33* 7.04* 6.01* 5.78* 5.13* 4.34*  CALCIUM 8.2* 7.9* 8.1* 8.1* 8.2* 8.1* 8.5*  PHOS  --   --   --   --   --   --  5.4*   CBC Recent Labs  Lab 04/08/19 0357 04/10/19 0518 04/11/19 0610 04/13/19 0517  WBC 10.7* 10.9* 10.2 9.2  NEUTROABS  --   --  6.7  --   HGB 9.5* 10.0* 9.6* 9.4*  HCT 30.6* 33.5* 32.5* 32.3*  MCV 90.5 93.6 93.9 96.4  PLT 150 183 169 182    Medications:    . aspirin  300 mg Rectal Daily   Or  . aspirin  325 mg Oral Daily  . atorvastatin  80 mg Oral q1800  . clopidogrel  75 mg Oral Daily  . methylphenidate  5 mg Oral BID WC  . metoprolol tartrate  25 mg Oral BID   [name] 04/13/2019

## 2019-04-13 NOTE — Progress Notes (Signed)
Social Work Patient ID: [name], male   DOB: February 26, 1951, 68 y.o.   MRN: [id number] Spoke with Chavez-son via telephone to inform team conferencee goals min-mod level and ELOS 3-4 weeks. He reports he will be helping his Mom with Dad's care. Discussed the fact pt will require 24 hr hands on-physical care at discharge. Son seems to understand this and is planning on providing this. Will continue to work on discharge needs and have family come in prior to discharge.

## 2019-04-13 NOTE — Progress Notes (Signed)
Occupational Therapy Session Note  Patient Details  Name: Gregory [name] MRN: [id number] Date of Birth: 06/26/1951  Today's Date: 04/13/2019 OT Individual Time: 1230-1330 OT Individual Time Calculation (min): 60 min    Short Term Goals: Week 1:  OT Short Term Goal 1 (Week 1): Pt will complete LB dressing with max assist of one caregiver OT Short Term Goal 2 (Week 1): Pt will complete bathing with mod assist to demonstrate increased initiation and sequencing OT Short Term Goal 3 (Week 1): Pt will complete toilet transfer with max assist of one caregiver OT Short Term Goal 4 (Week 1): Pt will complete UB dressing with mod assist  Skilled Therapeutic Interventions/Progress Updates:    Pt completed shaving and dressing sit to stand a the sink to start the session.  He needed max assist for shaving during the session.  Mod assist for him to apply shaving cream with the RUE.   Once shaving was completed, he was able to work on dressing sit to stand.  Max demonstrational cueing with mod assist for pullover shirt.  Max assist sit to stand to donn pull up pants as well.  Last part of session focused on RUE functional movement and reaching from the wheelchair level.  He was able to demonstrate some active elbow extension and flexion with max facilitation for reaching up to the sink to grasp a bottle of soap.  Increased flexor tone noted in the elbow as well as internal rotators and supinators.  Pt able to respond with head nods to yes/no questions regarding place, name, reason for hospitalization with 75% accuracy.  Finished session with call button and phone in reach with safety belt in place.  RUE supported on half lap tray at end of session.    Therapy Documentation Precautions:  Precautions Precautions: Fall Precaution Comments: global aphasia, right hemiparesis Restrictions Weight Bearing Restrictions: No  Pain: Pain Assessment Pain Scale: Faces Pain Score: 0-No pain Faces Pain Scale: No  hurt ADL: See Care Tool for some details of ADLs  Therapy/Group: Individual Therapy  Zimal Weisensel OTR/L 04/13/2019, 3:49 PM

## 2019-04-13 NOTE — Progress Notes (Signed)
[place] PHYSICAL MEDICINE & REHABILITATION PROGRESS NOTE   Subjective/Complaints:  Remains aphasic unable to express  ROS- unable due to aphasia  Objective:   No results found. Recent Labs    04/11/19 0610 04/13/19 0517  WBC 10.2 9.2  HGB 9.6* 9.4*  HCT 32.5* 32.3*  PLT 169 182   Recent Labs    04/11/19 0610 04/13/19 0517  NA 139 144  K 4.8 6.0*  CL 96* 106  CO2 28 26  GLUCOSE 104* 95  BUN 100* 86*  CREATININE 5.13* 4.34*  CALCIUM 8.1* 8.5*    Intake/Output Summary (Last 24 hours) at 04/13/2019 0932 Last data filed at 04/13/2019 0750 Gross per 24 hour  Intake 510 ml  Output 1975 ml  Net -1465 ml     Physical Exam: Vital Signs Blood pressure 126/75, pulse 60, temperature 98 F (36.7 C), temperature source Oral, resp. rate 18, height 5\' 4"  (1.626 m), weight 86.2 kg, SpO2 100 %.   General: No acute distress Mood and affect are appropriate Heart: irreg rate and rhythm no rubs murmurs or extra sounds Lungs: Clear to auscultation, breathing unlabored, no rales or wheezes Abdomen: Positive bowel sounds, soft nontender to palpation, mildly distended Extremities: No clubbing, cyanosis, or edema Skin: No evidence of breakdown, no evidence of rash Neurologic: Cranial nerves II through XII intact, motor strength is 5/5 in left  deltoid, bicep, tricep, grip, hip flexor, knee extensors, ankle dorsiflexor and plantar flexor Sensory exam normal sensation to light touch and proprioception in bilateral upper and lower extremities 2- RIght biceps and triceps, 0/5 delt and grip 3- R HF, KE, ADF Cerebellar exam normal finger to nose to finger as well as heel to shin in bilateral upper and lower extremities Musculoskeletal: Full range of motion in all 4 extremities. No joint swelling   Assessment/Plan: 1. Functional deficits secondary to Left insular and posterior frontal infarct which require 3+ hours per day of interdisciplinary therapy in a comprehensive inpatient  rehab setting.  Physiatrist is providing close team supervision and 24 hour management of active medical problems listed below.  Physiatrist and rehab team continue to assess barriers to discharge/monitor patient progress toward functional and medical goals  Care Tool:  Bathing    Body parts bathed by patient: Chest, Abdomen, Right arm, Left upper leg, Right lower leg, Face   Body parts bathed by helper: Front perineal area, Buttocks, Left lower leg, Right upper leg, Left arm     Bathing assist Assist Level: 2 Helpers     Upper Body Dressing/Undressing Upper body dressing   What is the patient wearing?: Hospital gown only    Upper body assist Assist Level: Dependent - Patient 0%    Lower Body Dressing/Undressing Lower body dressing      What is the patient wearing?: Incontinence brief     Lower body assist Assist for lower body dressing: 2 Helpers     Toileting Toileting    Toileting assist Assist for toileting: 2 Helpers     Transfers Chair/bed transfer  Transfers assist  Chair/bed transfer activity did not occur: Safety/medical concerns  Chair/bed transfer assist level: 2 Helpers     Locomotion Ambulation   Ambulation assist   Ambulation activity did not occur: Safety/medical concerns          Walk 10 feet activity   Assist  Walk 10 feet activity did not occur: Safety/medical concerns        Walk 50 feet activity   Assist Walk 50 feet  with 2 turns activity did not occur: Safety/medical concerns         Walk 150 feet activity   Assist Walk 150 feet activity did not occur: Safety/medical concerns         Walk 10 feet on uneven surface  activity   Assist Walk 10 feet on uneven surfaces activity did not occur: Safety/medical concerns         Wheelchair     Assist Will patient use wheelchair at discharge?: Yes Type of Wheelchair: Manual    Wheelchair assist level: Dependent - Patient 0% Max wheelchair distance:  150 ft    Wheelchair 50 feet with 2 turns activity    Assist        Assist Level: Dependent - Patient 0%   Wheelchair 150 feet activity     Assist     Assist Level: Dependent - Patient 0%    Medical Problem List and Plan: 1.Right hemiparesis and aphasia/dysphagiasecondary to left insular and posterior frontal infarct embolic secondary to non-STEMI.status post loop recorder Continue CIR level PT OT 2. Antithrombotics: -DVT/anticoagulation:SCDs -antiplatelet therapy: aspirin 325 mg daily and add clopidigrel-  hematuria improved after Foley has been secured 3. Pain Management:Tylenol as needed 4. Mood:Provide emotional support -antipsychotic agents: N/A 5. Neuropsych: This patientis notcapable of making decisions on hisown behalf. 6. Skin/Wound Care:Routine skin checks -local care to loop recorder site as needed 7. Fluids/Electrolytes/Nutrition:Routine in and out's BUN trending down, IV fluids continuous 75 cc/h-encourage PO 8. Dysphagia. Dysphagia #1Honeythick liquids. Monitor hydration. Speech therapy follow-up 9. Multifocal atrial tachycardia/hypertension. Lopressor 25 mg 3 times a day, Procardia 30 mg daily. Follow-up cardiology services 10. 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 Follow-up urology as well as nephrology services.FOLEY TUBE REMAINS IN PLACE UNTIL FOLLOW UPASOUT PATIENT WITH UROLOGY, urine output close to 2000 mL on 04/12/2019 BUN /Creat slowly trending down 11. Hyperlipidemia. Lipitor 12. Morbid obesity/OSA. Continue CPAP    LOS: 3 days A FACE TO FACE EVALUATION WAS PERFORMED  Erick Colacendrew E Zareah Hunzeker 04/13/2019, 9:32 AM

## 2019-04-13 NOTE — Progress Notes (Signed)
Physical Therapy Session Note  Patient Details  Name: Gregory [name] MRN: [id number] Date of Birth: 07/19/1951  Today's Date: 04/13/2019 PT Individual Time: 1447-1530 PT Individual Time Calculation (min): 43 min   and  Today's Date: 04/13/2019 PT Missed Time: 17 Minutes Missed Time Reason: MD hold (Comment)(due to SpO2 levels with MD plans to adjust medication management for improved breathing)  Short Term Goals: Week 1:  PT Short Term Goal 1 (Week 1): Pt will perform bed<>chair transfer with mod assist PT Short Term Goal 2 (Week 1): Pt will initiate gait training PT Short Term Goal 3 (Week 1): Pt will perform bed mobility with min assist  Skilled Therapeutic Interventions/Progress Updates:   Missed 17 minutes. Pt received sitting in w/c and agreeable to therapy session. RN present to saline lock IV for therapy session. Pt transported to/from gym in w/c. Pt performed L lateral scoot/squat pivot transfer w/c to EOM with max assist for lifting and pivoting hips and cuing for sequencing and head/hips relationship. Pt participated in sitting balance task of cross body reaching focusing on trunk control with anterior and left lateral trunk lean as well as R UE neuromuscular re-education with hand-over-hand assistance to grasp cups - close supervision with intermittent mod assist provided throughout due to R lateral LOB when reaching to the R. Therapist assessed SpO2 noted to be 85-88% on room air via pulse ox - instructed pt on increased depth and pursed lip breathing with minimal recovery in SpO2 level - monitored SpO2 and pt symptoms with pt denying SOB during session. Pt performed repeated sit<>stand from EOM to no AD with mirror feedback for increased upright posture - max assist for balance and R knee extension control - multimodal cuing for anterior trunk lean and hip/knee extension to achieve upright - pt unable to achieve full upright despite cuing and assist due to lack of hip extension and  increased anterior trunk lean. Pt performed R lateral scoot/squat pivot transfer EOM to w/c with max assist for lifting/pivoting hips with cuing for sequencing and head/hips relationship. MD arrived and therapist notified MD of SpO2 levels - pt transported back to room and vitals assessed via dynamap: BP 128/73 (MAP 90), HR 81bpm, and SpO2 100% on room air. MD deferred continuing therapy at this time; RN present and aware; pt missed 17 minutes of therapy. Pt left sitting in w/c with needs in reach and seat belt alarm on.   Therapy Documentation Precautions:  Precautions Precautions: Fall Precaution Comments: global aphasia, right hemiparesis Restrictions Weight Bearing Restrictions: No  Pain: Reports R quadriceps muscle soreness during sit<>stand but denies increase in soreness with activity - this does not limit pt participation.    Therapy/Group: Individual Therapy  Ginny Forth, PT, DPT 04/13/2019, 3:46 PM

## 2019-04-13 NOTE — Progress Notes (Signed)
Patient slept well throughout the night. No complaints noted. Incontinent of stool x1 this shift. Refused CPAP at hs, wore oxygen throughout the night. No distress noted.

## 2019-04-13 NOTE — Progress Notes (Addendum)
Occupational Therapy Session Note  Patient Details  Name: Gregory [name] MRN: [id number] Date of Birth: Sep 06, 1951  Today's Date: 04/13/2019 OT Individual Time: 1040-1130 OT Individual Time Calculation (min): 50 min    Skilled Therapeutic Interventions/Progress Updates: Much of first porition of session was allowing patient to practice expressive communication.    He kept pointing to a note his SPL wrote and left lying on his lap on bed covers.   He kept pointing at note and trying to express himself verbally.    Note stated he wanted to shave and needed pain meds for his right leg (10/10 pain).  RN gave Tylenol RN and tech were determining whether patient was diabetic and on blood thinners before they attempt to do either.  Patient had no clothing and there were no paper scrubs to fit him (size small  And size large only, but patient appeared to fit size XL pants) Rest of the session, patient was assisted with bed mobility, balance and stedy transfer as follows:   Supine to bed of bed=min A;   Sit to stand in prep for ADLs and transfers=moderate asisstance (from bed to standing in stedy); patient initial non supported sitting balance on stedy= poor+ and he initially leaned forward and slightly laterally right, but with time and opportunities to right himself, he was able to demonstrate fair-static sitting balance. stedy to w/c transfer=min assistance for lowering himself with control into chair  Patient also educated on completing right elbow extension to pain tolerance with use of his left UE.   Patient with fairly poor return demonstration.  Patient was left with chair alarm and call bell in place     Therapy Documentation Precautions:  Precautions Precautions: Fall Precaution Comments: global aphasia, right hemiparesis Restrictions Weight Bearing Restrictions: No  Pain:10/10  RN gave Tylenol  Therapy/Group: Individual Therapy  Rozelle Logan 04/13/2019, 9:19 PM

## 2019-04-13 NOTE — Progress Notes (Signed)
Speech Language Pathology Daily Session Note  Patient Details  Name: Gregory [name] MRN: [id number] Date of Birth: Dec 07, 1951  Today's Date: 04/13/2019 SLP Individual Time: 0935-1030 SLP Individual Time Calculation (min): 55 min  Short Term Goals: Week 1: SLP Short Term Goal 1 (Week 1): Pt will tolerate current diet (puree/honey thick liquid via tsp) without overt s/s aspiration given Mod A verbal and visual cues for use of safe swallow strategies. SLP Short Term Goal 2 (Week 1): Pt will identify objects in a field of 3 with supervision A multmodal cues.  SLP Short Term Goal 2 - Progress (Week 1): Updated due to goal met SLP Short Term Goal 3 (Week 1): Pt will follow basic one-step verbal directions with 75% accuracy given MaxA. SLP Short Term Goal 4 (Week 1): Pt will communicate basic wants/needs in response to yes/no questions Min A verbal and visual cues. SLP Short Term Goal 4 - Progress (Week 1): Discontinued (comment) SLP Short Term Goal 5 (Week 1): Pt will produce 3-5 vowel sounds and/or CV syllables with 50% accuracy given maxA verbal and visual cues.  Skilled Therapeutic Interventions: Pt was seen for skilled ST intervention targeting goals for effective communication. Pt is demonstrating improvement in receptive language skills, as evidenced by following 1-step verbal directions given no cues with 60% accuracy. Inaccurate responses may be related to significant apraxia, rather than auditory comprehension deficits. Pt was able to identify objects correctly in a field of 7 without cues, and matched single word to object (field of 5). He was unable to demonstrate comprehension of phrase length written material. Pt was stimulable for /ta/ and /ch/ today, and approximated "Elenore Rota". As seen yesterday, pt was able to demonstrate intonation/inflection during happy birthday sung in unison with SLP. He was stimulable for 2/9 (22%) articulatory positions (Apraxia Self-Cueing program), including  lips together (/m/) and open mouth (/h/). Pt was left in bed with alarm on, all needs within reach. Continued ST intervention is recommended.  Communication board is in process, but will need to be fairly simple for effective scanning for communication of wants and needs.  Pain Pain Assessment Pain Scale: Faces Pain Score: 4  Faces Pain Scale: No hurt Pain Location: Knee Pain Orientation: Right Pain Descriptors / Indicators: Grimacing Pain Intervention(s): Medication (See eMAR)  Therapy/Group: Individual Therapy   Shere Eisenhart B. Quentin Ore, [facility], [place] Speech Language Pathologist  Shonna Chock 04/13/2019, 1:40 PM

## 2019-04-14 ENCOUNTER — Inpatient Hospital Stay (HOSPITAL_COMMUNITY): Payer: Medicare HMO | Admitting: Physical Therapy

## 2019-04-14 ENCOUNTER — Inpatient Hospital Stay (HOSPITAL_COMMUNITY): Payer: Medicare HMO | Admitting: Occupational Therapy

## 2019-04-14 ENCOUNTER — Inpatient Hospital Stay (HOSPITAL_COMMUNITY): Payer: Medicare HMO | Admitting: Speech Pathology

## 2019-04-14 ENCOUNTER — Encounter (HOSPITAL_COMMUNITY): Payer: Self-pay | Admitting: Family Medicine

## 2019-04-14 ENCOUNTER — Inpatient Hospital Stay (HOSPITAL_COMMUNITY)
Admission: AD | Admit: 2019-04-14 | Discharge: 2019-04-18 | DRG: 682 | Disposition: A | Discharge status: Rehabilitation Facility | Payer: Medicare HMO | Source: Other Acute Inpatient Hospital | Attending: Internal Medicine | Admitting: Internal Medicine

## 2019-04-14 DIAGNOSIS — Z9989 Dependence on other enabling machines and devices: Secondary | ICD-10-CM

## 2019-04-14 DIAGNOSIS — Z7902 Long term (current) use of antithrombotics/antiplatelets: Secondary | ICD-10-CM

## 2019-04-14 DIAGNOSIS — I739 Peripheral vascular disease, unspecified: Secondary | ICD-10-CM | POA: Diagnosis present

## 2019-04-14 DIAGNOSIS — R7989 Other specified abnormal findings of blood chemistry: Secondary | ICD-10-CM | POA: Diagnosis present

## 2019-04-14 DIAGNOSIS — R1310 Dysphagia, unspecified: Secondary | ICD-10-CM | POA: Diagnosis present

## 2019-04-14 DIAGNOSIS — R4701 Aphasia: Secondary | ICD-10-CM | POA: Diagnosis present

## 2019-04-14 DIAGNOSIS — I6340 Cerebral infarction due to embolism of unspecified cerebral artery: Secondary | ICD-10-CM | POA: Diagnosis present

## 2019-04-14 DIAGNOSIS — N1330 Unspecified hydronephrosis: Secondary | ICD-10-CM | POA: Diagnosis present

## 2019-04-14 DIAGNOSIS — N401 Enlarged prostate with lower urinary tract symptoms: Secondary | ICD-10-CM | POA: Diagnosis present

## 2019-04-14 DIAGNOSIS — I639 Cerebral infarction, unspecified: Secondary | ICD-10-CM | POA: Diagnosis present

## 2019-04-14 DIAGNOSIS — R1311 Dysphagia, oral phase: Secondary | ICD-10-CM | POA: Diagnosis present

## 2019-04-14 DIAGNOSIS — I214 Non-ST elevation (NSTEMI) myocardial infarction: Secondary | ICD-10-CM | POA: Diagnosis present

## 2019-04-14 DIAGNOSIS — E870 Hyperosmolality and hypernatremia: Secondary | ICD-10-CM | POA: Diagnosis present

## 2019-04-14 DIAGNOSIS — I6300 Cerebral infarction due to thrombosis of unspecified precerebral artery: Secondary | ICD-10-CM

## 2019-04-14 DIAGNOSIS — N320 Bladder-neck obstruction: Secondary | ICD-10-CM | POA: Diagnosis present

## 2019-04-14 DIAGNOSIS — I63412 Cerebral infarction due to embolism of left middle cerebral artery: Secondary | ICD-10-CM

## 2019-04-14 DIAGNOSIS — E875 Hyperkalemia: Secondary | ICD-10-CM | POA: Diagnosis present

## 2019-04-14 DIAGNOSIS — Z79899 Other long term (current) drug therapy: Secondary | ICD-10-CM

## 2019-04-14 DIAGNOSIS — R319 Hematuria, unspecified: Secondary | ICD-10-CM | POA: Diagnosis present

## 2019-04-14 DIAGNOSIS — Z9689 Presence of other specified functional implants: Secondary | ICD-10-CM | POA: Diagnosis present

## 2019-04-14 DIAGNOSIS — G8191 Hemiplegia, unspecified affecting right dominant side: Secondary | ICD-10-CM | POA: Diagnosis present

## 2019-04-14 DIAGNOSIS — N179 Acute kidney failure, unspecified: Principal | ICD-10-CM | POA: Diagnosis present

## 2019-04-14 DIAGNOSIS — N183 Chronic kidney disease, stage 3 unspecified: Secondary | ICD-10-CM | POA: Diagnosis present

## 2019-04-14 DIAGNOSIS — E785 Hyperlipidemia, unspecified: Secondary | ICD-10-CM | POA: Diagnosis present

## 2019-04-14 DIAGNOSIS — Z7982 Long term (current) use of aspirin: Secondary | ICD-10-CM

## 2019-04-14 DIAGNOSIS — G4733 Obstructive sleep apnea (adult) (pediatric): Secondary | ICD-10-CM | POA: Diagnosis present

## 2019-04-14 DIAGNOSIS — I2510 Atherosclerotic heart disease of native coronary artery without angina pectoris: Secondary | ICD-10-CM | POA: Diagnosis present

## 2019-04-14 DIAGNOSIS — I471 Supraventricular tachycardia: Secondary | ICD-10-CM

## 2019-04-14 DIAGNOSIS — I10 Essential (primary) hypertension: Secondary | ICD-10-CM | POA: Diagnosis present

## 2019-04-14 DIAGNOSIS — D631 Anemia in chronic kidney disease: Secondary | ICD-10-CM | POA: Diagnosis present

## 2019-04-14 DIAGNOSIS — I129 Hypertensive chronic kidney disease with stage 1 through stage 4 chronic kidney disease, or unspecified chronic kidney disease: Secondary | ICD-10-CM | POA: Diagnosis present

## 2019-04-14 LAB — RENAL FUNCTION PANEL
Albumin: 2.8 g/dL — ABNORMAL LOW (ref 3.5–5.0)
Anion gap: 14 (ref 5–15)
BUN: 80 mg/dL — ABNORMAL HIGH (ref 8–23)
CO2: 26 mmol/L (ref 22–32)
Calcium: 9.3 mg/dL (ref 8.9–10.3)
Chloride: 103 mmol/L (ref 98–111)
Creatinine, Ser: 3.94 mg/dL — ABNORMAL HIGH (ref 0.61–1.24)
GFR calc Af Amer: 17 mL/min — ABNORMAL LOW (ref >60)
GFR calc non Af Amer: 15 mL/min — ABNORMAL LOW (ref >60)
Glucose, Bld: 94 mg/dL (ref 70–99)
Phosphorus: 5.4 mg/dL — ABNORMAL HIGH (ref 2.5–4.6)
Potassium: 5.7 mmol/L — ABNORMAL HIGH (ref 3.5–5.1)
Sodium: 143 mmol/L (ref 135–145)

## 2019-04-14 LAB — BASIC METABOLIC PANEL
Anion gap: 13 (ref 5–15)
Anion gap: 13 (ref 5–15)
BUN: 78 mg/dL — ABNORMAL HIGH (ref 8–23)
BUN: 82 mg/dL — ABNORMAL HIGH (ref 8–23)
CO2: 24 mmol/L (ref 22–32)
CO2: 24 mmol/L (ref 22–32)
Calcium: 9.3 mg/dL (ref 8.9–10.3)
Calcium: 9 mg/dL (ref 8.9–10.3)
Chloride: 103 mmol/L (ref 98–111)
Chloride: 103 mmol/L (ref 98–111)
Creatinine, Ser: 4.05 mg/dL — ABNORMAL HIGH (ref 0.61–1.24)
Creatinine, Ser: 4.03 mg/dL — ABNORMAL HIGH (ref 0.61–1.24)
GFR calc Af Amer: 16 mL/min — ABNORMAL LOW (ref >60)
GFR calc Af Amer: 16 mL/min — ABNORMAL LOW (ref >60)
GFR calc non Af Amer: 14 mL/min — ABNORMAL LOW (ref >60)
GFR calc non Af Amer: 14 mL/min — ABNORMAL LOW (ref >60)
Glucose, Bld: 85 mg/dL (ref 70–99)
Glucose, Bld: 168 mg/dL — ABNORMAL HIGH (ref 70–99)
Potassium: 6.3 mmol/L (ref 3.5–5.1)
Potassium: 6.3 mmol/L (ref 3.5–5.1)
Sodium: 140 mmol/L (ref 135–145)
Sodium: 140 mmol/L (ref 135–145)

## 2019-04-14 LAB — NA AND K (SODIUM & POTASSIUM), RAND UR
Potassium Urine: 10 mmol/L
Sodium, Ur: 132 mmol/L

## 2019-04-14 LAB — POTASSIUM: Potassium: 6.7 mmol/L (ref 3.5–5.1)

## 2019-04-14 MED ORDER — HEPARIN SODIUM (PORCINE) 5000 UNIT/ML IJ SOLN
5000.0000 [IU] | Freq: Three times a day (TID) | INTRAMUSCULAR | Status: DC
  Administered 2019-04-14 – 2019-04-18 (×12): 5000 [IU] via SUBCUTANEOUS
  Filled 2019-04-14 (×12): qty 1

## 2019-04-14 MED ORDER — ATORVASTATIN CALCIUM 80 MG PO TABS
80.0000 mg | ORAL_TABLET | Freq: Every day | ORAL | Status: DC
  Administered 2019-04-15 – 2019-04-17 (×3): 80 mg via ORAL
  Filled 2019-04-14 (×3): qty 1

## 2019-04-14 MED ORDER — SODIUM ZIRCONIUM CYCLOSILICATE 10 G PO PACK
10.0000 g | PACK | Freq: Once | ORAL | Status: AC
  Administered 2019-04-14: 10 g via ORAL
  Filled 2019-04-14: qty 1

## 2019-04-14 MED ORDER — INSULIN ASPART 100 UNIT/ML IV SOLN: 5.0000 [IU] | Freq: Once | INTRAVENOUS | Status: DC

## 2019-04-14 MED ORDER — ONDANSETRON HCL 4 MG PO TABS: 4.0000 mg | ORAL_TABLET | Freq: Four times a day (QID) | ORAL | Status: DC | PRN

## 2019-04-14 MED ORDER — SODIUM CHLORIDE 0.9 % IV SOLN: 250.0000 mL | INTRAVENOUS | Status: DC | PRN

## 2019-04-14 MED ORDER — CLOPIDOGREL BISULFATE 75 MG PO TABS
75.0000 mg | ORAL_TABLET | Freq: Every day | ORAL | Status: DC
  Administered 2019-04-15 – 2019-04-18 (×4): 75 mg via ORAL
  Filled 2019-04-14 (×4): qty 1

## 2019-04-14 MED ORDER — SODIUM CHLORIDE 0.9% FLUSH
3.0000 mL | Freq: Two times a day (BID) | INTRAVENOUS | Status: DC
  Administered 2019-04-14: 23:00:00 3 mL via INTRAVENOUS

## 2019-04-14 MED ORDER — CALCIUM GLUCONATE-NACL 1-0.675 GM/50ML-% IV SOLN
1.0000 g | Freq: Once | INTRAVENOUS | Status: AC
  Administered 2019-04-15: 1000 mg via INTRAVENOUS
  Filled 2019-04-14: qty 50

## 2019-04-14 MED ORDER — INSULIN ASPART 100 UNIT/ML ~~LOC~~ SOLN
5.0000 [IU] | Freq: Once | SUBCUTANEOUS | Status: AC
  Administered 2019-04-14: 22:00:00 5 [IU] via INTRAVENOUS

## 2019-04-14 MED ORDER — SODIUM POLYSTYRENE SULFONATE 15 GM/60ML PO SUSP
60.0000 g | Freq: Once | ORAL | Status: AC
  Administered 2019-04-14: 23:00:00 60 g via RECTAL
  Filled 2019-04-14: qty 240

## 2019-04-14 MED ORDER — ACETAMINOPHEN 650 MG RE SUPP: 650.0000 mg | Freq: Four times a day (QID) | RECTAL | Status: DC | PRN

## 2019-04-14 MED ORDER — HYDROCERIN EX CREA
TOPICAL_CREAM | Freq: Two times a day (BID) | CUTANEOUS | Status: DC | PRN
  Filled 2019-04-14: qty 113

## 2019-04-14 MED ORDER — SODIUM CHLORIDE 0.9% FLUSH
3.0000 mL | Freq: Two times a day (BID) | INTRAVENOUS | Status: DC
  Administered 2019-04-14 – 2019-04-18 (×5): 3 mL via INTRAVENOUS

## 2019-04-14 MED ORDER — ONDANSETRON HCL 4 MG/2ML IJ SOLN: 4.0000 mg | Freq: Four times a day (QID) | INTRAMUSCULAR | Status: DC | PRN

## 2019-04-14 MED ORDER — DEXTROSE 50 % IV SOLN
1.0000 | Freq: Once | INTRAVENOUS | Status: DC
  Filled 2019-04-14: qty 50

## 2019-04-14 MED ORDER — SODIUM POLYSTYRENE SULFONATE 15 GM/60ML PO SUSP
60.0000 g | Freq: Once | ORAL | Status: DC
  Filled 2019-04-14: qty 240

## 2019-04-14 MED ORDER — DEXTROSE 50 % IV SOLN
INTRAVENOUS | Status: AC
  Administered 2019-04-14: 50 mL via INTRAVENOUS
  Filled 2019-04-14: qty 50

## 2019-04-14 MED ORDER — ASPIRIN 300 MG RE SUPP: 300.0000 mg | Freq: Every day | RECTAL | Status: DC

## 2019-04-14 MED ORDER — FUROSEMIDE 10 MG/ML IJ SOLN
60.0000 mg | Freq: Once | INTRAMUSCULAR | Status: AC
  Administered 2019-04-14: 60 mg via INTRAVENOUS
  Filled 2019-04-14: qty 6

## 2019-04-14 MED ORDER — SODIUM CHLORIDE 0.9% FLUSH: 3.0000 mL | INTRAVENOUS | Status: DC | PRN

## 2019-04-14 MED ORDER — SODIUM ZIRCONIUM CYCLOSILICATE 10 G PO PACK
10.0000 g | PACK | Freq: Two times a day (BID) | ORAL | Status: DC
  Filled 2019-04-14 (×2): qty 1

## 2019-04-14 MED ORDER — ACETAMINOPHEN 325 MG PO TABS: 650.0000 mg | ORAL_TABLET | Freq: Four times a day (QID) | ORAL | Status: DC | PRN

## 2019-04-14 MED ORDER — METOPROLOL TARTRATE 25 MG PO TABS
25.0000 mg | ORAL_TABLET | Freq: Two times a day (BID) | ORAL | Status: DC
  Administered 2019-04-14 – 2019-04-18 (×8): 25 mg via ORAL
  Filled 2019-04-14 (×8): qty 1

## 2019-04-14 MED ORDER — DEXTROSE 50 % IV SOLN
1.0000 | Freq: Once | INTRAVENOUS | Status: AC
  Administered 2019-04-14: 50 mL via INTRAVENOUS

## 2019-04-14 MED ORDER — ASPIRIN 325 MG PO TABS
325.0000 mg | ORAL_TABLET | Freq: Every day | ORAL | Status: DC
  Administered 2019-04-15 – 2019-04-18 (×4): 325 mg via ORAL
  Filled 2019-04-14 (×4): qty 1

## 2019-04-14 MED ORDER — LACTULOSE ENEMA
300.0000 mL | Freq: Once | ORAL | Status: DC
  Filled 2019-04-14: qty 300

## 2019-04-14 NOTE — Significant Event (Signed)
CRITICAL VALUE ALERT  Critical Value:  K 6.3  Date & Time Notied:  04/14/2019  Provider Notified: Dr. Arrie Aran, Dr. Wynn Banker  Orders Received/Actions taken: needs d50 and iv insulin.  Should be transferred to tele unit.   RR nurse notified of updates.  Working on having patient admitted.

## 2019-04-14 NOTE — H&P (Addendum)
History and Physical    Gregory [name] HCW:[id number] DOB: 09/03/1951 DOA: 04/14/2019  PCP: System, Pcp Not In   Patient coming from: Inpatient Rehabilitation   Chief Complaint: Hyperkalemia   HPI: Gregory [name] is a 68 y.o. male with medical history significant for CKD III, hypertension, and hyperlipidemia who was admitted to the hospital on 04/05/2019 with acute onset of expressive aphasia and right-sided weakness, and was admitted with acute CVA.  He was also found to have acute kidney injury on admission with hyperkalemia, and the admission was also complicated by NSTEMI.  Neurology, cardiology, and nephrology were consulted during the admission.  Patient was found to have bilateral hydronephrosis secondary to bladder outlet obstruction and this was relieved with Foley catheter.  Troponin peaked at 17.34, and the patient was medically managed with statin and beta-blocker. BUN and creatinine 158 and 10.14 on admission, improving to just under 6 by time of discharge, and hyperkalemia had resolved.  Patient continued to experience expressive aphasia, right-sided hemiplegia, and dysphasia.  Loop recorder was placed.   He was discharged to inpatient rehabilitation on 04/10/2019, nephrology continue to follow the patient, hyperkalemia returned, did not respond as expected to Lasix. EKG was performed, reviewed at bedside with normal QRS and no peaking of T-waves. Patient was unable to take [facility] due to dysphagia, and nephrology recommended kayexalate enema and insulin with dextrose but was told that patient would have to be admitted back to the hospital in order to give those therapies.   Review of Systems:  All other systems reviewed and apart from HPI, are negative.  Past Medical History:  Diagnosis Date  . AKI (acute kidney injury) (HCC)   . CKD (chronic kidney disease) stage 2, GFR 60-89 ml/min   . CVA (cerebral vascular accident) (HCC)   . Hyperlipidemia   . Hypertension   . PVD  (peripheral vascular disease) (HCC)     Past Surgical History:  Procedure Laterality Date  . AORTIC ARCH ANGIOGRAPHY N/A 09/09/2018   Procedure: AORTIC ARCH ANGIOGRAPHY;  Surgeon: Sherren Kerns, MD;  Location: [facility] INVASIVE CV LAB;  Service: Cardiovascular;  Laterality: N/A;  . CAROTID ANGIOGRAPHY Bilateral 09/09/2018   Procedure: CAROTID ANGIOGRAPHY;  Surgeon: Sherren Kerns, MD;  Location: [facility] INVASIVE CV LAB;  Service: Cardiovascular;  Laterality: Bilateral;  . LOOP RECORDER INSERTION N/A 04/07/2019   Procedure: LOOP RECORDER INSERTION;  Surgeon: Marinus Maw, MD;  Location: [facility] INVASIVE CV LAB;  Service: Cardiovascular;  Laterality: N/A;     has no history on file for tobacco, alcohol, and drug.  No Known Allergies  History reviewed. No pertinent family history.   Prior to Admission medications   Medication Sig Start Date End Date Taking? Authorizing Provider  acetaminophen (TYLENOL) 325 MG tablet Take 2 tablets (650 mg total) by mouth every 4 (four) hours as needed for mild pain, fever or headache. 04/10/19   Shon Hale, MD  allopurinol (ZYLOPRIM) 300 MG tablet Take 300 mg by mouth daily.    [provider]  aspirin EC 81 MG tablet Take 1 tablet (81 mg total) by mouth daily with breakfast. 04/10/19   Shon Hale, MD  atorvastatin (LIPITOR) 80 MG tablet Take 1 tablet (80 mg total) by mouth daily at 6 PM. 09/05/18   Lanelle Bal, MD  clopidogrel (PLAVIX) 75 MG tablet Take 1 tablet (75 mg total) by mouth daily. 04/10/19 04/09/20  Shon Hale, MD  metoprolol tartrate (LOPRESSOR) 25 MG tablet Take 1 tablet (25 mg total) by  mouth 3 (three) times daily. 04/10/19   Shon Hale, MD    Physical Exam: There were no vitals filed for this visit.  Constitutional: NAD, calm  Eyes: PERTLA, lids and conjunctivae normal ENMT: Mucous membranes are moist. Posterior pharynx clear of any exudate or lesions.   Neck: normal, supple, no masses, no thyromegaly  Respiratory: clear to auscultation bilaterally, no wheezing, no crackles. No accessory muscle use.  Cardiovascular: Rate ~60 and irregular. No extremity edema.   Abdomen: No distension, no tenderness, soft. Bowel sounds normal.  Musculoskeletal: no clubbing / cyanosis. No joint deformity upper and lower extremities.    Skin: no significant rashes, lesions, ulcers. Warm, dry, well-perfused. Neurologic: Expressive aphasia. Right-sided hemiplegia.   Psychiatric: Awake, makes eye-contact and smiles. Answers yes/no questions with head movements.    Labs on Admission: I have personally reviewed following labs and imaging studies  CBC: Recent Labs  Lab 04/08/19 0357 04/10/19 0518 04/11/19 0610 04/13/19 0517  WBC 10.7* 10.9* 10.2 9.2  NEUTROABS  --   --  6.7  --   HGB 9.5* 10.0* 9.6* 9.4*  HCT 30.6* 33.5* 32.5* 32.3*  MCV 90.5 93.6 93.9 96.4  PLT 150 183 169 182   Basic Metabolic Panel: Recent Labs  Lab 04/08/19 0357  04/10/19 0518 04/11/19 0610 04/13/19 0517 04/13/19 1848 04/14/19 0725 04/14/19 1618 04/14/19 1806  NA 147*   < > 142 139 144  --  143  --  140  K 4.8   < > 4.5 4.8 6.0* 5.8* 5.7* 6.7* 6.3*  CL 96*   < > 97* 96* 106  --  103  --  103  CO2 33*   < > --  26  --  24  GLUCOSE 103*   < > 120* 104* 95  --  94  --  85  BUN 127*   < > 104* 100* 86*  --  80*  --  78*  CREATININE 7.04*   < > 5.78* 5.13* 4.34*  --  3.94*  --  4.05*  CALCIUM 8.1*   < > 8.2* 8.1* 8.5*  --  9.3  --  9.3  MG 1.5*  --   --   --   --   --   --   --   --   PHOS  --   --   --   --  5.4*  --  5.4*  --   --    < > = values in this interval not displayed.   GFR: Estimated Creatinine Clearance: 17 mL/min (A) (by C-G formula based on SCr of 4.05 mg/dL (H)). Liver Function Tests: Recent Labs  Lab 04/11/19 0610 04/13/19 0517 04/14/19 0725  AST 144*  --   --   ALT 75*  --   --   ALKPHOS 94  --   --   BILITOT 0.5  --   --   PROT 6.9  --   --   ALBUMIN 2.7* 2.6* 2.8*   No results for  input(s): LIPASE, AMYLASE in the last 168 hours. No results for input(s): AMMONIA in the last 168 hours. Coagulation Profile: No results for input(s): INR, PROTIME in the last 168 hours. Cardiac Enzymes: No results for input(s): CKTOTAL, CKMB, CKMBINDEX, TROPONINI in the last 168 hours. BNP (last 3 results) No results for input(s): PROBNP in the last 8760 hours. HbA1C: No results for input(s): HGBA1C in the last 72 hours. CBG: No results for input(s): GLUCAP  in the last 168 hours. Lipid Profile: No results for input(s): CHOL, HDL, LDLCALC, TRIG, CHOLHDL, LDLDIRECT in the last 72 hours. Thyroid Function Tests: No results for input(s): TSH, T4TOTAL, FREET4, T3FREE, THYROIDAB in the last 72 hours. Anemia Panel: No results for input(s): VITAMINB12, FOLATE, FERRITIN, TIBC, IRON, RETICCTPCT in the last 72 hours. Urine analysis:    Component Value Date/Time   COLORURINE YELLOW 04/05/2019 1830   APPEARANCEUR CLEAR 04/05/2019 1830   LABSPEC 1.011 04/05/2019 1830   PHURINE 5.0 04/05/2019 1830   GLUCOSEU NEGATIVE 04/05/2019 1830   HGBUR MODERATE (A) 04/05/2019 1830   BILIRUBINUR NEGATIVE 04/05/2019 1830   KETONESUR NEGATIVE 04/05/2019 1830   PROTEINUR NEGATIVE 04/05/2019 1830   UROBILINOGEN 0.2 04/23/2010 1049   NITRITE NEGATIVE 04/05/2019 1830   LEUKOCYTESUR SMALL (A) 04/05/2019 1830   Sepsis Labs: @LABRCNTIP (procalcitonin:4,lacticidven:4) )No results found for this or any previous visit (from the past 240 hour(s)).   Radiological Exams on Admission: No results found.  EKG: Independently reviewed. Sinus rhythm with sinus arrhythmia, non-specific ST-T abnormalities are similar to prior.   Assessment/Plan   1. Hyperkalemia; acute kidney injury superimposed on CKD III  - Patient has hx of CKD III and was admitted on 04/05/19 with BUN 158 and SCr 10.14, found to have bilateral hydro d/t bladder outlet obstruction, Foley was placed, nephrology is following, and SCr has improved to 4,  but he has developed hyperkalemia  - Kayexelate enema and insulin with dextrose administered at time of admission  - Continue cardiac monitoring, follow serial chem panels q8h for now, restrict dietary potassium    2. CVA  - Admitted 04/05/19 with acute aphasia and right-sided weakness, found to have acute ischemic CVA  - No atrial fibrillation on telemetry monitoring during admission and no thrombus on echo; loop recorder was implanted   - He has expressive aphasia, dysphagia, and right-sided hemiplegia  - Continue ASA, Plavix, statin  - Continue PT/OT/SLP evals and tx   3. CAD - No anginal complaints  - Continue statin, beta-blocker, and antiplatelets    4. Multifocal atrial tachycardia  - Continue metoprolol   5. Hypertension  - BP at goal  - Continue metoprolol   6. Anemia  - Hematuria has resolved, no active bleeding, H&H appear stable    7. OSA  - Continue CPAP qHS     PPE: Mask, face shield  DVT prophylaxis: sq heparin  Code Status: Full  Family Communication: Discussed with patient  Consults called: Nephrology  Admission status: Observation     Briscoe Deutscherimothy S , MD [facility] Pager [phone]  If 7PM-7AM, please contact night-coverage [email or web address] Password [email or web address]  04/14/2019, 9:42 PM

## 2019-04-14 NOTE — Progress Notes (Signed)
[place] PHYSICAL MEDICINE & REHABILITATION PROGRESS NOTE   Subjective/Complaints:  Appreciate renal note, reviewed labs  ROS- unable due to aphasia  Objective:   No results found. Recent Labs    04/13/19 0517  WBC 9.2  HGB 9.4*  HCT 32.3*  PLT 182   Recent Labs    04/13/19 0517 04/13/19 1848 04/14/19 0725  NA 144  --  143  K 6.0* 5.8* 5.7*  CL 106  --  103  CO2 26  --  26  GLUCOSE 95  --  94  BUN 86*  --  80*  CREATININE 4.34*  --  3.94*  CALCIUM 8.5*  --  9.3    Intake/Output Summary (Last 24 hours) at 04/14/2019 0803 Last data filed at 04/14/2019 0518 Gross per 24 hour  Intake 480 ml  Output 2350 ml  Net -1870 ml     Physical Exam: Vital Signs Blood pressure (!) 130/98, pulse 99, temperature 97.6 F (36.4 C), temperature source Oral, resp. rate 17, height 5\' 4"  (1.626 m), weight 86.2 kg, SpO2 98 %.   General: No acute distress Mood and affect are appropriate Heart: irreg rate and rhythm no rubs murmurs or extra sounds Lungs: Clear to auscultation, breathing unlabored, no rales or wheezes Abdomen: Positive bowel sounds, soft nontender to palpation, mildly distended Extremities: No clubbing, cyanosis, or edema Skin: No evidence of breakdown, no evidence of rash Neurologic: Cranial nerves II through XII intact, motor strength is 5/5 in left  deltoid, bicep, tricep, grip, hip flexor, knee extensors, ankle dorsiflexor and plantar flexor Sensory exam normal sensation to light touch and proprioception in bilateral upper and lower extremities 2- RIght biceps and triceps, 0/5 delt and grip 3- R HF, KE, ADF Increased Right hemstring tone, neg flexor withdrawl Musculoskeletal: Full range of motion in all 4 extremities. No joint swelling   Assessment/Plan: 1. Functional deficits secondary to Left insular and posterior frontal infarct which require 3+ hours per day of interdisciplinary therapy in a comprehensive inpatient rehab setting.  Physiatrist is  providing close team supervision and 24 hour management of active medical problems listed below.  Physiatrist and rehab team continue to assess barriers to discharge/monitor patient progress toward functional and medical goals  Care Tool:  Bathing    Body parts bathed by patient: Face   Body parts bathed by helper: Front perineal area, Buttocks, Left lower leg, Right upper leg, Left arm     Bathing assist Assist Level: Supervision/Verbal cueing(wash face only)     Upper Body Dressing/Undressing Upper body dressing   What is the patient wearing?: Pull over shirt    Upper body assist Assist Level: Moderate Assistance - Patient 50 - 74%    Lower Body Dressing/Undressing Lower body dressing      What is the patient wearing?: Pants     Lower body assist Assist for lower body dressing: Maximal Assistance - Patient 25 - 49%     Toileting Toileting    Toileting assist Assist for toileting: 2 Helpers     Transfers Chair/bed transfer  Transfers assist  Chair/bed transfer activity did not occur: Safety/medical concerns  Chair/bed transfer assist level: Maximal Assistance - Patient 25 - 49%(lateral scoot/squat pivot)     Locomotion Ambulation   Ambulation assist   Ambulation activity did not occur: Safety/medical concerns          Walk 10 feet activity   Assist  Walk 10 feet activity did not occur: Safety/medical concerns  Walk 50 feet activity   Assist Walk 50 feet with 2 turns activity did not occur: Safety/medical concerns         Walk 150 feet activity   Assist Walk 150 feet activity did not occur: Safety/medical concerns         Walk 10 feet on uneven surface  activity   Assist Walk 10 feet on uneven surfaces activity did not occur: Safety/medical concerns         Wheelchair     Assist Will patient use wheelchair at discharge?: Yes Type of Wheelchair: Manual    Wheelchair assist level: Dependent - Patient 0% Max  wheelchair distance: 150 ft    Wheelchair 50 feet with 2 turns activity    Assist        Assist Level: Dependent - Patient 0%   Wheelchair 150 feet activity     Assist     Assist Level: Dependent - Patient 0%    Medical Problem List and Plan: 1.Right hemiparesis and aphasia/dysphagiasecondary to left insular and posterior frontal infarct embolic secondary to non-STEMI.status post loop recorder Continue CIR level PT OT 2. Antithrombotics: -DVT/anticoagulation:SCDs -antiplatelet therapy: aspirin 325 mg daily and add clopidigrel-  hematuria improved after Foley has been secured 3. Pain Management:Tylenol as needed 4. Mood:Provide emotional support -antipsychotic agents: N/A 5. Neuropsych: This patientis notcapable of making decisions on hisown behalf. 6. Skin/Wound Care:Routine skin checks -local care to loop recorder site as needed 7. Fluids/Electrolytes/Nutrition:Routine in and out's BUN trending down, IV fluids continuous 75 cc/h-encourage PO 8. Dysphagia. Dysphagia #1Honeythick liquids. Monitor hydration. Speech therapy follow-up 9. Multifocal atrial tachycardia/hypertension. Lopressor 25 mg 3 times a day, Procardia 30 mg daily. Follow-up cardiology services 10. 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 Follow-up urology as well as nephrology services.FOLEY TUBE REMAINS IN PLACE UNTIL FOLLOW UPASOUT PATIENT WITH UROLOGY, urine output close to 2000 mL on 04/12/2019 BUN /Creat slowly trending down 11. Hyperlipidemia. Lipitor 12. Morbid obesity/OSA. Continue CPAP    LOS: 4 days A FACE TO FACE EVALUATION WAS PERFORMED  Erick Colace 04/14/2019, 8:03 AM

## 2019-04-14 NOTE — Significant Event (Signed)
CRITICAL VALUE ALERT  Critical Value:  K 6.7  Date & Time Notied:  04/14/2019 1705  Provider Notified: Dr. Arrie Aran  Orders Received/Actions taken: He will evaluate and enter new orders.   Dani Gobble, RN

## 2019-04-14 NOTE — Progress Notes (Signed)
Notified by Nursing that pt has had elevated K with poor responsiveness.  EKG reviewed from 4/21 the newest is not in the chart , no change in vitals.  Repeat labs indicate that KCL dropped from 6.7  To 6.3 .  Discussed with Dr Abel Presto from nephrology.  Given poor response to current measures would recommend transfer to telemetry bed. [facility] contacted.  Would anticipate transfer back to rehab once stabilized

## 2019-04-14 NOTE — Progress Notes (Addendum)
Spoke with patients primary contact, [name], to let her know of the plan to transfer to acute.  She is appreciative for all we are doing.  Attempted to call son, Jareb, with no answer.  Dani Gobble, RN

## 2019-04-14 NOTE — Progress Notes (Signed)
Resting most of night w/o complaints or acute distress. Refuse C-pap will wear O2- sats 98%, continue to monitor and assist.

## 2019-04-14 NOTE — Significant Event (Signed)
At approximately 2:15 pm, an uninvited visitor entered this patients room and was noted to be taking pictures of him with her cell phone by staff.  When staff approached her, she attempted to hurry off the unit but was stopped and questioned about who she was.  She said she was the patients wife and that she did not know that the visitor restrictions were still in place.  She was notified that no visitors are allowed due to COVID-19.  Visitor left down the west elevators (#14).  RN and therapist went downstairs shortly after she got on elevator to ensure that she had left.  We were unable to locate her.  Asked patient if he knew who that person was.  He has expressive aphasia but nodded yes that he knew her.  He also nodded yes when asked if it was his wife.  He seemed tearful after her visit.  Notified security of events as we are unsure how she was able to enter the campus.  Nursing supervisor, Social worker, and this nurse called the patients son to try and identify the person who came up here.  He said it was NOT his wife, as the patient and his wife are divorced, and that the visitor was likely his girlfriend, Vonnie.  Decision was made by the team to close the unit doors for the safety of our patients.  Dani Gobble, RN

## 2019-04-14 NOTE — Progress Notes (Signed)
Notified of hyperkalemia of 6.7.  Repeat 6.3.  Unclear why his K is rising and his Cr has been improving.  Will treat with lokelma, lasix IV, and insulin/D50.  After speaking with Whitney, RN rapid response would like to transfer pt to telemetry to administer the insulin.  Not able to take lokelma due to AMS (has dysphagia).  Will try kayexalate enema instead.  EKG without peaked t waves per Dr. Wynn Banker.

## 2019-04-14 NOTE — Progress Notes (Signed)
Pt arrived to unit transferred from 4W. Pt accompanied by RRT. Pt alert. Orientation difficult to assess due to expressive aphasia. VSS. Skin assessment complete and in chart. Tele monitor placed. Bed alarm placed and call light in reach.  Will continue to monitor and treat per MD orders.

## 2019-04-14 NOTE — Progress Notes (Signed)
Occupational Therapy Session Note  Patient Details  Name: Gregory [name] Service MRN: [id number] Date of Birth: 05/01/1951  Today's Date: 04/14/2019 OT Individual Time: 1300-1418 OT Individual Time Calculation (min): 78 min    Short Term Goals: Week 1:  OT Short Term Goal 1 (Week 1): Pt will complete LB dressing with max assist of one caregiver OT Short Term Goal 2 (Week 1): Pt will complete bathing with mod assist to demonstrate increased initiation and sequencing OT Short Term Goal 3 (Week 1): Pt will complete toilet transfer with max assist of one caregiver OT Short Term Goal 4 (Week 1): Pt will complete UB dressing with mod assist  Skilled Therapeutic Interventions/Progress Updates:    Pt in bed to start session with O2 in place.  Sats at 100% on 2.5 Ls when tested,  Noted slight labored breathing however but does not appear fatigued.  Worked on shower and dressing during session.  Pt transferred from supine to sit EOB with mod assist.  Antony Salmon was then utilized for transfer from the EOB to the shower bench with total assist.  Pt unable to achieve full upright standing in the [place] and maintains flexed trunk.  He completed bathing with overall mod assist and max instructional cueing to sequence.  Noted motor planning deficits when attempting to control and manipulate hand held shower with the LUE.  Max hand over hand assistance for RUE use with increased flexor tone noted in the biceps and finger flexors.  Once bathing was complete, he was then transferred out to the wheelchair at the sink for dressing.  Mod demonstrational cueing with mod assist to donn pullover shirt.  Max assist for threading all garments over his legs secondary to decreased flexibility.  He needed total assist +2 (pt 30%) for standing to pull them over his hips.  Finished session with total assist for donning gripper socks.  Pt left up in the wheelchair at bed side with call button and phone in reach and RUE supported on half lap  tray.  Safety alarm belt in place as well.  Pt still with significant expressive deficits still present.    Therapy Documentation Precautions:  Precautions Precautions: Fall Precaution Comments: global aphasia, right hemiparesis Restrictions Weight Bearing Restrictions: No   Pain: Pain Assessment Pain Scale: Faces Pain Score: 0-No pain ADL:  Therapy/Group: Individual Therapy  Josephyne Tarter OTR/L 04/14/2019, 3:15 PM

## 2019-04-14 NOTE — Progress Notes (Signed)
RN called with K+ resulting at 6.3.  MD ordered D50, IV insulin, and kayexalate enema and transfer to tele for EKG monitoring.  Per MD okay to wait until pt transfers to give meds.  4W RN to call RR RN when bed available for moving assistance.

## 2019-04-14 NOTE — Progress Notes (Signed)
Physical Therapy Session Note  Patient Details  Name: Gregory [name] MRN: [id number] Date of Birth: [birth year]  Today's Date: 04/14/2019 PT Individual Time: 0905-1000 PT Individual Time Calculation (min): 55 min   Short Term Goals: Week 1:  PT Short Term Goal 1 (Week 1): Pt will perform bed<>chair transfer with mod assist PT Short Term Goal 2 (Week 1): Pt will initiate gait training PT Short Term Goal 3 (Week 1): Pt will perform bed mobility with min assist  Skilled Therapeutic Interventions/Progress Updates:    Pt received supine in bed and agreeable to therapy session. Pt noted to have labored breathing throughout session at rest and with activity; however, SpO2 monitored and maintaining >93% on room air throughout. Pt performed supine to sit, HOB elevated and using bedrails, with mod assist for trunk upright. Pt performed R lateral scoot/squat pivot transfer EOB to w/c with max assist of 2 for lifting/pivoting hips and for trunk control. Pt transported to/from gym in w/c. Pt performed 2x sit<>stand in standing frame with total assist for lifting - cuing throughout for anterior trunk weight shift transitioned to trunk/hip/knee extension to achieve upright stance - in standing pt demonstrated anterior trunk lean with lack of hip extension and sustained R knee flexion- manual facilitation, mirror feedback, and multimodal cuing for correction with minimal R gluteal and quadriceps activation to achieve R LE extension - pt able to minimally correct and perform trunk extension. Pt tolerated standing 2x for ~20-30seconds each in standing frame but limited by R knee pain while standing. Therapist noted R knee to be red around patella and pt reported tenderness to the touch. Pt performed lateral scoot/squat pivot transfer w/c to EOM with max assist of 2 for lifting/pivoting hips. Pt participated in lateral scooting on EOM with extensive education and visual demonstration for head/hips relationship with  scooting. Performed R/L lateral scooting with max assist of 1 for increased anterior trunk lean, lifting/pivoting hips - pt demonstrated increased difficulty scooting L due to poor trunk control noted when leaning R. Performed sitting balance task of cross body reaching to the R with min assist to prevent R lateral trunk LOB. Performed R lateral scoot transfer EOM to w/c to EOB with max assist of 1 for trunk control and lifting/pivoting hips. Performed sit to supine with min assist for LE management. Pt attempted supine bridging with inability to clear hips from bed despite max cuing and manual facilitation. Pt left supine in bed with needs in reach and bed alarm on.   Therapy Documentation Precautions:  Precautions Precautions: Fall Precaution Comments: global aphasia, right hemiparesis Restrictions Weight Bearing Restrictions: No  Pain:   Reports R knee pain during standing in standing frame with redness noted around patella with pt reporting tenderness to touch - pt unable to elaborate further regarding pain level or type due to aphasia. PA, Dan, notified. Deferred further standing activities this session.   Therapy/Group: Individual Therapy  Ginny Forth, PT, DPT 04/14/2019, 7:46 AM

## 2019-04-14 NOTE — Discharge Summary (Signed)
Physician Discharge Summary  Patient ID: Gregory [name] MRN: [id number] DOB/AGE: [birth year] 68 y.o.  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 EXAM: MRI HEAD WITHOUT CONTRAST MRA HEAD WITHOUT CONTRAST MRA NECK WITHOUT CONTRAST TECHNIQUE: Multiplanar, multiecho pulse sequences of the brain and surrounding structures were obtained without intravenous contrast. Angiographic images of the Circle of Willis were obtained using MRA technique without intravenous contrast. Angiographic images of the neck were obtained using MRA technique without intravenous contrast. Carotid stenosis measurements (when applicable) are obtained utilizing NASCET criteria, using the distal internal carotid diameter as the denominator. COMPARISON:  Head CT same day.  MRI 09/03/2018. FINDINGS: MRI HEAD FINDINGS Brain: Diffusion imaging confirms acute infarction in the left insula and posterior frontal region. The area of involvement measures about 3.5 cm in size. No mass effect or visible hemorrhage. No hydrocephalus. Chronic small-vessel ischemic changes of the deep white matter seen elsewhere. Vascular: Major vessels at the base of the brain show flow. Skull and upper cervical spine: Negative Sinuses/Orbits: Clear/normal Other: None MRA HEAD FINDINGS Both internal carotid arteries are patent through the skull base and siphon regions with atherosclerotic narrowing and irregularity in the siphon  regions. Supraclinoid internal carotid arteries are widely patent. The anterior and middle cerebral vessels appear to show flow without visible large or medium vessel occlusion. Distal branch vessels do show some atherosclerotic irregularity. The left vertebral artery supplies the basilar. No antegrade flow seen in the right vertebral artery. The basilar artery shows atherosclerotic narrowing and irregularity diffusely. Posterior circulation branch vessels are patent, with both posterior cerebral arteries receiving most of there supply from the anterior circulation. There is long segment stenosis of the left P1 segment, but good flow seen distal to that. MRA NECK FINDINGS There is antegrade flow in both common carotid arteries. The study suffers from pronounced motion degradation. Antegrade flow is seen in both cervical internal carotid arteries, but accurate evaluation of the carotid bifurcations is not possible. There is antegrade flow in the left vertebral artery but none seen on the right. IMPRESSION: 3.5 cm region of acute infarction affecting the left insula and posterior frontal region. No evidence of gross hemorrhage. MR angiography of the neck markedly degraded by motion. Both internal carotid arteries show antegrade flow. No antegrade flow in the right vertebral artery. Intracranial MR angiography does not show any large or medium vessel occlusion. Widespread atherosclerotic irregularity of the more distal branch vessels. Electronically Signed   By: Gregory [name] M.D.   On: 04/05/2019 10:59   [name] Neck Wo Contrast  Result Date: 04/05/2019 CLINICAL DATA:  Acute presentation with right-sided weakness and speech disturbance. EXAM: MRI HEAD WITHOUT CONTRAST MRA HEAD WITHOUT CONTRAST MRA NECK WITHOUT CONTRAST TECHNIQUE: Multiplanar, multiecho pulse sequences of the brain and surrounding structures were obtained without intravenous contrast. Angiographic images of the Circle of Willis were obtained using  MRA technique without intravenous contrast. Angiographic images of the neck were obtained using MRA technique without intravenous contrast. Carotid stenosis measurements (when applicable) are obtained utilizing NASCET criteria, using the distal internal carotid diameter as the denominator. COMPARISON:  Head CT same day.  MRI 09/03/2018. FINDINGS: MRI HEAD FINDINGS Brain: Diffusion imaging confirms acute infarction in the left insula and posterior frontal region. The area of involvement measures about 3.5 cm in size. No mass effect or visible hemorrhage. No hydrocephalus. Chronic small-vessel ischemic changes of the deep white matter seen elsewhere. Vascular: Major vessels at the base of the brain show flow. Skull and upper cervical spine: Negative Sinuses/Orbits: Clear/normal Other: None MRA HEAD FINDINGS Both internal carotid arteries are patent through the skull base and siphon regions with atherosclerotic narrowing and irregularity in the siphon regions. Supraclinoid internal carotid arteries are widely patent. The anterior and middle cerebral vessels appear to show flow without visible large or medium vessel occlusion. Distal branch vessels do show some atherosclerotic irregularity. The left vertebral artery supplies the basilar. No antegrade flow seen in the right vertebral artery. The basilar artery shows atherosclerotic narrowing and irregularity diffusely. Posterior circulation branch vessels are patent, with both posterior cerebral arteries receiving most of there supply from the anterior circulation. There is long segment stenosis of the left P1 segment, but good flow seen distal to that. MRA NECK FINDINGS There is antegrade flow in both common carotid arteries. The study suffers from pronounced motion degradation. Antegrade flow is seen in both cervical internal carotid arteries, but accurate evaluation of the carotid bifurcations is not possible. There is antegrade flow in the left vertebral artery but  none seen on the right. IMPRESSION: 3.5 cm region of acute infarction affecting the left insula and posterior frontal region. No evidence of gross hemorrhage. MR angiography of the neck markedly degraded by motion. Both internal carotid arteries show antegrade flow. No antegrade flow in the right vertebral artery. Intracranial MR angiography does not show any large or medium vessel occlusion. Widespread atherosclerotic irregularity of the more distal branch vessels. Electronically Signed   By: Gregory [name] M.D.   On: 04/05/2019 10:59   Mr Brain Wo Contrast  Result Date: 04/05/2019 CLINICAL DATA:  Acute presentation with right-sided weakness and speech disturbance. EXAM: MRI HEAD WITHOUT CONTRAST MRA HEAD WITHOUT CONTRAST MRA NECK WITHOUT CONTRAST TECHNIQUE: Multiplanar, multiecho pulse sequences of the brain and surrounding structures were obtained without intravenous contrast. Angiographic images of the Circle of Willis were obtained using MRA technique without intravenous contrast. Angiographic images of the neck were obtained using MRA technique without intravenous contrast. Carotid stenosis measurements (when applicable) are obtained utilizing NASCET criteria, using the distal internal carotid diameter as the denominator. COMPARISON:  Head CT same day.  MRI 09/03/2018. FINDINGS: MRI HEAD FINDINGS Brain: Diffusion imaging confirms acute infarction in the left insula and posterior frontal region. The area of involvement measures about 3.5 cm in size. No mass effect or visible hemorrhage. No hydrocephalus. Chronic small-vessel ischemic changes of the deep white matter seen elsewhere. Vascular: Major vessels at the base of the brain show flow. Skull and upper cervical spine: Negative Sinuses/Orbits: Clear/normal Other: None MRA HEAD FINDINGS Both internal carotid arteries are patent through the skull base and siphon regions with atherosclerotic narrowing and irregularity in the siphon regions. Supraclinoid  internal carotid arteries are widely patent. The anterior and middle cerebral vessels appear to show flow without visible large or medium vessel occlusion. Distal branch vessels do show some atherosclerotic irregularity. The left vertebral artery supplies the basilar. No antegrade flow seen in the right vertebral artery. The basilar artery shows atherosclerotic narrowing and irregularity diffusely. Posterior circulation branch vessels are patent, with both posterior cerebral arteries receiving most of there  supply from the anterior circulation. There is long segment stenosis of the left P1 segment, but good flow seen distal to that. MRA NECK FINDINGS There is antegrade flow in both common carotid arteries. The study suffers from pronounced motion degradation. Antegrade flow is seen in both cervical internal carotid arteries, but accurate evaluation of the carotid bifurcations is not possible. There is antegrade flow in the left vertebral artery but none seen on the right. IMPRESSION: 3.5 cm region of acute infarction affecting the left insula and posterior frontal region. No evidence of gross hemorrhage. MR angiography of the neck markedly degraded by motion. Both internal carotid arteries show antegrade flow. No antegrade flow in the right vertebral artery. Intracranial MR angiography does not show any large or medium vessel occlusion. Widespread atherosclerotic irregularity of the more distal branch vessels. Electronically Signed   By: Gregory [name] M.D.   On: 04/05/2019 10:59   US Renal  Result Date: 04/05/2019 CLINICAL DATA:  68 year old male with hypertension EXAM: RENAL / URINARY TRACT ULTRASOUND COMPLETE COMPARISON:  None. FINDINGS: Right Kidney: Length: 11.1 cm x 5.3 cm x 6.1 cm, 189 cc. Hydronephrosis. Flow confirmed in the hilum of the right kidney. Left Kidney: Length: 10.9 cm x 5.6 cm x 5.9 cm, 187 cc. Hydronephrosis. Flow confirmed in the hilum of the left kidney. Anechoic cystic structure on the  inferior cortex measures 2.6 cm x 1.7 cm with through transmission and no internal color flow. Additional cystic lesion measures 1.1 cm x 1.1 cm x 1.0 cm, with through transmission and no internal flow. Bladder: Catheter in place within the bladder. IMPRESSION: Bilateral hydronephrosis. If there is concern for distal obstruction, CT may be indicated. Left-sided renal cysts. Electronically Signed   By: Gilmer Mor D.O.   On: 04/05/2019 15:39   Dg Chest Port 1 View  Result Date: 04/05/2019 CLINICAL DATA:  Altered behavior. EXAM: PORTABLE CHEST 1 VIEW COMPARISON:  None. FINDINGS: Lungs are adequately inflated and otherwise clear. Borderline cardiomegaly. Mediastinum is within normal. Fusion hardware over the lower cervical spine intact. Minimal degenerative change of the spine. IMPRESSION: No acute findings. Electronically Signed   By: Elberta Fortis M.D.   On: 04/05/2019 11:32   Dg Swallowing Func-speech Pathology  Result Date: 04/07/2019 Objective Swallowing Evaluation: Type of Study: MBS-Modified Barium Swallow Study  Patient Details Name: Gregory [name] MRN: [id number] Date of Birth: [birth year] Today's Date: 04/07/2019 Time: SLP Start Time (ACUTE ONLY): 1140 -SLP Stop Time (ACUTE ONLY): 1200 SLP Time Calculation (min) (ACUTE ONLY): 20 min Past Medical History: Past Medical History: Diagnosis Date . AKI (acute kidney injury) (HCC)  . CKD (chronic kidney disease) stage 2, GFR 60-89 ml/min  . CVA (cerebral vascular accident) (HCC)  . Hyperlipidemia  . Hypertension  . PVD (peripheral vascular disease) (HCC)  Past Surgical History: Past Surgical History: Procedure Laterality Date . AORTIC ARCH ANGIOGRAPHY N/A 09/09/2018  Procedure: AORTIC ARCH ANGIOGRAPHY;  Surgeon: Sherren Kerns, MD;  Location: [facility];  Service: Cardiovascular;  Laterality: N/A; . CAROTID ANGIOGRAPHY Bilateral 09/09/2018  Procedure: CAROTID ANGIOGRAPHY;  Surgeon: Sherren Kerns, MD;  Location: [facility];  Service:  Cardiovascular;  Laterality: Bilateral; HPI: Pt is a 68 y.o. male with medical history significant of chronic kidney disease stage III, hypertension, hyperlipidemia, who presented to the hospital with chief complaint of sudden onset right-sided weakness and inability to talk.  MRI of the brain revealed 3.5 cm region of acute infarction affecting the left insula and posterior frontal  region.  No data recorded Assessment / Plan / Recommendation CHL IP CLINICAL IMPRESSIONS 04/07/2019 Clinical Impression Pt presents with a severe oral dysphagia with CN VII and XII weakness impacting labial seal and lingual manipulation of the bolus. There is severe anterior spillage and pooling the right buccal cavity. Pt needs increased time and posterior head tilt to gather and orally transit boluses via slight pumping and passive spillage.  Palatal elevation is weak, does not occur until swallow triggered. There is a plate on the cervical spine starting at C3 causing protrusion of the posterior pharyngeal wall and deflecting delayed nectar liquids into the vestibule. Due to pts body habitus it is very difficult to see the glottis and subglottis. Penetration is visible, strongly suspect airway protection, but cannot confirm. No cough was elicited, but pt could attempt a throat clear. Best textures are honey thick liquids via spoon and puree. Oral suction, second swallows and intermittent throat clearing will be beneficial to clear oral and base of tongue residue post swallow. Hope for increasing automaticity and strength with diet. Meds will need to be crushed.   SLP Visit Diagnosis Dysphagia, oropharyngeal phase (R13.12) Attention and concentration deficit following -- Frontal lobe and executive function deficit following -- Impact on safety and function Severe aspiration risk   CHL IP TREATMENT RECOMMENDATION 04/07/2019 Treatment Recommendations Therapy as outlined in treatment plan below   No flowsheet data found. CHL IP DIET  RECOMMENDATION 04/07/2019 SLP Diet Recommendations Dysphagia 1 (Puree) solids Liquid Administration via Spoon Medication Administration Crushed with puree Compensations Slow rate;Small sips/bites;Monitor for anterior loss;Lingual sweep for clearance of pocketing;Multiple dry swallows after each bite/sip;Clear throat intermittently Postural Changes Seated upright at 90 degrees   CHL IP OTHER RECOMMENDATIONS 04/07/2019 Recommended Consults -- Oral Care Recommendations -- Other Recommendations Have oral suction available   CHL IP FOLLOW UP RECOMMENDATIONS 04/07/2019 Follow up Recommendations Inpatient Rehab   CHL IP FREQUENCY AND DURATION 04/07/2019 Speech Therapy Frequency (ACUTE ONLY) min 2x/week Treatment Duration 2 weeks      CHL IP ORAL PHASE 04/07/2019 Oral Phase Impaired Oral - Pudding Teaspoon -- Oral - Pudding Cup -- Oral - Honey Teaspoon Decreased bolus cohesion;Premature spillage;Delayed oral transit;Lingual/palatal residue;Right pocketing in lateral sulci;Reduced posterior propulsion;Lingual pumping;Weak lingual manipulation;Right anterior bolus loss Oral - Honey Cup -- Oral - Nectar Teaspoon Decreased bolus cohesion;Premature spillage;Delayed oral transit;Lingual/palatal residue;Right pocketing in lateral sulci;Reduced posterior propulsion;Lingual pumping;Weak lingual manipulation;Right anterior bolus loss Oral - Nectar Cup Decreased bolus cohesion;Premature spillage;Delayed oral transit;Lingual/palatal residue;Right pocketing in lateral sulci;Reduced posterior propulsion;Lingual pumping;Weak lingual manipulation;Right anterior bolus loss Oral - Nectar Straw -- Oral - Thin Teaspoon -- Oral - Thin Cup -- Oral - Thin Straw -- Oral - Puree Decreased bolus cohesion;Premature spillage;Delayed oral transit;Lingual/palatal residue;Right pocketing in lateral sulci;Reduced posterior propulsion;Lingual pumping;Weak lingual manipulation;Right anterior bolus loss Oral - Mech Soft -- Oral - Regular -- Oral -  Multi-Consistency -- Oral - Pill -- Oral Phase - Comment --  CHL IP PHARYNGEAL PHASE 04/07/2019 Pharyngeal Phase Impaired Pharyngeal- Pudding Teaspoon -- Pharyngeal -- Pharyngeal- Pudding Cup -- Pharyngeal -- Pharyngeal- Honey Teaspoon Delayed swallow initiation-vallecula;Pharyngeal residue - valleculae Pharyngeal -- Pharyngeal- Honey Cup -- Pharyngeal -- Pharyngeal- Nectar Teaspoon Delayed swallow initiation-pyriform sinuses;Penetration/Aspiration before swallow;Penetration/Aspiration during swallow;Penetration/Apiration after swallow;Pharyngeal residue - valleculae Pharyngeal Material enters airway, remains ABOVE vocal cords and not ejected out;Material enters airway, remains ABOVE vocal cords then ejected out Pharyngeal- Nectar Cup Delayed swallow initiation-pyriform sinuses;Penetration/Aspiration before swallow;Penetration/Aspiration during swallow;Penetration/Apiration after swallow;Pharyngeal residue - valleculae Pharyngeal Material enters airway, remains ABOVE  vocal cords and not ejected out Pharyngeal- Nectar Straw -- Pharyngeal -- Pharyngeal- Thin Teaspoon -- Pharyngeal -- Pharyngeal- Thin Cup -- Pharyngeal -- Pharyngeal- Thin Straw -- Pharyngeal -- Pharyngeal- Puree Pharyngeal residue - valleculae;Delayed swallow initiation-vallecula Pharyngeal Material does not enter airway Pharyngeal- Mechanical Soft -- Pharyngeal -- Pharyngeal- Regular -- Pharyngeal -- Pharyngeal- Multi-consistency -- Pharyngeal -- Pharyngeal- Pill -- Pharyngeal -- Pharyngeal Comment --  No flowsheet data found. DeBlo[place], Riley NearingBonnie Caroline 04/07/2019, 1:22 PM              Ct Renal Stone Study  Result Date: 04/06/2019 CLINICAL DATA:  Hydronephrosis. EXAM: CT ABDOMEN AND PELVIS WITHOUT CONTRAST TECHNIQUE: Multidetector CT imaging of the abdomen and pelvis was performed following the standard protocol without IV contrast. COMPARISON:  Ultrasound of April 05, 2019. FINDINGS: Lower chest: No acute abnormality. Hepatobiliary: No focal liver  abnormality is seen. No gallstones, gallbladder wall thickening, or biliary dilatation. Pancreas: Unremarkable. No pancreatic ductal dilatation or surrounding inflammatory changes. Spleen: Normal in size without focal abnormality. Adrenals/Urinary Tract: Adrenal glands appear normal. Moderate bilateral hydroureteronephrosis is noted without evidence of renal or ureteral calculi. Catheter is noted in urinary bladder which appears to be decompressed. However, there appears to be significant diffuse wall thickening of urinary bladder which may be due to lack of distension, but is concerning for cystitis. Clinical correlation is recommended. Stomach/Bowel: Stomach is within normal limits. Appendix appears normal. No evidence of bowel wall thickening, distention, or inflammatory changes. Vascular/Lymphatic: Aortic atherosclerosis. No enlarged abdominal or pelvic lymph nodes. Reproductive: Prostate is unremarkable. Other: No abdominal wall hernia or abnormality. No abdominopelvic ascites. Musculoskeletal: No acute or significant osseous findings. IMPRESSION: Moderate bilateral hydroureteronephrosis is noted without evidence of obstructing calculus. Diffuse wall thickening of urinary bladder is noted which may be due to lack of distension, but is concerning for cystitis. Clinical correlation is recommended. Catheter is noted within urinary bladder. Aortic Atherosclerosis (ICD10-I70.0). Electronically Signed   By: Lupita RaiderJames  Green Jr M.D.   On: 04/06/2019 09:24   Ct Head Code Stroke Wo Contrast  Result Date: 04/05/2019 CLINICAL DATA:  Code stroke. Right-sided weakness. Speech disturbance. Last seen normal 0730 hours. EXAM: CT HEAD WITHOUT CONTRAST TECHNIQUE: Contiguous axial images were obtained from the base of the skull through the vertex without intravenous contrast. COMPARISON:  09/03/2018 FINDINGS: Brain: No CT evidence of acute infarction. Chronic small-vessel ischemic changes of the cerebral hemispheric white matter.  No mass lesion, hemorrhage, hydrocephalus or extra-axial collection. Vascular: No definite hyperdense vessel. One could question slight hyperdensity in the left M1 segment. Skull: Negative Sinuses/Orbits: Clear/normal Other: None ASPECTS ([place] Stroke Program Early CT Score) - Ganglionic level infarction (caudate, lentiform nuclei, internal capsule, insula, M1-M3 cortex): 6 - Supraganglionic infarction (M4-M6 cortex): 3 Total score (0-10 with 10 being normal): 9 IMPRESSION: 1. Possible early gray-white differentiation loss in the insula on the left. Chronic small-vessel ischemic changes. 2. One could question slight hyperdensity in the left M1 segment. This is not definite. 3. ASPECTS is 9 4. These results were communicated to Dr. Amada JupiterKirkpatrick at 10:01 amon 4/15/2020by text page via the [facility] messaging system. Electronically Signed   By: Gregory FusiMark  Shogry M.D.   On: 04/05/2019 10:04   [place] Carotid  Result Date: 04/06/2019 Carotid Arterial Duplex Study Indications:  CVA. Risk Factors: Hypertension, hyperlipidemia, prior CVA. Limitations:  Patient positioning, snoring, body habitus Performing Technologist: Blanch MediaMegan Riddle RVS  Examination Guidelines: A complete evaluation includes B-mode imaging, spectral Doppler, color Doppler, and power Doppler as needed of all  accessible portions of each vessel. Bilateral testing is considered an integral part of a complete examination. Limited examinations for reoccurring indications may be performed as noted.  Right Carotid Findings: +----------+--------+--------+--------+-----------+--------+           PSV cm/sEDV cm/sStenosisDescribe   Comments +----------+--------+--------+--------+-----------+--------+ CCA Prox  80      16              homogeneous         +----------+--------+--------+--------+-----------+--------+ CCA Distal164     24              homogeneous         +----------+--------+--------+--------+-----------+--------+ ICA Prox  188     34       1-39%   homogeneous         +----------+--------+--------+--------+-----------+--------+ ICA Distal112     22                                  +----------+--------+--------+--------+-----------+--------+ ECA       170     11                                  +----------+--------+--------+--------+-----------+--------+ +----------+--------+-------+--------+-------------------+           PSV cm/sEDV cmsDescribeArm Pressure (mmHG) +----------+--------+-------+--------+-------------------+ [id number]                                         +----------+--------+-------+--------+-------------------+ +---------+--------+--------+--------------+ VertebralPSV cm/sEDV cm/sNot identified +---------+--------+--------+--------------+  Left Carotid Findings: +----------+--------+--------+--------+-----------+--------+           PSV cm/sEDV cm/sStenosisDescribe   Comments +----------+--------+--------+--------+-----------+--------+ CCA Prox  181     26              homogeneous         +----------+--------+--------+--------+-----------+--------+ CCA Distal118     23              homogeneous         +----------+--------+--------+--------+-----------+--------+ ICA Prox  161     28      1-39%   homogeneous         +----------+--------+--------+--------+-----------+--------+ ICA Distal152     30                                  +----------+--------+--------+--------+-----------+--------+ ECA       170     12                                  +----------+--------+--------+--------+-----------+--------+ +----------+--------+--------+--------+-------------------+ SubclavianPSV cm/sEDV cm/sDescribeArm Pressure (mmHG) +----------+--------+--------+--------+-------------------+           121                                         +----------+--------+--------+--------+-------------------+ +---------+--------+--+--------+--+---------+ VertebralPSV  cm/s52EDV cm/s15Antegrade +---------+--------+--+--------+--+---------+    Preliminary     Labs:  Basic Metabolic Panel: Recent Labs  Lab 04/08/19 0357 04/09/19 0900 04/10/19 0518 04/11/19 0610 04/13/19 0517 04/13/19 1848 04/14/19 0725 04/14/19 1618 04/14/19 1806  NA 147* 142 142 139 144  --  143  --  140  K 4.8 4.3 4.5 4.8 6.0* 5.8* 5.7* 6.7* 6.3*  CL 96* 96* 97* 96* 106  --  103  --  103  CO2 33* 32 --  26  --  24  GLUCOSE 103* 154* 120* 104* 95  --  94  --  85  BUN 127* 117* 104* 100* 86*  --  80*  --  78*  CREATININE 7.04* 6.01* 5.78* 5.13* 4.34*  --  3.94*  --  4.05*  CALCIUM 8.1* 8.1* 8.2* 8.1* 8.5*  --  9.3  --  9.3  MG 1.5*  --   --   --   --   --   --   --   --   PHOS  --   --   --   --  5.4*  --  5.4*  --   --     CBC: Recent Labs  Lab 04/10/19 0518 04/11/19 0610 04/13/19 0517  WBC 10.9* 10.2 9.2  NEUTROABS  --  6.7  --   HGB 10.0* 9.6* 9.4*  HCT 33.5* 32.5* 32.3*  MCV 93.6 93.9 96.4  PLT 183 169 182   Family history .  History of diabetes as well as hypertension.  Denies cancer  CBG: No results for input(s): GLUCAP in the last 168 hours.  Brief HPI:    Gregory [name] is a 68 year old right-handed male history of chronic kidney disease stage III hypertension as well as hyperlipidemia.  Lives with spouse independent prior to admission.  Presented 04/05/2019 with acute onset of right-sided weakness and slurred speech.  Cranial CT scan showed possible early gray-white differentiation loss in the insula on the left.  Chronic small vessel ischemic changes.  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  No evidence of gross hemorrhage.  MRA negative patient did not receive TPA.  Noted findings of hyperkalemia 6.8 elevated creatinine 10.4 from baseline 1.70 troponin 3.29 EKG with ST changes.  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  Echocardiogram with ejection fraction of  60% normal systolic function.  Cardiology services consulted suspect non-STEMI medical management recommended.  Patient did undergo placement of loop recorder 04/07/2019 per Dr. Ladona Ridgel.  Renal ultrasound showed bilateral hydronephrosis with nephrology services consulted as well as urology.  A Foley tube was placed and would remain in place until follow-up outpatient.  Renal function stabilized 5.78.  Maintain on aspirin for CVA prophylaxis.  Dysphasia #1 honey thick liquid diet.  Patient was admitted for a comprehensive rehab program.  Hospital Course: Gregory [name] was admitted to rehab 04/10/2019 for inpatient therapies to consist of PT, ST and OT at least three hours five days a week. Past admission physiatrist, therapy team and rehab RN have worked together to provide customized collaborative inpatient rehab.  Patient was attending full therapies.  Remained on aspirin and Plavix for CVA prophylaxis.  Ritalin was added to patient's regimen to help attend to focus and participate with therapies.  Pain management with Tylenol.  Dysphasia #1 honey thick liquid diet.  Blood pressures monitored closely on Lopressor Procardia.  Follow cardiology services for multifocal atrial tachycardia.  Close observation by renal services as well as urology for obstructive uropathy a Foley catheter tube remained in place close monitoring of renal function.  On 04/14/2019 at approximately 7:20 PM noted findings of elevated potassium patient lethargic poorly responsive EKG unchanged discussed  with renal services given poor response to current measures recommended transfer to acute care services telemetry bed.  Physical exam.  Blood pressure 116/97 pulse 77 temperature 97.6 respirations 18 oxygen saturation 99% room air Constitutional.  Patient appears mildly short of breath Head.  Normocephalic and atraumatic Eyes pupils round and reactive to light EOMs normal Neck supple normal range of motion no thyromegaly Cardiovascular  normal rate and rhythm without gallop Respiratory effort normal limited inspiratory effort no wheezes noted patient with oxygen via nasal cannula Genitourinary Foley tube in place Neurological.  Patient alert aphasic provided some short phrases appropriately yes no head nods appropriate right upper extremity 0 out of 5 right lower extremity 1-2 out of 5  Rehab course: During patient's stay in rehab weekly team conferences were held to monitor patient's progress, set goals and discuss barriers to discharge. At admission, patient required max assist sit to stand, moderate assist supine to sit and sit to supine +2 for general transfers.  Max assist for upper and lower body bathing and dressing.  He  has had improvement in activity tolerance, balance, postural control as well as ability to compensate for deficits. He/She has had improvement in functional use RUE/LUE  and RLE/LLE as well as improvement in awareness.  Patient with slow progressive gains.  Patient needing multiple rest breaks.  Oxygen saturations remain 93% on room air with therapies.  Perform supine to sit head of bed elevated using bed rails moderate assist.  Perform 2 times sit to stand and standing frame with total assistance.  Patient tolerated standing 2 times for 20 to 30 seconds at a time.  Patient transferred from supine to sit edge of bed moderate assist unable to achieve full upright standing in the Steady and maintains flexed trunk       Disposition: Discharged to acute care services   Diet: Dysphagia #1 honey thick liquids  Special Instructions:  Medication changes as per medicine team.  Discharge Instructions    Ambulatory referral to Neurology   Complete by:  As directed    An appointment is requested in approximately follow-up 4-6 weeks left insular posterior frontal infarction      Follow-up Information    Kirsteins, Victorino Sparrow, MD Follow up.   Specialty:  Physical Medicine and Rehabilitation Why:  office to  call for appointment Contact information: [address] [place] [address] [phone]        Marinus Maw, MD Follow up.   Specialty:  Cardiology Why:  call for appointment Contact information: [address] [place] [address] [phone]        Zetta Bills, MD Follow up.   Specialty:  Nephrology Why:  call for appointment Contact information: [address] [place] [address] [phone]        Malen Gauze, MD Follow up.   Specialty:  Urology Why:  call for appointment Contact information: [address] [place] [address] [phone]        Carolynne Edouard, PA-C Follow up.   Specialty:  Physician Assistant Contact information: [address] [place] [address] [phone]           Signed: Charlton Amor 04/14/2019, 8:14 PM

## 2019-04-14 NOTE — Progress Notes (Signed)
Speech Language Pathology Daily Session Note  Patient Details  Name: Gregory [name] MRN: [id number] Date of Birth: Jan 21, 1951  Today's Date: 04/14/2019 SLP Individual Time: 1145-1230 SLP Individual Time Calculation (min): 45 min  Short Term Goals: Week 1: SLP Short Term Goal 1 (Week 1): Pt will tolerate current diet (puree/honey thick liquid via tsp) without overt s/s aspiration given Mod A verbal and visual cues for use of safe swallow strategies. SLP Short Term Goal 2 (Week 1): Pt will identify objects in a field of 3 with supervision A multmodal cues.  SLP Short Term Goal 2 - Progress (Week 1): Updated due to goal met SLP Short Term Goal 3 (Week 1): Pt will follow one step verbal directions with 90% accuracy given Mod I cues SLP Short Term Goal 3 - Progress (Week 1): Updated due to goal met SLP Short Term Goal 4 (Week 1): Pt will communicate basic wants/needs in response to yes/no questions Min A verbal and visual cues. SLP Short Term Goal 4 - Progress (Week 1): Discontinued (comment) SLP Short Term Goal 5 (Week 1): Pt will produce 3-5 vowel sounds and/or CV syllables with 50% accuracy given maxA verbal and visual cues. SLP Short Term Goal 6 (Week 1): Pt will demonstrate comprehension of phrase/sentence length written information.  Skilled Therapeutic Interventions:  Skilled treatment session focused on dysphagia. SLP facilitiated session by providing skilled observation of pt consuming dysphagia 2 snack with honey thick liquids via tsp. Pt required Max A multimodal cues for lingual manipulation of food and bolus cohesion. Each bolus took more than a reasonable amount of time to masticate and consume. Pt with no overt s/s of aspiration with honey thick liquids via tsp.      Pain Pain Assessment Pain Scale: Faces Faces Pain Scale: No hurt  Therapy/Group: Individual Therapy  Sheelah Ritacco 04/14/2019, 5:30 PM

## 2019-04-14 NOTE — Progress Notes (Signed)
Assisted with transfer to [phone].  Bedside RN aware of need for D50, IV insulin, and kayexalate enema. Please call RRT if further assistance needed.

## 2019-04-14 NOTE — Significant Event (Signed)
Notified by lab of critical value K 6.7.  Paged Dr. Arrie Aran.  Stat orders received.  Ordered to give insulin IV along with d50 and IV lasix.  Called rapid response nurse for assistance as we do not usually give those medications here. New IV obtained due to old iv being pulled out. Placed patient on phillips portable monitor.  IV lasix administered lokelma not given as patient is not awake enough to take in PO meds safely (already on D1 honey thick diet). Performed EKG.  Patient has been lethargic, but easily aroused to voice but then falling back asleep just as fast.  Notified attending Dr. Wynn Banker of events.  Dr. Wynn Banker and Dr. Arrie Aran have discussed patient status.  New labs obtained to verify potassium levels.  Awaiting values to decide upon next plan of care.  Dani Gobble, RN

## 2019-04-14 NOTE — Plan of Care (Signed)
  Problem: Consults Goal: RH STROKE PATIENT EDUCATION Description See Patient Education module for education specifics  Outcome: Progressing Goal: Nutrition Consult-if indicated Outcome: Progressing Goal: Diabetes Guidelines if Diabetic/Glucose > 140 Description If diabetic or lab glucose is > 140 mg/dl - Initiate Diabetes/Hyperglycemia Guidelines & Document Interventions  Outcome: Progressing   Problem: RH SKIN INTEGRITY Goal: RH STG SKIN FREE OF INFECTION/BREAKDOWN Description Skin free of infection/breakdown with min assistance  Outcome: Progressing Goal: RH STG MAINTAIN SKIN INTEGRITY WITH ASSISTANCE Description STG Maintain Skin Integrity With min Assistance.  Outcome: Progressing   Problem: RH SAFETY Goal: RH STG ADHERE TO SAFETY PRECAUTIONS W/ASSISTANCE/DEVICE Description STG Adhere to Safety Precautions With min Assistance/Device.  Outcome: Progressing Goal: RH STG DECREASED RISK OF FALL WITH ASSISTANCE Description STG Decreased Risk of Fall With min Assistance.  Outcome: Progressing   Problem: RH COGNITION-NURSING Goal: RH STG USES MEMORY AIDS/STRATEGIES W/ASSIST TO PROBLEM SOLVE Description STG Uses Memory Aids/Strategies With min Assistance to Problem Solve.  Outcome: Progressing Goal: RH STG ANTICIPATES NEEDS/CALLS FOR ASSIST W/ASSIST/CUES Description STG Anticipates Needs/Calls for Assist With min Assistance/Cues.  Outcome: Progressing   Problem: RH KNOWLEDGE DEFICIT Goal: RH STG INCREASE KNOWLEDGE OF DYSPHAGIA/FLUID INTAKE Description Increase knowledge of dysphagia/fluid intake with mod asssitance  Outcome: Progressing Goal: RH STG INCREASE KNOWLEDGE OF STROKE PROPHYLAXIS Description Patient able to expressing ways to prevent stroke with mod assistance  Outcome: Progressing   Problem: RH BLADDER ELIMINATION Goal: RH STG MANAGE BLADDER WITH ASSISTANCE Description STG Manage Bladder With mod Assistance  Outcome: Not Progressing Goal: RH STG MANAGE  BLADDER WITH MEDICATION WITH ASSISTANCE Description STG Manage Bladder With Medication With mod Assistance.  Outcome: Not Progressing Goal: RH STG MANAGE BLADDER WITH EQUIPMENT WITH ASSISTANCE Description STG Manage Bladder With Equipment With mod Assistance  Outcome: Not Progressing    Foley in place- nursing provides care

## 2019-04-14 NOTE — Significant Event (Signed)
Rapid Response Event Note Hyperkalemia  Overview: Time Called: 1749 Arrival Time: 1751  Initial Focused Assessment: Called by RN, had a new order for order for IV insulin and D50 for Potassium of 6.7 on last K+ lab.  Unsure of giving IV insulin.   Arrived to room, patient lethargic, arousable to voice, has expressive aphasia but able to nod appropriately to questions.   EKG done, NSR, no pronounced peak T waves noted.    RN paged Dr Wynn Banker, awaiting call back.  Interventions: -- New PIV placed by RN -- VS stable, patient on RA  -- IV Lasix given per order -- Placed patient on tele: SR, no ectopy noted -- Paged Dr Arrie Aran, who ordered Insulin/Dextrose, has reviewed EKG: will hold meds pending STAT Bmet, will defer to Dr Wynn Banker for transfer if needed.  -- Per Dr Wynn Banker: Will hold on tele transfer, pending STAT Bmet results for further treatment at this time.  Plan of Care (if not transferred): -- Pending Stat BMET -- Asked RN to call RRT back as needed if K+ same or greater    Gregory [name], [name]

## 2019-04-14 NOTE — Progress Notes (Signed)
Notified by CCMD of pt HR jumping up to 140s. Pt resting in bed. Denies chest pain. BP 161/125. Night coverage, [place] made aware.   STAT EKG performed and placed in chart. Will continue to monitor.

## 2019-04-14 NOTE — Progress Notes (Signed)
Report called to [place], RN on 5W.

## 2019-04-14 NOTE — Progress Notes (Signed)
Patient ID: Gregory [name], male   DOB: Jul 14, 1951, 68 y.o.   MRN: [id number] [facility] Progress Note   Assessment/ Plan:   #  Acute kidney injury on chronic kidney disease stage III:  With bladder outlet obstruction/obstructive uropathy.  It is highly likely that he has had this obstruction for >2 weeks and may have significant residual renal injury - leave indwelling Foley until seen by urology as outpatient - continue supportive care - slowly improving   # Hyperkalemia   - Improving but still high  - Repeated Lokelma today - Repeat potassium level now --low potassium diet restriction  # Hypernatremia: Limited access to free water due to dysphagia precautions.  Resolved.  # Hypertension: acceptable   # Anemia: Improving last assessment; Replete iron stores; with resolution of hematuria.  CBC in AM  # Elevated troponin levels: Trending down last eval  # Acute CVA with right-sided weakness/expressive aphasia: s/p speech eval and for rehab   Subjective:   He has been working with OT this afternoon.  He denies overt shortness of breath.  Expressive Aphasia but indicates that he had 1 or more BM's overnight after lokelma.  Review of systems:   Denies shortness of breath  Denies nausea or vomiting  Continues with foley   Objective:   BP (!) 130/98 (BP Location: Left Arm)   Pulse 99   Temp 97.6 F (36.4 C) (Oral)   Resp 17   Ht 5\' 4"  (1.626 m)   Wt 86.2 kg   SpO2 98%   BMI 32.62 kg/m   Intake/Output Summary (Last 24 hours) at 04/14/2019 1356 Last data filed at 04/14/2019 1252 Gross per 24 hour  Intake 240 ml  Output 2800 ml  Net -2560 ml   Weight change:   Physical Exam:  Gen: elderly male in bed in no acute distress  CVS: RRR; S1, S2 Resp: clear to auscultation bilaterally and normal work of breathing Abd: Soft, obese habitus, nontender Ext: no lower extremity edema   Aphasia; awake and interactive GU foley intact   Imaging: No results  found.  Labs: BMET Recent Labs  Lab 04/08/19 0357 04/09/19 0900 04/10/19 0518 04/11/19 0610 04/13/19 0517 04/13/19 1848 04/14/19 0725  NA 147* 142 142 139 144  --  143  K 4.8 4.3 4.5 4.8 6.0* 5.8* 5.7*  CL 96* 96* 97* 96* 106  --  103  CO2 33* 32 30 28 26   --  26  GLUCOSE 103* 154* 120* 104* 95  --  94  BUN 127* 117* 104* 100* 86*  --  80*  CREATININE 7.04* 6.01* 5.78* 5.13* 4.34*  --  3.94*  CALCIUM 8.1* 8.1* 8.2* 8.1* 8.5*  --  9.3  PHOS  --   --   --   --  5.4*  --  5.4*   CBC Recent Labs  Lab 04/08/19 0357 04/10/19 0518 04/11/19 0610 04/13/19 0517  WBC 10.7* 10.9* 10.2 9.2  NEUTROABS  --   --  6.7  --   HGB 9.5* 10.0* 9.6* 9.4*  HCT 30.6* 33.5* 32.5* 32.3*  MCV 90.5 93.6 93.9 96.4  PLT 150 183 169 182    Medications:    . aspirin  300 mg Rectal Daily   Or  . aspirin  325 mg Oral Daily  . atorvastatin  80 mg Oral q1800  . clopidogrel  75 mg Oral Daily  . methylphenidate  5 mg Oral BID WC  . metoprolol tartrate  25 mg  Oral BID   Estanislado EmmsLori C Foster 04/14/2019

## 2019-04-14 NOTE — Progress Notes (Signed)
Low urine K of only 10 likely etiology of hyperkalemia.  Given IV lasix and since couldn't take po lokelma, will use Kayexalate enema.  EKG is without peaked t waves and is hemodynamically stable but unfortunately had to be re-admitted to [facility] for telemetry monitoring for IV insulin/d50 for unclear reasons.

## 2019-04-15 ENCOUNTER — Inpatient Hospital Stay (HOSPITAL_COMMUNITY): Payer: Medicare HMO | Admitting: Physical Therapy

## 2019-04-15 ENCOUNTER — Inpatient Hospital Stay (HOSPITAL_COMMUNITY): Payer: Medicare HMO | Admitting: Speech Pathology

## 2019-04-15 ENCOUNTER — Inpatient Hospital Stay (HOSPITAL_COMMUNITY): Payer: Medicare HMO | Admitting: Occupational Therapy

## 2019-04-15 LAB — BASIC METABOLIC PANEL
Anion gap: 15 (ref 5–15)
Anion gap: 15 (ref 5–15)
BUN: 82 mg/dL — ABNORMAL HIGH (ref 8–23)
BUN: 85 mg/dL — ABNORMAL HIGH (ref 8–23)
CO2: 23 mmol/L (ref 22–32)
CO2: 25 mmol/L (ref 22–32)
Calcium: 9.5 mg/dL (ref 8.9–10.3)
Calcium: 9.6 mg/dL (ref 8.9–10.3)
Chloride: 103 mmol/L (ref 98–111)
Chloride: 100 mmol/L (ref 98–111)
Creatinine, Ser: 4.02 mg/dL — ABNORMAL HIGH (ref 0.61–1.24)
Creatinine, Ser: 4.33 mg/dL — ABNORMAL HIGH (ref 0.61–1.24)
GFR calc Af Amer: 16 mL/min — ABNORMAL LOW (ref >60)
GFR calc Af Amer: 15 mL/min — ABNORMAL LOW (ref >60)
GFR calc non Af Amer: 14 mL/min — ABNORMAL LOW (ref >60)
GFR calc non Af Amer: 13 mL/min — ABNORMAL LOW (ref >60)
Glucose, Bld: 94 mg/dL (ref 70–99)
Glucose, Bld: 118 mg/dL — ABNORMAL HIGH (ref 70–99)
Potassium: 6 mmol/L — ABNORMAL HIGH (ref 3.5–5.1)
Potassium: 6 mmol/L — ABNORMAL HIGH (ref 3.5–5.1)
Sodium: 141 mmol/L (ref 135–145)
Sodium: 140 mmol/L (ref 135–145)

## 2019-04-15 LAB — POTASSIUM: Potassium: 5.9 mmol/L — ABNORMAL HIGH (ref 3.5–5.1)

## 2019-04-15 LAB — CBC WITH DIFFERENTIAL/PLATELET
Abs Immature Granulocytes: 0.1 10*3/uL — ABNORMAL HIGH (ref 0.00–0.07)
Basophils Absolute: 0 10*3/uL (ref 0.0–0.1)
Basophils Relative: 0 %
Eosinophils Absolute: 0.3 10*3/uL (ref 0.0–0.5)
Eosinophils Relative: 4 %
HCT: 33.7 % — ABNORMAL LOW (ref 39.0–52.0)
Hemoglobin: 10.3 g/dL — ABNORMAL LOW (ref 13.0–17.0)
Immature Granulocytes: 1 %
Lymphocytes Relative: 24 %
Lymphs Abs: 1.9 10*3/uL (ref 0.7–4.0)
MCH: 29 pg (ref 26.0–34.0)
MCHC: 30.6 g/dL (ref 30.0–36.0)
MCV: 94.9 fL (ref 80.0–100.0)
Monocytes Absolute: 1.1 10*3/uL — ABNORMAL HIGH (ref 0.1–1.0)
Monocytes Relative: 14 %
Neutro Abs: 4.5 10*3/uL (ref 1.7–7.7)
Neutrophils Relative %: 57 %
Platelets: 190 10*3/uL (ref 150–400)
RBC: 3.55 MIL/uL — ABNORMAL LOW (ref 4.22–5.81)
RDW: 13.2 % (ref 11.5–15.5)
WBC: 7.9 10*3/uL (ref 4.0–10.5)
nRBC: 0 % (ref 0.0–0.2)

## 2019-04-15 LAB — GLUCOSE, CAPILLARY
Glucose-Capillary: 112 mg/dL — ABNORMAL HIGH (ref 70–99)
Glucose-Capillary: 80 mg/dL (ref 70–99)

## 2019-04-15 MED ORDER — DEXTROSE 50 % IV SOLN: 1.0000 | Freq: Once | INTRAVENOUS | Status: DC

## 2019-04-15 MED ORDER — FUROSEMIDE 10 MG/ML IJ SOLN
60.0000 mg | Freq: Once | INTRAMUSCULAR | Status: AC
  Administered 2019-04-15: 60 mg via INTRAVENOUS
  Filled 2019-04-15: qty 6

## 2019-04-15 MED ORDER — INSULIN ASPART 100 UNIT/ML ~~LOC~~ SOLN: 5.0000 [IU] | Freq: Once | SUBCUTANEOUS | Status: DC

## 2019-04-15 MED ORDER — SODIUM CHLORIDE 0.9 % IV SOLN
INTRAVENOUS | Status: AC
  Administered 2019-04-15: 12:00:00 via INTRAVENOUS

## 2019-04-15 MED ORDER — SODIUM CHLORIDE 0.9 % IV SOLN
INTRAVENOUS | Status: DC
  Administered 2019-04-16: 02:00:00 via INTRAVENOUS

## 2019-04-15 MED ORDER — ORAL CARE MOUTH RINSE
15.0000 mL | Freq: Two times a day (BID) | OROMUCOSAL | Status: DC
  Administered 2019-04-16 – 2019-04-18 (×5): 15 mL via OROMUCOSAL

## 2019-04-15 MED ORDER — SODIUM POLYSTYRENE SULFONATE 15 GM/60ML PO SUSP
30.0000 g | Freq: Once | ORAL | Status: AC
  Administered 2019-04-16: 30 g via RECTAL
  Filled 2019-04-15: qty 120

## 2019-04-15 MED ORDER — DEXTROSE 50 % IV SOLN
1.0000 | Freq: Once | INTRAVENOUS | Status: AC
  Administered 2019-04-15: 50 mL via INTRAVENOUS
  Filled 2019-04-15: qty 50

## 2019-04-15 MED ORDER — INSULIN ASPART 100 UNIT/ML ~~LOC~~ SOLN
5.0000 [IU] | Freq: Once | SUBCUTANEOUS | Status: AC
  Administered 2019-04-15: 5 [IU] via INTRAVENOUS

## 2019-04-15 NOTE — Progress Notes (Signed)
Dr. Bruna Potter notified of new onset afib. Nurse verified pulse rate with doctor. Doctor stated "okay" and disconnected the call.

## 2019-04-15 NOTE — Progress Notes (Signed)
Patient ID: Gregory [name], male   DOB: 1951-06-14, 68 y.o.   MRN: [id number] [facility] Progress Note   Assessment/ Plan:   #  Acute kidney injury on chronic kidney disease stage III:  With bladder outlet obstruction/obstructive uropathy.  It is highly likely that he has had this obstruction for >2 weeks and may have significant residual renal injury - leave indwelling Foley until seen by urology as outpatient - continues to improve with supportive care from an AKI standpoint   # Hyperkalemia   - s/p kayexalate enema  - repeat lasix  - repeat potassium at noon --low potassium diet restriction (difficult with his dietary needs due to dysphagia)  # Hypernatremia: Limited access to free water due to dysphagia precautions.  Resolved.  # Hypertension: acceptable   # Anemia: Improving; Replete iron stores; has had resolution of hematuria.   # Elevated troponin levels: Trending down last eval  # Acute CVA with right-sided weakness/expressive aphasia: s/p speech eval and for rehab   Subjective:   Patient was transferred to 5 [facility] after hyperkalemia overnight.  He was given a kayexalate enema for same.  He is charted as having a BM.  He is also charted as having lokelma yesterday once as well (difficult to administer per nursing with his dsyphagia).  He has been sitting up in the chair today and feels ok.   He had 1 liters of UOP over 4/24 evening charted.   Review of systems:   Limited with dysphagia but nods or shakes head to questions Denies shortness of breath  Denies any chest pain Denies abdominal pain Denies nausea/vomiting   Objective:   BP (!) 125/94 (BP Location: Left Arm)   Pulse (!) 107   Temp 97.8 F (36.6 C) (Oral)   Resp 18   SpO2 93%   Intake/Output Summary (Last 24 hours) at 04/15/2019 1011 Last data filed at 04/15/2019 0416 Gross per 24 hour  Intake 50 ml  Output 1000 ml  Net -950 ml   Weight change:   Physical Exam:  Gen: elderly male in  bed in NAD seated in chair CVS: RRR; S1, S2 Resp: clear to auscultation bilaterally and normal work of breathing Abd: his abdomen is softly distended/obese habitus; nontender on exam  Ext: trace lower extremity edema   Neuro: Aphasia; awake and interactive.  Mostly nonverbal but nods and shakes head appropriately GU foley intact   Imaging: No results found.  Labs: BMET Recent Labs  Lab 04/10/19 0518 04/11/19 0610 04/13/19 0517 04/13/19 1848 04/14/19 0725 04/14/19 1618 04/14/19 1806 04/14/19 2207 04/15/19 0549  NA 142 139 144  --  143  --  140 140 141  K 4.5 4.8 6.0* 5.8* 5.7* 6.7* 6.3* 6.3* 6.0*  CL 97* 96* 106  --  103  --  103 103 103  CO2 30 28 26   --  26  --  24 24 23   GLUCOSE 120* 104* 95  --  94  --  85 168* 94  BUN 104* 100* 86*  --  80*  --  78* 82* 82*  CREATININE 5.78* 5.13* 4.34*  --  3.94*  --  4.05* 4.03* 4.02*  CALCIUM 8.2* 8.1* 8.5*  --  9.3  --  9.3 9.0 9.5  PHOS  --   --  5.4*  --  5.4*  --   --   --   --    CBC Recent Labs  Lab 04/10/19 0518 04/11/19 0610 04/13/19 0517 04/15/19  0549  WBC 10.9* 10.2 9.2 7.9  NEUTROABS  --  6.7  --  4.5  HGB 10.0* 9.6* 9.4* 10.3*  HCT 33.5* 32.5* 32.3* 33.7*  MCV 93.6 93.9 96.4 94.9  PLT 183 169 182 190    Medications:    . aspirin  300 mg Rectal Daily   Or  . aspirin  325 mg Oral Daily  . atorvastatin  80 mg Oral q1800  . clopidogrel  75 mg Oral Daily  . heparin  5,000 Units Subcutaneous Q8H  . mouth rinse  15 mL Mouth Rinse BID  . metoprolol tartrate  25 mg Oral BID  . sodium chloride flush  3 mL Intravenous Q12H  . sodium chloride flush  3 mL Intravenous Q12H   [name] 04/15/2019

## 2019-04-15 NOTE — Evaluation (Signed)
Occupational Therapy Evaluation Patient Details Name: Gregory HazyDonald Lee [name] MRN: [id number] DOB: November 09, 1951 Today's Date: 04/15/2019    History of Present Illness Pt is a 68 y.o. male initially admitted 04/05/19 with expressive aphasia and R-side weakness due to acute CVA (hospital course complicated by NSTEMI and AKI) with d/c to CIR on 04/10/19, [facility] with hyperkal[facility]. PMH includes CAD, HTN, OSA on CPAP; left insular and posterior frontal infarct.   Clinical Impression   Pt PTA: [facility] before rehospitalized. Pt currently performing bed mobility with minA and sit to stands with modA +2; stedy for transfers due to R sided weakness. Expressive aphasia present; inconsistent comprehensive aphasia present as pt answering yes/no questions appropriately. Pt requires set-upA for grooming to minA for suction. MaxA to totalA for LB ADL. Pt progressing since initial eval earlier this month. OT to focus on ADL, attention to R side and mobility in CIR setting. OT to follow acutely.    Follow Up Recommendations  CIR;Supervision/Assistance - 24 hour    Equipment Recommendations  None recommended by OT    Recommendations for Other Services Rehab consult     Precautions / Restrictions Precautions Precautions: Fall Precaution Comments: Expressive/receptive aphasia, R hemiparesis Restrictions Weight Bearing Restrictions: No      Mobility Bed Mobility Overal bed mobility: Needs Assistance Bed Mobility: Supine to Sit Rolling: Min guard;Min assist Sidelying to sit: Mod assist Supine to sit: Mod assist Sit to supine: Mod assist;+2 for physical assistance   General bed mobility comments: Initated movement towards L-side of bed well and using L-side to assist; modA to assist trunk elevation and assist scooting hips to EOB  Transfers Overall transfer level: Needs assistance Equipment used: 2 person hand held assist Transfers: Sit to/from  Stand Sit to Stand: Mod assist;+2 physical assistance         General transfer comment: Good initiation of standing using LUE to pull on Stedy; modA+2 to assist trunk elevation. R lateral lean upon standing requiring assist to achieve and maintain midline posture. Max, multimodal cues to achieve fully upright, but pt only able to maintain for ~5 seconds before fatigue    Balance Overall balance assessment: Needs assistance Sitting-balance support: Feet supported;Single extremity supported Sitting balance-Leahy Scale: Fair Sitting balance - Comments: assist required initially and then able to maintain sitting balance EOB with min guard Postural control: Right lateral lean Standing balance support: Bilateral upper extremity supported Standing balance-Leahy Scale: Zero                             ADL either performed or assessed with clinical judgement   ADL Overall ADL's : Needs assistance/impaired Eating/Feeding: Moderate assistance   Grooming: Wash/dry hands;Wash/dry face;Oral care;Brushing hair;Sitting;Set up(with LUE)   Upper Body Bathing: Minimal assistance;Sitting   Lower Body Bathing: Maximal assistance;Sitting/lateral leans;Sit to/from stand   Upper Body Dressing : Minimal assistance;Sitting   Lower Body Dressing: Maximal assistance;Sitting/lateral leans;Sit to/from stand   Toilet Transfer: Moderate assistance;+2 for physical assistance;+2 for safety/equipment;BSC   Toileting- Clothing Manipulation and Hygiene: Total assistance;Sit to/from stand       Functional mobility during ADLs: Moderate assistance;+2 for physical assistance;+2 for safety/equipment(stedy for transfers) General ADL Comments: pt with stedy used to transfer from bed to chair with less assist since last admission.     Vision Baseline Vision/History: No visual deficits Vision Assessment?: No apparent visual deficits     Perception  Praxis      Pertinent Vitals/Pain Pain  Assessment: Faces Faces Pain Scale: Hurts little more Pain Location: R knee with pressure Pain Descriptors / Indicators: Grimacing Pain Intervention(s): Monitored during session     Hand Dominance Right(now weak side)   Extremity/Trunk Assessment Upper Extremity Assessment Upper Extremity Assessment: Generalized weakness;RUE deficits/detail RUE Deficits / Details: demonstrates attempt to push off bed surface with R UE and able to abduct from body  RUE Coordination: decreased fine motor;decreased gross motor   Lower Extremity Assessment Lower Extremity Assessment: Defer to PT evaluation RLE Deficits / Details: weakness s/p CVA   Cervical / Trunk Assessment Cervical / Trunk Assessment: Other exceptions Cervical / Trunk Exceptions: decreased trunk activation and control on Rt    Communication Communication Communication: Receptive difficulties;Expressive difficulties   Cognition Arousal/Alertness: Awake/alert Behavior During Therapy: Flat affect Overall Cognitive Status: Difficult to assess Area of Impairment: Following commands;Problem solving                       Following Commands: Follows one step commands with increased time     Problem Solving: Requires verbal cues General Comments: Initially nodding "yes" to majority of questions although contradictory; by end of session, seems to be nodding yes/no appropriately. Able to make needs known. Encouraged to attempt verbal expression more often   General Comments       Exercises     Shoulder Instructions      Home Living Family/patient expects to be discharged to:: Private residence Living Arrangements: Spouse/significant other Available Help at Discharge: Family Type of Home: House Home Access: Stairs to enter Secretary/administrator of Steps: 2   Home Layout: One level     Bathroom Shower/Tub: Chief Strategy Officer: Standard         Additional Comments: Home set-up information taken  from CIR evaluation 4 days ago      Prior Functioning/Environment Level of Independence: Needs assistance        Comments: difficult to assess PLOF secondary to communication impairments        OT Problem List: Decreased strength;Decreased range of motion;Decreased activity tolerance;Impaired balance (sitting and/or standing);Impaired vision/perception;Decreased coordination;Decreased cognition;Decreased safety awareness;Decreased knowledge of use of DME or AE;Impaired sensation;Impaired UE functional use;Obesity      OT Treatment/Interventions: Self-care/ADL training;Neuromuscular education;DME and/or AE instruction;Therapeutic activities;Cognitive remediation/compensation;Visual/perceptual remediation/compensation;Patient/family education;Balance training    OT Goals(Current goals can be found in the care plan section) Acute Rehab OT Goals Patient Stated Goal: Agreeable for continued rehab OT Goal Formulation: Patient unable to participate in goal setting Time For Goal Achievement: 05/07/19 Potential to Achieve Goals: Good ADL Goals Pt Will Perform Grooming: with modified independence;sitting Pt Will Perform Toileting - Clothing Manipulation and hygiene: with max assist;sit to/from stand Pt/caregiver will Perform Home Exercise Program: Right Upper extremity;With Supervision;With written HEP provided;Increased strength Additional ADL Goal #1: Pt will attend to R side during ADL with minimal cues.  OT Frequency: Min 2X/week   Barriers to D/C: Decreased caregiver support          Co-evaluation              AM-PAC OT "6 Clicks" Daily Activity     Outcome Measure Help from another person eating meals?: A Lot Help from another person taking care of personal grooming?: A Little Help from another person toileting, which includes using toliet, bedpan, or urinal?: Total Help from another person bathing (including washing, rinsing, drying)?: A Lot Help from another person  to  put on and taking off regular upper body clothing?: A Lot Help from another person to put on and taking off regular lower body clothing?: Total 6 Click Score: 11   End of Session Equipment Utilized During Treatment: Gait belt Nurse Communication: Mobility status;Precautions  Activity Tolerance: Patient tolerated treatment well Patient left: in chair;with call bell/phone within reach;with bed alarm set  OT Visit Diagnosis: Unsteadiness on feet (R26.81);Hemiplegia and hemiparesis Hemiplegia - Right/Left: Right Hemiplegia - dominant/non-dominant: Non-Dominant Hemiplegia - caused by: Cerebral infarction                Time: 1610-9604 OT Time Calculation (min): 26 min Charges:  OT General Charges $OT Visit: 1 Visit OT Evaluation $OT Eval Moderate Complexity: 1 Mod  Cristi Loron) Glendell Docker OTR/L Acute Rehabilitation Services Pager: [phone] Office: [phone]   Lonzo Cloud 04/15/2019, 3:46 PM

## 2019-04-15 NOTE — Progress Notes (Addendum)
PROGRESS NOTE    Gregory [name]  WUJ:[id number] DOB: October 17, 1951 DOA: 04/14/2019 PCP: System, Pcp Not In   Brief Narrative Gregory [name] is a 68 y.o. male with medical history significant for CKD III, hypertension, and hyperlipidemia who was admitted to the hospital on 04/05/2019 with acute onset of expressive aphasia and right-sided weakness, and was admitted with acute CVA.  He was also found to have acute kidney injury on admission with hyperkalemia, and the admission was also complicated by NSTEMI.Marland Kitchen. He was discharged to inpatient rehabilitation on 04/10/2019, to continue with rehabilitation when he developed hypokalemia.  Nephrology was following and attempted to treat hyperkalemia while in rehab. They were advised that patient need to be readmitted in the hospital for treatment of hyperkalemia.  That necessitated this readmission.  Assessment & Plan:   Principal Problem:   Hyperkalemia Active Problems:   Acute renal failure superimposed on stage 3 chronic kidney disease (HCC)   Essential hypertension   Stroke (cerebrum) (HCC)   Multifocal atrial tachycardia (HCC)   Embolic cerebral infarction (HCC)   Bladder outlet obstruction   #1.  Hyperkalemia without EKG changes.  He is status post Kayexalate enema, insulin and  dextrose.  He received a dose of Lasix and recheck of potassium is at 6.0. Nephrology following.  #2.  Acute kidney injury superimposed on chronic kidney disease stage III.  Patient was found to have bilateral hydronephrosis due to bladder outlet obstruction.  He status post Foley catheter placement with gradual improvement in renal function back to baseline. Nephrology is following  #3.  Cerebrovascular accident (stroke-04/06/2019) with acute aphasia and right hemiparesis.  Currently undergoing rehabilitation inpatient. Patient has significant aphasia and dysphagia. Able to swallow medications with applesauce and comprehensible but unable to verbalize. Continue  with aspirin, Plavix and statin secondary stroke prevention Continue with PT/OT/speech swallow eval  #4.  Multifactorial atrial tachycardia controlled with metoprolol  #5.  Hypertension BP currently at goal Continue with the hypertensive medication-metoprolol  #6. Anemia of chronic kidney disease Continue to monitor CBC and transfuse with hemoglobin less than 7.0  #7. Obstructive sleep apnea CPAP nightly and PRN.  #8.  Bladder outlet obstruction with resultant bilateral hydronephrosis.  Status post Foley catheter placement.  DVT prophylaxis: Heparin subcut   Code Status: Full code  Family Communication:   Disposition Plan: For possible discharge back to rehab tomorrow with improvement of hyperkalemia  Consultants:   Nephrology  Procedures: None  Antimicrobials: None      Subjective: Patient was seen and evaluated at bedside.  Remarkably a phasic but able to comprehend.  Sitting out of bed in chair.  Once patient swallow his medications on applesauce.  No  major issues reported except for hyperkalemia.  Nephrology on board.  PT and OT also following. We will plan to discharge patient back to rehab with improvement in hyperkalemia.  Objective: Vitals:   04/14/19 2336 04/15/19 0514 04/15/19 0952 04/15/19 1321  BP: 127/73 (!) 126/106 (!) 125/94 (!) 85/57  Pulse: 90 85 (!) 107 70  Resp:  16 18 18   Temp:  98.2 F (36.8 C) 97.8 F (36.6 C) 98.1 F (36.7 C)  TempSrc:  Oral Oral Oral  SpO2:  93% 93% 93%    Intake/Output Summary (Last 24 hours) at 04/15/2019 1722 Last data filed at 04/15/2019 1300 Gross per 24 hour  Intake 290 ml  Output 1000 ml  Net -710 ml   There were no vitals filed for this visit.  Examination:  General exam:  Appears calm and comfortable. Aphasic Respiratory system: Clear to auscultation. Respiratory effort normal. Cardiovascular system: S1 & S2 heard, RRR. No JVD, murmurs, rubs, gallops or clicks. No pedal edema. Gastrointestinal system:  Abdomen is nondistended, soft and nontender. No organomegaly or masses felt. Normal bowel sounds heard. Central nervous system: Alert and aphasic but able to comprejhend. Right hemiparesis. . Extremities: Right hemiparesis. Skin: No rashes, lesions or ulcers Psychiatry:  Mood & affect appropriate.     Data Reviewed: I have personally reviewed following labs and imaging studies  CBC: Recent Labs  Lab 04/10/19 0518 04/11/19 0610 04/13/19 0517 04/15/19 0549  WBC 10.9* 10.2 9.2 7.9  NEUTROABS  --  6.7  --  4.5  HGB 10.0* 9.6* 9.4* 10.3*  HCT 33.5* 32.5* 32.3* 33.7*  MCV 93.6 93.9 96.4 94.9  PLT 183 169 182 190   Basic Metabolic Panel: Recent Labs  Lab 04/13/19 0517  04/14/19 0725 04/14/19 1618 04/14/19 1806 04/14/19 2207 04/15/19 0549 04/15/19 1344  NA 144  --  143  --  140 140 141 140  K 6.0*   < > 5.7* 6.7* 6.3* 6.3* 6.0* 6.0*  CL 106  --  103  --  103 103 103 100  CO2 26  --  26  --  GLUCOSE 95  --  94  --  85 168* 94 118*  BUN 86*  --  80*  --  78* 82* 82* 85*  CREATININE 4.34*  --  3.94*  --  4.05* 4.03* 4.02* 4.33*  CALCIUM 8.5*  --  9.3  --  9.3 9.0 9.5 9.6  PHOS 5.4*  --  5.4*  --   --   --   --   --    < > = values in this interval not displayed.   GFR: Estimated Creatinine Clearance: 15.9 mL/min (A) (by C-G formula based on SCr of 4.33 mg/dL (H)). Liver Function Tests: Recent Labs  Lab 04/11/19 0610 04/13/19 0517 04/14/19 0725  AST 144*  --   --   ALT 75*  --   --   ALKPHOS 94  --   --   BILITOT 0.5  --   --   PROT 6.9  --   --   ALBUMIN 2.7* 2.6* 2.8*   No results for input(s): LIPASE, AMYLASE in the last 168 hours. No results for input(s): AMMONIA in the last 168 hours. Coagulation Profile: No results for input(s): INR, PROTIME in the last 168 hours. Cardiac Enzymes: No results for input(s): CKTOTAL, CKMB, CKMBINDEX, TROPONINI in the last 168 hours. BNP (last 3 results) No results for input(s): PROBNP in the last 8760 hours.  HbA1C: No results for input(s): HGBA1C in the last 72 hours. CBG: No results for input(s): GLUCAP in the last 168 hours. Lipid Profile: No results for input(s): CHOL, HDL, LDLCALC, TRIG, CHOLHDL, LDLDIRECT in the last 72 hours. Thyroid Function Tests: No results for input(s): TSH, T4TOTAL, FREET4, T3FREE, THYROIDAB in the last 72 hours. Anemia Panel: No results for input(s): VITAMINB12, FOLATE, FERRITIN, TIBC, IRON, RETICCTPCT in the last 72 hours. Sepsis Labs: No results for input(s): PROCALCITON, LATICACIDVEN in the last 168 hours.  No results found for this or any previous visit (from the past 240 hour(s)).       Radiology Studies: No results found.      Scheduled Meds: . aspirin  300 mg Rectal Daily   Or  . aspirin  325 mg Oral Daily  .  atorvastatin  80 mg Oral q1800  . clopidogrel  75 mg Oral Daily  . heparin  5,000 Units Subcutaneous Q8H  . mouth rinse  15 mL Mouth Rinse BID  . metoprolol tartrate  25 mg Oral BID  . sodium chloride flush  3 mL Intravenous Q12H  . sodium chloride flush  3 mL Intravenous Q12H   Continuous Infusions: . sodium chloride    . sodium chloride 100 mL/hr at 04/15/19 1150     LOS: 1 day      Aquilla Hacker, MD [facility] Pager [phone]  If 7PM-7AM, please contact night-coverage [email or web address] Password [email or web address] 04/15/2019, 5:22 PM

## 2019-04-15 NOTE — Progress Notes (Signed)
RT Note:  Spoke to RN of patient, she stated that patient has been refusing CPAP despite order.  In note, patient uses 2L O2 at night, but does not sleep with a CPAP.  Will continue to monitor.

## 2019-04-15 NOTE — Progress Notes (Signed)
Lab called with potassium, stated specimen was hemolyzed. Requested redraw.

## 2019-04-15 NOTE — Evaluation (Signed)
Physical Therapy Evaluation Patient Details Name: Gregory [name] MRN: [id number] DOB: 09-03-1951 Today's Date: 04/15/2019   History of Present Illness  Pt is a 68 y.o. male initially admitted 04/05/19 with expressive aphasia and R-side weakness due to acute CVA (hospital course complicated by NSTEMI and AKI) with d/c to CIR on 04/10/19, now readmitted from [facility] 04/14/19 with hyperkalemia. PMH includes CAD, HTN, OSA on CPAP; left insular and posterior frontal infarct.    Clinical Impression  Pt presents with an overall decrease in functional mobility secondary to above. Pt has been receiving PT/OT/SLP with CIR from 4/20-4/24, now readmitted to hospital. Today, pt required modA+2 to transfer to recliner with [facility] lift. Pt demonstrating improvements in functional mobility since initial acute PT evaluation. Remains limited by R-side hemiparesis and aphasia/expressive difficulties. Pt would benefit from continued acute PT services to maximize functional mobility and independence prior to d/c with continued CIR-level therapies.     Follow Up Recommendations CIR;Supervision for mobility/OOB    Equipment Recommendations  (TBD)    Recommendations for Other Services Rehab consult     Precautions / Restrictions Precautions Precautions: Fall Precaution Comments: Expressive/receptive aphasia, R hemiparesis Restrictions Weight Bearing Restrictions: No      Mobility  Bed Mobility Overal bed mobility: Needs Assistance Bed Mobility: Supine to Sit     Supine to sit: Mod assist     General bed mobility comments: Initated movement towards L-side of bed well and using L-side to assist; modA to assist trunk elevation and assist scooting hips to EOB  Transfers Overall transfer level: Needs assistance   Transfers: Sit to/from Stand Sit to Stand: Mod assist;+2 physical assistance         General transfer comment: Good initiation of standing using LUE to pull on Stedy; modA+2 to assist trunk  elevation. R lateral lean upon standing requiring assist to achieve and maintain midline posture. Max, multimodal cues to achieve fully upright, but pt only able to maintain for ~5 seconds before fatigue  Ambulation/Gait                Stairs            Wheelchair Mobility    Modified Rankin (Stroke Patients Only)       Balance Overall balance assessment: Needs assistance Sitting-balance support: Feet supported;Single extremity supported Sitting balance-Leahy Scale: Fair     Standing balance support: Bilateral upper extremity supported Standing balance-Leahy Scale: Zero                               Pertinent Vitals/Pain Pain Assessment: Faces Faces Pain Scale: Hurts little more Pain Location: R knee with pressure Pain Descriptors / Indicators: Grimacing Pain Intervention(s): Limited activity within patient's tolerance;Repositioned    Home Living Family/patient expects to be discharged to:: Private residence Living Arrangements: Spouse/significant other Available Help at Discharge: Family Type of Home: House Home Access: Stairs to enter   Secretary/administrator of Steps: 2 Home Layout: One level   Additional Comments: Home set-up information taken from CIR evaluation 4 days ago    Prior Function Level of Independence: Needs assistance         Comments: difficult to assess PLOF secondary to communication impairments     Hand Dominance        Extremity/Trunk Assessment   Upper Extremity Assessment Upper Extremity Assessment: RUE deficits/detail;Defer to OT evaluation RUE Coordination: decreased fine motor;decreased gross motor    Lower Extremity Assessment  Lower Extremity Assessment: RLE deficits/detail RLE Deficits / Details: Knee flex/ext 3/5 (limited by anterior knee pain; swelling and warmth noted around patella); ankle 2-3/5; hip <3/5 RLE Coordination: decreased fine motor;decreased gross motor       Communication    Communication: Receptive difficulties;Expressive difficulties  Cognition Arousal/Alertness: Awake/alert Behavior During Therapy: Flat affect Overall Cognitive Status: Difficult to assess Area of Impairment: Following commands;Problem solving                       Following Commands: Follows one step commands with increased time     Problem Solving: Requires verbal cues General Comments: Initially nodding "yes" to majority of questions although contradictory; by end of session, seems to be nodding yes/no appropriately. Able to make needs known. Encouraged to attempt verbal expression more often      General Comments      Exercises     Assessment/Plan    PT Assessment Patient needs continued PT services  PT Problem List Decreased strength;Decreased activity tolerance;Decreased balance;Decreased mobility;Decreased coordination;Decreased knowledge of use of DME;Decreased safety awareness;Decreased knowledge of precautions;Cardiopulmonary status limiting activity       PT Treatment Interventions DME instruction;Gait training;Stair training;Functional mobility training;Therapeutic activities;Therapeutic exercise;Balance training;Neuromuscular re-education;Patient/family education    PT Goals (Current goals can be found in the Care Plan section)  Acute Rehab PT Goals Patient Stated Goal: Agreeable for continued rehab PT Goal Formulation: With patient Time For Goal Achievement: 04/29/19 Potential to Achieve Goals: Good    Frequency Min 4X/week   Barriers to discharge        Co-evaluation PT/OT/SLP Co-Evaluation/Treatment: Yes Reason for Co-Treatment: Complexity of the patient's impairments (multi-system involvement);Necessary to address cognition/behavior during functional activity;For patient/therapist safety;To address functional/ADL transfers PT goals addressed during session: Mobility/safety with mobility;Balance         AM-PAC PT "6 Clicks" Mobility  Outcome  Measure Help needed turning from your back to your side while in a flat bed without using bedrails?: A Lot Help needed moving from lying on your back to sitting on the side of a flat bed without using bedrails?: A Lot Help needed moving to and from a bed to a chair (including a wheelchair)?: A Lot Help needed standing up from a chair using your arms (e.g., wheelchair or bedside chair)?: A Lot Help needed to walk in hospital room?: Total Help needed climbing 3-5 steps with a railing? : Total 6 Click Score: 10    End of Session Equipment Utilized During Treatment: Gait belt Activity Tolerance: Patient tolerated treatment well Patient left: in chair;with call bell/phone within reach;with chair alarm set Nurse Communication: Mobility status;Need for lift equipment PT Visit Diagnosis: Other abnormalities of gait and mobility (R26.89);Hemiplegia and hemiparesis Hemiplegia - Right/Left: Right Hemiplegia - dominant/non-dominant: Non-dominant Hemiplegia - caused by: Cerebral infarction    Time: [id number] PT Time Calculation (min) (ACUTE ONLY): 29 min   Charges:   PT Evaluation $PT Eval Moderate Complexity: 1 Mod        Ina HomesJaclyn Shantinique Picazo, PT, DPT Acute Rehabilitation Services  Pager [phone] Office [phone]  Malachy ChamberJaclyn L Kemia Wendel 04/15/2019, 10:26 AM

## 2019-04-15 NOTE — Progress Notes (Signed)
Inpatient Rehabilitation-Admissions Coordinator   Pt was transferred from CIR to telemetry bed on 4/24 due to acute medical concerns. AC will follow for possible return to CIR once medically appropriate and if insurance approves.   Nanine Means, OTR/L  Rehab Admissions Coordinator  [phone] 04/15/2019 10:51 AM

## 2019-04-16 ENCOUNTER — Observation Stay (HOSPITAL_COMMUNITY): Payer: Medicare HMO

## 2019-04-16 DIAGNOSIS — D62 Acute posthemorrhagic anemia: Secondary | ICD-10-CM | POA: Diagnosis not present

## 2019-04-16 DIAGNOSIS — Z7902 Long term (current) use of antithrombotics/antiplatelets: Secondary | ICD-10-CM | POA: Diagnosis not present

## 2019-04-16 DIAGNOSIS — Z79899 Other long term (current) drug therapy: Secondary | ICD-10-CM | POA: Diagnosis not present

## 2019-04-16 DIAGNOSIS — N320 Bladder-neck obstruction: Secondary | ICD-10-CM | POA: Diagnosis not present

## 2019-04-16 DIAGNOSIS — I6340 Cerebral infarction due to embolism of unspecified cerebral artery: Secondary | ICD-10-CM | POA: Diagnosis present

## 2019-04-16 DIAGNOSIS — I63512 Cerebral infarction due to unspecified occlusion or stenosis of left middle cerebral artery: Secondary | ICD-10-CM | POA: Diagnosis not present

## 2019-04-16 DIAGNOSIS — G4733 Obstructive sleep apnea (adult) (pediatric): Secondary | ICD-10-CM | POA: Diagnosis present

## 2019-04-16 DIAGNOSIS — I749 Embolism and thrombosis of unspecified artery: Secondary | ICD-10-CM | POA: Diagnosis not present

## 2019-04-16 DIAGNOSIS — I69391 Dysphagia following cerebral infarction: Secondary | ICD-10-CM | POA: Diagnosis not present

## 2019-04-16 DIAGNOSIS — E785 Hyperlipidemia, unspecified: Secondary | ICD-10-CM | POA: Diagnosis present

## 2019-04-16 DIAGNOSIS — I739 Peripheral vascular disease, unspecified: Secondary | ICD-10-CM | POA: Diagnosis present

## 2019-04-16 DIAGNOSIS — I10 Essential (primary) hypertension: Secondary | ICD-10-CM | POA: Diagnosis not present

## 2019-04-16 DIAGNOSIS — I471 Supraventricular tachycardia: Secondary | ICD-10-CM | POA: Diagnosis present

## 2019-04-16 DIAGNOSIS — N401 Enlarged prostate with lower urinary tract symptoms: Secondary | ICD-10-CM | POA: Diagnosis present

## 2019-04-16 DIAGNOSIS — G8110 Spastic hemiplegia affecting unspecified side: Secondary | ICD-10-CM | POA: Diagnosis not present

## 2019-04-16 DIAGNOSIS — E875 Hyperkalemia: Secondary | ICD-10-CM | POA: Diagnosis present

## 2019-04-16 DIAGNOSIS — G8191 Hemiplegia, unspecified affecting right dominant side: Secondary | ICD-10-CM | POA: Diagnosis present

## 2019-04-16 DIAGNOSIS — N179 Acute kidney failure, unspecified: Secondary | ICD-10-CM | POA: Diagnosis present

## 2019-04-16 DIAGNOSIS — R41844 Frontal lobe and executive function deficit: Secondary | ICD-10-CM | POA: Diagnosis not present

## 2019-04-16 DIAGNOSIS — Z7982 Long term (current) use of aspirin: Secondary | ICD-10-CM | POA: Diagnosis not present

## 2019-04-16 DIAGNOSIS — I214 Non-ST elevation (NSTEMI) myocardial infarction: Secondary | ICD-10-CM | POA: Diagnosis present

## 2019-04-16 DIAGNOSIS — E870 Hyperosmolality and hypernatremia: Secondary | ICD-10-CM | POA: Diagnosis present

## 2019-04-16 DIAGNOSIS — R1311 Dysphagia, oral phase: Secondary | ICD-10-CM | POA: Diagnosis present

## 2019-04-16 DIAGNOSIS — I69320 Aphasia following cerebral infarction: Secondary | ICD-10-CM | POA: Diagnosis not present

## 2019-04-16 DIAGNOSIS — N1330 Unspecified hydronephrosis: Secondary | ICD-10-CM | POA: Diagnosis present

## 2019-04-16 DIAGNOSIS — N189 Chronic kidney disease, unspecified: Secondary | ICD-10-CM | POA: Diagnosis not present

## 2019-04-16 DIAGNOSIS — R4701 Aphasia: Secondary | ICD-10-CM | POA: Diagnosis present

## 2019-04-16 DIAGNOSIS — I2510 Atherosclerotic heart disease of native coronary artery without angina pectoris: Secondary | ICD-10-CM | POA: Diagnosis present

## 2019-04-16 DIAGNOSIS — G8111 Spastic hemiplegia affecting right dominant side: Secondary | ICD-10-CM | POA: Diagnosis not present

## 2019-04-16 DIAGNOSIS — R0989 Other specified symptoms and signs involving the circulatory and respiratory systems: Secondary | ICD-10-CM | POA: Diagnosis not present

## 2019-04-16 DIAGNOSIS — Z9689 Presence of other specified functional implants: Secondary | ICD-10-CM | POA: Diagnosis present

## 2019-04-16 DIAGNOSIS — I129 Hypertensive chronic kidney disease with stage 1 through stage 4 chronic kidney disease, or unspecified chronic kidney disease: Secondary | ICD-10-CM | POA: Diagnosis present

## 2019-04-16 DIAGNOSIS — R195 Other fecal abnormalities: Secondary | ICD-10-CM | POA: Diagnosis not present

## 2019-04-16 DIAGNOSIS — D631 Anemia in chronic kidney disease: Secondary | ICD-10-CM | POA: Diagnosis present

## 2019-04-16 DIAGNOSIS — I69351 Hemiplegia and hemiparesis following cerebral infarction affecting right dominant side: Secondary | ICD-10-CM | POA: Diagnosis not present

## 2019-04-16 DIAGNOSIS — Z9989 Dependence on other enabling machines and devices: Secondary | ICD-10-CM | POA: Diagnosis not present

## 2019-04-16 DIAGNOSIS — M79604 Pain in right leg: Secondary | ICD-10-CM | POA: Diagnosis not present

## 2019-04-16 DIAGNOSIS — N183 Chronic kidney disease, stage 3 (moderate): Secondary | ICD-10-CM | POA: Diagnosis present

## 2019-04-16 LAB — BASIC METABOLIC PANEL
Anion gap: 14 (ref 5–15)
BUN: 84 mg/dL — ABNORMAL HIGH (ref 8–23)
CO2: 23 mmol/L (ref 22–32)
Calcium: 9.4 mg/dL (ref 8.9–10.3)
Chloride: 105 mmol/L (ref 98–111)
Creatinine, Ser: 4.24 mg/dL — ABNORMAL HIGH (ref 0.61–1.24)
GFR calc Af Amer: 15 mL/min — ABNORMAL LOW (ref >60)
GFR calc non Af Amer: 13 mL/min — ABNORMAL LOW (ref >60)
Glucose, Bld: 108 mg/dL — ABNORMAL HIGH (ref 70–99)
Potassium: 5.6 mmol/L — ABNORMAL HIGH (ref 3.5–5.1)
Sodium: 142 mmol/L (ref 135–145)

## 2019-04-16 LAB — COMPREHENSIVE METABOLIC PANEL
ALT: 112 U/L — ABNORMAL HIGH (ref 0–44)
AST: 90 U/L — ABNORMAL HIGH (ref 15–41)
Albumin: 2.7 g/dL — ABNORMAL LOW (ref 3.5–5.0)
Alkaline Phosphatase: 131 U/L — ABNORMAL HIGH (ref 38–126)
Anion gap: 12 (ref 5–15)
BUN: 83 mg/dL — ABNORMAL HIGH (ref 8–23)
CO2: 27 mmol/L (ref 22–32)
Calcium: 9.2 mg/dL (ref 8.9–10.3)
Chloride: 104 mmol/L (ref 98–111)
Creatinine, Ser: 4.34 mg/dL — ABNORMAL HIGH (ref 0.61–1.24)
GFR calc Af Amer: 15 mL/min — ABNORMAL LOW (ref >60)
GFR calc non Af Amer: 13 mL/min — ABNORMAL LOW (ref >60)
Glucose, Bld: 104 mg/dL — ABNORMAL HIGH (ref 70–99)
Potassium: 6 mmol/L — ABNORMAL HIGH (ref 3.5–5.1)
Sodium: 143 mmol/L (ref 135–145)
Total Bilirubin: 0.5 mg/dL (ref 0.3–1.2)
Total Protein: 7.3 g/dL (ref 6.5–8.1)

## 2019-04-16 LAB — GLUCOSE, CAPILLARY: Glucose-Capillary: 84 mg/dL (ref 70–99)

## 2019-04-16 LAB — MAGNESIUM: Magnesium: 1.8 mg/dL (ref 1.7–2.4)

## 2019-04-16 LAB — CBC
HCT: 33.2 % — ABNORMAL LOW (ref 39.0–52.0)
Hemoglobin: 10 g/dL — ABNORMAL LOW (ref 13.0–17.0)
MCH: 28.7 pg (ref 26.0–34.0)
MCHC: 30.1 g/dL (ref 30.0–36.0)
MCV: 95.4 fL (ref 80.0–100.0)
Platelets: 220 10*3/uL (ref 150–400)
RBC: 3.48 MIL/uL — ABNORMAL LOW (ref 4.22–5.81)
RDW: 13.6 % (ref 11.5–15.5)
WBC: 9.5 10*3/uL (ref 4.0–10.5)
nRBC: 0 % (ref 0.0–0.2)

## 2019-04-16 LAB — POTASSIUM: Potassium: 6 mmol/L — ABNORMAL HIGH (ref 3.5–5.1)

## 2019-04-16 MED ORDER — DEXTROSE 50 % IV SOLN: 1.0000 | Freq: Once | INTRAVENOUS | Status: DC

## 2019-04-16 MED ORDER — INSULIN ASPART 100 UNIT/ML ~~LOC~~ SOLN: 5.0000 [IU] | Freq: Once | SUBCUTANEOUS | Status: DC

## 2019-04-16 MED ORDER — SODIUM CHLORIDE 0.9 % IV BOLUS
500.0000 mL | Freq: Once | INTRAVENOUS | Status: AC
  Administered 2019-04-16: 500 mL via INTRAVENOUS

## 2019-04-16 MED ORDER — SODIUM BICARBONATE 8.4 % IV SOLN
50.0000 meq | Freq: Once | INTRAVENOUS | Status: AC
  Administered 2019-04-16: 50 meq via INTRAVENOUS
  Filled 2019-04-16: qty 50

## 2019-04-16 MED ORDER — SODIUM CHLORIDE 0.45 % IV SOLN
INTRAVENOUS | Status: DC
  Administered 2019-04-16 – 2019-04-18 (×5): via INTRAVENOUS

## 2019-04-16 MED ORDER — FUROSEMIDE 10 MG/ML IJ SOLN
60.0000 mg | Freq: Once | INTRAMUSCULAR | Status: AC
  Administered 2019-04-16: 60 mg via INTRAVENOUS
  Filled 2019-04-16: qty 6

## 2019-04-16 MED ORDER — FUROSEMIDE 10 MG/ML IJ SOLN
INTRAMUSCULAR | Status: AC
  Filled 2019-04-16: qty 4

## 2019-04-16 NOTE — H&P (Signed)
Physical Medicine and Rehabilitation Admission H&P    Chief complaint: Weakness HPI: Gregory [name] is a 68 year old right-handed male with history of chronic kidney disease stage III with creatinine baseline 1.6-1.7, hypertension maintained on HCTZ and lisinopril, hyperlipidemia.  Per chart review lives with spouse reportedly independent prior to admission.  Presented 04/05/2019 with acute onset of right-sided weakness and slurred speech.  Cranial CT scan showed possible early gray-white differentiation loss in the insula on the left.  Chronic small vessel ischemic changes.  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  No evidence of gross hemorrhage.  MRA negative.  Patient did not receive TPA.  Noted findings of hyperkalemia 6.8 as well as elevated creatinine 10.4 from baseline 1.70, troponin 3.29.  EKG with ST changes.  Cardiology services consulted for suspect non-STEMI medical management recommended.  Patient did undergo placement of loop recorder 04/07/2019 per Dr. Ladona Ridgel.  Renal ultrasound showed bilateral hydronephrosis with nephrology services consulted as well as urology.  A Foley catheter tube was placed and would remain in place until follow-up outpatient urology.  Renal function continued to improve and monitored with creatinine 4.08.  Maintained on aspirin for CVA prophylaxis.  Subcutaneous heparin for DVT prophylaxis.  Dysphagia #1 honey thick liquid diet.  Patient was admitted to inpatient rehab services 04/10/2019 with steady progressive gains.  Noted on 04/14/2019 with noted ongoing bouts of hyperkalemia and did not respond as expected to Lasix..  Nephrology recommended Kayexalate enema and insulin with dextrose.  Patient became more confused and it was felt would benefit from discharge to acute care services for ongoing monitoring.  EKG follow-up without peaked T waves hemodynamically stable but remained on telemetry.   Hyperkalemia improving 5.8 and placed on VELTASSA 16.8 g daily and patient and family were to be instructed also on a low potassium diet restriction .Follow-up renal services in regards to hyperkalemia acute kidney injury no plan for dialysis at this time .  Therapies have been resumed and patient was readmitted to inpatient rehab services for ongoing therapies.  Review of Systems  Constitutional: Negative for chills and fever.  HENT: Negative for hearing loss.   Eyes: Negative for blurred vision and double vision.  Respiratory: Negative for shortness of breath.   Cardiovascular: Positive for palpitations and leg swelling. Negative for chest pain.  Gastrointestinal: Positive for constipation and nausea. Negative for heartburn and vomiting.  Genitourinary: Positive for urgency. Negative for dysuria, flank pain and hematuria.  Musculoskeletal: Positive for joint pain and myalgias.  Skin: Negative for rash.  Neurological: Positive for weakness.  All other systems reviewed and are negative.  Past Medical History:  Diagnosis Date   AKI (acute kidney injury) (HCC)    CKD (chronic kidney disease) stage 2, GFR 60-89 ml/min    CVA (cerebral vascular accident) (HCC)    Hyperlipidemia    Hypertension    PVD (peripheral vascular disease) (HCC)    Past Surgical History:  Procedure Laterality Date   AORTIC ARCH ANGIOGRAPHY N/A 09/09/2018   Procedure: AORTIC ARCH ANGIOGRAPHY;  Surgeon: Sherren Kerns, MD;  Location: [facility] INVASIVE CV LAB;  Service: Cardiovascular;  Laterality: N/A;   CAROTID ANGIOGRAPHY Bilateral 09/09/2018   Procedure: CAROTID ANGIOGRAPHY;  Surgeon: Sherren Kerns, MD;  Location: [facility] INVASIVE CV LAB;  Service: Cardiovascular;  Laterality: Bilateral;   LOOP RECORDER INSERTION N/A 04/07/2019   Procedure: LOOP RECORDER INSERTION;  Surgeon: Marinus Maw, MD;  Location: [facility] INVASIVE  CV LAB;  Service: Cardiovascular;  Laterality: N/A;   History reviewed. No pertinent family  history. Social History:  has no history on file for tobacco, alcohol, and drug. Allergies: No Known Allergies Medications Prior to Admission  Medication Sig Dispense Refill   acetaminophen (TYLENOL) 325 MG tablet Take 2 tablets (650 mg total) by mouth every 4 (four) hours as needed for mild pain, fever or headache. 30 tablet 2   allopurinol (ZYLOPRIM) 300 MG tablet Take 300 mg by mouth daily.     aspirin EC 81 MG tablet Take 1 tablet (81 mg total) by mouth daily with breakfast. 30 tablet 2   atorvastatin (LIPITOR) 80 MG tablet Take 1 tablet (80 mg total) by mouth daily at 6 PM. (Patient taking differently: Take 80 mg by mouth at bedtime. ) 90 tablet 1   lisinopril (ZESTRIL) 40 MG tablet Take 40 mg by mouth daily.     vardenafil (LEVITRA) 20 MG tablet Take 20 mg by mouth daily as needed for erectile dysfunction.     clopidogrel (PLAVIX) 75 MG tablet Take 1 tablet (75 mg total) by mouth daily. 30 tablet 11   metoprolol tartrate (LOPRESSOR) 25 MG tablet Take 1 tablet (25 mg total) by mouth 3 (three) times daily. (Patient not taking: Reported on 04/15/2019) 90 tablet 2    Drug Regimen Review Drug regimen was reviewed and remains appropriate with no significant issues identified  Home: Home Living Family/patient expects to be discharged to:: Private residence Living Arrangements: Spouse/significant other Available Help at Discharge: Family Type of Home: House Home Access: Stairs to enter Secretary/administratorntrance Stairs-Number of Steps: 2 Home Layout: One level Bathroom Shower/Tub: Engineer, manufacturing systemsTub/shower unit Bathroom Toilet: Standard Additional Comments: Home set-up information taken from CIR evaluation 4 days ago   Functional History: Prior Function Level of Independence: Needs assistance Comments: difficult to assess PLOF secondary to communication impairments  Functional Status:  Mobility: Bed Mobility Overal bed mobility: Needs Assistance Bed Mobility: Supine to Sit Rolling: Min guard, Min  assist Sidelying to sit: Mod assist Supine to sit: Mod assist, +2 for safety/equipment Sit to supine: Mod assist, +2 for physical assistance General bed mobility comments: Mod A for bringing BLEs towards EOB. Pt pushing into upright posture with Min A and then Mod A to weight bear through RUE Transfers Overall transfer level: Needs assistance Equipment used: 2 person hand held assist Transfer via Lift Equipment: Stedy Transfers: Sit to/from Stand Sit to Stand: Mod assist, +2 physical assistance, Min assist General transfer comment: Min A +2 from EOB with use of stedy. After grooming at sink, pt fatigued and requiring Mod A +2 to stand from stedy and transfer to recliner.       ADL: ADL Overall ADL's : Needs assistance/impaired Eating/Feeding: Minimal assistance, Sitting Eating/Feeding Details (indicate cue type and reason): Pt requiring assistance to cut food into bite size pieces. Requiring assistance for bilateral tasks. Pt attempting to bring eggs to mouth with fork and dropping ~25% of food. Cued pt to use hands and eat as a finger food. Pt eating apple sauce with spoon successfully.  Grooming: Wash/dry face, Sitting, Moderate assistance, Maximal assistance(sitting on stedy) Grooming Details (indicate cue type and reason): Pt sitting on stedy at sink and participating in grooming. Challenging sitting balance during ADL. Pt able to sit at sink with Min-Mod A. During ADL, pt requiring Mod A for upright posture with lateral lean right. As he fatigued he required Max A to maintain sitting posture. Pt using LUE throughout and facilitating  weight bearing through RUE on sink.  Upper Body Bathing: Minimal assistance, Sitting Lower Body Bathing: Maximal assistance, Sitting/lateral leans, Sit to/from stand Upper Body Dressing : Minimal assistance, Sitting Lower Body Dressing: Maximal assistance Lower Body Dressing Details (indicate cue type and reason): don socks at bed level Toilet Transfer:  Moderate assistance, +2 for physical assistance(sit<>stand with stedy and simulated to recliner) Toilet Transfer Details (indicate cue type and reason): Min A for initial sit<>stand from EOB. As pt fatigued, he required Mod A +2 for sit<>stand from stedy.  Toileting- Clothing Manipulation and Hygiene: Total assistance, Sit to/from stand Functional mobility during ADLs: +2 for physical assistance, +2 for safety/equipment, Minimal assistance, Moderate assistance(stedy for transfers) General ADL Comments: Using stedy for pt to perform grooming at sink. Pt sitting up in recliner at end of session and performing self feeding.   Cognition: Cognition Overall Cognitive Status: Impaired/Different from baseline Arousal/Alertness: Awake/alert Orientation Level: Oriented to person, Oriented to place, Disoriented to time, Disoriented to situation, Other (comment)(expressive aphasia but nods appropriately) Problem Solving: Appears intact(appears intact for basic functional) Safety/Judgment: Appears intact Comments: difficult to assess due to aphasia Cognition Arousal/Alertness: Awake/alert Behavior During Therapy: WFL for tasks assessed/performed Overall Cognitive Status: Impaired/Different from baseline Area of Impairment: Following commands, Problem solving, Awareness Following Commands: Follows one step commands with increased time, Follows multi-step commands inconsistently Awareness: Emergent Problem Solving: Requires verbal cues General Comments: Pt able to nod yes/no to answer simple questions. Pt requiring increased time for ADLs. Pt followims simple cues  Difficult to assess due to: Impaired communication  Physical Exam: Blood pressure (!) 158/96, pulse 94, temperature 98.1 F (36.7 C), temperature source Oral, resp. rate 18, weight 83.7 kg, SpO2 91 %. Physical Exam  Constitutional: He appears well-developed.  obese  HENT:  Head: Normocephalic and atraumatic.  Eyes: Pupils are equal,  round, and reactive to light. Left eye exhibits no discharge.  Neck: Normal range of motion.  Cardiovascular: Normal rate. Exam reveals no friction rub.  No murmur heard. Respiratory: Effort normal. No respiratory distress. He has no wheezes. He has no rales.  GI: Soft. He exhibits no distension. There is no abdominal tenderness.  Musculoskeletal:     Comments: Lower ext edema 1 + bilaterally  Neurological: He is alert.  Patient is alert he is aphasic expressive greater than receptive. Unable to stick out tongue or hold up two fingers. Could respond with head nods yes/no fairly accurately. Speech very dysarthric. RUE with flexor tone, biceps 2-3/4. Minimal voluntary movement RUE. RLE 2/5. DTR's 3+ RUE and RLE. Decreased sense to pain RUE and RLE.  LUE and LLE 4-5/5.   Skin: Skin is warm and dry.  Psychiatric: He has a normal mood and affect. His behavior is normal.    Results for orders placed or performed during the hospital encounter of 04/14/19 (from the past 48 hour(s))  Potassium     Status: Abnormal   Collection Time: 04/16/19  9:01 AM  Result Value Ref Range   Potassium 6.0 (H) 3.5 - 5.1 mmol/L    Comment: Performed at [facility], [address], [place], [place] [address]  Basic metabolic panel     Status: Abnormal   Collection Time: 04/16/19 12:00 PM  Result Value Ref Range   Sodium 142 135 - 145 mmol/L   Potassium 5.6 (H) 3.5 - 5.1 mmol/L   Chloride 105 98 - 111 mmol/L   CO2 23 22 - 32 mmol/L   Glucose, Bld 108 (H) 70 -  99 mg/dL   BUN 84 (H) 8 - 23 mg/dL   Creatinine, Ser 1.61 (H) 0.61 - 1.24 mg/dL   Calcium 9.4 8.9 - 09.6 mg/dL   GFR calc non Af Amer 13 (L) >60 mL/min   GFR calc Af Amer 15 (L) >60 mL/min   Anion gap 14 5 - 15    Comment: Performed at [facility], [address] [place], [place] [address]  CBC     Status: Abnormal   Collection Time: 04/17/19  4:49 AM  Result Value Ref Range   WBC 8.1 4.0 - 10.5 K/uL   RBC 3.24 (L) 4.22 - 5.81 MIL/uL    Hemoglobin 9.1 (L) 13.0 - 17.0 g/dL   HCT 98.1 (L) 19.1 - 47.8 %   MCV 95.1 80.0 - 100.0 fL   MCH 28.1 26.0 - 34.0 pg   MCHC 29.5 (L) 30.0 - 36.0 g/dL   RDW 29.5 62.1 - 30.8 %   Platelets 219 150 - 400 K/uL   nRBC 0.0 0.0 - 0.2 %    Comment: Performed at [facility], [address] [place], [place] [address]  Comprehensive metabolic panel     Status: Abnormal   Collection Time: 04/17/19  4:49 AM  Result Value Ref Range   Sodium 143 135 - 145 mmol/L   Potassium 5.5 (H) 3.5 - 5.1 mmol/L   Chloride 104 98 - 111 mmol/L   CO2 26 22 - 32 mmol/L   Glucose, Bld 95 70 - 99 mg/dL   BUN 80 (H) 8 - 23 mg/dL   Creatinine, Ser 6.96 (H) 0.61 - 1.24 mg/dL   Calcium 8.8 (L) 8.9 - 10.3 mg/dL   Total Protein 6.8 6.5 - 8.1 g/dL   Albumin 2.5 (L) 3.5 - 5.0 g/dL   AST 82 (H) 15 - 41 U/L   ALT 104 (H) 0 - 44 U/L   Alkaline Phosphatase 119 38 - 126 U/L   Total Bilirubin 0.5 0.3 - 1.2 mg/dL   GFR calc non Af Amer 14 (L) >60 mL/min   GFR calc Af Amer 16 (L) >60 mL/min   Anion gap 13 5 - 15    Comment: Performed at [facility], [address], [place], [place] [address]  Potassium     Status: Abnormal   Collection Time: 04/17/19  1:00 PM  Result Value Ref Range   Potassium 6.0 (H) 3.5 - 5.1 mmol/L    Comment: Performed at [facility], [address] [place], [place] [address]  Comprehensive metabolic panel     Status: Abnormal   Collection Time: 04/18/19  2:51 AM  Result Value Ref Range   Sodium 140 135 - 145 mmol/L   Potassium 5.8 (H) 3.5 - 5.1 mmol/L   Chloride 104 98 - 111 mmol/L   CO2 24 22 - 32 mmol/L   Glucose, Bld 96 70 - 99 mg/dL   BUN 75 (H) 8 - 23 mg/dL   Creatinine, Ser 4.01 (H) 0.61 - 1.24 mg/dL   Calcium 9.0 8.9 - 02.7 mg/dL   Total Protein 7.0 6.5 - 8.1 g/dL   Albumin 2.6 (L) 3.5 - 5.0 g/dL   AST 74 (H) 15 - 41 U/L   ALT 94 (H) 0 - 44 U/L   Alkaline Phosphatase 130 (H) 38 - 126 U/L   Total Bilirubin 0.5 0.3 - 1.2 mg/dL   GFR calc non Af Amer 14 (L) >60  mL/min   GFR  calc Af Amer 17 (L) >60 mL/min   Anion gap 12 5 - 15    Comment: Performed at [facility], [address] [place], [place] [address]  CBC     Status: Abnormal   Collection Time: 04/18/19  2:51 AM  Result Value Ref Range   WBC 9.1 4.0 - 10.5 K/uL   RBC 3.23 (L) 4.22 - 5.81 MIL/uL   Hemoglobin 9.1 (L) 13.0 - 17.0 g/dL   HCT 78.5 (L) 88.5 - 02.7 %   MCV 94.4 80.0 - 100.0 fL   MCH 28.2 26.0 - 34.0 pg   MCHC 29.8 (L) 30.0 - 36.0 g/dL   RDW 74.1 28.7 - 86.7 %   Platelets 230 150 - 400 K/uL   nRBC 0.0 0.0 - 0.2 %    Comment: Performed at [facility], [address] [place], [place] [address]   Ct Abdomen Pelvis Wo Contrast  Result Date: 04/16/2019 CLINICAL DATA:  Acute kidney injury, bilateral hydronephrosis and bladder outlet obstruction. EXAM: CT ABDOMEN AND PELVIS WITHOUT CONTRAST TECHNIQUE: Multidetector CT imaging of the abdomen and pelvis was performed following the standard protocol without IV contrast. COMPARISON:  CT of the abdomen and pelvis without contrast on 04/06/2019 and renal ultrasound on 04/05/2019. FINDINGS: Lower chest: No acute abnormality. Hepatobiliary: The liver is unremarkable. The gallbladder is nondistended and likely contains some tiny dependent calculi. No evidence of biliary ductal dilatation. Pancreas: Unremarkable. No pancreatic ductal dilatation or surrounding inflammatory changes. Spleen: Normal in size without focal abnormality. Adrenals/Urinary Tract: The kidneys demonstrate no hydronephrosis. The ureters are nondilated. No calculi identified. The bladder is largely decompressed by a Foley catheter. There is evidence persistent circumferential wall thickening likely on the basis of chronic outlet obstruction. Adrenal glands remain unremarkable bilaterally. Stomach/Bowel: Bowel shows no evidence of obstruction, ileus, inflammation or lesion. No free air identified. Vascular/Lymphatic: Calcified plaque throughout much of the abdominal  aorta, visceral arteries and iliac arteries as well as the common femoral arteries. No evidence of aneurysmal disease. No enlarged lymph nodes identified in the abdomen or pelvis. Reproductive: The prostate gland appears mild to moderately enlarged. Other: No abdominal wall hernia or abnormality. No abdominopelvic ascites. Musculoskeletal: No acute or significant osseous findings. IMPRESSION: 1. No evidence of recurrent hydronephrosis. The bladder is decompressed by a Foley catheter and demonstrates persistent circumferential wall thickening, likely on the basis of chronic outlet obstruction. 2. Probable small dependent calculi in the gallbladder. Electronically Signed   By: Irish Lack M.D.   On: 04/16/2019 12:29       Medical Problem List and Plan: 1.  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  Status post loop recorder  -admit to inpatient rehab 2.  Antithrombotics: -DVT/anticoagulation: Subcutaneous heparin.  -antiplatelet therapy: Aspirin 325 mg daily, Plavix 75 mg daily 3. Pain Management: Tylenol as needed 4. Mood: Provide emotional support  -antipsychotic agents: N/A 5. Neuropsych: This patient is capable of making decisions on his own behalf. 6. Skin/Wound Care: Routine skin checks 7. Fluids/Electrolytes/Nutrition: Routine in and outs with follow-up chemistries 8.  Dysphagia.  Dysphagia #1 honey thick liquids.  Monitor hydration/labs. 9.  Acute on chronic kidney disease stage III/hyperkalemia with obstructive uropathy/BPH.  Follow-up urology services as well as nephrology services.  -FOLEY TUBE TO REMAIN IN PLACE UNTIL FOLLOW UP OUT PATIENT WITH UROLOGY 10.  Hyperlipidemia.  Lipitor 11.  Hypertension.  Lopressor 25 mg twice daily.  Monitor with increased  mobility 12.  OSA.  Continue CPAP      Charlton Amor, PA-C 04/18/2019

## 2019-04-16 NOTE — Progress Notes (Signed)
Pt had a 5.9 potassium level and Foster, MD (nephrologist) was notified and she ordered for the patient to receive a Kayexalate enema. Pt received Kayexalate enema with minimal results. Pt tolerated enema well. Will continue to monitor and treat per MD orders.

## 2019-04-16 NOTE — Progress Notes (Signed)
PROGRESS NOTE    Gregory [name]  OTL:[id number] DOB: 07-17-1951 DOA: 04/14/2019 PCP: System, Pcp Not In   Brief Narrative: Gregory [name] a 68 y.o.malewith medical history significant forCKDIII, hypertension, and hyperlipidemia who was admitted to the hospital on 04/05/2019 with acute onset of expressive aphasia and right-sided weakness, and was admitted with acute CVA. He was also found to have acute kidney injury on admission with hyperkalemia, and the admission was alsocomplicated by NSTEMI.Marland Kitchen He was discharged to inpatient rehabilitation on 04/10/2019, to continue with rehabilitation when he developed hypokalemia.  Nephrology was following and attempted to treat hyperkalemia while in rehab. They were advised that patient need to be readmitted in the hospital for treatment of hyperkalemia.  Assessment & Plan:   Principal Problem:   Hyperkalemia Active Problems:   Acute renal failure superimposed on stage 3 chronic kidney disease (HCC)   Essential hypertension   Stroke (cerebrum) (HCC)   Multifocal atrial tachycardia (HCC)   Embolic cerebral infarction (HCC)   Bladder outlet obstruction   #1.  Hyperkalemia without EKG changes.  He is status post Kayexalate enema, insulin and  dextrose.  He also received a dose of Lasix yesterday.  Patient received Lasix today and bicarb x1 with IV fluids. Recheck of potassium was 5.6.  Patient's nurse admitted that lab was drawn prior to bicarb administration. We will recheck potassium in the morning. Nephrology following.  #2.  Acute kidney injury superimposed on chronic kidney disease stage III.  Patient was found to have bilateral hydronephrosis due to bladder outlet obstruction.  He status post Foley catheter placement with gradual improvement in renal function back to baseline. Nephrology is following  #3.  Cerebrovascular accident (stroke-04/06/2019) with acute aphasia and right hemiparesis.  Currently undergoing rehabilitation  inpatient. Patient has significant aphasia and dysphagia. Able to swallow medications with applesauce and comprehensible but unable to verbalize. Continue with aspirin, Plavix and statin secondary stroke prevention Continue with PT/OT  #4.  Multifactorial atrial tachycardia controlled with metoprolol  #5.  Hypertension BP currently at goal Continue with the hypertensive medication-metoprolol  #6. Anemia of chronic kidney disease Continue to monitor CBC and transfuse with hemoglobin less than 7.0  #7. Obstructive sleep apnea CPAP nightly and PRN.  #8.  Bladder outlet obstruction with resultant bilateral hydronephrosis.  Status post Foley catheter placement.   DVT prophylaxis:Heparin subcute   Code Status: Full   Family Communication: None  Disposition Plan: Pending improvement in hyperkalemia   Consultants:   Nephrology  Procedures:None  Antimicrobials:None  Subjective: Patient was seen and evaluated at bedside.  No new complaints today.  Still hyperkalemic.  With right hemiparesis.  Patient comprehends well but still phasic.  Will continue to monitor serum potassium and plan to discharge as soon as potassium is normalized.  Objective: Vitals:   04/15/19 2115 04/16/19 0520 04/16/19 0548 04/16/19 1037  BP: 102/81 (!) 154/133 105/78 (!) 124/58  Pulse: 75 (!) 106 88 (!) 108  Resp: 18 19    Temp: 97.8 F (36.6 C) 97.9 F (36.6 C)    TempSrc: Oral Oral    SpO2: (!) 89% 100%      Intake/Output Summary (Last 24 hours) at 04/16/2019 1452 Last data filed at 04/16/2019 1034 Gross per 24 hour  Intake 373.2 ml  Output 2500 ml  Net -2126.8 ml   There were no vitals filed for this visit.  Examination:  General exam: Appears calm and comfortable  Respiratory system: Clear to auscultation. Respiratory effort normal. Cardiovascular system: S1 &  S2 heard, RRR. No JVD, murmurs, rubs, gallops or clicks. No pedal edema. Gastrointestinal system: Abdomen is nondistended,  soft and nontender. No organomegaly or masses felt. Normal bowel sounds heard. Central nervous system: Alert, aphasic but able to comprehend.  Right hemiparesis  Extremities: Right hemiparesis Skin: No rashes, lesions or ulcers Psychiatry: Judgement and insight appear normal. Mood & affect appropriate.     Data Reviewed: I have personally reviewed following labs and imaging studies  CBC: Recent Labs  Lab 04/10/19 0518 04/11/19 0610 04/13/19 0517 04/15/19 0549 04/16/19 0341  WBC 10.9* 10.2 9.2 7.9 9.5  NEUTROABS  --  6.7  --  4.5  --   HGB 10.0* 9.6* 9.4* 10.3* 10.0*  HCT 33.5* 32.5* 32.3* 33.7* 33.2*  MCV 93.6 93.9 96.4 94.9 95.4  PLT 183 169 182 190 220   Basic Metabolic Panel: Recent Labs  Lab 04/13/19 0517  04/14/19 0725  04/14/19 2207 04/15/19 0549 04/15/19 1344 04/15/19 2109 04/16/19 0341 04/16/19 0901 04/16/19 1200  NA 144  --  143   < > 140 141 140  --  143  --  142  K 6.0*   < > 5.7*   < > 6.3* 6.0* 6.0* 5.9* 6.0* 6.0* 5.6*  CL 106  --  103   < > 103 103 100  --  104  --  105  CO2 26  --  26   < > --  27  --  23  GLUCOSE 95  --  94   < > 168* 94 118*  --  104*  --  108*  BUN 86*  --  80*   < > 82* 82* 85*  --  83*  --  84*  CREATININE 4.34*  --  3.94*   < > 4.03* 4.02* 4.33*  --  4.34*  --  4.24*  CALCIUM 8.5*  --  9.3   < > 9.0 9.5 9.6  --  9.2  --  9.4  MG  --   --   --   --   --   --   --   --  1.8  --   --   PHOS 5.4*  --  5.4*  --   --   --   --   --   --   --   --    < > = values in this interval not displayed.   GFR: Estimated Creatinine Clearance: 16.3 mL/min (A) (by C-G formula based on SCr of 4.24 mg/dL (H)). Liver Function Tests: Recent Labs  Lab 04/11/19 0610 04/13/19 0517 04/14/19 0725 04/16/19 0341  AST 144*  --   --  90*  ALT 75*  --   --  112*  ALKPHOS 94  --   --  131*  BILITOT 0.5  --   --  0.5  PROT 6.9  --   --  7.3  ALBUMIN 2.7* 2.6* 2.8* 2.7*   No results for input(s): LIPASE, AMYLASE in the last 168 hours. No  results for input(s): AMMONIA in the last 168 hours. Coagulation Profile: No results for input(s): INR, PROTIME in the last 168 hours. Cardiac Enzymes: No results for input(s): CKTOTAL, CKMB, CKMBINDEX, TROPONINI in the last 168 hours. BNP (last 3 results) No results for input(s): PROBNP in the last 8760 hours. HbA1C: No results for input(s): HGBA1C in the last 72 hours. CBG: Recent Labs  Lab 04/15/19 2034 04/15/19 2324 04/16/19 0527  GLUCAP 112*  80 84   Lipid Profile: No results for input(s): CHOL, HDL, LDLCALC, TRIG, CHOLHDL, LDLDIRECT in the last 72 hours. Thyroid Function Tests: No results for input(s): TSH, T4TOTAL, FREET4, T3FREE, THYROIDAB in the last 72 hours. Anemia Panel: No results for input(s): VITAMINB12, FOLATE, FERRITIN, TIBC, IRON, RETICCTPCT in the last 72 hours. Sepsis Labs: No results for input(s): PROCALCITON, LATICACIDVEN in the last 168 hours.  No results found for this or any previous visit (from the past 240 hour(s)).       Radiology Studies: Ct Abdomen Pelvis Wo Contrast  Result Date: 04/16/2019 CLINICAL DATA:  Acute kidney injury, bilateral hydronephrosis and bladder outlet obstruction. EXAM: CT ABDOMEN AND PELVIS WITHOUT CONTRAST TECHNIQUE: Multidetector CT imaging of the abdomen and pelvis was performed following the standard protocol without IV contrast. COMPARISON:  CT of the abdomen and pelvis without contrast on 04/06/2019 and renal ultrasound on 04/05/2019. FINDINGS: Lower chest: No acute abnormality. Hepatobiliary: The liver is unremarkable. The gallbladder is nondistended and likely contains some tiny dependent calculi. No evidence of biliary ductal dilatation. Pancreas: Unremarkable. No pancreatic ductal dilatation or surrounding inflammatory changes. Spleen: Normal in size without focal abnormality. Adrenals/Urinary Tract: The kidneys demonstrate no hydronephrosis. The ureters are nondilated. No calculi identified. The bladder is largely  decompressed by a Foley catheter. There is evidence persistent circumferential wall thickening likely on the basis of chronic outlet obstruction. Adrenal glands remain unremarkable bilaterally. Stomach/Bowel: Bowel shows no evidence of obstruction, ileus, inflammation or lesion. No free air identified. Vascular/Lymphatic: Calcified plaque throughout much of the abdominal aorta, visceral arteries and iliac arteries as well as the common femoral arteries. No evidence of aneurysmal disease. No enlarged lymph nodes identified in the abdomen or pelvis. Reproductive: The prostate gland appears mild to moderately enlarged. Other: No abdominal wall hernia or abnormality. No abdominopelvic ascites. Musculoskeletal: No acute or significant osseous findings. IMPRESSION: 1. No evidence of recurrent hydronephrosis. The bladder is decompressed by a Foley catheter and demonstrates persistent circumferential wall thickening, likely on the basis of chronic outlet obstruction. 2. Probable small dependent calculi in the gallbladder. Electronically Signed   By: Irish LackGlenn  Yamagata M.D.   On: 04/16/2019 12:29        Scheduled Meds:  aspirin  300 mg Rectal Daily   Or   aspirin  325 mg Oral Daily   atorvastatin  80 mg Oral q1800   clopidogrel  75 mg Oral Daily   dextrose  1 ampule Intravenous Once   heparin  5,000 Units Subcutaneous Q8H   insulin aspart  5 Units Intravenous Once   mouth rinse  15 mL Mouth Rinse BID   metoprolol tartrate  25 mg Oral BID   sodium chloride flush  3 mL Intravenous Q12H   sodium chloride flush  3 mL Intravenous Q12H   Continuous Infusions:  sodium chloride 100 mL/hr at 04/16/19 1250   sodium chloride       LOS: 1 day      Aquilla HackerFelix O Margarethe Virgen, MD [facility] Pager [phone]  If 7PM-7AM, please contact night-coverage [email or web address] Password [email or web address] 04/16/2019, 2:52 PM

## 2019-04-16 NOTE — Progress Notes (Signed)
Nurse notified Dr foster of patients blood sugar in relation to insulin and dextrose order r/t potassium per doctors prior request. Dr stated to not give dextrose and insulin related to bg of 84.

## 2019-04-16 NOTE — Progress Notes (Addendum)
Patient ID: Gregory [name], male   DOB: Mar 02, 1951, 68 y.o.   MRN: [id number] [facility] Progress Note   Assessment/ Plan:   #  Acute kidney injury on chronic kidney disease stage III:  With bladder outlet obstruction/obstructive uropathy.  It is highly likely that he has had this obstruction for >2 weeks and may have significant residual renal injury - leave indwelling Foley until seen by urology as outpatient - Nonoliguric and had been improving with supportive care.  Course complicated by recurrent hyperkalemia  - Continue IV fluids - changing to 1/2 NS  - Obtain CT abdomen/pelvis  - Will need dialysis should he continue with refractory hyperkalemia.  May have reached a plateau with his recovery - Repeat BMP  # Hyperkalemia   - Eating more and nurses report unable to restrict K+ content in diet.  However he may have reached a plateau with his AKI recovery - had a kayexalate enema this AM (2:30) with labs shortly thereafter - Lasix IV once    - bicarbonate IV once now  - Continue IV fluids  - repeat potassium  --low potassium diet restriction (difficult with his dietary needs due to dysphagia).  The nurse have a sign up with high K foods and are going to be helping him with ordering  # Hypernatremia: Limited access to free water due to dysphagia precautions.  Free water deficit resolving  # Hypertension: acceptable   # Anemia: stable; Replete iron stores; has had resolution of hematuria.   # Elevated troponin levels: Trending down last eval  # Acute CVA with right-sided weakness/expressive aphasia: s/p speech eval and for rehab   Subjective:   Spoke with RN overnight.  He had a kayexalate enema around 2:30 am.  His labs were drawn after that.  They found that he had been eating pudding which they report had a high potassium content.  He had 1.7 liters UOP over 4/25.  He has continued on normal saline at 100 ml/hr.   Review of systems:   Limited with dysphagia but  nods or shakes head to questions Denies shortness of breath  Denies any chest pain Denies abdominal pain Denies nausea/vomiting Reports BM overnight after enema   Objective:   BP 105/78 (BP Location: Left Arm)   Pulse 88   Temp 97.9 F (36.6 C) (Oral)   Resp 19   SpO2 100%   Intake/Output Summary (Last 24 hours) at 04/16/2019 5248 Last data filed at 04/16/2019 0600 Gross per 24 hour  Intake 493.2 ml  Output 1675 ml  Net -1181.8 ml   Weight change:   Physical Exam:  Gen: elderly male in bed in NAD, smiling  CVS: RRR; S1, S2 Resp: clear to auscultation bilaterally and normal work of breathing  Abd: obese habitus; softly distended; nontender.   Ext: no lower extremity edema    Neuro: Aphasia; awake and interactive.  Nods and shakes head but mostly nonverbal  GU foley intact   Imaging: No results found.  Labs: BMET Recent Labs  Lab 04/13/19 0517  04/14/19 0725 04/14/19 1618 04/14/19 1806 04/14/19 2207 04/15/19 0549 04/15/19 1344 04/15/19 2109 04/16/19 0341  NA 144  --  143  --  140 140 141 140  --  143  K 6.0*   < > 5.7* 6.7* 6.3* 6.3* 6.0* 6.0* 5.9* 6.0*  CL 106  --  103  --  103 103 103 100  --  104  CO2 26  --  26  --  24 24 23 25   --  27  GLUCOSE 95  --  94  --  85 168* 94 118*  --  104*  BUN 86*  --  80*  --  78* 82* 82* 85*  --  83*  CREATININE 4.34*  --  3.94*  --  4.05* 4.03* 4.02* 4.33*  --  4.34*  CALCIUM 8.5*  --  9.3  --  9.3 9.0 9.5 9.6  --  9.2  PHOS 5.4*  --  5.4*  --   --   --   --   --   --   --    < > = values in this interval not displayed.   CBC Recent Labs  Lab 04/11/19 0610 04/13/19 0517 04/15/19 0549 04/16/19 0341  WBC 10.2 9.2 7.9 9.5  NEUTROABS 6.7  --  4.5  --   HGB 9.6* 9.4* 10.3* 10.0*  HCT 32.5* 32.3* 33.7* 33.2*  MCV 93.9 96.4 94.9 95.4  PLT 169 182 190 220    Medications:    . aspirin  300 mg Rectal Daily   Or  . aspirin  325 mg Oral Daily  . atorvastatin  80 mg Oral q1800  . clopidogrel  75 mg Oral Daily  .  dextrose  1 ampule Intravenous Once  . heparin  5,000 Units Subcutaneous Q8H  . insulin aspart  5 Units Intravenous Once  . mouth rinse  15 mL Mouth Rinse BID  . metoprolol tartrate  25 mg Oral BID  . sodium chloride flush  3 mL Intravenous Q12H  . sodium chloride flush  3 mL Intravenous Q12H   Estanislado EmmsLori C  04/16/2019

## 2019-04-16 NOTE — Care Management Obs Status (Signed)
MEDICARE OBSERVATION STATUS NOTIFICATION   Patient Details  Name: Gregory [name] MRN: [id number] Date of Birth: 05/26/1951   Medicare Observation Status Notification Given:  Yes  Emailed obs letter to patients son.   Averiana Clouatre, LCSW 04/16/2019, 9:10 AM

## 2019-04-17 ENCOUNTER — Inpatient Hospital Stay (HOSPITAL_COMMUNITY): Payer: Medicare HMO

## 2019-04-17 ENCOUNTER — Ambulatory Visit: Payer: Medicare HMO

## 2019-04-17 ENCOUNTER — Inpatient Hospital Stay (HOSPITAL_COMMUNITY): Payer: Self-pay

## 2019-04-17 ENCOUNTER — Inpatient Hospital Stay (HOSPITAL_COMMUNITY): Payer: Medicare HMO | Admitting: Occupational Therapy

## 2019-04-17 LAB — COMPREHENSIVE METABOLIC PANEL
ALT: 104 U/L — ABNORMAL HIGH (ref 0–44)
AST: 82 U/L — ABNORMAL HIGH (ref 15–41)
Albumin: 2.5 g/dL — ABNORMAL LOW (ref 3.5–5.0)
Alkaline Phosphatase: 119 U/L (ref 38–126)
Anion gap: 13 (ref 5–15)
BUN: 80 mg/dL — ABNORMAL HIGH (ref 8–23)
CO2: 26 mmol/L (ref 22–32)
Calcium: 8.8 mg/dL — ABNORMAL LOW (ref 8.9–10.3)
Chloride: 104 mmol/L (ref 98–111)
Creatinine, Ser: 4.08 mg/dL — ABNORMAL HIGH (ref 0.61–1.24)
GFR calc Af Amer: 16 mL/min — ABNORMAL LOW (ref >60)
GFR calc non Af Amer: 14 mL/min — ABNORMAL LOW (ref >60)
Glucose, Bld: 95 mg/dL (ref 70–99)
Potassium: 5.5 mmol/L — ABNORMAL HIGH (ref 3.5–5.1)
Sodium: 143 mmol/L (ref 135–145)
Total Bilirubin: 0.5 mg/dL (ref 0.3–1.2)
Total Protein: 6.8 g/dL (ref 6.5–8.1)

## 2019-04-17 LAB — CBC
HCT: 30.8 % — ABNORMAL LOW (ref 39.0–52.0)
Hemoglobin: 9.1 g/dL — ABNORMAL LOW (ref 13.0–17.0)
MCH: 28.1 pg (ref 26.0–34.0)
MCHC: 29.5 g/dL — ABNORMAL LOW (ref 30.0–36.0)
MCV: 95.1 fL (ref 80.0–100.0)
Platelets: 219 10*3/uL (ref 150–400)
RBC: 3.24 MIL/uL — ABNORMAL LOW (ref 4.22–5.81)
RDW: 13.2 % (ref 11.5–15.5)
WBC: 8.1 10*3/uL (ref 4.0–10.5)
nRBC: 0 % (ref 0.0–0.2)

## 2019-04-17 LAB — POTASSIUM: Potassium: 6 mmol/L — ABNORMAL HIGH (ref 3.5–5.1)

## 2019-04-17 MED ORDER — PATIROMER SORBITEX CALCIUM 8.4 G PO PACK
8.4000 g | PACK | Freq: Every day | ORAL | Status: DC
  Administered 2019-04-17: 8.4 g via ORAL
  Filled 2019-04-17 (×2): qty 1

## 2019-04-17 NOTE — Progress Notes (Signed)
Physical Therapy Treatment Patient Details Name: Gregory [name] MRN: [id number] DOB: 1951/10/30 Today's Date: 04/17/2019    History of Present Illness Pt is a 68 y.o. male initially admitted 04/05/19 with expressive aphasia and R-side weakness due to acute CVA (hospital course complicated by NSTEMI and AKI) with d/c to CIR on 04/10/19, [facility]. PMH includes CAD, HTN, OSA on CPAP; left insular and posterior frontal infarct.    PT Comments    Continuing work on functional mobility and activity tolerance;  Pt performing sit<>stand with use of stedy with Min-Mod A+2; assistance level changing due to fatigue as session progressed. Pt performing grooming at sink while seated on stedy and requiring Mod-Max A for sitting balance due to right lateral lean. Pt able to correct posture with cues and during seated rest breaks at sink. Continue to recommend dc to CIR and will continue to follow acutely as admitted.   Follow Up Recommendations  CIR;Supervision for mobility/OOB     Equipment Recommendations  Other (comment)(to be determined)    Recommendations for Other Services       Precautions / Restrictions Precautions Precautions: Fall Precaution Comments: Expressive aphasia, R hemiparesis Restrictions Weight Bearing Restrictions: No    Mobility  Bed Mobility Overal bed mobility: Needs Assistance Bed Mobility: Supine to Sit     Supine to sit: Mod assist;+2 for safety/equipment     General bed mobility comments: Mod A for bringing BLEs towards EOB. Pt pushing into upright posture with Min A and then Mod A to weight bear through RUE  Transfers Overall transfer level: Needs assistance Equipment used: 2 person hand held assist Transfers: Sit to/from Stand Sit to Stand: Mod assist;+2 physical assistance;Min assist         General transfer comment: Min A +2 from EOB with use of stedy. After grooming at sink, pt fatigued and requiring Mod A +2  to stand from stedy and transfer to recliner.   Ambulation/Gait                 Stairs             Wheelchair Mobility    Modified Rankin (Stroke Patients Only) Modified Rankin (Stroke Patients Only) Pre-Morbid Rankin Score: No symptoms Modified Rankin: Severe disability     Balance Overall balance assessment: Needs assistance Sitting-balance support: Feet supported;Single extremity supported Sitting balance-Leahy Scale: Fair Sitting balance - Comments: assist required initially and then able to maintain sitting balance EOB with min guard Postural control: Right lateral lean Standing balance support: Bilateral upper extremity supported Standing balance-Leahy Scale: Poor Standing balance comment: Reliant on UE support and physical A; fatigues quickly, especially when participateing in ADL                            Cognition Arousal/Alertness: Awake/alert Behavior During Therapy: WFL for tasks assessed/performed Overall Cognitive Status: Impaired/Different from baseline Area of Impairment: Following commands;Problem solving;Awareness                       Following Commands: Follows one step commands with increased time;Follows multi-step commands inconsistently   Awareness: Emergent Problem Solving: Requires verbal cues General Comments: Pt able to nod yes/no to answer simple questions. Pt requiring increased time for ADLs. Pt followims simple cues       Exercises      General Comments General comments (skin integrity, edema, etc.): SpO2 >94% on RA  Pertinent Vitals/Pain Pain Assessment: Faces Faces Pain Scale: Hurts little more Pain Location: R knee with pressure Pain Descriptors / Indicators: Grimacing Pain Intervention(s): Monitored during session    Home Living                      Prior Function            PT Goals (current goals can now be found in the care plan section) Acute Rehab PT Goals Patient  Stated Goal: Agreeable for continued rehab PT Goal Formulation: With patient Time For Goal Achievement: 04/29/19 Potential to Achieve Goals: Good Progress towards PT goals: Progressing toward goals    Frequency    Min 4X/week      PT Plan Current plan remains appropriate    Co-evaluation PT/OT/SLP Co-Evaluation/Treatment: Yes Reason for Co-Treatment: Complexity of the patient's impairments (multi-system involvement) PT goals addressed during session: Mobility/safety with mobility OT goals addressed during session: ADL's and self-care      AM-PAC PT "6 Clicks" Mobility   Outcome Measure  Help needed turning from your back to your side while in a flat bed without using bedrails?: A Lot Help needed moving from lying on your back to sitting on the side of a flat bed without using bedrails?: A Lot Help needed moving to and from a bed to a chair (including a wheelchair)?: A Lot Help needed standing up from a chair using your arms (e.g., wheelchair or bedside chair)?: A Lot Help needed to walk in hospital room?: A Lot Help needed climbing 3-5 steps with a railing? : Total 6 Click Score: 11    End of Session Equipment Utilized During Treatment: Gait belt Activity Tolerance: Patient tolerated treatment well Patient left: in chair;with call bell/phone within reach;with chair alarm set Nurse Communication: Mobility status;Need for lift equipment PT Visit Diagnosis: Other abnormalities of gait and mobility (R26.89);Hemiplegia and hemiparesis Hemiplegia - Right/Left: Right Hemiplegia - dominant/non-dominant: Non-dominant Hemiplegia - caused by: Cerebral infarction     Time: 3568-6168 PT Time Calculation (min) (ACUTE ONLY): 25 min  Charges:  $Therapeutic Activity: 8-22 mins                     Van Clines, PT  Acute Rehabilitation Services Pager [phone] Office [phone]    Gregory [name] 04/17/2019, 10:37 AM

## 2019-04-17 NOTE — Progress Notes (Signed)
Inpatient Rehabilitation-Admissions Coordinator   [facility] will begin insurance authorization for possible re-admit back to CIR.   Will follow up once insurance determination has been made.   Nanine Means, OTR/L  Rehab Admissions Coordinator  [phone] 04/17/2019 2:52 PM

## 2019-04-17 NOTE — Progress Notes (Signed)
Occupational Therapy Treatment Patient Details Name: Gregory [name] MRN: [id number] DOB: 1951-10-02 Today's Date: 04/17/2019    History of present illness Pt is a 68 y.o. male initially admitted 04/05/19 with expressive aphasia and R-side weakness due to acute CVA (hospital course complicated by NSTEMI and AKI) with d/c to CIR on 04/10/19, now readmitted from [facility] 04/14/19 with hyperkalemia. PMH includes CAD, HTN, OSA on CPAP; left insular and posterior frontal infarct.   OT comments  Pt progressing towards established OT goals and demonstrates high motivated to participate in therapy. Pt performing sit<>stand with use of stedy with Min-Mod A+2; assistance level changing due to fatigue as session progressed. Pt performing grooming at sink while seated on stedy and requiring Mod-Max A for sitting balance due to right lateral lean. Pt able to correct posture with cues and during seated rest breaks at sink. Continue to recommend dc to CIR and will continue to follow acutely as admitted.   Follow Up Recommendations  CIR;Supervision/Assistance - 24 hour    Equipment Recommendations  None recommended by OT    Recommendations for Other Services Rehab consult    Precautions / Restrictions Precautions Precautions: Fall Precaution Comments: Expressive aphasia, R hemiparesis Restrictions Weight Bearing Restrictions: No       Mobility Bed Mobility Overal bed mobility: Needs Assistance Bed Mobility: Supine to Sit     Supine to sit: Mod assist;+2 for safety/equipment     General bed mobility comments: Mod A for bringing BLEs towards EOB. Pt pushing into upright posture with Min A and then Mod A to weight bear through RUE  Transfers Overall transfer level: Needs assistance   Transfers: Sit to/from Stand Sit to Stand: Mod assist;+2 physical assistance;Min assist         General transfer comment: Min A +2 from EOB with use of stedy. After grooming at sink, pt fatigued and requiring Mod A  +2 to stand from stedy and transfer to recliner.     Balance Overall balance assessment: Needs assistance Sitting-balance support: Feet supported;Single extremity supported Sitting balance-Leahy Scale: Fair Sitting balance - Comments: assist required initially and then able to maintain sitting balance EOB with min guard Postural control: Right lateral lean Standing balance support: Bilateral upper extremity supported Standing balance-Leahy Scale: Poor Standing balance comment: Reliant on UE support and physical A                           ADL either performed or assessed with clinical judgement   ADL Overall ADL's : Needs assistance/impaired Eating/Feeding: Minimal assistance;Sitting Eating/Feeding Details (indicate cue type and reason): Pt requiring assistance to cut food into bite size pieces. Requiring assistance for bilateral tasks. Pt attempting to bring eggs to mouth with fork and dropping ~25% of food. Cued pt to use hands and eat as a finger food. Pt eating apple sauce with spoon successfully.  Grooming: Wash/dry face;Sitting;Moderate assistance;Maximal assistance(sitting on stedy) Grooming Details (indicate cue type and reason): Pt sitting on stedy at sink and participating in grooming. Challenging sitting balance during ADL. Pt able to sit at sink with Min-Mod A. During ADL, pt requiring Mod A for upright posture with lateral lean right. As he fatigued he required Max A to maintain sitting posture. Pt using LUE throughout and facilitating weight bearing through RUE on sink.              Lower Body Dressing: Maximal assistance Lower Body Dressing Details (indicate cue type and reason): don  socks at bed level Toilet Transfer: Moderate assistance;+2 for physical assistance(sit<>stand with stedy and simulated to recliner) Toilet Transfer Details (indicate cue type and reason): Min A for initial sit<>stand from EOB. As pt fatigued, he required Mod A +2 for sit<>stand from  stedy.          Functional mobility during ADLs: +2 for physical assistance;+2 for safety/equipment;Minimal assistance;Moderate assistance(stedy for transfers) General ADL Comments: Using stedy for pt to perform grooming at sink. Pt sitting up in recliner at end of session and performing self feeding.      Vision   Vision Assessment?: No apparent visual deficits   Perception     Praxis      Cognition Arousal/Alertness: Awake/alert Behavior During Therapy: WFL for tasks assessed/performed Overall Cognitive Status: Impaired/Different from baseline Area of Impairment: Following commands;Problem solving;Awareness                       Following Commands: Follows one step commands with increased time;Follows multi-step commands inconsistently   Awareness: Emergent Problem Solving: Requires verbal cues General Comments: Pt able to nod yes/no to answer simple questions. Pt requiring increased time for ADLs. Pt followims simple cues         Exercises     Shoulder Instructions       General Comments SpO2 >94% on RA    Pertinent Vitals/ Pain       Pain Assessment: Faces Faces Pain Scale: Hurts little more Pain Location: R knee with pressure Pain Descriptors / Indicators: Grimacing Pain Intervention(s): Monitored during session;Limited activity within patient's tolerance;Repositioned  Home Living                                          Prior Functioning/Environment              Frequency  Min 2X/week        Progress Toward Goals  OT Goals(current goals can now be found in the care plan section)  Progress towards OT goals: Progressing toward goals  Acute Rehab OT Goals Patient Stated Goal: Agreeable for continued rehab OT Goal Formulation: Patient unable to participate in goal setting Time For Goal Achievement: 05/07/19 Potential to Achieve Goals: Good ADL Goals Pt Will Perform Grooming: with modified independence;sitting Pt  Will Perform Upper Body Bathing: with mod assist;sitting Pt Will Perform Lower Body Bathing: with mod assist;sit to/from stand Pt Will Transfer to Toilet: with mod assist;stand pivot transfer;bedside commode Pt Will Perform Toileting - Clothing Manipulation and hygiene: with max assist;sit to/from stand Pt/caregiver will Perform Home Exercise Program: Right Upper extremity;With Supervision;With written HEP provided;Increased strength Additional ADL Goal #1: Pt will attend to R side during ADL with minimal cues. Additional ADL Goal #2: Pt will reach for and retrieve items at 60* shoulder flexion with min facilitation  Plan Discharge plan remains appropriate    Co-evaluation    PT/OT/SLP Co-Evaluation/Treatment: Yes Reason for Co-Treatment: Complexity of the patient's impairments (multi-system involvement);For patient/therapist safety;To address functional/ADL transfers   OT goals addressed during session: ADL's and self-care      AM-PAC OT "6 Clicks" Daily Activity     Outcome Measure   Help from another person eating meals?: A Lot Help from another person taking care of personal grooming?: A Little Help from another person toileting, which includes using toliet, bedpan, or urinal?: Total Help from another person bathing (including  washing, rinsing, drying)?: A Lot Help from another person to put on and taking off regular upper body clothing?: A Lot Help from another person to put on and taking off regular lower body clothing?: Total 6 Click Score: 11    End of Session Equipment Utilized During Treatment: Gait belt  OT Visit Diagnosis: Unsteadiness on feet (R26.81);Hemiplegia and hemiparesis Hemiplegia - Right/Left: Right Hemiplegia - dominant/non-dominant: Non-Dominant Hemiplegia - caused by: Cerebral infarction   Activity Tolerance Patient tolerated treatment well   Patient Left in chair;with call bell/phone within reach;with chair alarm set;with nursing/sitter in room    Nurse Communication Mobility status;Precautions        Time: 8016-5537 OT Time Calculation (min): 27 min  Charges: OT General Charges $OT Visit: 1 Visit OT Treatments $Self Care/Home Management : 8-22 mins  Aairah Negrette MSOT, OTR/L Acute Rehab Pager: [phone] Office: [phone]   Gregory [name] 04/17/2019, 9:29 AM

## 2019-04-17 NOTE — Progress Notes (Signed)
PROGRESS NOTE    Gregory [name]  HDQ:[id number] DOB: 21-Dec-1951 DOA: 04/14/2019 PCP: System, Pcp Not In    Brief Narrative:   Assessment & Plan:Gregory [name] a 67 y.o.malewith medical history significant forCKDIII, hypertension, and hyperlipidemia who was admitted to the hospital on 04/05/2019 with acute onset of expressive aphasia and right-sided weakness, and was admitted with acute CVA. He was also found to have acute kidney injury on admission with hyperkalemia, and the admission was alsocomplicated by NSTEMI.Marland Kitchen He was discharged to inpatient rehabilitation on 04/10/2019,to continue with rehabilitation when he developed hypokalemia. Nephrology was following and attempted to treat hyperkalemia while in rehab. Rehab advised that patient need to be readmitted in the hospital for treatment of hyperkalemia.   Principal Problem:   Hyperkalemia Active Problems:   Acute renal failure superimposed on stage 3 chronic kidney disease (HCC)   Essential hypertension   Stroke (cerebrum) (HCC)   Multifocal atrial tachycardia (HCC)   Embolic cerebral infarction (HCC)   Bladder outlet obstruction  #1. Hyperkalemia without EKG changes. He is status post Kayexalate enema, insulin anddextrose. He also received a dose of Lasix  Patient received Lasix and bicarb x1 with IV fluids yesterday.. Recheck of potassium was 5.6 and then this morning at 5.5. Patient received a dose of Veltassa 8.4 g I will continue daily by mouth. Will recheck potassium in the morning. Nephrology on board.  #2. Acute kidney injury superimposed on chronic kidney disease stage III. Patient was found to have bilateral hydronephrosis due to bladder outlet obstruction. He is status post Foley catheter placement with gradual improvement in renal function back to baseline. Nephrology is following.  #3. Cerebrovascular accident (stroke-04/06/2019) with acute aphasia and right hemiparesis. Currently undergoing  rehabilitation inpatient. Patient has significant aphasia and dysphagia. Able to swallow medications with applesauce and comprehensible but unable to verbalize. Continue with aspirin, Plavix and statin secondary stroke prevention Continue with PT/OT He is being reassessed for readmission to inpatient rehab  #4. Multifactorial atrial tachycardia controlled with metoprolol  #5. Hypertension BP currently at goal Continue with  hypertensive medication-metoprolol  #6.Anemia of chronic kidney disease Continue to monitor CBC and transfuse with hemoglobin less than 7.0  #7.Obstructive sleep apnea CPAP nightly and PRN.  #8. Bladder outlet obstruction with resultant bilateral hydronephrosis. Status post Foley catheter placement.      DVT prophylaxis: Heparin subcut  Code Status: Full code   Family Communication:   Disposition Plan:If potassium improves, discharge back to inpatient rehab.   Consultants:   Nephrology   Procedures: None  Antimicrobials: None  Subjective:Patient was seen and evaluated at bedside.  No new complaints today.  Still hyperkalemic in the 5s.  With right hemiparesis.  Patient comprehends well but still phasic.  Will continue to monitor serum potassium and plan to discharge back to inpatient rehab as soon as potassium is normalized.   Objective: Vitals:   04/16/19 1037 04/16/19 1456 04/16/19 2111 04/17/19 0515  BP: (!) 124/58 131/83 (!) 134/55 123/90  Pulse: (!) 108 79 71 88  Resp:  18 20 16   Temp:  98.4 F (36.9 C) (!) 97.5 F (36.4 C) 97.8 F (36.6 C)  TempSrc:  Oral Oral Oral  SpO2:  99% 100% 100%  Weight:    84.3 kg    Intake/Output Summary (Last 24 hours) at 04/17/2019 0755 Last data filed at 04/17/2019 0644 Gross per 24 hour  Intake 1460 ml  Output 1825 ml  Net -365 ml   Filed Weights   04/17/19 0515  Weight: 84.3 kg    Examination: General exam: Appears calm and comfortable  Respiratory system: Clear to auscultation.  Respiratory effort normal. Cardiovascular system: S1 & S2 heard, RRR. No JVD, murmurs, rubs, gallops or clicks. No pedal edema. Gastrointestinal system: Abdomen is nondistended, soft and nontender. No organomegaly or masses felt. Normal bowel sounds heard. Central nervous system: Alert, aphasic but able to comprehend.  Right hemiparesis  Extremities: Right hemiparesis Skin: No rashes, lesions or ulcers Psychiatry: Judgement and insight appear normal. Mood & affect appropriate.     Data Reviewed: I have personally reviewed following labs and imaging studies  CBC: Recent Labs  Lab 04/11/19 0610 04/13/19 0517 04/15/19 0549 04/16/19 0341 04/17/19 0449  WBC 10.2 9.2 7.9 9.5 8.1  NEUTROABS 6.7  --  4.5  --   --   HGB 9.6* 9.4* 10.3* 10.0* 9.1*  HCT 32.5* 32.3* 33.7* 33.2* 30.8*  MCV 93.9 96.4 94.9 95.4 95.1  PLT 169 182 190 220 219   Basic Metabolic Panel: Recent Labs  Lab 04/13/19 0517  04/14/19 0725  04/15/19 0549 04/15/19 1344 04/15/19 2109 04/16/19 0341 04/16/19 0901 04/16/19 1200 04/17/19 0449  NA 144  --  143   < > 141 140  --  143  --  142 143  K 6.0*   < > 5.7*   < > 6.0* 6.0* 5.9* 6.0* 6.0* 5.6* 5.5*  CL 106  --  103   < > 103 100  --  104  --  105 104  CO2 26  --  26   < > 23 25  --  27  --  23 26  GLUCOSE 95  --  94   < > 94 118*  --  104*  --  108* 95  BUN 86*  --  80*   < > 82* 85*  --  83*  --  84* 80*  CREATININE 4.34*  --  3.94*   < > 4.02* 4.33*  --  4.34*  --  4.24* 4.08*  CALCIUM 8.5*  --  9.3   < > 9.5 9.6  --  9.2  --  9.4 8.8*  MG  --   --   --   --   --   --   --  1.8  --   --   --   PHOS 5.4*  --  5.4*  --   --   --   --   --   --   --   --    < > = values in this interval not displayed.   GFR: Estimated Creatinine Clearance: 16.7 mL/min (A) (by C-G formula based on SCr of 4.08 mg/dL (H)). Liver Function Tests: Recent Labs  Lab 04/11/19 0610 04/13/19 0517 04/14/19 0725 04/16/19 0341 04/17/19 0449  AST 144*  --   --  90* 82*  ALT 75*  --    --  112* 104*  ALKPHOS 94  --   --  131* 119  BILITOT 0.5  --   --  0.5 0.5  PROT 6.9  --   --  7.3 6.8  ALBUMIN 2.7* 2.6* 2.8* 2.7* 2.5*   No results for input(s): LIPASE, AMYLASE in the last 168 hours. No results for input(s): AMMONIA in the last 168 hours. Coagulation Profile: No results for input(s): INR, PROTIME in the last 168 hours. Cardiac Enzymes: No results for input(s): CKTOTAL, CKMB, CKMBINDEX, TROPONINI in the last 168 hours. BNP (last 3 results) No  results for input(s): PROBNP in the last 8760 hours. HbA1C: No results for input(s): HGBA1C in the last 72 hours. CBG: Recent Labs  Lab 04/15/19 2034 04/15/19 2324 04/16/19 0527  GLUCAP 112* 80 84   Lipid Profile: No results for input(s): CHOL, HDL, LDLCALC, TRIG, CHOLHDL, LDLDIRECT in the last 72 hours. Thyroid Function Tests: No results for input(s): TSH, T4TOTAL, FREET4, T3FREE, THYROIDAB in the last 72 hours. Anemia Panel: No results for input(s): VITAMINB12, FOLATE, FERRITIN, TIBC, IRON, RETICCTPCT in the last 72 hours. Sepsis Labs: No results for input(s): PROCALCITON, LATICACIDVEN in the last 168 hours.  No results found for this or any previous visit (from the past 240 hour(s)).       Radiology Studies: Ct Abdomen Pelvis Wo Contrast  Result Date: 04/16/2019 CLINICAL DATA:  Acute kidney injury, bilateral hydronephrosis and bladder outlet obstruction. EXAM: CT ABDOMEN AND PELVIS WITHOUT CONTRAST TECHNIQUE: Multidetector CT imaging of the abdomen and pelvis was performed following the standard protocol without IV contrast. COMPARISON:  CT of the abdomen and pelvis without contrast on 04/06/2019 and renal ultrasound on 04/05/2019. FINDINGS: Lower chest: No acute abnormality. Hepatobiliary: The liver is unremarkable. The gallbladder is nondistended and likely contains some tiny dependent calculi. No evidence of biliary ductal dilatation. Pancreas: Unremarkable. No pancreatic ductal dilatation or surrounding  inflammatory changes. Spleen: Normal in size without focal abnormality. Adrenals/Urinary Tract: The kidneys demonstrate no hydronephrosis. The ureters are nondilated. No calculi identified. The bladder is largely decompressed by a Foley catheter. There is evidence persistent circumferential wall thickening likely on the basis of chronic outlet obstruction. Adrenal glands remain unremarkable bilaterally. Stomach/Bowel: Bowel shows no evidence of obstruction, ileus, inflammation or lesion. No free air identified. Vascular/Lymphatic: Calcified plaque throughout much of the abdominal aorta, visceral arteries and iliac arteries as well as the common femoral arteries. No evidence of aneurysmal disease. No enlarged lymph nodes identified in the abdomen or pelvis. Reproductive: The prostate gland appears mild to moderately enlarged. Other: No abdominal wall hernia or abnormality. No abdominopelvic ascites. Musculoskeletal: No acute or significant osseous findings. IMPRESSION: 1. No evidence of recurrent hydronephrosis. The bladder is decompressed by a Foley catheter and demonstrates persistent circumferential wall thickening, likely on the basis of chronic outlet obstruction. 2. Probable small dependent calculi in the gallbladder. Electronically Signed   By: Irish LackGlenn  Yamagata M.D.   On: 04/16/2019 12:29        Scheduled Meds: . aspirin  300 mg Rectal Daily   Or  . aspirin  325 mg Oral Daily  . atorvastatin  80 mg Oral q1800  . clopidogrel  75 mg Oral Daily  . dextrose  1 ampule Intravenous Once  . heparin  5,000 Units Subcutaneous Q8H  . insulin aspart  5 Units Intravenous Once  . mouth rinse  15 mL Mouth Rinse BID  . metoprolol tartrate  25 mg Oral BID  . sodium chloride flush  3 mL Intravenous Q12H  . sodium chloride flush  3 mL Intravenous Q12H   Continuous Infusions: . sodium chloride 100 mL/hr at 04/16/19 2306  . sodium chloride       LOS: 2 days      Aquilla HackerFelix O Mccormick Macon, MD [facility] Pager [phone]  If 7PM-7AM, please contact night-coverage [email or web address] Password [email or web address] 04/17/2019, 7:55 AM

## 2019-04-17 NOTE — Progress Notes (Signed)
Patient ID: Gregory [name], male   DOB: 08-11-1951, 68 y.o.   MRN: [id number] [facility] Progress Note   Assessment/ Plan:   #  Acute kidney injury on chronic kidney disease stage III:  With bladder outlet obstruction/obstructive uropathy.  It is highly likely that he has had this obstruction for >2 weeks and may have significant residual renal injury - leave indwelling Foley until seen by urology as outpatient - Nonoliguric; plateau SCr of around 4 - Continue IV fluids - changing to 1/2 NS  - CT abdomen/pelvis 4/26 w/ decompression of bladder, foley in place, no HN  # Hyperkalemia   - K stable in mid 5s - probably would do best with daily Veltassa if able to take PO - Cont dietary K restriction  # Hypernatremia: resolved  # Hypertension: acceptable   # Anemia: stable; CTM   # Elevated troponin levels: Trending down last eval  # Acute CVA with right-sided weakness/expressive aphasia: s/p speech eval and for rehab   Subjective:    Stable SCr and K in mid 5s  Back to CIR   Objective:   BP 125/62   Pulse 73   Temp 97.8 F (36.6 C) (Oral)   Resp 16   Wt 84.3 kg   SpO2 100%   BMI 31.90 kg/m   Intake/Output Summary (Last 24 hours) at 04/17/2019 1220 Last data filed at 04/17/2019 1044 Gross per 24 hour  Intake 1320 ml  Output 1200 ml  Net 120 ml   Weight change:   Physical Exam:  Gen: elderly male in bed in NAD, smiling  CVS: RRR; S1, S2 Resp: clear to auscultation bilaterally and normal work of breathing  Abd: obese habitus; softly distended; nontender.   Ext: no lower extremity edema    Neuro: Aphasia; awake and interactive.  Nods and shakes head but mostly nonverbal  GU foley intact and draining  Imaging: Ct Abdomen Pelvis Wo Contrast  Result Date: 04/16/2019 CLINICAL DATA:  Acute kidney injury, bilateral hydronephrosis and bladder outlet obstruction. EXAM: CT ABDOMEN AND PELVIS WITHOUT CONTRAST TECHNIQUE: Multidetector CT imaging of the  abdomen and pelvis was performed following the standard protocol without IV contrast. COMPARISON:  CT of the abdomen and pelvis without contrast on 04/06/2019 and renal ultrasound on 04/05/2019. FINDINGS: Lower chest: No acute abnormality. Hepatobiliary: The liver is unremarkable. The gallbladder is nondistended and likely contains some tiny dependent calculi. No evidence of biliary ductal dilatation. Pancreas: Unremarkable. No pancreatic ductal dilatation or surrounding inflammatory changes. Spleen: Normal in size without focal abnormality. Adrenals/Urinary Tract: The kidneys demonstrate no hydronephrosis. The ureters are nondilated. No calculi identified. The bladder is largely decompressed by a Foley catheter. There is evidence persistent circumferential wall thickening likely on the basis of chronic outlet obstruction. Adrenal glands remain unremarkable bilaterally. Stomach/Bowel: Bowel shows no evidence of obstruction, ileus, inflammation or lesion. No free air identified. Vascular/Lymphatic: Calcified plaque throughout much of the abdominal aorta, visceral arteries and iliac arteries as well as the common femoral arteries. No evidence of aneurysmal disease. No enlarged lymph nodes identified in the abdomen or pelvis. Reproductive: The prostate gland appears mild to moderately enlarged. Other: No abdominal wall hernia or abnormality. No abdominopelvic ascites. Musculoskeletal: No acute or significant osseous findings. IMPRESSION: 1. No evidence of recurrent hydronephrosis. The bladder is decompressed by a Foley catheter and demonstrates persistent circumferential wall thickening, likely on the basis of chronic outlet obstruction. 2. Probable small dependent calculi in the gallbladder. Electronically Signed   By: Sherrine Maples  Fredia SorrowYamagata M.D.   On: 04/16/2019 12:29    Labs: BMET Recent Labs  Lab 04/13/19 0517  04/14/19 0725  04/14/19 1806 04/14/19 2207 04/15/19 0549 04/15/19 1344 04/15/19 2109 04/16/19 0341  04/16/19 0901 04/16/19 1200 04/17/19 0449  NA 144  --  143  --  140 140 141 140  --  143  --  142 143  K 6.0*   < > 5.7*   < > 6.3* 6.3* 6.0* 6.0* 5.9* 6.0* 6.0* 5.6* 5.5*  CL 106  --  103  --  103 103 103 100  --  104  --  105 104  CO2 26  --  26  --  24 24 23 25   --  27  --  23 26  GLUCOSE 95  --  94  --  85 168* 94 118*  --  104*  --  108* 95  BUN 86*  --  80*  --  78* 82* 82* 85*  --  83*  --  84* 80*  CREATININE 4.34*  --  3.94*  --  4.05* 4.03* 4.02* 4.33*  --  4.34*  --  4.24* 4.08*  CALCIUM 8.5*  --  9.3  --  9.3 9.0 9.5 9.6  --  9.2  --  9.4 8.8*  PHOS 5.4*  --  5.4*  --   --   --   --   --   --   --   --   --   --    < > = values in this interval not displayed.   CBC Recent Labs  Lab 04/11/19 0610 04/13/19 0517 04/15/19 0549 04/16/19 0341 04/17/19 0449  WBC 10.2 9.2 7.9 9.5 8.1  NEUTROABS 6.7  --  4.5  --   --   HGB 9.6* 9.4* 10.3* 10.0* 9.1*  HCT 32.5* 32.3* 33.7* 33.2* 30.8*  MCV 93.9 96.4 94.9 95.4 95.1  PLT 169 182 190 220 219    Medications:    . aspirin  300 mg Rectal Daily   Or  . aspirin  325 mg Oral Daily  . atorvastatin  80 mg Oral q1800  . clopidogrel  75 mg Oral Daily  . dextrose  1 ampule Intravenous Once  . heparin  5,000 Units Subcutaneous Q8H  . insulin aspart  5 Units Intravenous Once  . mouth rinse  15 mL Mouth Rinse BID  . metoprolol tartrate  25 mg Oral BID  . patiromer  8.4 g Oral Daily  . sodium chloride flush  3 mL Intravenous Q12H  . sodium chloride flush  3 mL Intravenous Q12H   Arita MissRyan B Despina Boan 04/17/2019

## 2019-04-17 NOTE — Evaluation (Signed)
Speech Language Pathology Evaluation Patient Details Name: Gregory [name] MRN: [id number] DOB: 1951-06-09 Today's Date: 04/17/2019 Time: [id number] SLP Time Calculation (min) (ACUTE ONLY): 30 min  Problem List:  Patient Active Problem List   Diagnosis Date Noted  . Hyperkalemia 04/14/2019  . 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 Cerebral infarction due to embolism of left middle cerebral artery (HCC)   . Stroke (cerebrum) (HCC) 04/05/2019  . Demand ischemia (HCC) 04/05/2019  . 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 Pre-diabetes 09/05/2018  . Acute renal failure superimposed on stage 3 chronic kidney disease (HCC) 09/05/2018  . Bilateral carotid artery stenosis 09/05/2018  . Essential hypertension 09/05/2018  . TIA (transient ischemic attack) 09/04/2018   Past Medical History:  Past Medical History:  Diagnosis Date  . AKI (acute kidney injury) (HCC)   . CKD (chronic kidney disease) stage 2, GFR 60-89 ml/min   . CVA (cerebral vascular accident) (HCC)   . Hyperlipidemia   . Hypertension   . PVD (peripheral vascular disease) (HCC)    Past Surgical History:  Past Surgical History:  Procedure Laterality Date  . AORTIC ARCH ANGIOGRAPHY N/A 09/09/2018   Procedure: AORTIC ARCH ANGIOGRAPHY;  Surgeon: Sherren KernsFields, Charles E, MD;  Location: [facility] INVASIVE CV LAB;  Service: Cardiovascular;  Laterality: N/A;  . CAROTID ANGIOGRAPHY Bilateral 09/09/2018   Procedure: CAROTID ANGIOGRAPHY;  Surgeon: Sherren KernsFields, Charles E, MD;  Location: [facility] INVASIVE CV LAB;  Service: Cardiovascular;  Laterality: Bilateral;  . LOOP RECORDER INSERTION N/A 04/07/2019   Procedure: LOOP RECORDER INSERTION;  Surgeon: Marinus Mawaylor, Gregg W, MD;  [facility];  Service: Cardiovascular;  Laterality: N/A;   HPI:  Pt is a 68 y.o. male with medical history significant of chronic kidney disease stage III, hypertension, hyperlipidemia, who presented to the hospital  with chief complaint of sudden onset right-sided weakness and inability to talk.  MRI of the brain revealed 3.5 cm region of acute infarction affecting the left insula and posterior frontal region. Patient was admitted to to inpatient CIR on 04/10/19, [facility].   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 Patient is unable to produce any real words but is able to make some sounds. He is consistent with yes/no head shakes/nods for basic biographical, immediate environmental questions but does exhibit some inattention and is inconsistent with self-correcting. He used gestures to communicate basic needs, such as pointing to leg and grimacing to indicate pain. Patient exhibited oral motor apraxia, ie when instructed to pretend to blow out a match, he sucked air in, for waving goodbye, he stuck hand out but didnt move it, etc. Patient was able to point to pictures in field of 4 accurately when reminded to look in right visual field, but when field greater than 4, his attention and accuracy decreased. He followed basic one-step commands without difficulty but was not able to complete two-step directions. Patient's current level of impairment is significant in areas of expressive language, receptive language and he will benefit from acute SLP intervention.    SLP Assessment  SLP Recommendation/Assessment: Patient needs continued Speech Lanaguage Pathology Services SLP Visit Diagnosis: Aphasia (R47.01);Apraxia (R48.2)    Follow Up Recommendations  Inpatient Rehab    Frequency and Duration min 2x/week  1 week      SLP Evaluation Cognition  Overall Cognitive Status:  Impaired/Different from baseline Arousal/Alertness: Awake/alert Orientation Level: Oriented to person;Oriented to place;Disoriented to time;Disoriented to  situation;Other (comment) Problem Solving: Appears intact(appears intact for basic functional) Safety/Judgment: Appears intact Comments: difficult to assess due to aphasia       Comprehension  Auditory Comprehension Overall Auditory Comprehension: Impaired Yes/No Questions: Impaired Basic Biographical Questions: 76-100% accurate(8/10) Basic Immediate Environment Questions: 75-100% accurate(75%) Complex Questions: Not tested Commands: Impaired One Step Basic Commands: 75-100% accurate Two Step Basic Commands: 25-49% accurate Conversation: Simple Interfering Components: Attention;Processing speed Visual Recognition/Discrimination Common Objects: Unable to indentify Pictures: Unable to indentify Reading Comprehension Sentence Level: Not tested Paragraph Level: Not tested Functional Environmental (signs, name badge): Not tested Interfering Components: Attention;Right neglect/inattention;Processing time    Expression Expression Primary Mode of Expression: Nonverbal - gestures Verbal Expression Overall Verbal Expression: Impaired Initiation: Impaired Automatic Speech: (unable) Level of Generative/Spontaneous Verbalization: (makes some vocalizations, attempted to verbalize, produced no real words) Common Objects: Unable to indentify Pictures: Unable to indentify Convergent: Not tested Divergent: Not tested Pragmatics: No impairment Non-Verbal Means of Communication: Gestures Written Expression Dominant Hand: Right Written Expression: Not tested   Oral / Motor  Oral Motor/Sensory Function Overall Oral Motor/Sensory Function: Moderate impairment Facial ROM: Reduced right Facial Symmetry: Abnormal symmetry right Facial Strength: Reduced right Lingual ROM: Reduced right Lingual Symmetry: Abnormal symmetry right Lingual Strength: Reduced Mandible: Within Functional Limits Motor Speech Overall Motor Speech: Impaired Respiration: Within functional limits Phonation: Low vocal  intensity Resonance: Within functional limits Motor Planning: Impaired Level of Impairment: (motor apraxia with gestures and oral motor-movements)   GO                    Pablo Lawrence 04/17/2019, 5:08 PM   Angela Nevin, MA, CCC-SLP Speech Therapy [facility] Acute Rehab Pager: [phone]

## 2019-04-17 NOTE — TOC Initial Note (Signed)
**Note Gregory-Identified via Obfuscation** Transition of Care [facility]) - Initial/Assessment Note    Patient Details  Name: Gregory [name] MRN: [id number] Date of Birth: Jun 20, 1951  [facility] CM/SW Contact:    Gregory [name] Phone Number: [phone] 04/17/2019, 9:41 AM  Clinical Narrative:                 04/17/2019 - CM following for progression of care; pt admitted from CIR; B Gregory [name]  04/06/2019 - Clinical Narrative:  Patient from home with ex-wife, independent prior to admission. Per ex-wife, patient has been worsening over the past month, requiring assistance from her to move around the house over the past two weeks. Patient will have 24/7 assist at discharge, as there is also a daughter and a grandson who are CNAs that are available to provide assistance in the home.  Gregory [name] SW  Expected Discharge Plan: IP Rehab Facility Barriers to Discharge: No Barriers Identified   Patient Goals and CMS Choice        Expected Discharge Plan and Services Expected Discharge Plan: IP Rehab Facility In-house Referral: NA Discharge Planning Services: NA   Living arrangements for the past 2 months: Single Family Home                 DME Arranged: N/A DME Agency: NA       HH Arranged: NA HH Agency: NA        Prior Living Arrangements/Services Living arrangements for the past 2 months: Single Family Home Lives with:: Spouse Patient language and need for interpreter reviewed:: Yes        Need for Family Participation in Patient Care: Yes (Comment) Care giver support system in place?: Yes (comment)   Criminal Activity/Legal Involvement Pertinent to Current Situation/Hospitalization: No - Comment as needed  Activities of Daily Living      Permission Sought/Granted                  Emotional Assessment              Admission diagnosis:  hyperkalemia Patient Active Problem List   Diagnosis Date Noted  . Hyperkalemia 04/14/2019  . Bladder outlet obstruction  04/14/2019  . 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 Cerebral infarction due to embolism of left middle cerebral artery (HCC)   . Stroke (cerebrum) (HCC) 04/05/2019  . Demand ischemia (HCC) 04/05/2019  . 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 Pre-diabetes 09/05/2018  . Acute renal failure superimposed on stage 3 chronic kidney disease (HCC) 09/05/2018  . Bilateral carotid artery stenosis 09/05/2018  . Essential hypertension 09/05/2018  . TIA (transient ischemic attack) 09/04/2018   PCP:  System, Pcp Not In Pharmacy:  No Pharmacies Listed    Social Determinants of Health (SDOH) Interventions    Readmission Risk Interventions No flowsheet data found.

## 2019-04-18 ENCOUNTER — Other Ambulatory Visit: Payer: Self-pay

## 2019-04-18 ENCOUNTER — Encounter (HOSPITAL_COMMUNITY): Payer: Self-pay | Admitting: *Deleted

## 2019-04-18 ENCOUNTER — Inpatient Hospital Stay (HOSPITAL_COMMUNITY)
Admission: RE | Admit: 2019-04-18 | Discharge: 2019-05-17 | DRG: 056 | Disposition: A | Discharge status: Home Health | Payer: Medicare HMO | Source: Intra-hospital | Attending: Physical Medicine & Rehabilitation | Admitting: Physical Medicine & Rehabilitation

## 2019-04-18 DIAGNOSIS — I69390 Apraxia following cerebral infarction: Secondary | ICD-10-CM

## 2019-04-18 DIAGNOSIS — I129 Hypertensive chronic kidney disease with stage 1 through stage 4 chronic kidney disease, or unspecified chronic kidney disease: Secondary | ICD-10-CM | POA: Diagnosis present

## 2019-04-18 DIAGNOSIS — R0989 Other specified symptoms and signs involving the circulatory and respiratory systems: Secondary | ICD-10-CM

## 2019-04-18 DIAGNOSIS — G4733 Obstructive sleep apnea (adult) (pediatric): Secondary | ICD-10-CM | POA: Diagnosis present

## 2019-04-18 DIAGNOSIS — N183 Chronic kidney disease, stage 3 unspecified: Secondary | ICD-10-CM

## 2019-04-18 DIAGNOSIS — N400 Enlarged prostate without lower urinary tract symptoms: Secondary | ICD-10-CM | POA: Diagnosis present

## 2019-04-18 DIAGNOSIS — I63512 Cerebral infarction due to unspecified occlusion or stenosis of left middle cerebral artery: Secondary | ICD-10-CM | POA: Diagnosis not present

## 2019-04-18 DIAGNOSIS — I69391 Dysphagia following cerebral infarction: Secondary | ICD-10-CM | POA: Diagnosis not present

## 2019-04-18 DIAGNOSIS — R195 Other fecal abnormalities: Secondary | ICD-10-CM

## 2019-04-18 DIAGNOSIS — D62 Acute posthemorrhagic anemia: Secondary | ICD-10-CM | POA: Diagnosis present

## 2019-04-18 DIAGNOSIS — R1310 Dysphagia, unspecified: Secondary | ICD-10-CM | POA: Diagnosis present

## 2019-04-18 DIAGNOSIS — G8111 Spastic hemiplegia affecting right dominant side: Secondary | ICD-10-CM | POA: Diagnosis not present

## 2019-04-18 DIAGNOSIS — N179 Acute kidney failure, unspecified: Secondary | ICD-10-CM | POA: Diagnosis present

## 2019-04-18 DIAGNOSIS — I214 Non-ST elevation (NSTEMI) myocardial infarction: Secondary | ICD-10-CM | POA: Diagnosis present

## 2019-04-18 DIAGNOSIS — Z7982 Long term (current) use of aspirin: Secondary | ICD-10-CM | POA: Diagnosis not present

## 2019-04-18 DIAGNOSIS — Z7902 Long term (current) use of antithrombotics/antiplatelets: Secondary | ICD-10-CM

## 2019-04-18 DIAGNOSIS — R159 Full incontinence of feces: Secondary | ICD-10-CM | POA: Diagnosis not present

## 2019-04-18 DIAGNOSIS — N1330 Unspecified hydronephrosis: Secondary | ICD-10-CM | POA: Diagnosis present

## 2019-04-18 DIAGNOSIS — N320 Bladder-neck obstruction: Secondary | ICD-10-CM | POA: Diagnosis present

## 2019-04-18 DIAGNOSIS — G8110 Spastic hemiplegia affecting unspecified side: Secondary | ICD-10-CM | POA: Diagnosis not present

## 2019-04-18 DIAGNOSIS — M25571 Pain in right ankle and joints of right foot: Secondary | ICD-10-CM

## 2019-04-18 DIAGNOSIS — Z79899 Other long term (current) drug therapy: Secondary | ICD-10-CM

## 2019-04-18 DIAGNOSIS — I69351 Hemiplegia and hemiparesis following cerebral infarction affecting right dominant side: Secondary | ICD-10-CM | POA: Diagnosis present

## 2019-04-18 DIAGNOSIS — R197 Diarrhea, unspecified: Secondary | ICD-10-CM | POA: Diagnosis not present

## 2019-04-18 DIAGNOSIS — N189 Chronic kidney disease, unspecified: Secondary | ICD-10-CM

## 2019-04-18 DIAGNOSIS — G8191 Hemiplegia, unspecified affecting right dominant side: Secondary | ICD-10-CM | POA: Diagnosis not present

## 2019-04-18 DIAGNOSIS — M79604 Pain in right leg: Secondary | ICD-10-CM | POA: Diagnosis not present

## 2019-04-18 DIAGNOSIS — R41844 Frontal lobe and executive function deficit: Secondary | ICD-10-CM | POA: Diagnosis present

## 2019-04-18 DIAGNOSIS — E669 Obesity, unspecified: Secondary | ICD-10-CM | POA: Diagnosis present

## 2019-04-18 DIAGNOSIS — I749 Embolism and thrombosis of unspecified artery: Secondary | ICD-10-CM

## 2019-04-18 DIAGNOSIS — E875 Hyperkalemia: Secondary | ICD-10-CM | POA: Diagnosis present

## 2019-04-18 DIAGNOSIS — I63412 Cerebral infarction due to embolism of left middle cerebral artery: Secondary | ICD-10-CM | POA: Diagnosis not present

## 2019-04-18 DIAGNOSIS — I69398 Other sequelae of cerebral infarction: Secondary | ICD-10-CM | POA: Diagnosis not present

## 2019-04-18 DIAGNOSIS — E785 Hyperlipidemia, unspecified: Secondary | ICD-10-CM | POA: Diagnosis present

## 2019-04-18 DIAGNOSIS — I69320 Aphasia following cerebral infarction: Secondary | ICD-10-CM

## 2019-04-18 DIAGNOSIS — Z6831 Body mass index (BMI) 31.0-31.9, adult: Secondary | ICD-10-CM | POA: Diagnosis not present

## 2019-04-18 DIAGNOSIS — K5900 Constipation, unspecified: Secondary | ICD-10-CM | POA: Diagnosis present

## 2019-04-18 DIAGNOSIS — R4701 Aphasia: Secondary | ICD-10-CM | POA: Diagnosis not present

## 2019-04-18 DIAGNOSIS — R7989 Other specified abnormal findings of blood chemistry: Secondary | ICD-10-CM | POA: Diagnosis present

## 2019-04-18 DIAGNOSIS — I10 Essential (primary) hypertension: Secondary | ICD-10-CM | POA: Diagnosis not present

## 2019-04-18 LAB — COMPREHENSIVE METABOLIC PANEL
ALT: 94 U/L — ABNORMAL HIGH (ref 0–44)
AST: 74 U/L — ABNORMAL HIGH (ref 15–41)
Albumin: 2.6 g/dL — ABNORMAL LOW (ref 3.5–5.0)
Alkaline Phosphatase: 130 U/L — ABNORMAL HIGH (ref 38–126)
Anion gap: 12 (ref 5–15)
BUN: 75 mg/dL — ABNORMAL HIGH (ref 8–23)
CO2: 24 mmol/L (ref 22–32)
Calcium: 9 mg/dL (ref 8.9–10.3)
Chloride: 104 mmol/L (ref 98–111)
Creatinine, Ser: 4.01 mg/dL — ABNORMAL HIGH (ref 0.61–1.24)
GFR calc Af Amer: 17 mL/min — ABNORMAL LOW (ref >60)
GFR calc non Af Amer: 14 mL/min — ABNORMAL LOW (ref >60)
Glucose, Bld: 96 mg/dL (ref 70–99)
Potassium: 5.8 mmol/L — ABNORMAL HIGH (ref 3.5–5.1)
Sodium: 140 mmol/L (ref 135–145)
Total Bilirubin: 0.5 mg/dL (ref 0.3–1.2)
Total Protein: 7 g/dL (ref 6.5–8.1)

## 2019-04-18 LAB — CBC
HCT: 30.5 % — ABNORMAL LOW (ref 39.0–52.0)
Hemoglobin: 9.1 g/dL — ABNORMAL LOW (ref 13.0–17.0)
MCH: 28.2 pg (ref 26.0–34.0)
MCHC: 29.8 g/dL — ABNORMAL LOW (ref 30.0–36.0)
MCV: 94.4 fL (ref 80.0–100.0)
Platelets: 230 10*3/uL (ref 150–400)
RBC: 3.23 MIL/uL — ABNORMAL LOW (ref 4.22–5.81)
RDW: 13.3 % (ref 11.5–15.5)
WBC: 9.1 10*3/uL (ref 4.0–10.5)
nRBC: 0 % (ref 0.0–0.2)

## 2019-04-18 MED ORDER — METOPROLOL TARTRATE 25 MG PO TABS
25.0000 mg | ORAL_TABLET | Freq: Two times a day (BID) | ORAL | Status: DC
  Administered 2019-04-18 – 2019-05-17 (×55): 25 mg via ORAL
  Filled 2019-04-18 (×59): qty 1

## 2019-04-18 MED ORDER — HEPARIN SODIUM (PORCINE) 5000 UNIT/ML IJ SOLN
5000.0000 [IU] | Freq: Three times a day (TID) | INTRAMUSCULAR | Status: DC
  Administered 2019-04-18 – 2019-05-11 (×68): 5000 [IU] via SUBCUTANEOUS
  Filled 2019-04-18 (×69): qty 1

## 2019-04-18 MED ORDER — SORBITOL 70 % SOLN
30.0000 mL | Freq: Every day | Status: DC | PRN
  Administered 2019-05-03: 07:00:00 30 mL via ORAL
  Filled 2019-04-18: qty 30

## 2019-04-18 MED ORDER — ONDANSETRON HCL 4 MG PO TABS
4.0000 mg | ORAL_TABLET | Freq: Four times a day (QID) | ORAL | Status: DC | PRN
  Administered 2019-04-22: 02:00:00 4 mg via ORAL
  Filled 2019-04-18 (×2): qty 1

## 2019-04-18 MED ORDER — PATIROMER SORBITEX CALCIUM 8.4 G PO PACK
16.8000 g | PACK | Freq: Every day | ORAL | Status: DC
  Filled 2019-04-18: qty 2

## 2019-04-18 MED ORDER — SODIUM CHLORIDE 0.45 % IV SOLN
INTRAVENOUS | Status: DC
  Administered 2019-04-18 – 2019-04-19 (×2): via INTRAVENOUS

## 2019-04-18 MED ORDER — ACETAMINOPHEN 650 MG RE SUPP: 650.0000 mg | Freq: Four times a day (QID) | RECTAL | Status: DC | PRN

## 2019-04-18 MED ORDER — CLOPIDOGREL BISULFATE 75 MG PO TABS
75.0000 mg | ORAL_TABLET | Freq: Every day | ORAL | Status: DC
  Administered 2019-04-19 – 2019-05-17 (×29): 75 mg via ORAL
  Filled 2019-04-18 (×29): qty 1

## 2019-04-18 MED ORDER — ATORVASTATIN CALCIUM 80 MG PO TABS
80.0000 mg | ORAL_TABLET | Freq: Every day | ORAL | Status: DC
  Administered 2019-04-18 – 2019-05-16 (×29): 80 mg via ORAL
  Filled 2019-04-18 (×28): qty 1

## 2019-04-18 MED ORDER — ASPIRIN 300 MG RE SUPP
300.0000 mg | Freq: Every day | RECTAL | Status: DC
  Filled 2019-04-18 (×3): qty 1

## 2019-04-18 MED ORDER — ASPIRIN 325 MG PO TABS
325.0000 mg | ORAL_TABLET | Freq: Every day | ORAL | Status: DC
  Administered 2019-04-19 – 2019-05-17 (×28): 325 mg via ORAL
  Filled 2019-04-18 (×29): qty 1

## 2019-04-18 MED ORDER — PATIROMER SORBITEX CALCIUM 8.4 G PO PACK
16.8000 g | PACK | Freq: Every day | ORAL | Status: DC
  Administered 2019-04-18: 16.8 g via ORAL
  Filled 2019-04-18: qty 2

## 2019-04-18 MED ORDER — ONDANSETRON HCL 4 MG/2ML IJ SOLN: 4.0000 mg | Freq: Four times a day (QID) | INTRAMUSCULAR | Status: DC | PRN

## 2019-04-18 MED ORDER — METOPROLOL TARTRATE 25 MG PO TABS: 25.0000 mg | ORAL_TABLET | Freq: Two times a day (BID) | ORAL | 0 refills | Status: DC

## 2019-04-18 MED ORDER — HEPARIN SODIUM (PORCINE) 5000 UNIT/ML IJ SOLN: 5000.0000 [IU] | Freq: Three times a day (TID) | INTRAMUSCULAR | Status: DC

## 2019-04-18 MED ORDER — PATIROMER SORBITEX CALCIUM 8.4 G PO PACK: 16.8000 g | PACK | Freq: Every day | ORAL | 0 refills | Status: DC

## 2019-04-18 MED ORDER — ACETAMINOPHEN 325 MG PO TABS
650.0000 mg | ORAL_TABLET | Freq: Four times a day (QID) | ORAL | Status: DC | PRN
  Administered 2019-04-22 – 2019-05-16 (×10): 650 mg via ORAL
  Filled 2019-04-18 (×9): qty 2

## 2019-04-18 NOTE — Progress Notes (Signed)
  Speech Language Pathology Treatment: Cognitive-Linquistic(Aphasia/apraxia)  Patient Details Name: Gregory [name] MRN: [id number] DOB: 04/05/1951 Today's Date: 04/18/2019 Time: 7628-3151 SLP Time Calculation (min) (ACUTE ONLY): 23 min  Assessment / Plan / Recommendation Clinical Impression  Pt was seen for treatment and was cooperative throughout the session. He continues to demonstrate periods of frustration but to a lesser extent than was noted prior to his admission to rehab. Pt did verbalize "yes" and "no" during the session but precision and accuracy was variable. He continues to demonstrate difficulty following 1-step commands but was able to respond to yes/no questions related to simple paragraphs with 70% accuracy increasing to 80% with re-phrasing. The impact of apraxia on his ability to follow commands is therefore still considered as it was while he was on rehab. He imitated CV words and syllables with /t/ with 50% accuracy but was able to consistently imitate /t/. Pt currently has discharge orders to return to inpatient rehab and he will benefit from continues SLP services following discharge.     HPI HPI: Pt is a 68 y.o. male with medical history significant of chronic kidney disease stage III, hypertension, hyperlipidemia, who presented to the hospital with chief complaint of sudden onset right-sided weakness and inability to talk.  MRI of the brain revealed 3.5 cm region of acute infarction affecting the left insula and posterior frontal region. Patient was admitted to to inpatient CIR on 04/10/19, now readmitted from [facility] 04/14/19 with hyperkalemia.      SLP Plan  Continue with current plan of care       Recommendations                   Oral Care Recommendations: Oral care BID Follow up Recommendations: Inpatient Rehab SLP Visit Diagnosis: Aphasia (R47.01);Apraxia (R48.2) Plan: Continue with current plan of care       Marcial Pless I. Vear Clock, MS, CCC-SLP Acute  Rehabilitation Services Office number [phone] Pager [phone]               Scheryl Marten 04/18/2019, 2:22 PM

## 2019-04-18 NOTE — Progress Notes (Signed)
Gregory [name], OT  Rehab Admission Coordinator  Physical Medicine and Rehabilitation  PMR Pre-admission  Signed  Date of Service:  04/18/2019 9:06 AM       Related encounter: Admission (Discharged) from 04/14/2019 in [facility]         PMR Admission Coordinator Pre-Admission Assessment  Patient: Gregory [name] is an 68 y.o., male MRN: [id number] DOB: 1951/04/30 Height:   Weight: 83.7 kg                                                                                                                                                  Insurance Information HMO: Yes    PPO:      PCP:      IPA:      80/20:      OTHER:  PRIMARY: [employer]      Policy#: [id number]      Subscriber: Patient CM Name: no pre-auth required for IP-Rehab admission due to COVID-19 pandemic pre-auth waiver period. Case opened using [employer] online portal with case auto-approved for 3 days.        Phone#: auto-confirmed via portal  Fax#: [phone] Pre-Cert#: [id number]      Employer:  Josem Kaufmann provided via portal as auto approved on 4/28 for admit 4/28. Pt is approved for 3 days starting 4/28, with update due on day 3 (4/30). Per Dot, CM is [employer] (p): [phone], ext [phone]; (f): [phone] Benefits:  Phone #: NA     Name: online at [email or web address] Eff. Date: 12/21/18 still active     Deduct: NA      Out of Pocket Max: $3,400 ($0 met)      Life Max: NA CIR: $295/day for days 1-6, $0/day for days 7+      SNF: $0/day for days 1-20, $178/day for days 21-100, limited to 100 days/cal yr Outpatient: limited to medical necessity     Co-Pay: $10-40/visit Home Health: 100%      Co-Pay: 0% DME: 80%     Co-Pay: 20% Providers:  SECONDARY:       Policy#:       Subscriber:  CM Name:       Phone#:      Fax#:  Pre-Cert#:       Employer:  Benefits:  Phone #:      Name:  Eff. Date:      Deduct:       Out of Pocket Max:       Life Max:  CIR:       SNF:  Outpatient:      Co-Pay:  Home  Health:       Co-Pay:  DME:     Co-Pay:   Medicaid Application Date:       Case Manager: Disability Application Date:  Case Worker:   The "Data Collection Information Summary" for patients in Inpatient Rehabilitation Facilities with attached "Privacy Act [place] Records" was provided and verbally reviewed with: Family and patient  Emergency Contact Information         Contact Information    Name Relation Home Work [place] B Other [phone]     [name], [name]   [phone]     Current Medical History  Patient Admitting Diagnosis: 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 Presented 04/05/2019 with acute onset of right-sided weakness and slurred speech. Cranial CT scan showed possible early gray-white differentiation loss in the insula on the left. Chronic small vessel ischemic changes. 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 No evidence of gross hemorrhage. MRA negative. Patient did not receive TPA. Noted findings of hyperkalemia 6.8 as well as elevated creatinine 10.4 from baseline 1.70, troponin 3.29.EKG with ST changes. Cardiology services consulted for suspect non-STEMI medical management recommended. Patient did undergo placement of loop recorder 04/07/2019 per Dr. Lovena Le. Renal ultrasound showed bilateral hydronephrosis with nephrology services consulted as well as urology. A Foley catheter tube was placed and would remain in place until follow-up outpatient urology. Renal function continued to improve and monitored with creatinine 4.08. Maintained on aspirin for  CVA prophylaxis. Subcutaneous heparin for DVT prophylaxis. Dysphagia #1 honey thick liquid diet. Patient was admitted to inpatient rehab services 04/10/2019 with steady progressive gains. Noted on 04/14/2019 with noted ongoing bouts of hyperkalemia and did not respond as expected to Lasix.. Nephrology recommended Kayexalate enema and insulin with dextrose. Patient became more confused and it was felt would benefit from discharge to acute care services for ongoing monitoring. EKG follow-up without peaked T waves hemodynamically stable but remained on telemetry. Hyperkalemia improving 5.8 and placed on VELTASSA16.8g dailyand patient and family were to be instructed also on a low potassium diet restriction .Follow-up renal services in regards to hyperkalemia acute kidney injury no plan for dialysis at this time .Therapies have been resumed with recommendations for return to CIR. Pt is to be admitted to CIR on 04/18/19.    Glasgow Coma Scale Score: 11  Past Medical History      Past Medical History:  Diagnosis Date  . AKI (acute kidney injury) ([place])   . CKD (chronic kidney disease) stage 2, GFR 60-89 ml/min   . CVA (cerebral vascular accident) ([place])   . Hyperlipidemia   . Hypertension   . PVD (peripheral vascular disease) ([place])     Family History  family history is not on file.  Prior Rehab/Hospitalizations:  Has the patient had prior rehab or hospitalizations prior to admission? Yes  Has the patient had major surgery during 100 days prior to admission? Yes  Current Medications   Current Facility-Administered Medications:  .  0.45 % sodium chloride infusion, , Intravenous, Continuous, Sanford, Ryan B, MD, Last Rate: 50 mL/hr at 04/18/19 1127 .  0.9 %  sodium chloride infusion, 250 mL, Intravenous, PRN, Opyd, Ilene Qua, MD .  acetaminophen (TYLENOL) tablet 650 mg, 650 mg, Oral, Q6H PRN **OR** acetaminophen (TYLENOL) suppository 650 mg, 650 mg, Rectal, Q6H PRN, Opyd,  Timothy S, MD .  aspirin suppository 300 mg, 300  mg, Rectal, Daily **OR** aspirin tablet 325 mg, 325 mg, Oral, Daily, Opyd, Ilene Qua, MD, 325 mg at 04/18/19 1014 .  atorvastatin (LIPITOR) tablet 80 mg, 80 mg, Oral, q1800, Opyd, Ilene Qua, MD, 80 mg at 04/17/19 1740 .  clopidogrel (PLAVIX) tablet 75 mg, 75 mg, Oral, Daily, Opyd, Ilene Qua, MD, 75 mg at 04/18/19 1014 .  dextrose 50 % solution 50 mL, 1 ampule, Intravenous, Once, Harrie Jeans C, MD .  heparin injection 5,000 Units, 5,000 Units, Subcutaneous, Q8H, Opyd, Ilene Qua, MD, 5,000 Units at 04/18/19 0559 .  insulin aspart (novoLOG) injection 5 Units, 5 Units, Intravenous, Once, Harrie Jeans C, MD .  MEDLINE mouth rinse, 15 mL, Mouth Rinse, BID, Opyd, Ilene Qua, MD, 15 mL at 04/17/19 2256 .  metoprolol tartrate (LOPRESSOR) tablet 25 mg, 25 mg, Oral, BID, Opyd, Ilene Qua, MD, 25 mg at 04/18/19 1014 .  ondansetron (ZOFRAN) tablet 4 mg, 4 mg, Oral, Q6H PRN **OR** ondansetron (ZOFRAN) injection 4 mg, 4 mg, Intravenous, Q6H PRN, Opyd, Ilene Qua, MD .  patiromer (VELTASSA) packet 16.8 g, 16.8 g, Oral, Daily, Sanford, Ryan B, MD .  sodium chloride flush (NS) 0.9 % injection 3 mL, 3 mL, Intravenous, Q12H, Opyd, Ilene Qua, MD, 3 mL at 04/14/19 2240 .  sodium chloride flush (NS) 0.9 % injection 3 mL, 3 mL, Intravenous, Q12H, Opyd, Ilene Qua, MD, 3 mL at 04/17/19 2253 .  sodium chloride flush (NS) 0.9 % injection 3 mL, 3 mL, Intravenous, PRN, Opyd, Ilene Qua, MD  Patients Current Diet:     Diet Order                  DIET - DYS 1 Room service appropriate? No; Fluid consistency: Honey Thick  Diet effective now               Precautions / Restrictions Precautions Precautions: Fall Precaution Comments: Expressive aphasia, R hemiparesis Restrictions Weight Bearing Restrictions: No   Has the patient had 2 or more falls or a fall with injury in the past year?Yes  Prior Activity Level Community (5-7x/wk): retired Building control surveyor; English as a second language teacher;  drove PTA  Prior Functional Level Prior Function Level of Independence: Needs assistance Comments: difficult to assess PLOF secondary to communication impairments  Self Care: Did the patient need help bathing, dressing, using the toilet or eating?  Independent  Indoor Mobility: Did the patient need assistance with walking from room to room (with or without device)? Independent  Stairs: Did the patient need assistance with internal or external stairs (with or without device)? Independent  Functional Cognition: Did the patient need help planning regular tasks such as shopping or remembering to take medications? Independent  Home Assistive Devices / Equipment  Prior Device Use: Indicate devices/aids used by the patient prior to current illness, exacerbation or injury? Walker  Current Functional Level Cognition  Arousal/Alertness: Awake/alert Overall Cognitive Status: Impaired/Different from baseline Difficult to assess due to: Impaired communication Orientation Level: Oriented to person, Oriented to place, Disoriented to time, Disoriented to situation, Other (comment)(expressive aphasia but nods appropriately) Following Commands: Follows one step commands with increased time, Follows multi-step commands inconsistently General Comments: Pt able to nod yes/no to answer simple questions. Pt requiring increased time for ADLs. Pt followims simple cues  Problem Solving: Appears intact(appears intact for basic functional) Safety/Judgment: Appears intact Comments: difficult to assess due to aphasia    Extremity Assessment (includes Sensation/Coordination)  Upper Extremity Assessment: RUE deficits/detail RUE Deficits / Details: Tendency for flexion synergy pattern. Pt  able to lift to stedy with Min A to guide positioning.  RUE Coordination: decreased fine motor, decreased gross motor  Lower Extremity Assessment: Defer to PT evaluation RLE Deficits / Details: weakness s/p CVA RLE  Coordination: decreased fine motor, decreased gross motor    ADLs  Overall ADL's : Needs assistance/impaired Eating/Feeding: Minimal assistance, Sitting Eating/Feeding Details (indicate cue type and reason): Pt requiring assistance to cut food into bite size pieces. Requiring assistance for bilateral tasks. Pt attempting to bring eggs to mouth with fork and dropping ~25% of food. Cued pt to use hands and eat as a finger food. Pt eating apple sauce with spoon successfully.  Grooming: Wash/dry face, Sitting, Moderate assistance, Maximal assistance(sitting on stedy) Grooming Details (indicate cue type and reason): Pt sitting on stedy at sink and participating in grooming. Challenging sitting balance during ADL. Pt able to sit at sink with Min-Mod A. During ADL, pt requiring Mod A for upright posture with lateral lean right. As he fatigued he required Max A to maintain sitting posture. Pt using LUE throughout and facilitating weight bearing through RUE on sink.  Upper Body Bathing: Minimal assistance, Sitting Lower Body Bathing: Maximal assistance, Sitting/lateral leans, Sit to/from stand Upper Body Dressing : Minimal assistance, Sitting Lower Body Dressing: Maximal assistance Lower Body Dressing Details (indicate cue type and reason): don socks at bed level Toilet Transfer: Moderate assistance, +2 for physical assistance(sit<>stand with stedy and simulated to recliner) Toilet Transfer Details (indicate cue type and reason): Min A for initial sit<>stand from EOB. As pt fatigued, he required Mod A +2 for sit<>stand from stedy.  Toileting- Clothing Manipulation and Hygiene: Total assistance, Sit to/from stand Functional mobility during ADLs: +2 for physical assistance, +2 for safety/equipment, Minimal assistance, Moderate assistance(stedy for transfers) General ADL Comments: Using stedy for pt to perform grooming at sink. Pt sitting up in recliner at end of session and performing self feeding.      Mobility  Overal bed mobility: Needs Assistance Bed Mobility: Supine to Sit Rolling: Min guard, Min assist Sidelying to sit: Mod assist Supine to sit: Mod assist, +2 for safety/equipment Sit to supine: Mod assist, +2 for physical assistance General bed mobility comments: Mod A for bringing BLEs towards EOB. Pt pushing into upright posture with Min A and then Mod A to weight bear through RUE    Transfers  Overall transfer level: Needs assistance Equipment used: 2 person hand held assist Transfer via Lift Equipment: Stedy Transfers: Sit to/from Stand Sit to Stand: Mod assist, +2 physical assistance, Min assist General transfer comment: Min A +2 from EOB with use of stedy. After grooming at sink, pt fatigued and requiring Mod A +2 to stand from stedy and transfer to recliner.     Ambulation / Gait / Stairs / Office manager / Balance Dynamic Sitting Balance Sitting balance - Comments: assist required initially and then able to maintain sitting balance EOB with min guard Balance Overall balance assessment: Needs assistance Sitting-balance support: Feet supported, Single extremity supported Sitting balance-Leahy Scale: Fair Sitting balance - Comments: assist required initially and then able to maintain sitting balance EOB with min guard Postural control: Right lateral lean Standing balance support: Bilateral upper extremity supported Standing balance-Leahy Scale: Poor Standing balance comment: Reliant on UE support and physical A; fatigues quickly, especially when participateing in ADL    Special needs/care consideration BiPAP/CPAP: yes CPAP while in acute; at one point used CPAP at home at night, but according to  son, no longer has it CPM: no Continuous Drip IV: 0.45% sodium chloride infusion  Dialysis: no        Days: no Life Vest: no Oxygen: no  Special Bed: no Trach Size: no Wound Vac (area): no      Location: no Skin: ecchymosis to Bilateral arms,  abrasion to right elbow, left heel and knee                 Bowel mgmt:last BM: 04/15/19, incontinence Bladder mgmt: urinary catheter Diabetic mgmt: no Behavioral consideration : no Chemo/radiation : no     Previous Home Environment (from acute therapy documentation) Living Arrangements: Spouse/significant other Available Help at Discharge: Family Type of Home: House Home Layout: One level Home Access: Stairs to enter Technical brewer of Steps: 2 Bathroom Shower/Tub: Chiropodist: Standard Additional Comments: Home set-up information taken from CIR evaluation 4 days ago  Discharge Living Setting Plans for Discharge Living Setting: Patient's home, Lives with (comment)(lives with ex-wife) Type of Home at Discharge: House Discharge Home Layout: One level Discharge Home Access: Stairs to enter, Other (comment)(wife states believes [facility] could help with ramp eventually) Entrance Stairs-Rails: Can reach both Entrance Stairs-Number of Steps: 3-4 Discharge Bathroom Shower/Tub: Tub/shower unit Discharge Bathroom Toilet: Standard Discharge Bathroom Accessibility: Yes How Accessible: Accessible via walker Does the patient have any problems obtaining your medications?: No  Social/Family/Support Systems Patient Roles: Other (Comment)(ex-wife lives with him; has son who lives nearby) Sport and exercise psychologist Information: ex-wife: Deneise Lever [phone] Mantis (son): [phone] Anticipated Caregiver: Deneise Lever and Elenore Rota Anticipated [employer] Information: see above Ability/Limitations of Caregiver: Airline pilot is needed" Caregiver Availability: 24/7 Discharge Plan Discussed with Primary Caregiver: Yes Is Caregiver In Agreement with Plan?: Yes Does Caregiver/Family have Issues with Lodging/Transportation while Pt is in Rehab?: No   Goals/Additional Needs Patient/Family Goal for Rehab: PT/OT: Min/Mod A; SLP: Min A Expected length of stay: 20-24 days Cultural  Considerations: NA Dietary Needs: DYS 1, honey thick liquid (no room service) Equipment Needs: TBD Pt/Family Agrees to Admission and willing to participate: Yes Program Orientation Provided & Reviewed with Pt/Caregiver Including Roles  & Responsibilities: Yes(pt and wife (son informed prior admission))  Barriers to Discharge: Home environment access/layout  Barriers to Discharge Comments: steps to enter; tub shower   Decrease burden of Care through IP rehab admission: NA   Possible need for SNF placement upon discharge: Not anticipated, pt has good social support at DC through family Pt has good prognosis for further progress through CIR.    Patient Condition: This patient's medical and functional status has changed since the consult dated: 4/17 in which the Rehabilitation Physician determined and documented that the patient's condition is appropriate for intensive rehabilitative care in an inpatient rehabilitation facility. See "History of Present Illness" (above) for medical update. Functional changes since consult are: increased assistance noted in bed mobility from Mod A to Mod A x2, improvement in transfer status from an inability to transfer due to medical condition and elevated HR to Mod A x2 with HHA. Patient's medical and functional status update has been discussed with the Rehabilitation physician and patient remains appropriate for inpatient rehabilitation. Will admit to inpatient rehab today.  Preadmission Screen Completed By:  Gregory [name], OT, 04/18/2019 1:08 PM ______________________________________________________________________   Discussed status with Dr. Naaman Plummer on 04/18/19 at 1:08PM and received approval for admission today.  Admission Coordinator:  Gregory [name], time 1:30PM/Date4/28/20         Cosigned by: Meredith Staggers, MD at 04/18/2019 1:32 PM  Revision History    Date/Time User Provider Type Action  04/18/2019 1:32 PM Meredith Staggers, MD Physician Cosign   04/18/2019 1:30 PM Gregory [name], [facility] [facility] Admission Coordinator Sign

## 2019-04-18 NOTE — Progress Notes (Signed)
Pt admitted to 4W 24 with no issues. Pt in bed with bed alarm in place.

## 2019-04-18 NOTE — Progress Notes (Signed)
Physical Therapy Treatment Patient Details Name: Gregory HazyDonald Lee Hurrell MRN: [id number] DOB: Feb 18, 1951 Today's Date: 04/18/2019    History of Present Illness Pt is a 68 y.o. male initially admitted 04/05/19 with expressive aphasia and R-side weakness due to acute CVA (hospital course complicated by NSTEMI and AKI) with d/c to CIR on 04/10/19, [facility] hyperkalemia. PMH includes CAD, HTN, OSA on CPAP; left insular and posterior frontal infarct.    PT Comments    Continuing work on functional mobility and activity tolerance;  [name] is a hard worker and eager to participate; noting better initiation with bed mobility, as well as slightly improved sitting balance EOB; Noted plans for return to CIR for continuing rehab  Follow Up Recommendations  CIR;Supervision for mobility/OOB     Equipment Recommendations  Other (comment)(to be determined)    Recommendations for Other Services       Precautions / Restrictions Precautions Precautions: Fall Precaution Comments: Expressive aphasia, R hemiparesis    Mobility  Bed Mobility Overal bed mobility: Needs Assistance Bed Mobility: Supine to Sit Rolling: Min guard;Min assist Sidelying to sit: Mod assist       General bed mobility comments: Good initiation today; got up on his L side; cues to attend to RUE as he rolled; Mod assist to elevate trunk to sit, but good push up; uncoordinated movement  Transfers Overall transfer level: Needs assistance Equipment used: 2 person hand held assist Transfers: Sit to/from Stand Sit to Stand: Mod assist;+2 physical assistance;Min assist         General transfer comment: Noted mroe fatigued today and requiring Mod A +2 to stand from stedy and transfer to recliner.   Ambulation/Gait                 Stairs             Wheelchair Mobility    Modified Rankin (Stroke Patients Only) Modified Rankin (Stroke Patients Only) Pre-Morbid Rankin Score: No  symptoms Modified Rankin: Severe disability     Balance Overall balance assessment: Needs assistance Sitting-balance support: Feet supported;Single extremity supported Sitting balance-Leahy Scale: Fair Sitting balance - Comments: assist required initially and then able to maintain sitting balance EOB with min guard   Standing balance support: Bilateral upper extremity supported Standing balance-Leahy Scale: Poor Standing balance comment: Reliant on UE support and physical A; fatigues quickly, especially when participateing in ADL                            Cognition Arousal/Alertness: Awake/alert Behavior During Therapy: WFL for tasks assessed/performed Overall Cognitive Status: Impaired/Different from baseline Area of Impairment: Following commands;Problem solving;Awareness                       Following Commands: Follows one step commands with increased time;Follows multi-step commands inconsistently   Awareness: Emergent Problem Solving: Requires verbal cues;Requires tactile cues General Comments: Pt able to nod yes/no to answer simple questions. Pt requiring increased time for ADLs. Pt followims simple cues       Exercises      General Comments General comments (skin integrity, edema, etc.): Session conducted on room air      Pertinent Vitals/Pain Pain Assessment: Faces Faces Pain Scale: Hurts a little bit Pain Location: patient indicating right knee, lower leg  Pain Descriptors / Indicators: Discomfort;Spasm Pain Intervention(s): Monitored during session;Other (comment)(padded the knee support on the stedy)  Home Living                      Prior Function            PT Goals (current goals can now be found in the care plan section) Acute Rehab PT Goals Patient Stated Goal: Agreeable for continued rehab PT Goal Formulation: With patient Time For Goal Achievement: 04/29/19 Potential to Achieve Goals: Good Progress towards PT  goals: Progressing toward goals    Frequency    Min 4X/week      PT Plan Current plan remains appropriate    Co-evaluation              AM-PAC PT "6 Clicks" Mobility   Outcome Measure  Help needed turning from your back to your side while in a flat bed without using bedrails?: A Lot Help needed moving from lying on your back to sitting on the side of a flat bed without using bedrails?: A Lot Help needed moving to and from a bed to a chair (including a wheelchair)?: A Lot Help needed standing up from a chair using your arms (e.g., wheelchair or bedside chair)?: A Lot Help needed to walk in hospital room?: A Lot Help needed climbing 3-5 steps with a railing? : Total 6 Click Score: 11    End of Session Equipment Utilized During Treatment: Gait belt Activity Tolerance: Patient tolerated treatment well Patient left: in chair;with call bell/phone within reach;with chair alarm set Nurse Communication: Mobility status;Need for lift equipment PT Visit Diagnosis: Other abnormalities of gait and mobility (R26.89);Hemiplegia and hemiparesis Hemiplegia - Right/Left: Right Hemiplegia - dominant/non-dominant: Non-dominant Hemiplegia - caused by: Cerebral infarction     Time: 1205-1233 PT Time Calculation (min) (ACUTE ONLY): 28 min  Charges:  $Therapeutic Activity: 23-37 mins                     Van Clines, PT  Acute Rehabilitation Services Pager [phone] Office [phone]    Levi Aland 04/18/2019, 3:36 PM

## 2019-04-18 NOTE — PMR Pre-admission (Signed)
PMR Admission Coordinator Pre-Admission Assessment  Patient: Gregory [name] is an 68 y.o., male MRN: [id number] DOB: 04/10/1951 Height:   Weight: 83.7 kg              Insurance Information HMO: Yes    PPO:      PCP:      IPA:      80/20:      OTHER:  PRIMARY: [employer]      Policy#: [id number]      Subscriber: Patient CM Name: no pre-auth required for IP-Rehab admission due to COVID-19 pandemic pre-auth waiver period. Case opened using [employer] online portal with case auto-approved for 3 days.        Phone#: auto-confirmed via portal  Fax#: [phone] Pre-Cert#: [id number]      Employer:  Josem Kaufmann provided via portal as auto approved on 4/28 for admit 4/28. Pt is approved for 3 days starting 4/28, with update due on day 3 (4/30). Per Dot, CM is [employer] (p): [phone], ext [phone]; (f): [phone] Benefits:  Phone #: NA     Name: online at [email or web address] Eff. Date: 12/21/18 still active     Deduct: NA      Out of Pocket Max: $3,400 ($0 met)      Life Max: NA CIR: $295/day for days 1-6, $0/day for days 7+      SNF: $0/day for days 1-20, $178/day for days 21-100, limited to 100 days/cal yr Outpatient: limited to medical necessity     Co-Pay: $10-40/visit Home Health: 100%      Co-Pay: 0% DME: 80%     Co-Pay: 20% Providers:  SECONDARY:       Policy#:       Subscriber:  CM Name:       Phone#:      Fax#:  Pre-Cert#:       Employer:  Benefits:  Phone #:      Name:  Eff. Date:      Deduct:       Out of Pocket Max:       Life Max:  CIR:       SNF:  Outpatient:      Co-Pay:  Home Health:       Co-Pay:  DME:     Co-Pay:   Medicaid Application Date:       Case Manager: Disability Application Date:       Case Worker:   The "Data Collection Information Summary" for patients in Inpatient Rehabilitation Facilities with attached "Privacy Act Brent Records" was provided and verbally reviewed with: Family and patient  Emergency Contact Information Contact Information     Name Relation Home Work [place] [address] Other [phone]     [name], [name]   [phone]     Current Medical History  Patient Admitting Diagnosis: 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 Presented 04/05/2019 with acute onset of right-sided weakness and slurred speech.  Cranial CT scan showed possible early gray-white differentiation loss in the insula on the left.  Chronic small vessel ischemic changes.  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  No evidence of gross hemorrhage.  MRA negative.  Patient did not  receive TPA.  Noted findings of hyperkalemia 6.8 as well as elevated creatinine 10.4 from baseline 1.70, troponin 3.29.  EKG with ST changes.  Cardiology services consulted for suspect non-STEMI medical management recommended.  Patient did undergo placement of loop recorder 04/07/2019 per Dr. Taylor.  Renal ultrasound showed bilateral hydronephrosis with nephrology services consulted as well as urology.  A Foley catheter tube was placed and would remain in place until follow-up outpatient urology.  Renal function continued to improve and monitored with creatinine 4.08.  Maintained on aspirin for CVA prophylaxis.  Subcutaneous heparin for DVT prophylaxis.  Dysphagia #1 honey thick liquid diet.  Patient was admitted to inpatient rehab services 04/10/2019 with steady progressive gains.  Noted on 04/14/2019 with noted ongoing bouts of hyperkalemia and did not respond as expected to Lasix..  Nephrology recommended Kayexalate enema and insulin with dextrose.  Patient became more confused and it was felt would benefit from discharge  to acute care services for ongoing monitoring.  EKG follow-up without peaked T waves hemodynamically stable but remained on telemetry.  Hyperkalemia improving 5.8 and placed on VELTASSA 16.8 g daily and patient and family were to be instructed also on a low potassium diet restriction .Follow-up renal services in regards to hyperkalemia acute kidney injury no plan for dialysis at this time .  Therapies have been resumed with recommendations for return to CIR. Pt is to be admitted to CIR on 04/18/19.    Glasgow Coma Scale Score: 11  Past Medical History  Past Medical History:  Diagnosis Date  . AKI (acute kidney injury) (HCC)   . CKD (chronic kidney disease) stage 2, GFR 60-89 ml/min   . CVA (cerebral vascular accident) (HCC)   . Hyperlipidemia   . Hypertension   . PVD (peripheral vascular disease) (HCC)     Family History  family history is not on file.  Prior Rehab/Hospitalizations:  Has the patient had prior rehab or hospitalizations prior to admission? Yes  Has the patient had major surgery during 100 days prior to admission? Yes  Current Medications   Current Facility-Administered Medications:  .  0.45 % sodium chloride infusion, , Intravenous, Continuous, Sanford, Ryan B, MD, Last Rate: 50 mL/hr at 04/18/19 1127 .  0.9 %  sodium chloride infusion, 250 mL, Intravenous, PRN, Opyd, Timothy S, MD .  acetaminophen (TYLENOL) tablet 650 mg, 650 mg, Oral, Q6H PRN **OR** acetaminophen (TYLENOL) suppository 650 mg, 650 mg, Rectal, Q6H PRN, Opyd, Timothy S, MD .  aspirin suppository 300 mg, 300 mg, Rectal, Daily **OR** aspirin tablet 325 mg, 325 mg, Oral, Daily, Opyd, Timothy S, MD, 325 mg at 04/18/19 1014 .  atorvastatin (LIPITOR) tablet 80 mg, 80 mg, Oral, q1800, Opyd, Timothy S, MD, 80 mg at 04/17/19 1740 .  clopidogrel (PLAVIX) tablet 75 mg, 75 mg, Oral, Daily, Opyd, Timothy S, MD, 75 mg at 04/18/19 1014 .  dextrose 50 % solution 50 mL, 1 ampule, Intravenous, Once, Foster, Lori C, MD .   heparin injection 5,000 Units, 5,000 Units, Subcutaneous, Q8H, Opyd, Timothy S, MD, 5,000 Units at 04/18/19 0559 .  insulin aspart (novoLOG) injection 5 Units, 5 Units, Intravenous, Once, Foster, Lori C, MD .  MEDLINE mouth rinse, 15 mL, Mouth Rinse, BID, Opyd, Timothy S, MD, 15 mL at 04/17/19 2256 .  metoprolol tartrate (LOPRESSOR) tablet 25 mg, 25 mg, Oral, BID, Opyd, Timothy S, MD, 25 mg at 04/18/19 1014 .  ondansetron (ZOFRAN) tablet 4 mg, 4 mg, Oral, Q6H PRN **OR** ondansetron (ZOFRAN) injection 4   mg, 4 mg, Intravenous, Q6H PRN, Opyd, Timothy S, MD .  patiromer (VELTASSA) packet 16.8 g, 16.8 g, Oral, Daily, Sanford, Ryan B, MD .  sodium chloride flush (NS) 0.9 % injection 3 mL, 3 mL, Intravenous, Q12H, Opyd, Timothy S, MD, 3 mL at 04/14/19 2240 .  sodium chloride flush (NS) 0.9 % injection 3 mL, 3 mL, Intravenous, Q12H, Opyd, Timothy S, MD, 3 mL at 04/17/19 2253 .  sodium chloride flush (NS) 0.9 % injection 3 mL, 3 mL, Intravenous, PRN, Opyd, Timothy S, MD  Patients Current Diet:  Diet Order            DIET - DYS 1 Room service appropriate? No; Fluid consistency: Honey Thick  Diet effective now              Precautions / Restrictions Precautions Precautions: Fall Precaution Comments: Expressive aphasia, R hemiparesis Restrictions Weight Bearing Restrictions: No   Has the patient had 2 or more falls or a fall with injury in the past year?Yes  Prior Activity Level Community (5-7x/wk): retired welder; veteran; drove [employer]  Prior Functional Level Prior Function Level of Independence: Needs assistance Comments: difficult to assess PLOF secondary to communication impairments  Self Care: Did the patient need help bathing, dressing, using the toilet or eating?  Independent  Indoor Mobility: Did the patient need assistance with walking from room to room (with or without device)? Independent  Stairs: Did the patient need assistance with internal or external stairs (with or without  device)? Independent  Functional Cognition: Did the patient need help planning regular tasks such as shopping or remembering to take medications? Independent  Home Assistive Devices / Equipment    Prior Device Use: Indicate devices/aids used by the patient prior to current illness, exacerbation or injury? Walker  Current Functional Level Cognition  Arousal/Alertness: Awake/alert Overall Cognitive Status: Impaired/Different from baseline Difficult to assess due to: Impaired communication Orientation Level: Oriented to person, Oriented to place, Disoriented to time, Disoriented to situation, Other (comment)(expressive aphasia but nods appropriately) Following Commands: Follows one step commands with increased time, Follows multi-step commands inconsistently General Comments: Pt able to nod yes/no to answer simple questions. Pt requiring increased time for ADLs. Pt followims simple cues  Problem Solving: Appears intact(appears intact for basic functional) Safety/Judgment: Appears intact Comments: difficult to assess due to aphasia    Extremity Assessment (includes Sensation/Coordination)  Upper Extremity Assessment: RUE deficits/detail RUE Deficits / Details: Tendency for flexion synergy pattern. Pt able to lift to stedy with Min A to guide positioning.  RUE Coordination: decreased fine motor, decreased gross motor  Lower Extremity Assessment: Defer to PT evaluation RLE Deficits / Details: weakness s/p CVA RLE Coordination: decreased fine motor, decreased gross motor    ADLs  Overall ADL's : Needs assistance/impaired Eating/Feeding: Minimal assistance, Sitting Eating/Feeding Details (indicate cue type and reason): Pt requiring assistance to cut food into bite size pieces. Requiring assistance for bilateral tasks. Pt attempting to bring eggs to mouth with fork and dropping ~25% of food. Cued pt to use hands and eat as a finger food. Pt eating apple sauce with spoon successfully.   Grooming: Wash/dry face, Sitting, Moderate assistance, Maximal assistance(sitting on stedy) Grooming Details (indicate cue type and reason): Pt sitting on stedy at sink and participating in grooming. Challenging sitting balance during ADL. Pt able to sit at sink with Min-Mod A. During ADL, pt requiring Mod A for upright posture with lateral lean right. As he fatigued he required Max A to maintain sitting   posture. Pt using LUE throughout and facilitating weight bearing through RUE on sink.  Upper Body Bathing: Minimal assistance, Sitting Lower Body Bathing: Maximal assistance, Sitting/lateral leans, Sit to/from stand Upper Body Dressing : Minimal assistance, Sitting Lower Body Dressing: Maximal assistance Lower Body Dressing Details (indicate cue type and reason): don socks at bed level Toilet Transfer: Moderate assistance, +2 for physical assistance(sit<>stand with stedy and simulated to recliner) Toilet Transfer Details (indicate cue type and reason): Min A for initial sit<>stand from EOB. As pt fatigued, he required Mod A +2 for sit<>stand from stedy.  Toileting- Clothing Manipulation and Hygiene: Total assistance, Sit to/from stand Functional mobility during ADLs: +2 for physical assistance, +2 for safety/equipment, Minimal assistance, Moderate assistance(stedy for transfers) General ADL Comments: Using stedy for pt to perform grooming at sink. Pt sitting up in recliner at end of session and performing self feeding.     Mobility  Overal bed mobility: Needs Assistance Bed Mobility: Supine to Sit Rolling: Min guard, Min assist Sidelying to sit: Mod assist Supine to sit: Mod assist, +2 for safety/equipment Sit to supine: Mod assist, +2 for physical assistance General bed mobility comments: Mod A for bringing BLEs towards EOB. Pt pushing into upright posture with Min A and then Mod A to weight bear through RUE    Transfers  Overall transfer level: Needs assistance Equipment used: 2 person  hand held assist Transfer via Lift Equipment: Stedy Transfers: Sit to/from Stand Sit to Stand: Mod assist, +2 physical assistance, Min assist General transfer comment: Min A +2 from EOB with use of stedy. After grooming at sink, pt fatigued and requiring Mod A +2 to stand from stedy and transfer to recliner.     Ambulation / Gait / Stairs / Office manager / Balance Dynamic Sitting Balance Sitting balance - Comments: assist required initially and then able to maintain sitting balance EOB with min guard Balance Overall balance assessment: Needs assistance Sitting-balance support: Feet supported, Single extremity supported Sitting balance-Leahy Scale: Fair Sitting balance - Comments: assist required initially and then able to maintain sitting balance EOB with min guard Postural control: Right lateral lean Standing balance support: Bilateral upper extremity supported Standing balance-Leahy Scale: Poor Standing balance comment: Reliant on UE support and physical A; fatigues quickly, especially when participateing in ADL    Special needs/care consideration BiPAP/CPAP: yes CPAP while in acute; at one point used CPAP at home at night, but according to son, no longer has it CPM: no Continuous Drip IV: 0.45% sodium chloride infusion  Dialysis: no        Days: no Life Vest: no Oxygen: no  Special Bed: no Trach Size: no Wound Vac (area): no      Location: no Skin: ecchymosis to Bilateral arms, abrasion to right elbow, left heel and knee                 Bowel mgmt:last BM: 04/15/19, incontinence Bladder mgmt: urinary catheter Diabetic mgmt: no Behavioral consideration : no Chemo/radiation : no     Previous Home Environment (from acute therapy documentation) Living Arrangements: Spouse/significant other Available Help at Discharge: Family Type of Home: House Home Layout: One level Home Access: Stairs to enter Technical brewer of Steps: 2 Bathroom Shower/Tub:  Chiropodist: Standard Additional Comments: Home set-up information taken from CIR evaluation 4 days ago  Discharge Living Setting Plans for Discharge Living Setting: Patient's home, Lives with (comment)(lives with ex-wife) Type of Home at Discharge: [facility]  Discharge Home Layout: One level Discharge Home Access: Stairs to enter, Other (comment)(wife states believes [facility] could help with ramp eventually) Entrance Stairs-Rails: Can reach both Entrance Stairs-Number of Steps: 3-4 Discharge Bathroom Shower/Tub: Tub/shower unit Discharge Bathroom Toilet: Standard Discharge Bathroom Accessibility: Yes How Accessible: Accessible via walker Does the patient have any problems obtaining your medications?: No  Social/Family/Support Systems Patient Roles: Other (Comment)(ex-wife lives with him; has son who lives nearby) Contact Information: ex-wife: Annie [phone]; Donalid (son): [phone] Anticipated Caregiver: Annie and Dayln Anticipated Caregiver's Contact Information: see above Ability/Limitations of Caregiver: Min A-"whatever is needed" Caregiver Availability: 24/7 Discharge Plan Discussed with Primary Caregiver: Yes Is Caregiver In Agreement with Plan?: Yes Does Caregiver/Family have Issues with Lodging/Transportation while Pt is in Rehab?: No   Goals/Additional Needs Patient/Family Goal for Rehab: PT/OT: Min/Mod A; SLP: Min A Expected length of stay: 20-24 days Cultural Considerations: NA Dietary Needs: DYS 1, honey thick liquid (no room service) Equipment Needs: TBD Pt/Family Agrees to Admission and willing to participate: Yes Program Orientation Provided & Reviewed with Pt/Caregiver Including Roles  & Responsibilities: Yes(pt and wife (son informed prior admission))  Barriers to Discharge: Home environment access/layout  Barriers to Discharge Comments: steps to enter; tub shower   Decrease burden of Care through IP rehab admission: NA   Possible need  for SNF placement upon discharge: Not anticipated, pt has good social support at DC through family Pt has good prognosis for further progress through CIR.    Patient Condition: This patient's medical and functional status has changed since the consult dated: 4/17 in which the Rehabilitation Physician determined and documented that the patient's condition is appropriate for intensive rehabilitative care in an inpatient rehabilitation facility. See "History of Present Illness" (above) for medical update. Functional changes since consult are: increased assistance noted in bed mobility from Mod A to Mod A x2, improvement in transfer status from an inability to transfer due to medical condition and elevated HR to Mod A x2 with HHA. Patient's medical and functional status update has been discussed with the Rehabilitation physician and patient remains appropriate for inpatient rehabilitation. Will admit to inpatient rehab today.  Preadmission Screen Completed By:  Kelly Wolfe, OT, 04/18/2019 1:08 PM ______________________________________________________________________   Discussed status with Dr. Swartz on 04/18/19 at 1:08PM and received approval for admission today.  Admission Coordinator:  Kelly Wolfe, time 1:30PM/Date4/28/20  

## 2019-04-18 NOTE — Progress Notes (Signed)
Ranelle Oyster, MD  Physician  Physical Medicine and Rehabilitation  Consult Note  Signed  Date of Service:  04/07/2019 6:43 AM       Related encounter: ED to Hosp-Admission (Discharged) from 04/05/2019 in [facility]      Signed      Expand All Collapse All         Physical Medicine and Rehabilitation Consult Reason for Consult: Right side weakness and slurred speech Referring Physician: [facility]   HPI: Gregory [name] is a 68 y.o.right handed male  With history of chronic kidney disease stage III with creatinine baseline 1.6-1.7, hypertension, hyperlipidemia. Per report patient lives with spouse reportedly independent prior to admission. Presented 04/05/2019 with acute onset of right-sided weakness and slurred speech. Cranial CT scan showed possible early gray-white differentiation loss in the insula on the left. 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 No evidence of gross hemorrhage. MRA negative. Patient did not receive TPA. Noted findings of hyperkalemia 6.8 as well as elevated creatinine 10.48 from baseline 1.70, troponin 3.29. EKG with ST changes. Echocardiogram with ejection fraction of 60% normal systolic function. 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 Cardiology services consulted suspect non-STEMI medical management recommended. Renal ultrasound showed bilateral hydronephrosis with nephrology services consulted as well as urology. A Foley catheter tube was placed and and await plan for possible nephrostomy tubes. Currently on aspirin for CVA prophylaxis. Therapy evaluation completed with recommendations of physical medicine rehabilitation consult.   Review of Systems  Unable to perform ROS: Acuity of condition       Past Medical History:  Diagnosis Date   AKI (acute kidney injury)  (HCC)    CKD (chronic kidney disease) stage 2, GFR 60-89 ml/min    CVA (cerebral vascular accident) (HCC)    Hyperlipidemia    Hypertension    PVD (peripheral vascular disease) (HCC)         Past Surgical History:  Procedure Laterality Date   AORTIC ARCH ANGIOGRAPHY N/A 09/09/2018   Procedure: AORTIC ARCH ANGIOGRAPHY;  Surgeon: Sherren Kerns, MD;  Location: [facility];  Service: Cardiovascular;  Laterality: N/A;   CAROTID ANGIOGRAPHY Bilateral 09/09/2018   Procedure: CAROTID ANGIOGRAPHY;  Surgeon: Sherren Kerns, MD;  Location: [facility];  Service: Cardiovascular;  Laterality: Bilateral;   No family history on file. Social History:  has no history on file for tobacco, alcohol, and drug. Allergies: No Known Allergies       Medications Prior to Admission  Medication Sig Dispense Refill   allopurinol (ZYLOPRIM) 300 MG tablet Take 300 mg by mouth daily.     aspirin EC 81 MG EC tablet Take 1 tablet (81 mg total) by mouth daily. 30 tablet 1   atorvastatin (LIPITOR) 80 MG tablet Take 1 tablet (80 mg total) by mouth daily at 6 PM. 90 tablet 1   hydrochlorothiazide (HYDRODIURIL) 50 MG tablet Take 50 mg by mouth daily.     lisinopril (PRINIVIL,ZESTRIL) 40 MG tablet Take 40 mg by mouth daily.     clopidogrel (PLAVIX) 75 MG tablet Take 1 tablet (75 mg total) by mouth daily. 90 tablet 1   diclofenac (VOLTAREN) 75 MG EC tablet Take 75 mg by mouth at bedtime as needed for mild pain or moderate pain.      Home: Home Living Family/patient expects to be discharged to:: Unsure  Additional Comments: pt with great difficulty speaking but able to nod "yes" and shake head "no" in response to some questions - pt lives with his spouse and was previously independent  Functional History: Prior Function Comments: pt with great difficulty speaking but able to nod "yes" and shake head "no" in response to some questions - pt lives with his spouse and was  previously independent Functional Status:  Mobility: Bed Mobility Overal bed mobility: Needs Assistance Bed Mobility: Supine to Sit, Sit to Supine Supine to sit: Mod assist, HOB elevated Sit to supine: Mod assist General bed mobility comments: increased time and effort, assistance needed for R LE movement off of and onto bed as well as for trunk elevation with use of L UE on bed rail Transfers General transfer comment: deferred due to pt fatigue and fluctuating elevated HR in sitting at EOB (110-130 bpm)  ADL:  Cognition: Cognition Overall Cognitive Status: Difficult to assess Orientation Level: Oriented to person Cognition Arousal/Alertness: Awake/alert Behavior During Therapy: Flat affect Overall Cognitive Status: Difficult to assess Difficult to assess due to: Impaired communication(aphasic)  Blood pressure (!) 170/62, pulse (!) 114, temperature 98.5 F (36.9 C), temperature source Oral, resp. rate 18, weight 88.8 kg, SpO2 98 %. Physical Exam  Constitutional: No distress.  HENT:  Head: Normocephalic.  Eyes: Pupils are equal, round, and reactive to light.  Neck: Normal range of motion.  Cardiovascular:  tachy  Respiratory: Effort normal.  Genitourinary:    Genitourinary Comments: foley   Neurological:  Patient is alert. Appears easily distracted. Remained nonverbal for exam. He did follow some simple verbal commands. Right hemiparesis with inconsistent effort. Withdraws from pain on right side.  Skin: Skin is warm.  Psychiatric:  flat    LabResultsLast24Hours        Results for orders placed or performed during the hospital encounter of 04/05/19 (from the past 24 hour(s))  Basic metabolic panel     Status: Abnormal   Collection Time: 04/06/19  8:18 AM  Result Value Ref Range   Sodium 141 135 - 145 mmol/L   Potassium 5.7 (H) 3.5 - 5.1 mmol/L   Chloride 106 98 - 111 mmol/L   CO2 18 (L) 22 - 32 mmol/L   Glucose, Bld 94 70 - 99 mg/dL   BUN 366 (H)  8 - 23 mg/dL   Creatinine, Ser 4.40 (H) 0.61 - 1.24 mg/dL   Calcium 8.2 (L) 8.9 - 10.3 mg/dL   GFR calc non Af Amer 5 (L) >60 mL/min   GFR calc Af Amer 6 (L) >60 mL/min   Anion gap 17 (H) 5 - 15  Troponin I - Once     Status: Abnormal   Collection Time: 04/06/19  8:55 AM  Result Value Ref Range   Troponin I 17.34 (HH) <0.03 ng/mL  Basic metabolic panel     Status: Abnormal   Collection Time: 04/06/19  1:37 PM  Result Value Ref Range   Sodium 143 135 - 145 mmol/L   Potassium 5.4 (H) 3.5 - 5.1 mmol/L   Chloride 105 98 - 111 mmol/L   CO2 20 (L) 22 - 32 mmol/L   Glucose, Bld 97 70 - 99 mg/dL   BUN 347 (H) 8 - 23 mg/dL   Creatinine, Ser 4.25 (H) 0.61 - 1.24 mg/dL   Calcium 8.3 (L) 8.9 - 10.3 mg/dL   GFR calc non Af Amer 6 (L) >60 mL/min   GFR calc Af Amer 7 (L) >60 mL/min   Anion gap  18 (H) 5 - 15  CBC     Status: Abnormal   Collection Time: 04/06/19  1:37 PM  Result Value Ref Range   WBC 8.3 4.0 - 10.5 K/uL   RBC 2.75 (L) 4.22 - 5.81 MIL/uL   Hemoglobin 7.8 (L) 13.0 - 17.0 g/dL   HCT 16.1 (L) 09.6 - 04.5 %   MCV 88.7 80.0 - 100.0 fL   MCH 28.4 26.0 - 34.0 pg   MCHC 32.0 30.0 - 36.0 g/dL   RDW 40.9 81.1 - 91.4 %   Platelets 179 150 - 400 K/uL   nRBC 0.0 0.0 - 0.2 %  Type and screen [facility]     Status: None   Collection Time: 04/06/19  1:44 PM  Result Value Ref Range   ABO/RH(D) A POS    Antibody Screen NEG    Sample Expiration      04/09/2019 Performed at [facility], [address], [place], [place] [address]   ABO/Rh     Status: None   Collection Time: 04/06/19  1:44 PM  Result Value Ref Range   ABO/RH(D)      A POS Performed at [facility], [address], [place], [place] [address]   Basic metabolic panel     Status: Abnormal   Collection Time: 04/06/19  8:19 PM  Result Value Ref Range   Sodium 143 135 - 145 mmol/L   Potassium 5.5 (H) 3.5 - 5.1 mmol/L   Chloride 103 98 - 111  mmol/L   CO2 24 22 - 32 mmol/L   Glucose, Bld 109 (H) 70 - 99 mg/dL   BUN 865 (H) 8 - 23 mg/dL   Creatinine, Ser 7.84 (H) 0.61 - 1.24 mg/dL   Calcium 8.2 (L) 8.9 - 10.3 mg/dL   GFR calc non Af Amer 6 (L) >60 mL/min   GFR calc Af Amer 7 (L) >60 mL/min   Anion gap 16 (H) 5 - 15  Troponin I - Once-Timed     Status: Abnormal   Collection Time: 04/06/19  8:19 PM  Result Value Ref Range   Troponin I 9.60 (HH) <0.03 ng/mL  CBC     Status: Abnormal   Collection Time: 04/06/19  8:19 PM  Result Value Ref Range   WBC 10.1 4.0 - 10.5 K/uL   RBC 3.58 (L) 4.22 - 5.81 MIL/uL   Hemoglobin 9.9 (L) 13.0 - 17.0 g/dL   HCT 69.6 (L) 29.5 - 28.4 %   MCV 88.3 80.0 - 100.0 fL   MCH 27.7 26.0 - 34.0 pg   MCHC 31.3 30.0 - 36.0 g/dL   RDW 13.2 44.0 - 10.2 %   Platelets 182 150 - 400 K/uL   nRBC 0.0 0.0 - 0.2 %  Glucose, capillary     Status: None   Collection Time: 04/07/19 12:38 AM  Result Value Ref Range   Glucose-Capillary 98 70 - 99 mg/dL  Glucose, capillary     Status: None   Collection Time: 04/07/19  4:48 AM  Result Value Ref Range   Glucose-Capillary 98 70 - 99 mg/dL      [id number])  [name] Head Wo Contrast  Result Date: 04/05/2019 CLINICAL DATA:  Acute presentation with right-sided weakness and speech disturbance. EXAM: MRI HEAD WITHOUT CONTRAST MRA HEAD WITHOUT CONTRAST MRA NECK WITHOUT CONTRAST TECHNIQUE: Multiplanar, multiecho pulse sequences of the brain and surrounding structures were obtained without intravenous contrast. Angiographic images of the Circle of Willis  were obtained using MRA technique without intravenous contrast. Angiographic images of the neck were obtained using MRA technique without intravenous contrast. Carotid stenosis measurements (when applicable) are obtained utilizing NASCET criteria, using the distal internal carotid diameter as the denominator. COMPARISON:  Head CT same day.  MRI 09/03/2018. FINDINGS: MRI HEAD  FINDINGS Brain: Diffusion imaging confirms acute infarction in the left insula and posterior frontal region. The area of involvement measures about 3.5 cm in size. No mass effect or visible hemorrhage. No hydrocephalus. Chronic small-vessel ischemic changes of the deep white matter seen elsewhere. Vascular: Major vessels at the base of the brain show flow. Skull and upper cervical spine: Negative Sinuses/Orbits: Clear/normal Other: None MRA HEAD FINDINGS Both internal carotid arteries are patent through the skull base and siphon regions with atherosclerotic narrowing and irregularity in the siphon regions. Supraclinoid internal carotid arteries are widely patent. The anterior and middle cerebral vessels appear to show flow without visible large or medium vessel occlusion. Distal branch vessels do show some atherosclerotic irregularity. The left vertebral artery supplies the basilar. No antegrade flow seen in the right vertebral artery. The basilar artery shows atherosclerotic narrowing and irregularity diffusely. Posterior circulation branch vessels are patent, with both posterior cerebral arteries receiving most of there supply from the anterior circulation. There is long segment stenosis of the left P1 segment, but good flow seen distal to that. MRA NECK FINDINGS There is antegrade flow in both common carotid arteries. The study suffers from pronounced motion degradation. Antegrade flow is seen in both cervical internal carotid arteries, but accurate evaluation of the carotid bifurcations is not possible. There is antegrade flow in the left vertebral artery but none seen on the right. IMPRESSION: 3.5 cm region of acute infarction affecting the left insula and posterior frontal region. No evidence of gross hemorrhage. MR angiography of the neck markedly degraded by motion. Both internal carotid arteries show antegrade flow. No antegrade flow in the right vertebral artery. Intracranial MR angiography does not show  any large or medium vessel occlusion. Widespread atherosclerotic irregularity of the more distal branch vessels. Electronically Signed   By: Paulina Fusi M.D.   On: 04/05/2019 10:59   [name] Neck Wo Contrast  Result Date: 04/05/2019 CLINICAL DATA:  Acute presentation with right-sided weakness and speech disturbance. EXAM: MRI HEAD WITHOUT CONTRAST MRA HEAD WITHOUT CONTRAST MRA NECK WITHOUT CONTRAST TECHNIQUE: Multiplanar, multiecho pulse sequences of the brain and surrounding structures were obtained without intravenous contrast. Angiographic images of the Circle of Willis were obtained using MRA technique without intravenous contrast. Angiographic images of the neck were obtained using MRA technique without intravenous contrast. Carotid stenosis measurements (when applicable) are obtained utilizing NASCET criteria, using the distal internal carotid diameter as the denominator. COMPARISON:  Head CT same day.  MRI 09/03/2018. FINDINGS: MRI HEAD FINDINGS Brain: Diffusion imaging confirms acute infarction in the left insula and posterior frontal region. The area of involvement measures about 3.5 cm in size. No mass effect or visible hemorrhage. No hydrocephalus. Chronic small-vessel ischemic changes of the deep white matter seen elsewhere. Vascular: Major vessels at the base of the brain show flow. Skull and upper cervical spine: Negative Sinuses/Orbits: Clear/normal Other: None MRA HEAD FINDINGS Both internal carotid arteries are patent through the skull base and siphon regions with atherosclerotic narrowing and irregularity in the siphon regions. Supraclinoid internal carotid arteries are widely patent. The anterior and middle cerebral vessels appear to show flow without visible large or medium vessel occlusion. Distal branch vessels do show  some atherosclerotic irregularity. The left vertebral artery supplies the basilar. No antegrade flow seen in the right vertebral artery. The basilar artery shows  atherosclerotic narrowing and irregularity diffusely. Posterior circulation branch vessels are patent, with both posterior cerebral arteries receiving most of there supply from the anterior circulation. There is long segment stenosis of the left P1 segment, but good flow seen distal to that. MRA NECK FINDINGS There is antegrade flow in both common carotid arteries. The study suffers from pronounced motion degradation. Antegrade flow is seen in both cervical internal carotid arteries, but accurate evaluation of the carotid bifurcations is not possible. There is antegrade flow in the left vertebral artery but none seen on the right. IMPRESSION: 3.5 cm region of acute infarction affecting the left insula and posterior frontal region. No evidence of gross hemorrhage. MR angiography of the neck markedly degraded by motion. Both internal carotid arteries show antegrade flow. No antegrade flow in the right vertebral artery. Intracranial MR angiography does not show any large or medium vessel occlusion. Widespread atherosclerotic irregularity of the more distal branch vessels. Electronically Signed   By: Paulina Fusi M.D.   On: 04/05/2019 10:59   Mr Brain Wo Contrast  Result Date: 04/05/2019 CLINICAL DATA:  Acute presentation with right-sided weakness and speech disturbance. EXAM: MRI HEAD WITHOUT CONTRAST MRA HEAD WITHOUT CONTRAST MRA NECK WITHOUT CONTRAST TECHNIQUE: Multiplanar, multiecho pulse sequences of the brain and surrounding structures were obtained without intravenous contrast. Angiographic images of the Circle of Willis were obtained using MRA technique without intravenous contrast. Angiographic images of the neck were obtained using MRA technique without intravenous contrast. Carotid stenosis measurements (when applicable) are obtained utilizing NASCET criteria, using the distal internal carotid diameter as the denominator. COMPARISON:  Head CT same day.  MRI 09/03/2018. FINDINGS: MRI HEAD FINDINGS Brain:  Diffusion imaging confirms acute infarction in the left insula and posterior frontal region. The area of involvement measures about 3.5 cm in size. No mass effect or visible hemorrhage. No hydrocephalus. Chronic small-vessel ischemic changes of the deep white matter seen elsewhere. Vascular: Major vessels at the base of the brain show flow. Skull and upper cervical spine: Negative Sinuses/Orbits: Clear/normal Other: None MRA HEAD FINDINGS Both internal carotid arteries are patent through the skull base and siphon regions with atherosclerotic narrowing and irregularity in the siphon regions. Supraclinoid internal carotid arteries are widely patent. The anterior and middle cerebral vessels appear to show flow without visible large or medium vessel occlusion. Distal branch vessels do show some atherosclerotic irregularity. The left vertebral artery supplies the basilar. No antegrade flow seen in the right vertebral artery. The basilar artery shows atherosclerotic narrowing and irregularity diffusely. Posterior circulation branch vessels are patent, with both posterior cerebral arteries receiving most of there supply from the anterior circulation. There is long segment stenosis of the left P1 segment, but good flow seen distal to that. MRA NECK FINDINGS There is antegrade flow in both common carotid arteries. The study suffers from pronounced motion degradation. Antegrade flow is seen in both cervical internal carotid arteries, but accurate evaluation of the carotid bifurcations is not possible. There is antegrade flow in the left vertebral artery but none seen on the right. IMPRESSION: 3.5 cm region of acute infarction affecting the left insula and posterior frontal region. No evidence of gross hemorrhage. MR angiography of the neck markedly degraded by motion. Both internal carotid arteries show antegrade flow. No antegrade flow in the right vertebral artery. Intracranial MR angiography does not show any large or  medium vessel occlusion. Widespread atherosclerotic irregularity of the more distal branch vessels. Electronically Signed   By: Paulina Fusi M.D.   On: 04/05/2019 10:59   US Renal  Result Date: 04/05/2019 CLINICAL DATA:  68 year old male with hypertension EXAM: RENAL / URINARY TRACT ULTRASOUND COMPLETE COMPARISON:  None. FINDINGS: Right Kidney: Length: 11.1 cm x 5.3 cm x 6.1 cm, 189 cc. Hydronephrosis. Flow confirmed in the hilum of the right kidney. Left Kidney: Length: 10.9 cm x 5.6 cm x 5.9 cm, 187 cc. Hydronephrosis. Flow confirmed in the hilum of the left kidney. Anechoic cystic structure on the inferior cortex measures 2.6 cm x 1.7 cm with through transmission and no internal color flow. Additional cystic lesion measures 1.1 cm x 1.1 cm x 1.0 cm, with through transmission and no internal flow. Bladder: Catheter in place within the bladder. IMPRESSION: Bilateral hydronephrosis. If there is concern for distal obstruction, CT may be indicated. Left-sided renal cysts. Electronically Signed   By: Gilmer Mor D.O.   On: 04/05/2019 15:39   Dg Chest Port 1 View  Result Date: 04/05/2019 CLINICAL DATA:  Altered behavior. EXAM: PORTABLE CHEST 1 VIEW COMPARISON:  None. FINDINGS: Lungs are adequately inflated and otherwise clear. Borderline cardiomegaly. Mediastinum is within normal. Fusion hardware over the lower cervical spine intact. Minimal degenerative change of the spine. IMPRESSION: No acute findings. Electronically Signed   By: Elberta Fortis M.D.   On: 04/05/2019 11:32   Ct Renal Stone Study  Result Date: 04/06/2019 CLINICAL DATA:  Hydronephrosis. EXAM: CT ABDOMEN AND PELVIS WITHOUT CONTRAST TECHNIQUE: Multidetector CT imaging of the abdomen and pelvis was performed following the standard protocol without IV contrast. COMPARISON:  Ultrasound of April 05, 2019. FINDINGS: Lower chest: No acute abnormality. Hepatobiliary: No focal liver abnormality is seen. No gallstones, gallbladder wall  thickening, or biliary dilatation. Pancreas: Unremarkable. No pancreatic ductal dilatation or surrounding inflammatory changes. Spleen: Normal in size without focal abnormality. Adrenals/Urinary Tract: Adrenal glands appear normal. Moderate bilateral hydroureteronephrosis is noted without evidence of renal or ureteral calculi. Catheter is noted in urinary bladder which appears to be decompressed. However, there appears to be significant diffuse wall thickening of urinary bladder which may be due to lack of distension, but is concerning for cystitis. Clinical correlation is recommended. Stomach/Bowel: Stomach is within normal limits. Appendix appears normal. No evidence of bowel wall thickening, distention, or inflammatory changes. Vascular/Lymphatic: Aortic atherosclerosis. No enlarged abdominal or pelvic lymph nodes. Reproductive: Prostate is unremarkable. Other: No abdominal wall hernia or abnormality. No abdominopelvic ascites. Musculoskeletal: No acute or significant osseous findings. IMPRESSION: Moderate bilateral hydroureteronephrosis is noted without evidence of obstructing calculus. Diffuse wall thickening of urinary bladder is noted which may be due to lack of distension, but is concerning for cystitis. Clinical correlation is recommended. Catheter is noted within urinary bladder. Aortic Atherosclerosis (ICD10-I70.0). Electronically Signed   By: Lupita Raider M.D.   On: 04/06/2019 09:24   Ct Head Code Stroke Wo Contrast  Result Date: 04/05/2019 CLINICAL DATA:  Code stroke. Right-sided weakness. Speech disturbance. Last seen normal 0730 hours. EXAM: CT HEAD WITHOUT CONTRAST TECHNIQUE: Contiguous axial images were obtained from the base of the skull through the vertex without intravenous contrast. COMPARISON:  09/03/2018 FINDINGS: Brain: No CT evidence of acute infarction. Chronic small-vessel ischemic changes of the cerebral hemispheric white matter. No mass lesion, hemorrhage, hydrocephalus or  extra-axial collection. Vascular: No definite hyperdense vessel. One could question slight hyperdensity in the left M1 segment. Skull: Negative Sinuses/Orbits: Clear/normal Other: None ASPECTS (  [place] Stroke Program Early CT Score) - Ganglionic level infarction (caudate, lentiform nuclei, internal capsule, insula, M1-M3 cortex): 6 - Supraganglionic infarction (M4-M6 cortex): 3 Total score (0-10 with 10 being normal): 9 IMPRESSION: 1. Possible early gray-white differentiation loss in the insula on the left. Chronic small-vessel ischemic changes. 2. One could question slight hyperdensity in the left M1 segment. This is not definite. 3. ASPECTS is 9 4. These results were communicated to Dr. Amada JupiterKirkpatrick at 10:01 amon 4/15/2020by text page via the [facility], [employer] messaging system. Electronically Signed   By: Paulina FusiMark  Shogry M.D.   On: 04/05/2019 10:04   Vas [place] Carotid  Result Date: 04/06/2019 Carotid Arterial Duplex Study Indications:  CVA. Risk Factors: Hypertension, hyperlipidemia, prior CVA. Limitations:  Patient positioning, snoring, body habitus Performing Technologist: Blanch MediaMegan Riddle RVS  Examination Guidelines: A complete evaluation includes B-mode imaging, spectral Doppler, color Doppler, and power Doppler as needed of all accessible portions of each vessel. Bilateral testing is considered an integral part of a complete examination. Limited examinations for reoccurring indications may be performed as noted.  Right Carotid Findings: +----------+--------+--------+--------+-----------+--------+             PSV cm/s EDV cm/s Stenosis Describe    Comments  +----------+--------+--------+--------+-----------+--------+  CCA Prox   80       16                homogeneous           +----------+--------+--------+--------+-----------+--------+  CCA Distal 164      24                homogeneous           +----------+--------+--------+--------+-----------+--------+  ICA Prox   188      34       1-39%    homogeneous            +----------+--------+--------+--------+-----------+--------+  ICA Distal 112      22                                      +----------+--------+--------+--------+-----------+--------+  ECA        170      11                                      +----------+--------+--------+--------+-----------+--------+ +----------+--------+-------+--------+-------------------+             PSV cm/s EDV cms Describe Arm Pressure (mmHG)  +----------+--------+-------+--------+-------------------+  Subclavian 77                                             +----------+--------+-------+--------+-------------------+ +---------+--------+--------+--------------+  Vertebral PSV cm/s EDV cm/s Not identified  +---------+--------+--------+--------------+  Left Carotid Findings: +----------+--------+--------+--------+-----------+--------+             PSV cm/s EDV cm/s Stenosis Describe    Comments  +----------+--------+--------+--------+-----------+--------+  CCA Prox   181      26                homogeneous           +----------+--------+--------+--------+-----------+--------+  CCA Distal 118      23  homogeneous           +----------+--------+--------+--------+-----------+--------+  ICA Prox   161      28       1-39%    homogeneous           +----------+--------+--------+--------+-----------+--------+  ICA Distal 152      30                                      +----------+--------+--------+--------+-----------+--------+  ECA        170      12                                      +----------+--------+--------+--------+-----------+--------+ +----------+--------+--------+--------+-------------------+  Subclavian PSV cm/s EDV cm/s Describe Arm Pressure (mmHG)  +----------+--------+--------+--------+-------------------+             121                                             +----------+--------+--------+--------+-------------------+ +---------+--------+--+--------+--+---------+  Vertebral PSV cm/s 52 EDV cm/s 15 Antegrade   +---------+--------+--+--------+--+---------+    Preliminary       Assessment/Plan: Diagnosis: 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 Does the need for close, 24 hr/day medical supervision in concert with the patient's rehab needs make it unreasonable for this patient to be served in a less intensive setting? Yes 2. Co-Morbidities requiring supervision/potential complications: CKD III, HTN, post-stroke sequelae 3. Due to bladder management, bowel management, safety, skin/wound care, disease management, medication administration, pain management and patient education, does the patient require 24 hr/day rehab nursing? Yes 4. Does the patient require coordinated care of a physician, rehab nurse, PT (1-2 hrs/day, 5 days/week), OT (1-2 hrs/day, 5 days/week) and SLP (1-2 hrs/day, 5 days/week) to address physical and functional deficits in the context of the above medical diagnosis(es)? Yes Addressing deficits in the following areas: balance, endurance, locomotion, strength, transferring, bowel/bladder control, bathing, dressing, feeding, grooming, toileting, cognition, language, swallowing and psychosocial support 5. Can the patient actively participate in an intensive therapy program of at least 3 hrs of therapy per day at least 5 days per week? Yes 6. The potential for patient to make measurable gains while on inpatient rehab is excellent 7. Anticipated functional outcomes upon discharge from inpatient rehab are modified independent and supervision  with PT, modified independent and supervision with OT, supervision and min assist with SLP. 8. Estimated rehab length of stay to reach the above functional goals is: 13-20 days 9. Anticipated D/C setting: Home 10. Anticipated post D/C treatments: HH therapy 11. Overall Rehab/Functional Prognosis: good  RECOMMENDATIONS: This patient's condition is appropriate for continued rehabilitative care in the  following setting: CIR Patient has agreed to participate in recommended program. N/A Note that insurance prior authorization may be required for reimbursement for recommended care.  Comment: Rehab Admissions Coordinator to follow up.  Thanks,  Ranelle Oyster, MD, [place] [facility]  I have personally performed a face to face diagnostic evaluation of this patient. Additionally, I have examined pertinent labs and radiographic images. I have reviewed and concur with the physician assistant's documentation above.    Mcarthur Rossetti Angiulli, PA-C 04/07/2019        Revision  History                        Routing History

## 2019-04-18 NOTE — H&P (Signed)
Physical Medicine and Rehabilitation Admission H&P     Chief complaint: Weakness HPI: Gregory [name] is a 68 year old right-handed male with history of chronic kidney disease stage III with creatinine baseline 1.6-1.7, hypertension maintained on HCTZ and lisinopril, hyperlipidemia.  Per chart review lives with spouse reportedly independent prior to admission.  Presented 04/05/2019 with acute onset of right-sided weakness and slurred speech.  Cranial CT scan showed possible early gray-white differentiation loss in the insula on the left.  Chronic small vessel ischemic changes.  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  No evidence of gross hemorrhage.  MRA negative.  Patient did not receive TPA.  Noted findings of hyperkalemia 6.8 as well as elevated creatinine 10.4 from baseline 1.70, troponin 3.29.  EKG with ST changes.  Cardiology services consulted for suspect non-STEMI medical management recommended.  Patient did undergo placement of loop recorder 04/07/2019 per Dr. Ladona Ridgel.  Renal ultrasound showed bilateral hydronephrosis with nephrology services consulted as well as urology.  A Foley catheter tube was placed and would remain in place until follow-up outpatient urology.  Renal function continued to improve and monitored with creatinine 4.08.  Maintained on aspirin for CVA prophylaxis.  Subcutaneous heparin for DVT prophylaxis.  Dysphagia #1 honey thick liquid diet.  Patient was admitted to inpatient rehab services 04/10/2019 with steady progressive gains.  Noted on 04/14/2019 with noted ongoing bouts of hyperkalemia and did not respond as expected to Lasix..  Nephrology recommended Kayexalate enema and insulin with dextrose.  Patient became more confused and it was felt would benefit from discharge to acute care services for ongoing monitoring.  EKG follow-up without peaked T waves hemodynamically stable but remained on telemetry.   Hyperkalemia improving 5.8 and placed on VELTASSA 16.8 g daily and patient and family were to be instructed also on a low potassium diet restriction .Follow-up renal services in regards to hyperkalemia acute kidney injury no plan for dialysis at this time .  Therapies have been resumed and patient was readmitted to inpatient rehab services for ongoing therapies.   Review of Systems  Constitutional: Negative for chills and fever.  HENT: Negative for hearing loss.   Eyes: Negative for blurred vision and double vision.  Respiratory: Negative for shortness of breath.   Cardiovascular: Positive for palpitations and leg swelling. Negative for chest pain.  Gastrointestinal: Positive for constipation and nausea. Negative for heartburn and vomiting.  Genitourinary: Positive for urgency. Negative for dysuria, flank pain and hematuria.  Musculoskeletal: Positive for joint pain and myalgias.  Skin: Negative for rash.  Neurological: Positive for weakness.  All other systems reviewed and are negative.       Past Medical History:  Diagnosis Date   AKI (acute kidney injury) (HCC)     CKD (chronic kidney disease) stage 2, GFR 60-89 ml/min     CVA (cerebral vascular accident) (HCC)     Hyperlipidemia     Hypertension     PVD (peripheral vascular disease) (HCC)           Past Surgical History:  Procedure Laterality Date   AORTIC ARCH ANGIOGRAPHY N/A 09/09/2018    Procedure: AORTIC ARCH ANGIOGRAPHY;  Surgeon: Sherren Kerns, MD;  Location: [facility] INVASIVE CV LAB;  Service: Cardiovascular;  Laterality: N/A;   CAROTID ANGIOGRAPHY Bilateral 09/09/2018    Procedure: CAROTID ANGIOGRAPHY;  Surgeon: Sherren Kerns, MD;  Location: [facility] INVASIVE CV LAB;  Service: Cardiovascular;  Laterality: Bilateral;  LOOP RECORDER INSERTION N/A 04/07/2019    Procedure: LOOP RECORDER INSERTION;  Surgeon: Marinus Maw, MD;  Location: [facility] INVASIVE CV LAB;  Service: Cardiovascular;  Laterality: N/A;    History  reviewed. No pertinent family history. Social History:  has no history on file for tobacco, alcohol, and drug. Allergies: No Known Allergies       Medications Prior to Admission  Medication Sig Dispense Refill   acetaminophen (TYLENOL) 325 MG tablet Take 2 tablets (650 mg total) by mouth every 4 (four) hours as needed for mild pain, fever or headache. 30 tablet 2   allopurinol (ZYLOPRIM) 300 MG tablet Take 300 mg by mouth daily.       aspirin EC 81 MG tablet Take 1 tablet (81 mg total) by mouth daily with breakfast. 30 tablet 2   atorvastatin (LIPITOR) 80 MG tablet Take 1 tablet (80 mg total) by mouth daily at 6 PM. (Patient taking differently: Take 80 mg by mouth at bedtime. ) 90 tablet 1   lisinopril (ZESTRIL) 40 MG tablet Take 40 mg by mouth daily.       vardenafil (LEVITRA) 20 MG tablet Take 20 mg by mouth daily as needed for erectile dysfunction.       clopidogrel (PLAVIX) 75 MG tablet Take 1 tablet (75 mg total) by mouth daily. 30 tablet 11   metoprolol tartrate (LOPRESSOR) 25 MG tablet Take 1 tablet (25 mg total) by mouth 3 (three) times daily. (Patient not taking: Reported on 04/15/2019) 90 tablet 2      Drug Regimen Review Drug regimen was reviewed and remains appropriate with no significant issues identified   Home: Home Living Family/patient expects to be discharged to:: Private residence Living Arrangements: Spouse/significant other Available Help at Discharge: Family Type of Home: House Home Access: Stairs to enter Secretary/administrator of Steps: 2 Home Layout: One level Bathroom Shower/Tub: Engineer, manufacturing systems: Standard Additional Comments: Home set-up information taken from CIR evaluation 4 days ago   Functional History: Prior Function Level of Independence: Needs assistance Comments: difficult to assess PLOF secondary to communication impairments   Functional Status:  Mobility: Bed Mobility Overal bed mobility: Needs Assistance Bed  Mobility: Supine to Sit Rolling: Min guard, Min assist Sidelying to sit: Mod assist Supine to sit: Mod assist, +2 for safety/equipment Sit to supine: Mod assist, +2 for physical assistance General bed mobility comments: Mod A for bringing BLEs towards EOB. Pt pushing into upright posture with Min A and then Mod A to weight bear through RUE Transfers Overall transfer level: Needs assistance Equipment used: 2 person hand held assist Transfer via Lift Equipment: Stedy Transfers: Sit to/from Stand Sit to Stand: Mod assist, +2 physical assistance, Min assist General transfer comment: Min A +2 from EOB with use of stedy. After grooming at sink, pt fatigued and requiring Mod A +2 to stand from stedy and transfer to recliner.    ADL: ADL Overall ADL's : Needs assistance/impaired Eating/Feeding: Minimal assistance, Sitting Eating/Feeding Details (indicate cue type and reason): Pt requiring assistance to cut food into bite size pieces. Requiring assistance for bilateral tasks. Pt attempting to bring eggs to mouth with fork and dropping ~25% of food. Cued pt to use hands and eat as a finger food. Pt eating apple sauce with spoon successfully.  Grooming: Wash/dry face, Sitting, Moderate assistance, Maximal assistance(sitting on stedy) Grooming Details (indicate cue type and reason): Pt sitting on stedy at sink and participating in grooming. Challenging sitting balance during ADL. Pt able to  sit at sink with Min-Mod A. During ADL, pt requiring Mod A for upright posture with lateral lean right. As he fatigued he required Max A to maintain sitting posture. Pt using LUE throughout and facilitating weight bearing through RUE on sink.  Upper Body Bathing: Minimal assistance, Sitting Lower Body Bathing: Maximal assistance, Sitting/lateral leans, Sit to/from stand Upper Body Dressing : Minimal assistance, Sitting Lower Body Dressing: Maximal assistance Lower Body Dressing Details (indicate cue type and reason):  don socks at bed level Toilet Transfer: Moderate assistance, +2 for physical assistance(sit<>stand with stedy and simulated to recliner) Toilet Transfer Details (indicate cue type and reason): Min A for initial sit<>stand from EOB. As pt fatigued, he required Mod A +2 for sit<>stand from stedy.  Toileting- Clothing Manipulation and Hygiene: Total assistance, Sit to/from stand Functional mobility during ADLs: +2 for physical assistance, +2 for safety/equipment, Minimal assistance, Moderate assistance(stedy for transfers) General ADL Comments: Using stedy for pt to perform grooming at sink. Pt sitting up in recliner at end of session and performing self feeding.    Cognition: Cognition Overall Cognitive Status: Impaired/Different from baseline Arousal/Alertness: Awake/alert Orientation Level: Oriented to person, Oriented to place, Disoriented to time, Disoriented to situation, Other (comment)(expressive aphasia but nods appropriately) Problem Solving: Appears intact(appears intact for basic functional) Safety/Judgment: Appears intact Comments: difficult to assess due to aphasia Cognition Arousal/Alertness: Awake/alert Behavior During Therapy: WFL for tasks assessed/performed Overall Cognitive Status: Impaired/Different from baseline Area of Impairment: Following commands, Problem solving, Awareness Following Commands: Follows one step commands with increased time, Follows multi-step commands inconsistently Awareness: Emergent Problem Solving: Requires verbal cues General Comments: Pt able to nod yes/no to answer simple questions. Pt requiring increased time for ADLs. Pt followims simple cues  Difficult to assess due to: Impaired communication   Physical Exam: Blood pressure (!) 158/96, pulse 94, temperature 98.1 F (36.7 C), temperature source Oral, resp. rate 18, weight 83.7 kg, SpO2 91 %. Physical Exam  Constitutional: He appears well-developed.  obese  HENT:  Head: Normocephalic and  atraumatic.  Eyes: Pupils are equal, round, and reactive to light. Left eye exhibits no discharge.  Neck: Normal range of motion.  Cardiovascular: Normal rate. Exam reveals no friction rub.  No murmur heard. Respiratory: Effort normal. No respiratory distress. He has no wheezes. He has no rales.  GI: Soft. He exhibits no distension. There is no abdominal tenderness.  Musculoskeletal:     Comments: Lower ext edema 1 + bilaterally  Neurological: He is alert.  Patient is alert he is aphasic expressive greater than receptive. Unable to stick out tongue or hold up two fingers. Could respond with head nods yes/no fairly accurately. Speech very dysarthric. RUE with flexor tone, biceps 2-3/4. Minimal voluntary movement RUE. RLE 2/5. DTR's 3+ RUE and RLE. Decreased sense to pain RUE and RLE.  LUE and LLE 4-5/5.   Skin: Skin is warm and dry.  Psychiatric: He has a normal mood and affect. His behavior is normal.      Lab Results Last 48 Hours        Results for orders placed or performed during the hospital encounter of 04/14/19 (from the past 48 hour(s))  Potassium     Status: Abnormal    Collection Time: 04/16/19  9:01 AM  Result Value Ref Range    Potassium 6.0 (H) 3.5 - 5.1 mmol/L      Comment: Performed at [facility], [address], [place], [place] [address]  Basic metabolic panel  Status: Abnormal    Collection Time: 04/16/19 12:00 PM  Result Value Ref Range    Sodium 142 135 - 145 mmol/L    Potassium 5.6 (H) 3.5 - 5.1 mmol/L    Chloride 105 98 - 111 mmol/L    CO2 23 22 - 32 mmol/L    Glucose, Bld 108 (H) 70 - 99 mg/dL    BUN 84 (H) 8 - 23 mg/dL    Creatinine, Ser 4.09 (H) 0.61 - 1.24 mg/dL    Calcium 9.4 8.9 - 81.1 mg/dL    GFR calc non Af Amer 13 (L) >60 mL/min    GFR calc Af Amer 15 (L) >60 mL/min    Anion gap 14 5 - 15      Comment: Performed at [facility], [address] [place], [place] [address]  CBC     Status: Abnormal    Collection Time: 04/17/19   4:49 AM  Result Value Ref Range    WBC 8.1 4.0 - 10.5 K/uL    RBC 3.24 (L) 4.22 - 5.81 MIL/uL    Hemoglobin 9.1 (L) 13.0 - 17.0 g/dL    HCT 29.5 (L) 62.1 - 52.0 %    MCV 95.1 80.0 - 100.0 fL    MCH 28.1 26.0 - 34.0 pg    MCHC 29.5 (L) 30.0 - 36.0 g/dL    RDW 30.8 65.7 - 84.6 %    Platelets 219 150 - 400 K/uL    nRBC 0.0 0.0 - 0.2 %      Comment: Performed at [facility], [address] [place], [place] [address]  Comprehensive metabolic panel     Status: Abnormal    Collection Time: 04/17/19  4:49 AM  Result Value Ref Range    Sodium 143 135 - 145 mmol/L    Potassium 5.5 (H) 3.5 - 5.1 mmol/L    Chloride 104 98 - 111 mmol/L    CO2 26 22 - 32 mmol/L    Glucose, Bld 95 70 - 99 mg/dL    BUN 80 (H) 8 - 23 mg/dL    Creatinine, Ser 2.84 (H) 0.61 - 1.24 mg/dL    Calcium 8.8 (L) 8.9 - 10.3 mg/dL    Total Protein 6.8 6.5 - 8.1 g/dL    Albumin 2.5 (L) 3.5 - 5.0 g/dL    AST 82 (H) 15 - 41 U/L    ALT 104 (H) 0 - 44 U/L    Alkaline Phosphatase 119 38 - 126 U/L    Total Bilirubin 0.5 0.3 - 1.2 mg/dL    GFR calc non Af Amer 14 (L) >60 mL/min    GFR calc Af Amer 16 (L) >60 mL/min    Anion gap 13 5 - 15      Comment: Performed at [facility], [address] [place], [place] [address]  Potassium     Status: Abnormal    Collection Time: 04/17/19  1:00 PM  Result Value Ref Range    Potassium 6.0 (H) 3.5 - 5.1 mmol/L      Comment: Performed at [facility], [address] [place], [place] [address]  Comprehensive metabolic panel     Status: Abnormal    Collection Time: 04/18/19  2:51 AM  Result Value Ref Range    Sodium 140 135 - 145 mmol/L    Potassium 5.8 (H) 3.5 - 5.1 mmol/L    Chloride 104 98 - 111 mmol/L    CO2  24 22 - 32 mmol/L    Glucose, Bld 96 70 - 99 mg/dL    BUN 75 (H) 8 - 23 mg/dL    Creatinine, Ser 4.094.01 (H) 0.61 - 1.24 mg/dL    Calcium 9.0 8.9 - 81.110.3 mg/dL    Total Protein 7.0 6.5 - 8.1 g/dL    Albumin 2.6 (L) 3.5 - 5.0 g/dL    AST 74 (H) 15 - 41 U/L      ALT 94 (H) 0 - 44 U/L    Alkaline Phosphatase 130 (H) 38 - 126 U/L    Total Bilirubin 0.5 0.3 - 1.2 mg/dL    GFR calc non Af Amer 14 (L) >60 mL/min    GFR calc Af Amer 17 (L) >60 mL/min    Anion gap 12 5 - 15      Comment: Performed at [facility], [address] [place], [place] [address]  CBC     Status: Abnormal    Collection Time: 04/18/19  2:51 AM  Result Value Ref Range    WBC 9.1 4.0 - 10.5 K/uL    RBC 3.23 (L) 4.22 - 5.81 MIL/uL    Hemoglobin 9.1 (L) 13.0 - 17.0 g/dL    HCT 29.530.5 (L) 62.139.0 - 52.0 %    MCV 94.4 80.0 - 100.0 fL    MCH 28.2 26.0 - 34.0 pg    MCHC 29.8 (L) 30.0 - 36.0 g/dL    RDW 30.813.3 65.711.5 - 84.615.5 %    Platelets 230 150 - 400 K/uL    nRBC 0.0 0.0 - 0.2 %      Comment: Performed at [facility], [address] [place], [place] [address]       Imaging Results (Last 48 hours)  Ct Abdomen Pelvis Wo Contrast   Result Date: 04/16/2019 CLINICAL DATA:  Acute kidney injury, bilateral hydronephrosis and bladder outlet obstruction. EXAM: CT ABDOMEN AND PELVIS WITHOUT CONTRAST TECHNIQUE: Multidetector CT imaging of the abdomen and pelvis was performed following the standard protocol without IV contrast. COMPARISON:  CT of the abdomen and pelvis without contrast on 04/06/2019 and renal ultrasound on 04/05/2019. FINDINGS: Lower chest: No acute abnormality. Hepatobiliary: The liver is unremarkable. The gallbladder is nondistended and likely contains some tiny dependent calculi. No evidence of biliary ductal dilatation. Pancreas: Unremarkable. No pancreatic ductal dilatation or surrounding inflammatory changes. Spleen: Normal in size without focal abnormality. Adrenals/Urinary Tract: The kidneys demonstrate no hydronephrosis. The ureters are nondilated. No calculi identified. The bladder is largely decompressed by a Foley catheter. There is evidence persistent circumferential wall thickening likely on the basis of chronic outlet obstruction. Adrenal glands remain  unremarkable bilaterally. Stomach/Bowel: Bowel shows no evidence of obstruction, ileus, inflammation or lesion. No free air identified. Vascular/Lymphatic: Calcified plaque throughout much of the abdominal aorta, visceral arteries and iliac arteries as well as the common femoral arteries. No evidence of aneurysmal disease. No enlarged lymph nodes identified in the abdomen or pelvis. Reproductive: The prostate gland appears mild to moderately enlarged. Other: No abdominal wall hernia or abnormality. No abdominopelvic ascites. Musculoskeletal: No acute or significant osseous findings. IMPRESSION: 1. No evidence of recurrent hydronephrosis. The bladder is decompressed by a Foley catheter and demonstrates persistent circumferential wall thickening, likely on the basis of chronic outlet obstruction. 2. Probable small dependent calculi in the gallbladder. Electronically Signed   By: Irish LackGlenn  Yamagata M.D.   On: 04/16/2019 12:29             Medical  Problem List and Plan: 1.  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  Status post loop recorder             -admit to inpatient rehab 2.  Antithrombotics: -DVT/anticoagulation: Subcutaneous heparin.             -antiplatelet therapy: Aspirin 325 mg daily, Plavix 75 mg daily 3. Pain Management: Tylenol as needed 4. Mood: Provide emotional support             -antipsychotic agents: N/A 5. Neuropsych: This patient is capable of making decisions on his own behalf. 6. Skin/Wound Care: Routine skin checks 7. Fluids/Electrolytes/Nutrition: Routine in and outs with follow-up chemistries 8.  Dysphagia.  Dysphagia #1 honey thick liquids.  Monitor hydration/labs. 9.  Acute on chronic kidney disease stage III/hyperkalemia with obstructive uropathy/BPH.  Follow-up urology services as well as nephrology services.             -FOLEY TUBE TO REMAIN IN PLACE UNTIL FOLLOW UP OUT PATIENT WITH  UROLOGY 10.  Hyperlipidemia.  Lipitor 11.  Hypertension.  Lopressor 25 mg twice daily.  Monitor with increased mobility 12.  OSA.  Continue CPAP     Post Admission Physician Evaluation: 1. Functional deficits secondary  to left insular, and posterior frontal infarct, embolic. 2. Patient is admitted to receive collaborative, interdisciplinary care between the physiatrist, rehab nursing staff, and therapy team. 3. Patient's level of medical complexity and substantial therapy needs in context of that medical necessity cannot be provided at a lesser intensity of care such as a SNF. 4. Patient has experienced substantial functional loss from his/her baseline which was documented above under the "Functional History" and "Functional Status" headings.  Judging by the patient's diagnosis, physical exam, and functional history, the patient has potential for functional progress which will result in measurable gains while on inpatient rehab.  These gains will be of substantial and practical use upon discharge  in facilitating mobility and self-care at the household level. 5. Physiatrist will provide 24 hour management of medical needs as well as oversight of the therapy plan/treatment and provide guidance as appropriate regarding the interaction of the two. 6. The Preadmission Screening has been reviewed and patient status is unchanged unless otherwise stated above. 7. 24 hour rehab nursing will assist with bladder management, bowel management, safety, skin/wound care, disease management, medication administration, pain management and patient education  and help integrate therapy concepts, techniques,education, etc. 8. PT will assess and treat for/with: Lower extremity strength, range of motion, stamina, balance, functional mobility, safety, adaptive techniques and equipment, NMR, family ed.   Goals are: min to mod assist. 9. OT will assess and treat for/with: ADL's, functional mobility, safety, upper extremity  strength, adaptive techniques and equipment, NMR, family ed.   Goals are: min to mod assist. Therapy may proceed with showering this patient. 10. SLP will assess and treat for/with: cognition, communication, language.  Goals are: min assist. 11. Case Management and Social Worker will assess and treat for psychological issues and discharge planning. 12. Team conference will be held weekly to assess progress toward goals and to determine barriers to discharge. 13. Patient will receive at least 3 hours of therapy per day at least 5 days per week. 14. ELOS: 20-24 days       15. Prognosis:  excellent   I have personally performed a face to face diagnostic evaluation of this patient and formulated the key components of the plan.  Additionally, I have personally reviewed laboratory data, imaging studies, as well as relevant notes and concur with the physician assistant's documentation above.  Ranelle Oyster, MD, FAAPMR     Mcarthur Rossetti Angiulli, PA-C 04/18/2019

## 2019-04-18 NOTE — Discharge Summary (Signed)
Physician Discharge Summary  Moss Berry ZOX:[id number] DOB: [birth year] DOA: 04/14/2019  PCP: System, Pcp Not In  Admit date: 04/14/2019 Discharge date: 04/18/2019  Admitted From: CIR Disposition:  CIR  Recommendations for Outpatient Follow-up:  1. Follow up with PCP in 1-2 weeks when discharged home  Discharge Condition:Stable CODE STATUS:Full Diet recommendation: Dysphagia 1 with honey thick liquids   Brief/Interim Summary: 68 y.o.malewith medical history significant forCKDIII, hypertension, and hyperlipidemia who was admitted to the hospital on 04/05/2019 with acute onset of expressive aphasia and right-sided weakness, and was admitted with acute CVA. He was also found to have acute kidney injury on admission with hyperkalemia, and the admission was alsocomplicated by NSTEMI.Marland Kitchen He was discharged to inpatient rehabilitation on 04/10/2019,to continue with rehabilitation when he developed hypokalemia. Nephrology was following and attempted to treat hyperkalemia while in rehab. Rehab advised that patient need to be readmitted in the hospital for treatment of hyperkalemia.  Discharge Diagnoses:  Principal Problem:   Hyperkalemia Active Problems:   Acute renal failure superimposed on stage 3 chronic kidney disease (HCC)   Essential hypertension   Stroke (cerebrum) (HCC)   Multifocal atrial tachycardia (HCC)   Embolic cerebral infarction (HCC)   Bladder outlet obstruction   #1. Hyperkalemia without EKG changes. He is status post Kayexalate enema, insulin anddextrose. Healsoreceived a dose of Lasix Patient received a dose of Veltassa 8.4 g I will continue daily by mouth. Nephrology on board. Recommend continuing to follow serial BMET  #2. Acute kidney injury superimposed on chronic kidney disease stage III. Patient was found to have bilateral hydronephrosis due to bladder outlet obstruction. He is status post Foley catheter placement with gradual improvement in  renal function back to baseline. Nephrology is following. Recommend following renal function closely  #3. Cerebrovascular accident (stroke-04/06/2019) with acute aphasia and right hemiparesis. Currently undergoing rehabilitation inpatient. Patient has significant aphasia and dysphagia. Able to swallow medications with applesauce and comprehensible but unable to verbalize. Continue with aspirin, Plavix and statin secondary stroke prevention Continue with PT/OT Return to CIR  #4. Multifactorial atrial tachycardia controlled with metoprolol  #5. Hypertension BP currently at goal Continue with  hypertensive medication-metoprolol  #6.Anemia of chronic kidney disease Hemodynamically stable at this time  #7.Obstructive sleep apnea CPAP nightly and PRN.  #8. Bladder outlet obstruction with resultant bilateral hydronephrosis. Status post Foley catheter placement. Stable at present. Per Nephrology, recommendation for Urology follow up as outpatient   Discharge Instructions   Allergies as of 04/18/2019   No Known Allergies     Medication List    STOP taking these medications   allopurinol 300 MG tablet Commonly known as:  ZYLOPRIM   lisinopril 40 MG tablet Commonly known as:  ZESTRIL   vardenafil 20 MG tablet Commonly known as:  LEVITRA     TAKE these medications   acetaminophen 325 MG tablet Commonly known as:  TYLENOL Take 2 tablets (650 mg total) by mouth every 4 (four) hours as needed for mild pain, fever or headache.   aspirin EC 81 MG tablet Take 1 tablet (81 mg total) by mouth daily with breakfast.   atorvastatin 80 MG tablet Commonly known as:  LIPITOR Take 1 tablet (80 mg total) by mouth daily at 6 PM. What changed:  when to take this   clopidogrel 75 MG tablet Commonly known as:  Plavix Take 1 tablet (75 mg total) by mouth daily.   metoprolol tartrate 25 MG tablet Commonly known as:  LOPRESSOR Take 1 tablet (25 mg total) by  mouth 2 (two)  times daily for 30 days. What changed:  when to take this   patiromer 8.4 g packet Commonly known as:  VELTASSA Take 2 packets (16.8 g total) by mouth daily for 30 days.      Follow-up Information    Follow up with PCP when discharged home Follow up.          No Known Allergies  Consultations:  Nephrology  CIR  Procedures/Studies: Ct Abdomen Pelvis Wo Contrast  Result Date: 04/16/2019 CLINICAL DATA:  Acute kidney injury, bilateral hydronephrosis and bladder outlet obstruction. EXAM: CT ABDOMEN AND PELVIS WITHOUT CONTRAST TECHNIQUE: Multidetector CT imaging of the abdomen and pelvis was performed following the standard protocol without IV contrast. COMPARISON:  CT of the abdomen and pelvis without contrast on 04/06/2019 and renal ultrasound on 04/05/2019. FINDINGS: Lower chest: No acute abnormality. Hepatobiliary: The liver is unremarkable. The gallbladder is nondistended and likely contains some tiny dependent calculi. No evidence of biliary ductal dilatation. Pancreas: Unremarkable. No pancreatic ductal dilatation or surrounding inflammatory changes. Spleen: Normal in size without focal abnormality. Adrenals/Urinary Tract: The kidneys demonstrate no hydronephrosis. The ureters are nondilated. No calculi identified. The bladder is largely decompressed by a Foley catheter. There is evidence persistent circumferential wall thickening likely on the basis of chronic outlet obstruction. Adrenal glands remain unremarkable bilaterally. Stomach/Bowel: Bowel shows no evidence of obstruction, ileus, inflammation or lesion. No free air identified. Vascular/Lymphatic: Calcified plaque throughout much of the abdominal aorta, visceral arteries and iliac arteries as well as the common femoral arteries. No evidence of aneurysmal disease. No enlarged lymph nodes identified in the abdomen or pelvis. Reproductive: The prostate gland appears mild to moderately enlarged. Other: No abdominal wall hernia or  abnormality. No abdominopelvic ascites. Musculoskeletal: No acute or significant osseous findings. IMPRESSION: 1. No evidence of recurrent hydronephrosis. The bladder is decompressed by a Foley catheter and demonstrates persistent circumferential wall thickening, likely on the basis of chronic outlet obstruction. 2. Probable small dependent calculi in the gallbladder. Electronically Signed   By: Irish Lack M.D.   On: 04/16/2019 12:29   [name] Head Wo Contrast  Result Date: 04/05/2019 CLINICAL DATA:  Acute presentation with right-sided weakness and speech disturbance. EXAM: MRI HEAD WITHOUT CONTRAST MRA HEAD WITHOUT CONTRAST MRA NECK WITHOUT CONTRAST TECHNIQUE: Multiplanar, multiecho pulse sequences of the brain and surrounding structures were obtained without intravenous contrast. Angiographic images of the Circle of Willis were obtained using MRA technique without intravenous contrast. Angiographic images of the neck were obtained using MRA technique without intravenous contrast. Carotid stenosis measurements (when applicable) are obtained utilizing NASCET criteria, using the distal internal carotid diameter as the denominator. COMPARISON:  Head CT same day.  MRI 09/03/2018. FINDINGS: MRI HEAD FINDINGS Brain: Diffusion imaging confirms acute infarction in the left insula and posterior frontal region. The area of involvement measures about 3.5 cm in size. No mass effect or visible hemorrhage. No hydrocephalus. Chronic small-vessel ischemic changes of the deep white matter seen elsewhere. Vascular: Major vessels at the base of the brain show flow. Skull and upper cervical spine: Negative Sinuses/Orbits: Clear/normal Other: None MRA HEAD FINDINGS Both internal carotid arteries are patent through the skull base and siphon regions with atherosclerotic narrowing and irregularity in the siphon regions. Supraclinoid internal carotid arteries are widely patent. The anterior and middle cerebral vessels appear to  show flow without visible large or medium vessel occlusion. Distal branch vessels do show some atherosclerotic irregularity. The left vertebral artery supplies the basilar. No antegrade  flow seen in the right vertebral artery. The basilar artery shows atherosclerotic narrowing and irregularity diffusely. Posterior circulation branch vessels are patent, with both posterior cerebral arteries receiving most of there supply from the anterior circulation. There is long segment stenosis of the left P1 segment, but good flow seen distal to that. MRA NECK FINDINGS There is antegrade flow in both common carotid arteries. The study suffers from pronounced motion degradation. Antegrade flow is seen in both cervical internal carotid arteries, but accurate evaluation of the carotid bifurcations is not possible. There is antegrade flow in the left vertebral artery but none seen on the right. IMPRESSION: 3.5 cm region of acute infarction affecting the left insula and posterior frontal region. No evidence of gross hemorrhage. MR angiography of the neck markedly degraded by motion. Both internal carotid arteries show antegrade flow. No antegrade flow in the right vertebral artery. Intracranial MR angiography does not show any large or medium vessel occlusion. Widespread atherosclerotic irregularity of the more distal branch vessels. Electronically Signed   By: Paulina Fusi M.D.   On: 04/05/2019 10:59   [name] Neck Wo Contrast  Result Date: 04/05/2019 CLINICAL DATA:  Acute presentation with right-sided weakness and speech disturbance. EXAM: MRI HEAD WITHOUT CONTRAST MRA HEAD WITHOUT CONTRAST MRA NECK WITHOUT CONTRAST TECHNIQUE: Multiplanar, multiecho pulse sequences of the brain and surrounding structures were obtained without intravenous contrast. Angiographic images of the Circle of Willis were obtained using MRA technique without intravenous contrast. Angiographic images of the neck were obtained using MRA technique without  intravenous contrast. Carotid stenosis measurements (when applicable) are obtained utilizing NASCET criteria, using the distal internal carotid diameter as the denominator. COMPARISON:  Head CT same day.  MRI 09/03/2018. FINDINGS: MRI HEAD FINDINGS Brain: Diffusion imaging confirms acute infarction in the left insula and posterior frontal region. The area of involvement measures about 3.5 cm in size. No mass effect or visible hemorrhage. No hydrocephalus. Chronic small-vessel ischemic changes of the deep white matter seen elsewhere. Vascular: Major vessels at the base of the brain show flow. Skull and upper cervical spine: Negative Sinuses/Orbits: Clear/normal Other: None MRA HEAD FINDINGS Both internal carotid arteries are patent through the skull base and siphon regions with atherosclerotic narrowing and irregularity in the siphon regions. Supraclinoid internal carotid arteries are widely patent. The anterior and middle cerebral vessels appear to show flow without visible large or medium vessel occlusion. Distal branch vessels do show some atherosclerotic irregularity. The left vertebral artery supplies the basilar. No antegrade flow seen in the right vertebral artery. The basilar artery shows atherosclerotic narrowing and irregularity diffusely. Posterior circulation branch vessels are patent, with both posterior cerebral arteries receiving most of there supply from the anterior circulation. There is long segment stenosis of the left P1 segment, but good flow seen distal to that. MRA NECK FINDINGS There is antegrade flow in both common carotid arteries. The study suffers from pronounced motion degradation. Antegrade flow is seen in both cervical internal carotid arteries, but accurate evaluation of the carotid bifurcations is not possible. There is antegrade flow in the left vertebral artery but none seen on the right. IMPRESSION: 3.5 cm region of acute infarction affecting the left insula and posterior frontal  region. No evidence of gross hemorrhage. MR angiography of the neck markedly degraded by motion. Both internal carotid arteries show antegrade flow. No antegrade flow in the right vertebral artery. Intracranial MR angiography does not show any large or medium vessel occlusion. Widespread atherosclerotic irregularity of the more distal branch  vessels. Electronically Signed   By: Paulina Fusi M.D.   On: 04/05/2019 10:59   Mr Brain Wo Contrast  Result Date: 04/05/2019 CLINICAL DATA:  Acute presentation with right-sided weakness and speech disturbance. EXAM: MRI HEAD WITHOUT CONTRAST MRA HEAD WITHOUT CONTRAST MRA NECK WITHOUT CONTRAST TECHNIQUE: Multiplanar, multiecho pulse sequences of the brain and surrounding structures were obtained without intravenous contrast. Angiographic images of the Circle of Willis were obtained using MRA technique without intravenous contrast. Angiographic images of the neck were obtained using MRA technique without intravenous contrast. Carotid stenosis measurements (when applicable) are obtained utilizing NASCET criteria, using the distal internal carotid diameter as the denominator. COMPARISON:  Head CT same day.  MRI 09/03/2018. FINDINGS: MRI HEAD FINDINGS Brain: Diffusion imaging confirms acute infarction in the left insula and posterior frontal region. The area of involvement measures about 3.5 cm in size. No mass effect or visible hemorrhage. No hydrocephalus. Chronic small-vessel ischemic changes of the deep white matter seen elsewhere. Vascular: Major vessels at the base of the brain show flow. Skull and upper cervical spine: Negative Sinuses/Orbits: Clear/normal Other: None MRA HEAD FINDINGS Both internal carotid arteries are patent through the skull base and siphon regions with atherosclerotic narrowing and irregularity in the siphon regions. Supraclinoid internal carotid arteries are widely patent. The anterior and middle cerebral vessels appear to show flow without visible  large or medium vessel occlusion. Distal branch vessels do show some atherosclerotic irregularity. The left vertebral artery supplies the basilar. No antegrade flow seen in the right vertebral artery. The basilar artery shows atherosclerotic narrowing and irregularity diffusely. Posterior circulation branch vessels are patent, with both posterior cerebral arteries receiving most of there supply from the anterior circulation. There is long segment stenosis of the left P1 segment, but good flow seen distal to that. MRA NECK FINDINGS There is antegrade flow in both common carotid arteries. The study suffers from pronounced motion degradation. Antegrade flow is seen in both cervical internal carotid arteries, but accurate evaluation of the carotid bifurcations is not possible. There is antegrade flow in the left vertebral artery but none seen on the right. IMPRESSION: 3.5 cm region of acute infarction affecting the left insula and posterior frontal region. No evidence of gross hemorrhage. MR angiography of the neck markedly degraded by motion. Both internal carotid arteries show antegrade flow. No antegrade flow in the right vertebral artery. Intracranial MR angiography does not show any large or medium vessel occlusion. Widespread atherosclerotic irregularity of the more distal branch vessels. Electronically Signed   By: Paulina Fusi M.D.   On: 04/05/2019 10:59   US Renal  Result Date: 04/05/2019 CLINICAL DATA:  68 year old male with hypertension EXAM: RENAL / URINARY TRACT ULTRASOUND COMPLETE COMPARISON:  None. FINDINGS: Right Kidney: Length: 11.1 cm x 5.3 cm x 6.1 cm, 189 cc. Hydronephrosis. Flow confirmed in the hilum of the right kidney. Left Kidney: Length: 10.9 cm x 5.6 cm x 5.9 cm, 187 cc. Hydronephrosis. Flow confirmed in the hilum of the left kidney. Anechoic cystic structure on the inferior cortex measures 2.6 cm x 1.7 cm with through transmission and no internal color flow. Additional cystic lesion  measures 1.1 cm x 1.1 cm x 1.0 cm, with through transmission and no internal flow. Bladder: Catheter in place within the bladder. IMPRESSION: Bilateral hydronephrosis. If there is concern for distal obstruction, CT may be indicated. Left-sided renal cysts. Electronically Signed   By: Gilmer Mor D.O.   On: 04/05/2019 15:39   Dg Chest [facility]  Result Date: 04/05/2019 CLINICAL DATA:  Altered behavior. EXAM: PORTABLE CHEST 1 VIEW COMPARISON:  None. FINDINGS: Lungs are adequately inflated and otherwise clear. Borderline cardiomegaly. Mediastinum is within normal. Fusion hardware over the lower cervical spine intact. Minimal degenerative change of the spine. IMPRESSION: No acute findings. Electronically Signed   By: Elberta Fortisaniel  Boyle M.D.   On: 04/05/2019 11:32   Dg Swallowing Func-speech Pathology  Result Date: 04/07/2019 Objective Swallowing Evaluation: Type of Study: MBS-Modified Barium Swallow Study  Patient Details Name: Gregory [name] MRN: [id number] Date of Birth: 01/03/1951 Today's Date: 04/07/2019 Time: SLP Start Time (ACUTE ONLY): 1140 -SLP Stop Time (ACUTE ONLY): 1200 SLP Time Calculation (min) (ACUTE ONLY): 20 min Past Medical History: Past Medical History: Diagnosis Date . AKI (acute kidney injury) (HCC)  . CKD (chronic kidney disease) stage 2, GFR 60-89 ml/min  . CVA (cerebral vascular accident) (HCC)  . Hyperlipidemia  . Hypertension  . PVD (peripheral vascular disease) (HCC)  Past Surgical History: Past Surgical History: Procedure Laterality Date . AORTIC ARCH ANGIOGRAPHY N/A 09/09/2018  Procedure: AORTIC ARCH ANGIOGRAPHY;  Surgeon: Sherren KernsFields, Charles E, MD;  Location: [facility] INVASIVE CV LAB;  Service: Cardiovascular;  Laterality: N/A; . CAROTID ANGIOGRAPHY Bilateral 09/09/2018  Procedure: CAROTID ANGIOGRAPHY;  Surgeon: Sherren KernsFields, Charles E, MD;  Location: [facility] INVASIVE CV LAB;  Service: Cardiovascular;  Laterality: Bilateral; HPI: Pt is a 68 y.o. male with medical history significant of chronic kidney disease  stage III, hypertension, hyperlipidemia, who presented to the hospital with chief complaint of sudden onset right-sided weakness and inability to talk.  MRI of the brain revealed 3.5 cm region of acute infarction affecting the left insula and posterior frontal region.  No data recorded Assessment / Plan / Recommendation CHL IP CLINICAL IMPRESSIONS 04/07/2019 Clinical Impression Pt presents with a severe oral dysphagia with CN VII and XII weakness impacting labial seal and lingual manipulation of the bolus. There is severe anterior spillage and pooling the right buccal cavity. Pt needs increased time and posterior head tilt to gather and orally transit boluses via slight pumping and passive spillage.  Palatal elevation is weak, does not occur until swallow triggered. There is a plate on the cervical spine starting at C3 causing protrusion of the posterior pharyngeal wall and deflecting delayed nectar liquids into the vestibule. Due to pts body habitus it is very difficult to see the glottis and subglottis. Penetration is visible, strongly suspect airway protection, but cannot confirm. No cough was elicited, but pt could attempt a throat clear. Best textures are honey thick liquids via spoon and puree. Oral suction, second swallows and intermittent throat clearing will be beneficial to clear oral and base of tongue residue post swallow. Hope for increasing automaticity and strength with diet. Meds will need to be crushed.   SLP Visit Diagnosis Dysphagia, oropharyngeal phase (R13.12) Attention and concentration deficit following -- Frontal lobe and executive function deficit following -- Impact on safety and function Severe aspiration risk   CHL IP TREATMENT RECOMMENDATION 04/07/2019 Treatment Recommendations Therapy as outlined in treatment plan below   No flowsheet data found. CHL IP DIET RECOMMENDATION 04/07/2019 SLP Diet Recommendations Dysphagia 1 (Puree) solids Liquid Administration via Spoon Medication  Administration Crushed with puree Compensations Slow rate;Small sips/bites;Monitor for anterior loss;Lingual sweep for clearance of pocketing;Multiple dry swallows after each bite/sip;Clear throat intermittently Postural Changes Seated upright at 90 degrees   CHL IP OTHER RECOMMENDATIONS 04/07/2019 Recommended Consults -- Oral Care Recommendations -- Other Recommendations Have oral suction available   CHL IP FOLLOW  UP RECOMMENDATIONS 04/07/2019 Follow up Recommendations Inpatient Rehab   CHL IP FREQUENCY AND DURATION 04/07/2019 Speech Therapy Frequency (ACUTE ONLY) min 2x/week Treatment Duration 2 weeks      CHL IP ORAL PHASE 04/07/2019 Oral Phase Impaired Oral - Pudding Teaspoon -- Oral - Pudding Cup -- Oral - Honey Teaspoon Decreased bolus cohesion;Premature spillage;Delayed oral transit;Lingual/palatal residue;Right pocketing in lateral sulci;Reduced posterior propulsion;Lingual pumping;Weak lingual manipulation;Right anterior bolus loss Oral - Honey Cup -- Oral - Nectar Teaspoon Decreased bolus cohesion;Premature spillage;Delayed oral transit;Lingual/palatal residue;Right pocketing in lateral sulci;Reduced posterior propulsion;Lingual pumping;Weak lingual manipulation;Right anterior bolus loss Oral - Nectar Cup Decreased bolus cohesion;Premature spillage;Delayed oral transit;Lingual/palatal residue;Right pocketing in lateral sulci;Reduced posterior propulsion;Lingual pumping;Weak lingual manipulation;Right anterior bolus loss Oral - Nectar Straw -- Oral - Thin Teaspoon -- Oral - Thin Cup -- Oral - Thin Straw -- Oral - Puree Decreased bolus cohesion;Premature spillage;Delayed oral transit;Lingual/palatal residue;Right pocketing in lateral sulci;Reduced posterior propulsion;Lingual pumping;Weak lingual manipulation;Right anterior bolus loss Oral - Mech Soft -- Oral - Regular -- Oral - Multi-Consistency -- Oral - Pill -- Oral Phase - Comment --  CHL IP PHARYNGEAL PHASE 04/07/2019 Pharyngeal Phase Impaired Pharyngeal-  Pudding Teaspoon -- Pharyngeal -- Pharyngeal- Pudding Cup -- Pharyngeal -- Pharyngeal- Honey Teaspoon Delayed swallow initiation-vallecula;Pharyngeal residue - valleculae Pharyngeal -- Pharyngeal- Honey Cup -- Pharyngeal -- Pharyngeal- Nectar Teaspoon Delayed swallow initiation-pyriform sinuses;Penetration/Aspiration before swallow;Penetration/Aspiration during swallow;Penetration/Apiration after swallow;Pharyngeal residue - valleculae Pharyngeal Material enters airway, remains ABOVE vocal cords and not ejected out;Material enters airway, remains ABOVE vocal cords then ejected out Pharyngeal- Nectar Cup Delayed swallow initiation-pyriform sinuses;Penetration/Aspiration before swallow;Penetration/Aspiration during swallow;Penetration/Apiration after swallow;Pharyngeal residue - valleculae Pharyngeal Material enters airway, remains ABOVE vocal cords and not ejected out Pharyngeal- Nectar Straw -- Pharyngeal -- Pharyngeal- Thin Teaspoon -- Pharyngeal -- Pharyngeal- Thin Cup -- Pharyngeal -- Pharyngeal- Thin Straw -- Pharyngeal -- Pharyngeal- Puree Pharyngeal residue - valleculae;Delayed swallow initiation-vallecula Pharyngeal Material does not enter airway Pharyngeal- Mechanical Soft -- Pharyngeal -- Pharyngeal- Regular -- Pharyngeal -- Pharyngeal- Multi-consistency -- Pharyngeal -- Pharyngeal- Pill -- Pharyngeal -- Pharyngeal Comment --  No flowsheet data found. DeBlois, Riley Nearing 04/07/2019, 1:22 PM              Ct Renal Stone Study  Result Date: 04/06/2019 CLINICAL DATA:  Hydronephrosis. EXAM: CT ABDOMEN AND PELVIS WITHOUT CONTRAST TECHNIQUE: Multidetector CT imaging of the abdomen and pelvis was performed following the standard protocol without IV contrast. COMPARISON:  Ultrasound of April 05, 2019. FINDINGS: Lower chest: No acute abnormality. Hepatobiliary: No focal liver abnormality is seen. No gallstones, gallbladder wall thickening, or biliary dilatation. Pancreas: Unremarkable. No pancreatic ductal  dilatation or surrounding inflammatory changes. Spleen: Normal in size without focal abnormality. Adrenals/Urinary Tract: Adrenal glands appear normal. Moderate bilateral hydroureteronephrosis is noted without evidence of renal or ureteral calculi. Catheter is noted in urinary bladder which appears to be decompressed. However, there appears to be significant diffuse wall thickening of urinary bladder which may be due to lack of distension, but is concerning for cystitis. Clinical correlation is recommended. Stomach/Bowel: Stomach is within normal limits. Appendix appears normal. No evidence of bowel wall thickening, distention, or inflammatory changes. Vascular/Lymphatic: Aortic atherosclerosis. No enlarged abdominal or pelvic lymph nodes. Reproductive: Prostate is unremarkable. Other: No abdominal wall hernia or abnormality. No abdominopelvic ascites. Musculoskeletal: No acute or significant osseous findings. IMPRESSION: Moderate bilateral hydroureteronephrosis is noted without evidence of obstructing calculus. Diffuse wall thickening of urinary bladder is noted which may be due to lack of distension, but is concerning for  cystitis. Clinical correlation is recommended. Catheter is noted within urinary bladder. Aortic Atherosclerosis (ICD10-I70.0). Electronically Signed   By: Lupita Raider M.D.   On: 04/06/2019 09:24   Ct Head Code Stroke Wo Contrast  Result Date: 04/05/2019 CLINICAL DATA:  Code stroke. Right-sided weakness. Speech disturbance. Last seen normal 0730 hours. EXAM: CT HEAD WITHOUT CONTRAST TECHNIQUE: Contiguous axial images were obtained from the base of the skull through the vertex without intravenous contrast. COMPARISON:  09/03/2018 FINDINGS: Brain: No CT evidence of acute infarction. Chronic small-vessel ischemic changes of the cerebral hemispheric white matter. No mass lesion, hemorrhage, hydrocephalus or extra-axial collection. Vascular: No definite hyperdense vessel. One could question  slight hyperdensity in the left M1 segment. Skull: Negative Sinuses/Orbits: Clear/normal Other: None ASPECTS (Alberta Stroke Program Early CT Score) - Ganglionic level infarction (caudate, lentiform nuclei, internal capsule, insula, M1-M3 cortex): 6 - Supraganglionic infarction (M4-M6 cortex): 3 Total score (0-10 with 10 being normal): 9 IMPRESSION: 1. Possible early gray-white differentiation loss in the insula on the left. Chronic small-vessel ischemic changes. 2. One could question slight hyperdensity in the left M1 segment. This is not definite. 3. ASPECTS is 9 4. These results were communicated to Dr. Amada Jupiter at 10:01 amon 4/15/2020by text page via the [facility] messaging system. Electronically Signed   By: Paulina Fusi M.D.   On: 04/05/2019 10:04   Vas US Carotid  Result Date: 04/06/2019 Carotid Arterial Duplex Study Indications:  CVA. Risk Factors: Hypertension, hyperlipidemia, prior CVA. Limitations:  Patient positioning, snoring, body habitus Performing Technologist: Blanch Media RVS  Examination Guidelines: A complete evaluation includes B-mode imaging, spectral Doppler, color Doppler, and power Doppler as needed of all accessible portions of each vessel. Bilateral testing is considered an integral part of a complete examination. Limited examinations for reoccurring indications may be performed as noted.  Right Carotid Findings: +----------+--------+--------+--------+-----------+--------+           PSV cm/sEDV cm/sStenosisDescribe   Comments +----------+--------+--------+--------+-----------+--------+ CCA Prox  80      16              homogeneous         +----------+--------+--------+--------+-----------+--------+ CCA Distal164     24              homogeneous         +----------+--------+--------+--------+-----------+--------+ ICA Prox  188     34      1-39%   homogeneous         +----------+--------+--------+--------+-----------+--------+ ICA Distal112     22                                   +----------+--------+--------+--------+-----------+--------+ ECA       170     11                                  +----------+--------+--------+--------+-----------+--------+ +----------+--------+-------+--------+-------------------+           PSV cm/sEDV cmsDescribeArm Pressure (mmHG) +----------+--------+-------+--------+-------------------+ [id number]                                         +----------+--------+-------+--------+-------------------+ +---------+--------+--------+--------------+ VertebralPSV cm/sEDV cm/sNot identified +---------+--------+--------+--------------+  Left Carotid Findings: +----------+--------+--------+--------+-----------+--------+           PSV cm/sEDV cm/sStenosisDescribe   Comments +----------+--------+--------+--------+-----------+--------+  CCA Prox  181     26              homogeneous         +----------+--------+--------+--------+-----------+--------+ CCA Distal118     23              homogeneous         +----------+--------+--------+--------+-----------+--------+ ICA Prox  161     28      1-39%   homogeneous         +----------+--------+--------+--------+-----------+--------+ ICA Distal152     30                                  +----------+--------+--------+--------+-----------+--------+ ECA       170     12                                  +----------+--------+--------+--------+-----------+--------+ +----------+--------+--------+--------+-------------------+ SubclavianPSV cm/sEDV cm/sDescribeArm Pressure (mmHG) +----------+--------+--------+--------+-------------------+           121                                         +----------+--------+--------+--------+-------------------+ +---------+--------+--+--------+--+---------+ VertebralPSV cm/s52EDV cm/s15Antegrade +---------+--------+--+--------+--+---------+    Preliminary      Subjective: Eager to return to  CIR  Discharge Exam: Vitals:   04/17/19 2116 04/18/19 0520  BP: (!) 108/47 (!) 158/96  Pulse: (!) 58 94  Resp:    Temp: (!) 97.5 F (36.4 C) 98.1 F (36.7 C)  SpO2: 97% 91%   Vitals:   04/17/19 1333 04/17/19 2024 04/17/19 2116 04/18/19 0520  BP: (!) 127/104  (!) 108/47 (!) 158/96  Pulse: 94 88 (!) 58 94  Resp: 18 18    Temp: 98.5 F (36.9 C)  (!) 97.5 F (36.4 C) 98.1 F (36.7 C)  TempSrc: Oral  Oral Oral  SpO2: 97% 95% 97% 91%  Weight:    83.7 kg    General: Pt is alert, awake, not in acute distress Cardiovascular: RRR, S1/S2 +, no rubs, no gallops Respiratory: CTA bilaterally, no wheezing, no rhonchi Abdominal: Soft, NT, ND, bowel sounds + Extremities: no edema, no cyanosis   The results of significant diagnostics from this hospitalization (including imaging, microbiology, ancillary and laboratory) are listed below for reference.     Microbiology: No results found for this or any previous visit (from the past 240 hour(s)).   Labs: BNP (last 3 results) No results for input(s): BNP in the last 8760 hours. Basic Metabolic Panel: Recent Labs  Lab 04/13/19 0517  04/14/19 0725  04/15/19 1344  04/16/19 0341 04/16/19 0901 04/16/19 1200 04/17/19 0449 04/17/19 1300 04/18/19 0251  NA 144  --  143   < > 140  --  143  --  142 143  --  140  K 6.0*   < > 5.7*   < > 6.0*   < > 6.0* 6.0* 5.6* 5.5* 6.0* 5.8*  CL 106  --  103   < > 100  --  104  --  105 104  --  104  CO2 26  --  26   < > 25  --  27  --  23 26  --  24  GLUCOSE 95  --  94   < >  118*  --  104*  --  108* 95  --  96  BUN 86*  --  80*   < > 85*  --  83*  --  84* 80*  --  75*  CREATININE 4.34*  --  3.94*   < > 4.33*  --  4.34*  --  4.24* 4.08*  --  4.01*  CALCIUM 8.5*  --  9.3   < > 9.6  --  9.2  --  9.4 8.8*  --  9.0  MG  --   --   --   --   --   --  1.8  --   --   --   --   --   PHOS 5.4*  --  5.4*  --   --   --   --   --   --   --   --   --    < > = values in this interval not displayed.   Liver Function  Tests: Recent Labs  Lab 04/13/19 0517 04/14/19 0725 04/16/19 0341 04/17/19 0449 04/18/19 0251  AST  --   --  90* 82* 74*  ALT  --   --  112* 104* 94*  ALKPHOS  --   --  131* 119 130*  BILITOT  --   --  0.5 0.5 0.5  PROT  --   --  7.3 6.8 7.0  ALBUMIN 2.6* 2.8* 2.7* 2.5* 2.6*   No results for input(s): LIPASE, AMYLASE in the last 168 hours. No results for input(s): AMMONIA in the last 168 hours. CBC: Recent Labs  Lab 04/13/19 0517 04/15/19 0549 04/16/19 0341 04/17/19 0449 04/18/19 0251  WBC 9.2 7.9 9.5 8.1 9.1  NEUTROABS  --  4.5  --   --   --   HGB 9.4* 10.3* 10.0* 9.1* 9.1*  HCT 32.3* 33.7* 33.2* 30.8* 30.5*  MCV 96.4 94.9 95.4 95.1 94.4  PLT 182 190 220 219 230   Cardiac Enzymes: No results for input(s): CKTOTAL, CKMB, CKMBINDEX, TROPONINI in the last 168 hours. BNP: Invalid input(s): POCBNP CBG: Recent Labs  Lab 04/15/19 2034 04/15/19 2324 04/16/19 0527  GLUCAP 112* 80 84   D-Dimer No results for input(s): DDIMER in the last 72 hours. Hgb A1c No results for input(s): HGBA1C in the last 72 hours. Lipid Profile No results for input(s): CHOL, HDL, LDLCALC, TRIG, CHOLHDL, LDLDIRECT in the last 72 hours. Thyroid function studies No results for input(s): TSH, T4TOTAL, T3FREE, THYROIDAB in the last 72 hours.  Invalid input(s): FREET3 Anemia work up No results for input(s): VITAMINB12, FOLATE, FERRITIN, TIBC, IRON, RETICCTPCT in the last 72 hours. Urinalysis    Component Value Date/Time   COLORURINE YELLOW 04/05/2019 1830   APPEARANCEUR CLEAR 04/05/2019 1830   LABSPEC 1.011 04/05/2019 1830   PHURINE 5.0 04/05/2019 1830   GLUCOSEU NEGATIVE 04/05/2019 1830   HGBUR MODERATE (A) 04/05/2019 1830   BILIRUBINUR NEGATIVE 04/05/2019 1830   KETONESUR NEGATIVE 04/05/2019 1830   PROTEINUR NEGATIVE 04/05/2019 1830   UROBILINOGEN 0.2 04/23/2010 1049   NITRITE NEGATIVE 04/05/2019 1830   LEUKOCYTESUR SMALL (A) 04/05/2019 1830   Sepsis Labs Invalid input(s):  PROCALCITONIN,  WBC,  LACTICIDVEN Microbiology No results found for this or any previous visit (from the past 240 hour(s)).  Time spent: 30 min  SIGNED:   Rickey Barbara, MD  [facility] 04/18/2019, 11:59 AM  If 7PM-7AM, please contact night-coverage

## 2019-04-18 NOTE — Progress Notes (Signed)
Inpatient Rehabilitation-Admissions Coordinator   [facility] has insurance approval and received medical clearance per Dr. Rhona Leavens for admit to CIR today. Pt in agreement for return to CIR. AC will update RN, CM/SW regarding plan.   Please call if questions.   Nanine Means, OTR/L  Rehab Admissions Coordinator  [phone] 04/18/2019 11:57 AM

## 2019-04-18 NOTE — TOC Transition Note (Signed)
Transition of Care [facility]) - CM/SW Discharge Note   Patient Details  Name: Gregory [name] MRN: [id number] Date of Birth: 1951-08-11  Transition of Care [facility]) CM/SW Contact:  Colleen Can RN, BSN, NCM-BC, ACM-RN [phone] (working remotely) Phone Number: 04/18/2019, 1:37 PM   Clinical Narrative:    CM following for dispositional needs. Patient is transitioning to CIR today for rehab needs. No further needs from CM.  Final next level of care: IP Rehab Facility Barriers to Discharge: No Barriers Identified   Discharge Plan and Services In-house Referral: NA Discharge Planning Services: NA            DME Arranged: N/A DME Agency: NA       HH Arranged: NA HH Agency: NA

## 2019-04-18 NOTE — Care Management (Signed)
CM spoke to [place] OT, Rehab [facility], with a request from Tammi Sou Medicare Rep, to fax the patients DC summary to [phone]. Authorization#: [id number]. CM faxed the DC summary as requested.   Colleen Can RN, BSN, NCM-BC, ACM-RN [phone]

## 2019-04-18 NOTE — Progress Notes (Signed)
Patient ID: Gregory [name], male   DOB: February 10, 1951, 68 y.o.   MRN: [id number] [facility] Progress Note   Assessment/ Plan:   #  Acute kidney injury on chronic kidney disease stage III:  With bladder outlet obstruction/obstructive uropathy.  It is highly likely that he has had this obstruction for >2 weeks and may have significant residual reduced GFR - leave indwelling Foley until seen by urology as outpatient - Nonoliguric; plateau SCr of around 4 - Continue IV fluids - reduce D5 1/2NS to 41mL/h - CT abdomen/pelvis 4/26 w/ decompression of bladder, foley in place, no HN  # Hyperkalemia   - K stable in mid 5s  - rec Veltassa yesterday, K still up, inc Veltassa to 16.8gm today - Cont dietary K restriction  # Hypernatremia: resolved  # Hypertension: acceptable   # Anemia: stable; CTM   # Elevated troponin levels: Trending down last eval  # Acute CVA with right-sided weakness/expressive aphasia: s/p speech eval and for rehab   Subjective:    K 5.8 this AM. Stable SCr.  Adequate UOP; remains on maint IVFs  No interval changes  Tol PO   Objective:   BP (!) 158/96 (BP Location: Left Arm)   Pulse 94   Temp 98.1 F (36.7 C) (Oral)   Resp 18   Wt 83.7 kg   SpO2 91%   BMI 31.67 kg/m   Intake/Output Summary (Last 24 hours) at 04/18/2019 1116 Last data filed at 04/18/2019 0431 Gross per 24 hour  Intake 1148.62 ml  Output 1155 ml  Net -6.38 ml   Weight change: -0.6 kg  Physical Exam:  Gen: elderly male in bed in NAD, smiling  CVS: RRR; S1, S2 Resp: clear to auscultation bilaterally and normal work of breathing  Abd: obese habitus; softly distended; nontender.   Ext: no lower extremity edema    Neuro: Aphasia; awake and interactive.  Nods and shakes head but mostly nonverbal  GU foley intact and draining  Imaging: No results found.  Labs: BMET Recent Labs  Lab 04/13/19 0517  04/14/19 0725  04/14/19 2207 04/15/19 0549 04/15/19 1344  04/15/19 2109 04/16/19 0341 04/16/19 0901 04/16/19 1200 04/17/19 0449 04/17/19 1300 04/18/19 0251  NA 144  --  143   < > 140 141 140  --  143  --  142 143  --  140  K 6.0*   < > 5.7*   < > 6.3* 6.0* 6.0* 5.9* 6.0* 6.0* 5.6* 5.5* 6.0* 5.8*  CL 106  --  103   < > 103 103 100  --  104  --  105 104  --  104  CO2 26  --  26   < > 24 23 25   --  27  --  23 26  --  24  GLUCOSE 95  --  94   < > 168* 94 118*  --  104*  --  108* 95  --  96  BUN 86*  --  80*   < > 82* 82* 85*  --  83*  --  84* 80*  --  75*  CREATININE 4.34*  --  3.94*   < > 4.03* 4.02* 4.33*  --  4.34*  --  4.24* 4.08*  --  4.01*  CALCIUM 8.5*  --  9.3   < > 9.0 9.5 9.6  --  9.2  --  9.4 8.8*  --  9.0  PHOS 5.4*  --  5.4*  --   --   --   --   --   --   --   --   --   --   --    < > =  values in this interval not displayed.   CBC Recent Labs  Lab 04/15/19 0549 04/16/19 0341 04/17/19 0449 04/18/19 0251  WBC 7.9 9.5 8.1 9.1  NEUTROABS 4.5  --   --   --   HGB 10.3* 10.0* 9.1* 9.1*  HCT 33.7* 33.2* 30.8* 30.5*  MCV 94.9 95.4 95.1 94.4  PLT 190 220 219 230    Medications:    . aspirin  300 mg Rectal Daily   Or  . aspirin  325 mg Oral Daily  . atorvastatin  80 mg Oral q1800  . clopidogrel  75 mg Oral Daily  . dextrose  1 ampule Intravenous Once  . heparin  5,000 Units Subcutaneous Q8H  . insulin aspart  5 Units Intravenous Once  . mouth rinse  15 mL Mouth Rinse BID  . metoprolol tartrate  25 mg Oral BID  . patiromer  16.8 g Oral Daily  . sodium chloride flush  3 mL Intravenous Q12H  . sodium chloride flush  3 mL Intravenous Q12H   Arita MissRyan B Abdulloh Ullom 04/18/2019

## 2019-04-19 ENCOUNTER — Inpatient Hospital Stay (HOSPITAL_COMMUNITY): Payer: Medicare HMO | Admitting: Occupational Therapy

## 2019-04-19 ENCOUNTER — Inpatient Hospital Stay (HOSPITAL_COMMUNITY): Payer: Medicare HMO | Admitting: Physical Therapy

## 2019-04-19 ENCOUNTER — Inpatient Hospital Stay (HOSPITAL_COMMUNITY): Payer: Medicare HMO

## 2019-04-19 ENCOUNTER — Inpatient Hospital Stay (HOSPITAL_COMMUNITY): Payer: Medicare HMO | Admitting: Speech Pathology

## 2019-04-19 DIAGNOSIS — G8191 Hemiplegia, unspecified affecting right dominant side: Secondary | ICD-10-CM

## 2019-04-19 DIAGNOSIS — I63512 Cerebral infarction due to unspecified occlusion or stenosis of left middle cerebral artery: Secondary | ICD-10-CM

## 2019-04-19 DIAGNOSIS — I69320 Aphasia following cerebral infarction: Secondary | ICD-10-CM

## 2019-04-19 LAB — COMPREHENSIVE METABOLIC PANEL
ALT: 82 U/L — ABNORMAL HIGH (ref 0–44)
AST: 62 U/L — ABNORMAL HIGH (ref 15–41)
Albumin: 2.7 g/dL — ABNORMAL LOW (ref 3.5–5.0)
Alkaline Phosphatase: 141 U/L — ABNORMAL HIGH (ref 38–126)
Anion gap: 9 (ref 5–15)
BUN: 68 mg/dL — ABNORMAL HIGH (ref 8–23)
CO2: 22 mmol/L (ref 22–32)
Calcium: 9.3 mg/dL (ref 8.9–10.3)
Chloride: 107 mmol/L (ref 98–111)
Creatinine, Ser: 3.78 mg/dL — ABNORMAL HIGH (ref 0.61–1.24)
GFR calc Af Amer: 18 mL/min — ABNORMAL LOW (ref >60)
GFR calc non Af Amer: 15 mL/min — ABNORMAL LOW (ref >60)
Glucose, Bld: 93 mg/dL (ref 70–99)
Potassium: 6 mmol/L — ABNORMAL HIGH (ref 3.5–5.1)
Sodium: 138 mmol/L (ref 135–145)
Total Bilirubin: 0.5 mg/dL (ref 0.3–1.2)
Total Protein: 6.9 g/dL (ref 6.5–8.1)

## 2019-04-19 LAB — BASIC METABOLIC PANEL
Anion gap: 10 (ref 5–15)
BUN: 71 mg/dL — ABNORMAL HIGH (ref 8–23)
CO2: 22 mmol/L (ref 22–32)
Calcium: 9.4 mg/dL (ref 8.9–10.3)
Chloride: 105 mmol/L (ref 98–111)
Creatinine, Ser: 3.73 mg/dL — ABNORMAL HIGH (ref 0.61–1.24)
GFR calc Af Amer: 18 mL/min — ABNORMAL LOW (ref >60)
GFR calc non Af Amer: 16 mL/min — ABNORMAL LOW (ref >60)
Glucose, Bld: 99 mg/dL (ref 70–99)
Potassium: 6.6 mmol/L (ref 3.5–5.1)
Sodium: 137 mmol/L (ref 135–145)

## 2019-04-19 LAB — CBC WITH DIFFERENTIAL/PLATELET
Abs Immature Granulocytes: 0.06 10*3/uL (ref 0.00–0.07)
Basophils Absolute: 0 10*3/uL (ref 0.0–0.1)
Basophils Relative: 0 %
Eosinophils Absolute: 0.2 10*3/uL (ref 0.0–0.5)
Eosinophils Relative: 2 %
HCT: 31.6 % — ABNORMAL LOW (ref 39.0–52.0)
Hemoglobin: 9.5 g/dL — ABNORMAL LOW (ref 13.0–17.0)
Immature Granulocytes: 1 %
Lymphocytes Relative: 19 %
Lymphs Abs: 1.9 10*3/uL (ref 0.7–4.0)
MCH: 28 pg (ref 26.0–34.0)
MCHC: 30.1 g/dL (ref 30.0–36.0)
MCV: 93.2 fL (ref 80.0–100.0)
Monocytes Absolute: 1 10*3/uL (ref 0.1–1.0)
Monocytes Relative: 10 %
Neutro Abs: 6.9 10*3/uL (ref 1.7–7.7)
Neutrophils Relative %: 68 %
Platelets: 233 10*3/uL (ref 150–400)
RBC: 3.39 MIL/uL — ABNORMAL LOW (ref 4.22–5.81)
RDW: 13.1 % (ref 11.5–15.5)
WBC: 10.1 10*3/uL (ref 4.0–10.5)
nRBC: 0 % (ref 0.0–0.2)

## 2019-04-19 MED ORDER — PATIROMER SORBITEX CALCIUM 8.4 G PO PACK
25.2000 g | PACK | Freq: Every day | ORAL | Status: DC
  Administered 2019-04-19 – 2019-04-21 (×3): 25.2 g via ORAL
  Filled 2019-04-19 (×6): qty 3

## 2019-04-19 MED ORDER — SODIUM BICARBONATE 8.4 % IV SOLN
INTRAVENOUS | Status: DC
  Administered 2019-04-19: 20:00:00 via INTRAVENOUS
  Filled 2019-04-19 (×4): qty 150

## 2019-04-19 MED ORDER — SODIUM BICARBONATE 650 MG PO TABS: 1300.0000 mg | ORAL_TABLET | Freq: Two times a day (BID) | ORAL | Status: DC

## 2019-04-19 MED ORDER — CALCIUM GLUCONATE-NACL 1-0.675 GM/50ML-% IV SOLN
1.0000 g | Freq: Once | INTRAVENOUS | Status: AC
  Administered 2019-04-19: 1000 mg via INTRAVENOUS
  Filled 2019-04-19: qty 50

## 2019-04-19 MED ORDER — METOLAZONE 5 MG PO TABS
5.0000 mg | ORAL_TABLET | Freq: Every day | ORAL | Status: DC
  Administered 2019-04-19 – 2019-04-21 (×3): 5 mg via ORAL
  Filled 2019-04-19 (×3): qty 1

## 2019-04-19 MED ORDER — FUROSEMIDE 10 MG/ML IJ SOLN
160.0000 mg | Freq: Once | INTRAVENOUS | Status: AC
  Administered 2019-04-19: 160 mg via INTRAVENOUS
  Filled 2019-04-19: qty 10

## 2019-04-19 NOTE — Progress Notes (Signed)
Inpatient Rehabilitation  Patient information reviewed and entered into eRehab system by Kai Calico M. Modupe Shampine, M.A., CCC/SLP, PPS Coordinator.  Information including medical coding, functional ability and quality indicators will be reviewed and updated through discharge.    

## 2019-04-19 NOTE — Progress Notes (Signed)
[place] PHYSICAL MEDICINE & REHABILITATION PROGRESS NOTE   Subjective/Complaints:  No issues overnite.  Pt remembers me.  More alert , Y/N response improved   ROS- neg CP SOB, N/V/D   Objective:   No results found. Recent Labs    04/18/19 0251 04/19/19 0532  WBC 9.1 10.1  HGB 9.1* 9.5*  HCT 30.5* 31.6*  PLT 230 233   Recent Labs    04/18/19 0251 04/19/19 0532  NA 140 138  K 5.8* 6.0*  CL 104 107  CO2 24 22  GLUCOSE 96 93  BUN 75* 68*  CREATININE 4.01* 3.78*  CALCIUM 9.0 9.3    Intake/Output Summary (Last 24 hours) at 04/19/2019 1013 Last data filed at 04/19/2019 0310 Gross per 24 hour  Intake 0 ml  Output 700 ml  Net -700 ml     Physical Exam: Vital Signs Blood pressure (!) 144/61, pulse 84, temperature 98 F (36.7 C), temperature source Oral, resp. rate 19, height 5\' 4"  (1.626 m), weight 83.2 kg, SpO2 99 %.   General: No acute distress Mood and affect are appropriate Heart: Regular rate and rhythm no rubs murmurs or extra sounds Lungs: Clear to auscultation, breathing unlabored, no rales or wheezes Abdomen: Positive bowel sounds, soft nontender to palpation, nondistended Extremities: No clubbing, cyanosis, or edema Skin: No evidence of breakdown, no evidence of rash Neurologic: Cranial nerves II through XII intact, motor strength is 5/5 in Left and 2-5 Right deltoid, bicep, tricep, grip, hip flexor, knee extensors,  Trace Right ankle dorsiflexor and plantar flexor Sensory exam normal sensation to light touch and proprioception in bilateral upper and lower extremities Cerebellar exam normal finger to nose to finger as well as heel to shin in bilateral upper and lower extremities Musculoskeletal: Full range of motion in all 4 extremities. No joint swelling   Assessment/Plan: 1. 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  Physiatrist is providing close team supervision and 24 hour management of active medical problems listed below.  Physiatrist and rehab team continue to assess barriers to discharge/monitor patient progress toward functional and medical goals  Care Tool:  Bathing              Bathing assist       Upper Body Dressing/Undressing Upper body dressing   What is the patient wearing?: Pull over shirt    Upper body assist Assist Level: Maximal Assistance - Patient 25 - 49%    Lower Body Dressing/Undressing Lower body dressing      What is the patient wearing?: Incontinence brief     Lower body assist Assist for lower body dressing: Total Assistance - Patient < 25%     Toileting Toileting    Toileting assist Assist for toileting: Dependent - Patient 0%(foley)     Transfers Chair/bed transfer  Transfers assist     Chair/bed transfer assist level: Maximal Assistance - Patient 25 - 49%     Locomotion Ambulation   Ambulation assist   Ambulation activity did not occur: Safety/medical concerns          Walk 10 feet activity   Assist  Walk 10 feet activity did not occur: Safety/medical concerns        Walk 50 feet activity   Assist Walk 50 feet with 2 turns activity did not occur: Safety/medical concerns         Walk 150  feet activity   Assist Walk 150 feet activity did not occur: Safety/medical concerns         Walk 10 feet on uneven surface  activity   Assist Walk 10 feet on uneven surfaces activity did not occur: Safety/medical concerns         Wheelchair     Assist Will patient use wheelchair at discharge?: Yes Type of Wheelchair: Manual    Wheelchair assist level: Dependent - Patient 0% Max wheelchair distance: 150 ft    Wheelchair 50 feet with 2 turns activity    Assist        Assist Level: Dependent - Patient 0%   Wheelchair 150 feet activity     Assist     Assist Level: Dependent -  Patient 0%    Medical Problem List and Plan: 1.Right hemiparesis and aphasia/dysphagiasecondary to left insular and posterior frontal infarct embolic secondary to non-STEMI. Status post loop recorder -CIR PT, OT, SLP re evals 2. Antithrombotics: -DVT/anticoagulation:Subcutaneous heparin. -antiplatelet therapy: Aspirin 325 mg daily, Plavix 75 mg daily 3. Pain Management:Tylenol as needed 4. Mood:Provide emotional support -antipsychotic agents: N/A 5. Neuropsych: This patientiscapable of making decisions on hisown behalf. 6. Skin/Wound Care:Routine skin checks 7. Fluids/Electrolytes/Nutrition:Routine in and outs with follow-up chemistries HyperK increase Veltassa per nephrology 8.Dysphagia. Dysphagia #1 honey thick liquids. Monitor hydration/labs. Monitor for aspiration, also needs IVF for hydration  9.Acute on chronic kidney disease stage III/hyperkalemia with obstructive uropathy/BPH. Follow-up urology services as well as nephrology services. Discussed with renal -FOLEY TUBE TO REMAIN IN PLACE UNTIL FOLLOW UP OUT PATIENT WITH UROLOGY 10.Hyperlipidemia. Lipitor 11.Hypertension. Lopressor 25 mg twice daily. Monitor with increased mobility Vitals:   04/18/19 1943 04/19/19 0453  BP: (!) 150/80 (!) 144/61  Pulse: 88 84  Resp: 16 19  Temp: 98 F (36.7 C) 98 F (36.7 C)  SpO2: 98% 99%  Fair control 12.OSA. Continue CPAP    LOS: 1 days A FACE TO FACE EVALUATION WAS PERFORMED  Erick Colace 04/19/2019, 10:13 AM

## 2019-04-19 NOTE — Progress Notes (Signed)
Physical Therapy Session Note  Patient Details  Name: Gregory [name] MRN: [id number] Date of Birth: Dec 23, 1950  Today's Date: 04/19/2019 PT Individual Time: 1100-1130 PT Individual Time Calculation (min): 30 min   Short Term Goals: Week 1:  PT Short Term Goal 1 (Week 1): Pt will perform bed<>chair transfer with mod assist PT Short Term Goal 2 (Week 1): Pt will ambulate 10' w/ max assist w/ LRAD PT Short Term Goal 3 (Week 1): Pt will perform bed mobility with min assist  Skilled Therapeutic Interventions/Progress Updates: Pt presented in w/c agreeable to therapy. Pt denies pain throughout session. Pt transported to rehab gym for time management and participated in STS at parallel bars. Pt was able to perform x 3 STS in bars requiring mod to maxA and max multimodal cues for increasing erect posture and performing anterior translation of hips. Pt returned to room and performed STS in [facility] with transfer to bed. Performed sit to supine maxA and maxA for repositioning in bed. Pt left in bed at end of session with bed alarm on, call bell within reach and needs met.      Therapy Documentation Precautions:  Precautions Precautions: Fall Precaution Comments: Expressive aphasia, R hemiparesis Restrictions Weight Bearing Restrictions: No General:   Vital Signs: Therapy Vitals Temp: (!) 97.5 F (36.4 C) Temp Source: Oral Pulse Rate: 97 Resp: 16 BP: (!) 144/102 Patient Position (if appropriate): Sitting Oxygen Therapy SpO2: 98 % O2 Device: Room Air Pain: Pain Assessment Pain Scale: Faces Faces Pain Scale: No hurt    Therapy/Group: Individual Therapy  Raymund Manrique  Shayden Bobier, PTA   04/19/2019, 4:00 PM

## 2019-04-19 NOTE — Progress Notes (Signed)
Gregory [name], Gregory Livings, LCSW  Social Worker  Physical Medicine and Rehabilitation  Progress Notes  Signed  Date of Service:  04/11/2019 11:54 AM       Related encounter: Admission (Discharged) from 04/10/2019 in [facility] A      Signed        Show:Clear all [x] Manual[x] Template[] Copied  Added by: [x] Kelissa Merlin, Gregory Livings, LCSW  [] Hover for details Social Work  Social Work Assessment and Plan   Patient Details  Name: Gregory [name] MRN: [id number] Date of Birth: 05/23/1951   Today's Date: 04/11/2019   Problem List:      Patient Active Problem List    Diagnosis Date Noted  . 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 Cerebral infarction due to embolism of left middle cerebral artery (HCC)    . Stroke (cerebrum) (HCC) 04/05/2019  . Demand ischemia (HCC) 04/05/2019  . 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 Pre-diabetes 09/05/2018  . CKD (chronic kidney disease) stage 3, GFR 30-59 ml/min (HCC) 09/05/2018  . Bilateral carotid artery stenosis 09/05/2018  . Essential hypertension 09/05/2018  . TIA (transient ischemic attack) 09/04/2018    Past Medical History:      Past Medical History:  Diagnosis Date  . AKI (acute kidney injury) (HCC)    . CKD (chronic kidney disease) stage 2, GFR 60-89 ml/min    . CVA (cerebral vascular accident) (HCC)    . Hyperlipidemia    . Hypertension    . PVD (peripheral vascular disease) (HCC)      Past Surgical History:       Past Surgical History:  Procedure Laterality Date  . AORTIC ARCH ANGIOGRAPHY N/A 09/09/2018    Procedure: AORTIC ARCH ANGIOGRAPHY;  Surgeon: Sherren Kerns, MD;  [facility]: [facility] INVASIVE CV LAB;  Service: Cardiovascular;  Laterality: N/A;  . CAROTID ANGIOGRAPHY Bilateral 09/09/2018    Procedure: CAROTID ANGIOGRAPHY;  Surgeon: Sherren Kerns, MD;  Location: [facility] INVASIVE CV LAB;  Service: Cardiovascular;  Laterality: Bilateral;  . LOOP RECORDER INSERTION N/A 04/07/2019    Procedure: LOOP RECORDER INSERTION;  Surgeon: Marinus Maw, MD;  Location: [facility] INVASIVE CV LAB;  Service: Cardiovascular;  Laterality: N/A;    Social History:  has no history on file for tobacco, alcohol, and drug.   Family / Support Systems Marital Status: Divorced Patient Roles: Other (Comment), Parent(ex-spouse lives with him) Spouse/Significant Other: Gregory [name] [phone]-home Children: Gregory [name]-son [phone] Three other children who are local also Other Supports: Friends  Anticipated Caregiver: Gregory [name] and Dorinda Hill  Ability/Limitations of Caregiver: Gregory [name] reports they will do what is needed. Between all of them Caregiver Availability: 24/7 Family Dynamics: Gregory [name] reports she loves him but didn't want to be committed to him anymore. They have four children who are involved and supportive. 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 Since he and his wife are divorced, will look toward his children for any decisions while here. [name] is the oldest and the one who is in charge now    Abuse/Neglect Abuse/Neglect Assessment Can Be Completed: Yes Physical Abuse: Denies Verbal Abuse: Denies Sexual Abuse: Denies Exploitation of patient/patient's resources: Denies Self-Neglect: Denies   Emotional  Status Pt's affect, behavior and adjustment status: Pt can nod yes/no and wants to do well here. He was independent before this and hopeful to be again. He was in the hospital in 08/2018 after he had a TIA, but recovered from this. Anie reports he has always been hard headed and stubborn and has  done what he wanted. This is partly the reason she divorced him too may baby Mommas Recent Psychosocial Issues: [facility] [place] Psychiatric History: No history deferred depression screening due to pt's inability to speak at this time. He is good with yes/no but will wait and see his progress beofre having neuro-psych see him here. Substance Abuse History: No issues   Patient / Family Perceptions, Expectations & Goals Pt/Family understanding of illness & functional limitations: Pt seems to realize the reason he is in the hospital and Gregory [name] can ecplain his stroke and deficits. Their son-Festus talks with the MD and relays the information back to her. They are hopeful he will do well here and recover from this stroke. Premorbid pt/family roles/activities: Father, friend, veteran, retiree, etc Anticipated changes in roles/activities/participation: resume Pt/family expectations/goals: Pt nods when talks about starting rehab today. Gregory [name] states: " I hope he does well there, he was independent before and only needed some supervision."   [employer]: Other (Comment)([facility] [place] [facility] services) Premorbid Home Care/DME Agencies: Other (Comment)(has multiple pieces of DME-scooter and rw, bsc, etc) Transportation available at discharge: Family and Gregory [name] pt did not drive prior to admission Resource referrals recommended: Neuropsychology   Discharge Planning Living Arrangements: Spouse/significant other Support Systems: Spouse/significant other, Children, Other relatives, Friends/neighbors, Church/faith community Type of Residence: Private residence Insurance Resources: Media planner (specify)([employer] Medicare) Financial Resources: Social Security Financial Screen Referred: No Living Expenses: Own Money Management: Spouse Does the patient have any problems obtaining your medications?: No Home Management: Gregory [name] does the home  management Patient/Family Preliminary Plans: Return home with Gregory [name] who has not committed to providing physical care to him. They do have four children who are local and involved. Will need to see if Gregory [name] is not going to do physical care then will see if a child will be there to provide this. Sw Barriers to Discharge: Decreased caregiver support, Home environment access/layout Sw Barriers to Discharge Comments: Annie-ex-wife may not want to provide physical care did not committ to it on assessment. May need ramp into home Social Work Anticipated Follow Up Needs: HH/OP, Support Group   Clinical Impression Pleasant gentleman who lives with his ex-wife and both still supportive of one another. They have four grown children who are local and involved. Pt will likely need physical assist at discharge will need to see who will be the responsible one to provide this with the dynamics the way they are. Will await therapy evaluations and work on safe discharge plan for pt.   Lucy Chris 04/11/2019, 11:55 AM             Lokelani Lutes, Gregory Livings, LCSW  Social Worker  Physical Medicine and Rehabilitation  Progress Notes  Signed  Date of Service:  04/11/2019 11:54 AM       Related encounter: Admission (Discharged) from 04/10/2019 in [facility] A      Signed        Show:Clear all Manual[x] Template[] Copied  Added by: Yorley Buch, Gregory Livings, LCSW  Hover for details Social Work  Social Work Assessment and Plan   Patient Details  Name: Gregory [name] MRN: [id number] Date of Birth: 20-Aug-1951   Today's Date: 04/11/2019   Problem List:      Patient Active Problem List    Diagnosis Date Noted  . 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 Cerebral infarction due to embolism of left middle cerebral artery (HCC)    . Stroke (cerebrum) (HCC) 04/05/2019  . Demand ischemia (HCC) 04/05/2019  . 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  Pre-diabetes 09/05/2018  . CKD (chronic kidney disease) stage 3, GFR 30-59 ml/min (HCC) 09/05/2018  . Bilateral carotid artery stenosis 09/05/2018  . Essential hypertension 09/05/2018  . TIA (transient ischemic attack) 09/04/2018    Past Medical History:      Past Medical History:  Diagnosis Date  . AKI (acute kidney injury) (HCC)    . CKD (chronic kidney disease) stage 2, GFR 60-89 ml/min    . CVA (cerebral vascular accident) (HCC)    . Hyperlipidemia    . Hypertension    . PVD (peripheral vascular disease) (HCC)      Past Surgical History:       Past Surgical History:  Procedure Laterality Date  . AORTIC ARCH ANGIOGRAPHY N/A 09/09/2018    Procedure: AORTIC ARCH ANGIOGRAPHY;  Surgeon: Sherren Kerns, MD;  [facility];  Service: Cardiovascular;  Laterality: N/A;  . CAROTID ANGIOGRAPHY Bilateral 09/09/2018    Procedure: CAROTID ANGIOGRAPHY;  Surgeon: Sherren Kerns, MD;  [facility]: [facility];  Service: Cardiovascular;  Laterality: Bilateral;  . LOOP RECORDER INSERTION N/A 04/07/2019    Procedure: LOOP RECORDER INSERTION;  Surgeon: Marinus Maw, MD;  Location: [facility] INVASIVE CV LAB;  Service: Cardiovascular;  Laterality: N/A;    Social History:  has no history on file for tobacco, alcohol, and drug.   Family / Support Systems Marital Status: Divorced Patient Roles: Other (Comment), Parent(ex-spouse lives with him) Spouse/Significant Other: Gregory [name] [phone]-home Children: Neko-son [phone] Three other children who are local also Other Supports: Friends  Anticipated Caregiver: Gregory [name] and Dorinda Hill  Ability/Limitations of Caregiver: Gregory [name] reports they will do what is needed. Between all of them Caregiver Availability: 24/7 Family Dynamics: Gregory [name] reports she loves him but didn't want to be committed to him anymore. They have four children who are involved and supportive. Between all of them hopefully they can do what is needed for pt   Social History  Preferred language: [place] Religion: [employer] Cultural Background: No issues Education: [employer] Read: Yes Write: Yes Employment Status: Retired Date Retired/Disabled/Unemployed: Contractor Issues: No issues Guardian/Conservator: None-according to MD pt is not fully capable of making his own decisions while here. Since he and his wife are divorced, will look toward his children for any decisions while here. Rithwik is the oldest and the one who is in charge now    Abuse/Neglect Abuse/Neglect Assessment Can Be Completed: Yes Physical Abuse: Denies Verbal Abuse: Denies Sexual Abuse: Denies Exploitation of patient/patient's resources: Denies Self-Neglect: Denies   Emotional Status Pt's affect, behavior and adjustment status: Pt can nod yes/no and wants to do well here. He was independent before this and hopeful to be again. He was in the hospital in 08/2018 after he had a TIA, but recovered from this. Anie reports he has always been hard headed and stubborn and has done what he wanted. This is partly the reason she divorced him too may baby Mommas Recent Psychosocial Issues: Other health issues-managed by [facility] in [place] Psychiatric History: No  history deferred depression screening due to pt's inability to speak at this time. He is good with yes/no but will wait and see his progress beofre having neuro-psych see him here. Substance Abuse History: No issues   Patient / Family Perceptions, Expectations & Goals Pt/Family understanding of illness & functional limitations: Pt seems to realize the reason he is in the hospital and Gregory [name] can ecplain his stroke and deficits. Their son-Tay talks with the MD and relays the information back to her. They are hopeful he will do well here and recover from this stroke. Premorbid pt/family roles/activities: Father, friend, veteran, retiree, etc Anticipated changes in roles/activities/participation:  resume Pt/family expectations/goals: Pt nods when talks about starting rehab today. Gregory [name] states: " I hope he does well there, he was independent before and only needed some supervision."   [employer]: Other (Comment)([facility] [place] [facility] services) Premorbid Home Care/DME Agencies: Other (Comment)(has multiple pieces of DME-scooter and rw, bsc, etc) Transportation available at discharge: Family and Gregory [name] pt did not drive prior to admission Resource referrals recommended: Neuropsychology   Discharge Planning Living Arrangements: Spouse/significant other Support Systems: Spouse/significant other, Children, Other relatives, Friends/neighbors, Church/faith community Type of Residence: Private residence Insurance Resources: Media planner (specify)(Humana Medicare) Financial Resources: Social Security Financial Screen Referred: No Living Expenses: Own Money Management: Spouse Does the patient have any problems obtaining your medications?: No Home Management: Gregory [name] does the home management Patient/Family Preliminary Plans: Return home with Gregory [name] who has not committed to providing physical care to him. They do have four children who are local and involved. Will need to see if Gregory [name] is not going to do physical care then will see if a child will be there to provide this. Sw Barriers to Discharge: Decreased caregiver support, Home environment access/layout Sw Barriers to Discharge Comments: Annie-ex-wife may not want to provide physical care did not committ to it on assessment. May need ramp into home Social Work Anticipated Follow Up Needs: HH/OP, Support Group   Clinical Impression Pleasant gentleman who lives with his ex-wife and both still supportive of one another. They have four grown children who are local and involved. Pt will likely need physical assist at discharge will need to see who will be the responsible one to provide this with the dynamics  the way they are. Will await therapy evaluations and work on safe discharge plan for pt.   Lucy Chris 04/11/2019, 11:55 AM             Hildegarde Dunaway, Gregory Livings, LCSW  Social Worker  Physical Medicine and Rehabilitation  Progress Notes  Signed  Date of Service:  04/11/2019 11:54 AM       Related encounter: Admission (Discharged) from 04/10/2019 in [facility] A      Signed        Show:Clear all Manual[x] Template[] Copied  Added by: Nailea Whitehorn, Gregory Livings, LCSW  Hover for details Social Work  Social Work Assessment and Plan   Patient Details  Name: Gregory [name] MRN: [id number] Date of Birth: August 18, 1951   Today's Date: 04/11/2019   Problem List:      Patient Active Problem List    Diagnosis Date Noted  . 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 Cerebral infarction due to embolism of left middle cerebral artery (HCC)    . Stroke (cerebrum) (HCC) 04/05/2019  . Demand ischemia (HCC) 04/05/2019  . 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 Pre-diabetes 09/05/2018  . CKD (chronic kidney  disease) stage 3, GFR 30-59 ml/min (HCC) 09/05/2018  . Bilateral carotid artery stenosis 09/05/2018  . Essential hypertension 09/05/2018  . TIA (transient ischemic attack) 09/04/2018    Past Medical History:      Past Medical History:  Diagnosis Date  . AKI (acute kidney injury) (HCC)    . CKD (chronic kidney disease) stage 2, GFR 60-89 ml/min    . CVA (cerebral vascular accident) (HCC)    . Hyperlipidemia    . Hypertension    . PVD (peripheral vascular disease) (HCC)      Past Surgical History:       Past Surgical History:  Procedure Laterality Date  . AORTIC ARCH ANGIOGRAPHY N/A 09/09/2018    Procedure: AORTIC ARCH ANGIOGRAPHY;  Surgeon: Sherren Kerns, MD;  [facility] INVAS[facility];  Service: Cardiovascular;  Laterality: N/A;  . CAROTID ANGIOGRAPHY Bilateral 09/09/2018    Procedure: CAROTID ANGIOGRAPHY;  Surgeon:  Sherren Kerns, MD;  Location: [facility];  Service: Cardiovascular;  Laterality: Bilateral;  . LOOP RECORDER INSERTION N/A 04/07/2019    Procedure: LOOP RECORDER INSERTION;  Surgeon: Marinus Maw, MD;  Location: [facility] INVASIVE CV LAB;  Service: Cardiovascular;  Laterality: N/A;    Social History:  has no history on file for tobacco, alcohol, and drug.   Family / Support Systems Marital Status: Divorced Patient Roles: Other (Comment), Parent(ex-spouse lives with him) Spouse/Significant Other: Gregory [name] [phone] Children: Yang-son [phone] Three other children who are local also Other Supports: Friends  Anticipated Caregiver: Gregory [name] and Dorinda Hill  Ability/Limitations of Caregiver: Gregory [name] reports they will do what is needed. Between all of them Caregiver Availability: 24/7 Family Dynamics: Gregory [name] reports she loves him but didn't want to be committed to him anymore. They have four children who are involved and supportive. 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 Since he and his wife are divorced, will look toward his children for any decisions while here. Taray is the oldest and the one who is in charge now    Abuse/Neglect Abuse/Neglect Assessment Can Be Completed: Yes Physical Abuse: Denies Verbal Abuse: Denies Sexual Abuse: Denies Exploitation of patient/patient's resources: Denies Self-Neglect: Denies   Emotional Status Pt's affect, behavior and adjustment status: Pt can nod yes/no and wants to do well here. He was independent before this and hopeful to be  again. He was in the hospital in 08/2018 after he had a TIA, but recovered from this. Anie reports he has always been hard headed and stubborn and has done what he wanted. This is partly the reason she divorced him too may baby Mommas Recent Psychosocial Issues: [facility] [place] Psychiatric History: No history deferred depression screening due to pt's inability to speak at this time. He is good with yes/no but will wait and see his progress beofre having neuro-psych see him here. Substance Abuse History: No issues   Patient / Family Perceptions, Expectations & Goals Pt/Family understanding of illness & functional limitations: Pt seems to realize the reason he is in the hospital and Gregory [name] can ecplain his stroke and deficits. Their son-Jarret talks with the MD and relays the information back to her.  They are hopeful he will do well here and recover from this stroke. Premorbid pt/family roles/activities: Father, friend, veteran, retiree, etc Anticipated changes in roles/activities/participation: resume Pt/family expectations/goals: Pt nods when talks about starting rehab today. Gregory [name] states: " I hope he does well there, he was independent before and only needed some supervision."   [employer]: Other (Comment)([facility] [place] [facility]) Premorbid Home Care/DME Agencies: Other (Comment)(has multiple pieces of DME-scooter and rw, bsc, etc) Transportation available at discharge: Family and Gregory [name] pt did not drive prior to admission Resource referrals recommended: Neuropsychology   Discharge Planning Living Arrangements: Spouse/significant other Support Systems: Spouse/significant other, Children, Other relatives, Friends/neighbors, Church/faith community Type of Residence: Private residence Insurance Resources: Media planner (specify)(Humana Medicare) Financial Resources: Social Security Financial Screen Referred: No  Living Expenses: Own Money Management: Spouse Does the patient have any problems obtaining your medications?: No Home Management: Gregory [name] does the home management Patient/Family Preliminary Plans: Return home with Gregory [name] who has not committed to providing physical care to him. They do have four children who are local and involved. Will need to see if Gregory [name] is not going to do physical care then will see if a child will be there to provide this. Sw Barriers to Discharge: Decreased caregiver support, Home environment access/layout Sw Barriers to Discharge Comments: Annie-ex-wife may not want to provide physical care did not committ to it on assessment. May need ramp into home Social Work Anticipated Follow Up Needs: HH/OP, Support Group   Clinical Impression Pleasant gentleman who lives with his ex-wife and both still supportive of one another. They have four grown children who are local and involved. Pt will likely need physical assist at discharge will need to see who will be the responsible one to provide this with the dynamics the way they are. Will await therapy evaluations and work on safe discharge plan for pt.   Lucy Chris 04/11/2019, 11:55 AM             Rowe Warman, Gregory Livings, LCSW  Social Worker  Physical Medicine and Rehabilitation  Progress Notes  Signed  Date of Service:  04/11/2019 11:54 AM       Related encounter: Admission (Discharged) from 04/10/2019 in [facility] A      Signed        Show:Clear all [x] Manual[x] Template[] Copied  Added by: [x] Latoria Dry, Gregory Livings, LCSW  [] Hover for details Social Work  Social Work Assessment and Plan   Patient Details  Name: Alrick Waibel MRN: [id number] Date of Birth: 04/10/1951   Today's Date: 04/11/2019   Problem List:      Patient Active Problem List    Diagnosis Date Noted  . 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 Cerebral infarction due to embolism of left middle  cerebral artery (HCC)    . Stroke (cerebrum) (HCC) 04/05/2019  . Demand ischemia (HCC) 04/05/2019  . 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 Pre-diabetes 09/05/2018  . CKD (chronic kidney disease) stage 3, GFR 30-59 ml/min (HCC) 09/05/2018  . Bilateral carotid artery stenosis 09/05/2018  . Essential hypertension 09/05/2018  . TIA (transient ischemic attack) 09/04/2018    Past Medical History:      Past Medical History:  Diagnosis Date  . AKI (acute kidney injury) (HCC)    . CKD (chronic kidney disease) stage 2, GFR 60-89 ml/min    . CVA (cerebral vascular accident) (HCC)    . Hyperlipidemia    . Hypertension    .  PVD (peripheral vascular disease) (HCC)      Past Surgical History:       Past Surgical History:  Procedure Laterality Date  . AORTIC ARCH ANGIOGRAPHY N/A 09/09/2018    Procedure: AORTIC ARCH ANGIOGRAPHY;  Surgeon: Sherren Kerns, MD;  [facility];  Service: Cardiovascular;  Laterality: N/A;  . CAROTID ANGIOGRAPHY Bilateral 09/09/2018    Procedure: CAROTID ANGIOGRAPHY;  Surgeon: Sherren Kerns, MD;  Location: [facility] LAB;  Service: Cardiovascular;  Laterality: Bilateral;  . LOOP RECORDER INSERTION N/A 04/07/2019    Procedure: LOOP RECORDER INSERTION;  Surgeon: Marinus Maw, MD;  Location: [facility] INVASIVE CV LAB;  Service: Cardiovascular;  Laterality: N/A;    Social History:  has no history on file for tobacco, alcohol, and drug.   Family / Support Systems Marital Status: Divorced Patient Roles: Other (Comment), Parent(ex-spouse lives with him) Spouse/Significant Other: Gregory [name] [phone] Children: Verdis-son [phone] Three other children who are local also Other Supports: Friends  Anticipated Caregiver: Gregory [name] and Dorinda Hill  Ability/Limitations of Caregiver: Gregory [name] reports they will do what is needed. Between all of them Caregiver Availability: 24/7 Family Dynamics: Gregory [name] reports she loves him but didn't want to be committed to  him anymore. They have four children who are involved and supportive. 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 Since he and his wife are divorced, will look toward his children for any decisions while here. Sesar is the oldest and the one who is in charge now    Abuse/Neglect Abuse/Neglect Assessment Can Be Completed: Yes Physical Abuse: Denies Verbal Abuse: Denies Sexual Abuse: Denies Exploitation of patient/patient's resources: Denies Self-Neglect: Denies   Emotional Status Pt's affect, behavior and adjustment status: Pt can nod yes/no and wants to do well here. He was independent before this and hopeful to be again. He was in the hospital in 08/2018 after he had a TIA, but recovered from this. Anie reports he has always been hard headed and stubborn and has done what he wanted. This is partly the reason she divorced him too may baby Mommas Recent Psychosocial Issues: [facility] [place] Psychiatric History: No history deferred depression screening due to pt's inability to speak at this time. He is good with yes/no but will wait and see his progress beofre having neuro-psych see him here. Substance Abuse History: No issues   Patient / Family Perceptions, Expectations & Goals Pt/Family understanding of illness & functional limitations: Pt seems to realize the reason he is in the hospital and Gregory [name] can ecplain his stroke and deficits. Their son-Jolly talks with the MD and relays the information back to her. They are hopeful he will do well here and  recover from this stroke. Premorbid pt/family roles/activities: Father, friend, veteran, retiree, etc Anticipated changes in roles/activities/participation: resume Pt/family expectations/goals: Pt nods when talks about starting rehab today. Gregory [name] states: " I hope he does well there, he was independent before and only needed some supervision."   [employer]: Other (Comment)([facility] [place] [facility] services) Premorbid Home Care/DME Agencies: Other (Comment)(has multiple pieces of DME-scooter and rw, bsc, etc) Transportation available at discharge: Family  and Gregory [name] pt did not drive prior to admission Resource referrals recommended: Neuropsychology   Discharge Planning Living Arrangements: Spouse/significant other Support Systems: Spouse/significant other, Children, Other relatives, Friends/neighbors, Church/faith community Type of Residence: Private residence Insurance Resources: Media planner (specify)([employer] Medicare) Financial Resources: Social Security Financial Screen Referred: No Living Expenses: Own Money Management: Spouse Does the patient have any problems obtaining your medications?: No Home Management: Gregory [name] does the home management Patient/Family Preliminary Plans: Return home with Gregory [name] who has not committed to providing physical care to him. They do have four children who are local and involved. Will need to see if Gregory [name] is not going to do physical care then will see if a child will be there to provide this. Sw Barriers to Discharge: Decreased caregiver support, Home environment access/layout Sw Barriers to Discharge Comments: Annie-ex-wife may not want to provide physical care did not committ to it on assessment. May need ramp into home Social Work Anticipated Follow Up Needs: HH/OP, Support Group   Clinical Impression Pleasant gentleman who lives with his ex-wife and both still supportive of one another. They have four grown children who  are local and involved. Pt will likely need physical assist at discharge will need to see who will be the responsible one to provide this with the dynamics the way they are. Will await therapy evaluations and work on safe discharge plan for pt.   Lucy Chris 04/11/2019, 11:55 AM             Fedra Lanter, Gregory Livings, LCSW  Social Worker  Physical Medicine and Rehabilitation  Progress Notes  Signed  Date of Service:  04/11/2019 11:54 AM       Related encounter: Admission (Discharged) from 04/10/2019 in [facility] A      Signed        Show:Clear all Manual[x] Template[] Copied  Added by: Karilynn Carranza, Gregory Livings, LCSW  Hover for details Social Work  Social Work Assessment and Plan   Patient Details  Name: Gregory [name] MRN: [id number] Date of Birth: 17-Apr-1951   Today's Date: 04/11/2019   Problem List:      Patient Active Problem List    Diagnosis Date Noted  . 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 Cerebral infarction due to embolism of left middle cerebral artery (HCC)    . Stroke (cerebrum) (HCC) 04/05/2019  . Demand ischemia (HCC) 04/05/2019  . 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 Pre-diabetes 09/05/2018  . CKD (chronic kidney disease) stage 3, GFR 30-59 ml/min (HCC) 09/05/2018  . Bilateral carotid artery stenosis 09/05/2018  . Essential hypertension 09/05/2018  . TIA (transient ischemic attack) 09/04/2018    Past Medical History:      Past Medical History:  Diagnosis Date  . AKI (acute kidney injury) (HCC)    . CKD (chronic kidney disease) stage 2, GFR 60-89 ml/min    . CVA (cerebral vascular accident) (HCC)    . Hyperlipidemia    . Hypertension    . PVD (peripheral vascular disease) (HCC)      Past Surgical History:       Past Surgical History:  Procedure Laterality Date  . AORTIC ARCH ANGIOGRAPHY N/A 09/09/2018    Procedure: AORTIC ARCH ANGIOGRAPHY;  Surgeon: Sherren Kerns, MD;   Location: [facility] INVASIVE CV LAB;  Service: Cardiovascular;  Laterality: N/A;  . CAROTID ANGIOGRAPHY Bilateral 09/09/2018    Procedure: CAROTID ANGIOGRAPHY;  Surgeon: Sherren Kerns, MD;  Location: [facility] INVASIVE CV LAB;  Service:  Cardiovascular;  Laterality: Bilateral;  . LOOP RECORDER INSERTION N/A 04/07/2019    Procedure: LOOP RECORDER INSERTION;  Surgeon: Marinus Maw, MD;  Location: [facility] INVASIVE CV LAB;  Service: Cardiovascular;  Laterality: N/A;    Social History:  has no history on file for tobacco, alcohol, and drug.   Family / Support Systems Marital Status: Divorced Patient Roles: Other (Comment), Parent(ex-spouse lives with him) Spouse/Significant Other: Gregory [name] [phone] Children: Farooq-son [phone] Three other children who are local also Other Supports: Friends  Anticipated Caregiver: Gregory [name] and Dorinda Hill  Ability/Limitations of Caregiver: Gregory [name] reports they will do what is needed. Between all of them Caregiver Availability: 24/7 Family Dynamics: Gregory [name] reports she loves him but didn't want to be committed to him anymore. They have four children who are involved and supportive. 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 Since he and his wife are divorced, will look toward his children for any decisions while here. Ryot is the oldest and the one who is in charge now    Abuse/Neglect Abuse/Neglect Assessment Can Be Completed: Yes Physical Abuse: Denies Verbal Abuse: Denies Sexual Abuse: Denies Exploitation of patient/patient's resources: Denies Self-Neglect: Denies    Emotional Status Pt's affect, behavior and adjustment status: Pt can nod yes/no and wants to do well here. He was independent before this and hopeful to be again. He was in the hospital in 08/2018 after he had a TIA, but recovered from this. Anie reports he has always been hard headed and stubborn and has done what he wanted. This is partly the reason she divorced him too may baby Mommas Recent Psychosocial Issues: [facility] health [facility] [place] Psychiatric History: No history deferred depression screening due to pt's inability to speak at this time. He is good with yes/no but will wait and see his progress beofre having neuro-psych see him here. Substance Abuse History: No issues   Patient / Family Perceptions, Expectations & Goals Pt/Family understanding of illness & functional limitations: Pt seems to realize the reason he is in the hospital and Gregory [name] can ecplain his stroke and deficits. Their son-Steffan talks with the MD and relays the information back to her. They are hopeful he will do well here and recover from this stroke. Premorbid pt/family roles/activities: Father, friend, veteran, retiree, etc Anticipated changes in roles/activities/participation: resume Pt/family expectations/goals: Pt nods when talks about starting rehab today. Gregory [name] states: " I hope he does well there, he was independent before and only needed some supervision."   [employer]: Other (Comment)([facility] in [place] never had HH services) Premorbid Home Care/DME Agencies: Other (Comment)(has multiple pieces of DME-scooter and rw, bsc, etc) Transportation available at discharge: Family and Gregory [name] pt did not drive prior to admission Resource referrals recommended: Neuropsychology   Discharge Planning Living Arrangements: Spouse/significant other Support Systems: Spouse/significant other, Children, Other relatives, Friends/neighbors, Church/faith community Type of Residence:  Private residence Insurance Resources: Media planner (specify)([employer] Medicare) Financial Resources: Social Security Financial Screen Referred: No Living Expenses: Own Money Management: Spouse Does the patient have any problems obtaining your medications?: No Home Management: Gregory [name] does the home management Patient/Family Preliminary Plans: Return home with Gregory [name] who has not committed  to providing physical care to him. They do have four children who are local and involved. Will need to see if Gregory Bossnnie is not going to do physical care then will see if a child will be there to provide this. Sw Barriers to Discharge: Decreased caregiver support, Home environment access/layout Sw Barriers to Discharge Comments: Annie-ex-wife may not want to provide physical care did not committ to it on assessment. May need ramp into home Social Work Anticipated Follow Up Needs: HH/OP, Support Group   Clinical Impression Pleasant gentleman who lives with his ex-wife and both still supportive of one another. They have four grown children who are local and involved. Pt will likely need physical assist at discharge will need to see who will be the responsible one to provide this with the dynamics the way they are. Will await therapy evaluations and work on safe discharge plan for pt.   Lucy Chrisupree, Courtni Balash G 04/11/2019, 11:55 AM             Shamariah Shewmake, Gregory Livingsebecca G, LCSW  Social Worker  Physical Medicine and Rehabilitation  Progress Notes  Signed  Date of Service:  04/11/2019 11:54 AM       Related encounter: [facility] (Discharged[facility]10/2019 [facility]      Signed        Show:Clear all [x] Manual[x] Template[] Copied  Added by: [x] Makenzi Bannister, Gregory Livingsebecca G, LCSW  [] Hover for details Social Work  Social Work Assessment and Plan   Patient Details  Name: Caren HazyDonald Lee Hartshorne MRN: [id number] Date of Birth: 12-24-1950   Today's Date: 04/11/2019   Problem List:      Patient  Active Problem List    Diagnosis Date Noted  . 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 Cerebral infarction due to embolism of left middle cerebral artery (HCC)    . Stroke (cerebrum) (HCC) 04/05/2019  . Demand ischemia (HCC) 04/05/2019  . 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 Pre-diabetes 09/05/2018  . CKD (chronic kidney disease) stage 3, GFR 30-59 ml/min (HCC) 09/05/2018  . Bilateral carotid artery stenosis 09/05/2018  . Essential hypertension 09/05/2018  . TIA (transient ischemic attack) 09/04/2018    Past Medical History:      Past Medical History:  Diagnosis Date  . AKI (acute kidney injury) (HCC)    . CKD (chronic kidney disease) stage 2, GFR 60-89 ml/min    . CVA (cerebral vascular accident) (HCC)    . Hyperlipidemia    . Hypertension    . PVD (peripheral vascular disease) (HCC)      Past Surgical History:       Past Surgical History:  Procedure Laterality Date  . AORTIC ARCH ANGIOGRAPHY N/A 09/09/2018    Procedure: AORTIC ARCH ANGIOGRAPHY;  Surgeon: Sherren KernsFields, Charles E, MD;  Location: [facility] INVAS[facility] CV LAB;  Service: Cardiovascular;  Laterality: N/A;  . CAROTID ANGIOGRAPHY Bilateral 09/09/2018    Procedure: CAROTID ANGIOGRAPHY;  Surgeon: Sherren KernsFields, Charles E, MD;  Location: [facility] INVASIVE CV LAB;  Service: Cardiovascular;  Laterality: Bilateral;  . LOOP RECORDER INSERTION N/A 04/07/2019    Procedure: LOOP RECORDER INSERTION;  Surgeon: Marinus Mawaylor, Gregg W, MD;  [facility];  Service: Cardiovascular;  Laterality: N/A;    Social History:  has no history on file for tobacco, alcohol, and drug.   Family / Support Systems Marital Status: Divorced Patient Roles: Other (Comment), Parent(ex-spouse lives with him) Spouse/Significant Other: Gregory Bossnnie [phone] Children: Abeer-son [phone] Three other children who are local also Other Supports:  Friends  Anticipated Caregiver: Gregory [name] and Dorinda Hill  Ability/Limitations of Caregiver: Gregory [name] reports they will  do what is needed. Between all of them Caregiver Availability: 24/7 Family Dynamics: Gregory [name] reports she loves him but didn't want to be committed to him anymore. They have four children who are involved and supportive. 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 Since he and his wife are divorced, will look toward his children for any decisions while here. Garrick is the oldest and the one who is in charge now    Abuse/Neglect Abuse/Neglect Assessment Can Be Completed: Yes Physical Abuse: Denies Verbal Abuse: Denies Sexual Abuse: Denies Exploitation of patient/patient's resources: Denies Self-Neglect: Denies   Emotional Status Pt's affect, behavior and adjustment status: Pt can nod yes/no and wants to do well here. He was independent before this and hopeful to be again. He was in the hospital in 08/2018 after he had a TIA, but recovered from this. Anie reports he has always been hard headed and stubborn and has done what he wanted. This is partly the reason she divorced him too may baby Mommas Recent Psychosocial Issues: Other health [facility] [place] Psychiatric History: No history deferred depression screening due to pt's inability to speak at this time. He is good with yes/no but will wait and see his progress beofre having neuro-psych see him here. Substance Abuse History: No issues   Patient / Family Perceptions, Expectations & Goals Pt/Family understanding of illness & functional limitations: Pt seems to realize the reason he is in the hospital and Gregory [name] can  ecplain his stroke and deficits. Their son-Adrian talks with the MD and relays the information back to her. They are hopeful he will do well here and recover from this stroke. Premorbid pt/family roles/activities: Father, friend, veteran, retiree, etc Anticipated changes in roles/activities/participation: resume Pt/family expectations/goals: Pt nods when talks about starting rehab today. Gregory [name] states: " I hope he does well there, he was independent before and only needed some supervision."   [employer]: Other (Comment)([facility] [place] [facility] services) Premorbid Home Care/DME Agencies: Other (Comment)(has multiple pieces of DME-scooter and rw, bsc, etc) Transportation available at discharge: Family and Gregory [name] pt did not drive prior to admission Resource referrals recommended: Neuropsychology   Discharge Planning Living Arrangements: Spouse/significant other Support Systems: Spouse/significant other, Children, Other relatives, Friends/neighbors, Church/faith community Type of Residence: Private residence Insurance Resources: Media planner (specify)([employer] Medicare) Financial Resources: Social Security Financial Screen Referred: No Living Expenses: Own Money Management: Spouse Does the patient have any problems obtaining your medications?: No Home Management: Gregory [name] does the home management Patient/Family Preliminary Plans: Return home with Gregory [name] who has not committed to providing physical care to him. They do have four children who are local and involved. Will need to see if Gregory [name] is not going to do physical care then will see if a child will be there to provide this. Sw Barriers to Discharge: Decreased caregiver support, Home environment access/layout Sw Barriers to Discharge Comments: Annie-ex-wife may not want to provide physical care did not committ to it on assessment. May need ramp into home Social Work Anticipated Follow Up Needs: HH/OP, Support  Group   Clinical Impression Pleasant gentleman who lives with his ex-wife and both still supportive of one another. They have four grown children who are local and involved. Pt will likely need physical assist at discharge will need to see who will be the responsible one to provide this with the dynamics the way they are. Will await therapy evaluations and work on safe discharge plan for pt.   Lucy Chris 04/11/2019, 11:55 AM

## 2019-04-19 NOTE — Progress Notes (Signed)
CRITICAL VALUE ALERT  Critical Value:  Potassium 6.6  Date & Time Notied:  04/19/19 1800  Provider Notified: Cruzita Lederer PA. Nephrology on Call: Kathrene Bongo   Orders Received/Actions taken:  New orders placed for patient

## 2019-04-19 NOTE — Evaluation (Signed)
Speech Language Pathology Assessment and Plan  Patient Details  Name: Gregory [name] MRN: [id number] Date of Birth: 1951/10/31  SLP Diagnosis: Aphasia;Apraxia;Dysphagia;Speech and Language deficits  Rehab Potential: Good ELOS:      Today's Date: 04/19/2019 SLP Individual Time: 0900-1000 SLP Individual Time Calculation (min): 60 min   Problem List:  Patient Active Problem List   Diagnosis Date Noted  . 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 Hyperkalemia 04/14/2019  . 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 Embolic cerebral infarction ([place]) 04/10/2019  . Cerebral infarction due to embolism of left middle cerebral artery ([place])   . Stroke (cerebrum) ([place]) 04/05/2019  . Demand ischemia ([place]) 04/05/2019  . Multifocal atrial tachycardia ([place]) 04/05/2019  . Pre-diabetes 09/05/2018  . Acute renal failure superimposed on stage 3 chronic kidney disease ([place]) 09/05/2018  . Bilateral carotid artery stenosis 09/05/2018  . Essential hypertension 09/05/2018  . TIA (transient ischemic attack) 09/04/2018   Past Medical History:  Past Medical History:  Diagnosis Date  . AKI (acute kidney injury) ([place])   . CKD (chronic kidney disease) stage 2, GFR 60-89 ml/min   . CVA (cerebral vascular accident) ([place])   . Hyperlipidemia   . Hypertension   . PVD (peripheral vascular disease) ([place])    Past Surgical History:  Past Surgical History:  Procedure Laterality Date  . AORTIC ARCH ANGIOGRAPHY N/A 09/09/2018   Procedure: AORTIC ARCH ANGIOGRAPHY;  Surgeon: Elam Dutch, MD;  Location: [place] [facility];  Service: Cardiovascular;  Laterality: N/A;  . CAROTID ANGIOGRAPHY Bilateral 09/09/2018   Procedure: CAROTID ANGIOGRAPHY;  Surgeon: Elam Dutch, MD;  Location: [place] [facility];  Service: Cardiovascular;  Laterality: Bilateral;  . LOOP RECORDER INSERTION N/A 04/07/2019   Procedure: LOOP RECORDER INSERTION;  Surgeon: Evans Lance, MD;  Location: [facility];  Service:  Cardiovascular;  Laterality: N/A;    Assessment / Plan / Recommendation Clinical Impression Pt is a 68 y.o. male with medical history significant for chronic kidney disease stage III, hypertension, hyperlipidemia, who presented to the hospital with chief complaint of sudden onset right-sided weakness and inability to talk.  MRI of the brain revealed 3.5 cm region of acute infarction affecting the left insula and posterior frontal region. Patient was admitted to to inpatient CIR on 04/10/19, D/Cd from [facility] 04/14/19 with hyperkalemia, then readmitted to CIR on 4/28.   Swallowing: He demonstrates improved swallow function since last MBS 4/17. He continues to present with an oral impairment, but trials of ice chips and thin liquids did not elicit overt concerns for aspiration.  Honey-thick liquids continue to be tolerated well, and recommend continuing current dysphagia 1 diet, honey thick liquids pending repeat MBS.  Study is scheduled for next date.  Speech/communication: Pt's speech/language deficits have evolved since initial acute admission.  He demonstrates improvements in comprehension, particularly with object identification, command-following, and understanding of basic conversation.  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  He demonstrates groping behaviors as he tries to produce targeted words and carry out targeted tasks upon command.  He is able to convey yes/no with improved accuracy (and reliability re: basic information).  He shows emerging ability to imitate salient consonants (m, b, t) and combine C-V words.  Pragmatics are intact. He is able to trace shapes and some letters, but unable to write to dication.  Pt will benefit from intensive speech/language therapy to address swallowing, speech motor output, as well  as a longer-term solution using augmentative communication resources.    Skilled Therapeutic Interventions           Cognitive-linguistic and bedside swallow evaluations completed with results and recommendations reviewed with the pt.   SLP Assessment  Patient will need skilled Speech Lanaguage Pathology Services during CIR admission    Recommendations  SLP Diet Recommendations: Honey;Dysphagia 1 (Puree) Liquid Administration via: Cup Medication Administration: Crushed with puree Supervision: Full supervision/cueing for compensatory strategies;Patient able to self feed;Staff to assist with self feeding Compensations: Slow rate;Small sips/bites;Monitor for anterior loss;Lingual sweep for clearance of pocketing;Minimize environmental distractions Postural Changes and/or Swallow Maneuvers: Seated upright 90 degrees;Upright 30-60 min after meal Oral Care Recommendations: Oral care BID Patient destination: Home Follow up Recommendations: Home Health SLP Equipment Recommended: To be determined    SLP Frequency 3 to 5 out of 7 days   SLP Duration  SLP Intensity  SLP Treatment/Interventions    Minumum of 1-2 x/day, 30 to 90 minutes  Environmental controls;Speech/Language facilitation;Cueing hierarchy;Functional tasks;Therapeutic Activities;Oral motor exercises;Dysphagia/aspiration precaution training;Patient/family education    Pain Pain Assessment Pain Scale: Faces Faces Pain Scale: No hurt  Prior Functioning Type of Home: House  Lives With: Family Available Help at Discharge: Family Vocation: Retired  Industrial/product designer Term Goals: Week 1: SLP Short Term Goal 1 (Week 1): Pt will tolerate PO diet (TBD after this week's MBS) without overt s/s aspiration given min A verbal and visual cues for use of safe swallow strategies. SLP Short Term Goal 2 (Week 1): Pt will follow two-step verbal directions with 80% accuracy. SLP Short Term Goal 3 (Week 1): Pt will successfully communicate name of object (blind to clinician) through use of augmentative communication resources on 7/10 trials.  SLP Short Term Goal 4  (Week 1): pt will produce 3-5 vowel sounds and or C-V combinations with 50% accuracy given max verbal/visual cues.  Refer to Care Plan for Long Term Goals  Recommendations for other services: None   Discharge Criteria: Patient will be discharged from SLP if patient refuses treatment 3 consecutive times without medical reason, if treatment goals not met, if there is a change in medical status, if patient makes no progress towards goals or if patient is discharged from hospital.  The above assessment, treatment plan, treatment alternatives and goals were discussed and mutually agreed upon: by patient  Assunta Curtis 04/19/2019, 4:55 PM

## 2019-04-19 NOTE — Evaluation (Signed)
Occupational Therapy Assessment and Plan  Patient Details  Name: Gregory [name] MRN: [id number] Date of Birth: [birth year]  OT Diagnosis: abnormal posture, cognitive deficits, hemiplegia affecting dominant side, muscle weakness (generalized) and coordination disorder Rehab Potential: Rehab Potential (ACUTE ONLY): Fair ELOS: 3-4 weeks   Today's Date: 04/19/2019 OT Individual Time: 1230-1330 OT Individual Time Calculation (min): 60 min     Problem List:  Patient Active Problem List   Diagnosis Date Noted  . 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 Hyperkalemia 04/14/2019  . 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 Embolic cerebral infarction ([place]) 04/10/2019  . Cerebral infarction due to embolism of left middle cerebral artery ([place])   . Stroke (cerebrum) ([place]) 04/05/2019  . Demand ischemia ([place]) 04/05/2019  . Multifocal atrial tachycardia ([place]) 04/05/2019  . Pre-diabetes 09/05/2018  . Acute renal failure superimposed on stage 3 chronic kidney disease ([place]) 09/05/2018  . Bilateral carotid artery stenosis 09/05/2018  . Essential hypertension 09/05/2018  . TIA (transient ischemic attack) 09/04/2018    Past Medical History:  Past Medical History:  Diagnosis Date  . AKI (acute kidney injury) ([place])   . CKD (chronic kidney disease) stage 2, GFR 60-89 ml/min   . CVA (cerebral vascular accident) ([place])   . Hyperlipidemia   . Hypertension   . PVD (peripheral vascular disease) ([place])    Past Surgical History:  Past Surgical History:  Procedure Laterality Date  . AORTIC ARCH ANGIOGRAPHY N/A 09/09/2018   Procedure: AORTIC ARCH ANGIOGRAPHY;  Surgeon: Elam Dutch, MD;  Location: [place] [facility];  Service: Cardiovascular;  Laterality: N/A;  . CAROTID ANGIOGRAPHY Bilateral 09/09/2018   Procedure: CAROTID ANGIOGRAPHY;  Surgeon: Elam Dutch, MD;  Location: [place] [facility];  Service: Cardiovascular;  Laterality: Bilateral;  . LOOP RECORDER INSERTION N/A 04/07/2019   Procedure: LOOP  RECORDER INSERTION;  Surgeon: Evans Lance, MD;  Location: [place] [facility];  Service: Cardiovascular;  Laterality: N/A;    Assessment & Plan Clinical Impression: Patient is a 68 y.o. year old male with history of chronic kidney disease stage III with creatinine baseline 1.6-1.7, hypertension maintained on HCTZ and lisinopril, hyperlipidemia. Per chart review lives with spouse reportedly independent prior to admission. Presented 04/05/2019 with acute onset of right-sided weakness and slurred speech. Cranial CT scan showed possible early gray-white differentiation loss in the insula on the left. Chronic small vessel ischemic changes. 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 No evidence of gross hemorrhage. MRA negative. Patient did not receive TPA. Noted findings of hyperkalemia 6.8 as well as elevated creatinine 10.4 from baseline 1.70, troponin 3.29.EKG with ST changes. Cardiology services consulted for suspect non-STEMI medical management recommended. Patient did undergo placement of loop recorder 04/07/2019 per Dr. Lovena Le. Renal ultrasound showed bilateral hydronephrosis with nephrology services consulted as well as urology. A Foley catheter tube was placed and would remain in place until follow-up outpatient urology. Renal function continued to improve and monitored with creatinine 4.08. Maintained on aspirin for CVA prophylaxis. Subcutaneous heparin for DVT prophylaxis. Dysphagia #1 honey thick liquid diet. Patient was admitted to inpatient rehab services 04/10/2019 with steady progressive gains. Noted on 04/14/2019 with noted ongoing bouts of hyperkalemia and did not respond as expected to Lasix.. Nephrology recommended Kayexalate enema and insulin with dextrose. Patient became more confused and it was felt would benefit from discharge to acute care services for ongoing monitoring. EKG follow-up without peaked T  waves hemodynamically stable but remained  on telemetry. Hyperkalemia improving 5.8 and placed on VELTASSA16.8g dailyand patient and family were to be instructed also on a low potassium diet restriction .Follow-up renal services in regards to hyperkalemia acute kidney injury no plan for dialysis at this time .  Patient transferred to CIR on 04/18/2019 .    Patient currently requires max- total A with basic self-care skills secondary to muscle weakness, decreased cardiorespiratoy endurance, abnormal tone and decreased coordination, decreased problem solving, decreased safety awareness and delayed processing and decreased sitting balance, decreased standing balance, decreased postural control, hemiplegia and decreased balance strategies.  Prior to hospitalization, patient could complete ADLs and IADLs with independent .  Patient will benefit from skilled intervention to decrease level of assist with basic self-care skills prior to discharge home with care partner.  Anticipate patient will require 24 hour supervision and minimal physical assistance and follow up home health.  OT - End of Session Activity Tolerance: Decreased this session Endurance Deficit: Yes Endurance Deficit Description: fatigues easily, increased work of breathing at baseline w/ all mobility OT Assessment Rehab Potential (ACUTE ONLY): Fair OT Barriers to Discharge: Other (comments) OT Barriers to Discharge Comments: unsure of family support OT Patient demonstrates impairments in the following area(s): Balance;Behavior;Skin Integrity;Cognition;Edema;Endurance;Motor;Pain;Perception;Safety;Sensory OT Basic ADL's Functional Problem(s): Eating;Grooming;Bathing;Dressing;Toileting OT Transfers Functional Problem(s): Toilet;Tub/Shower OT Additional Impairment(s): Fuctional Use of Upper Extremity OT Plan OT Intensity: Minimum of 1-2 x/day, 45 to 90 minutes OT Frequency: 5 out of 7 days OT Duration/Estimated Length of Stay: 3-4  weeks OT Treatment/Interventions: Balance/vestibular training;Cognitive remediation/compensation;Discharge planning;DME/adaptive equipment instruction;Functional electrical stimulation;Neuromuscular re-education;Functional mobility training;Pain management;Patient/family education;Psychosocial support;Self Care/advanced ADL retraining;Skin care/wound managment;Splinting/orthotics;Therapeutic Activities;Therapeutic Exercise;UE/LE Strength taining/ROM;UE/LE Coordination activities;Visual/perceptual remediation/compensation;Wheelchair propulsion/positioning OT Basic Self-Care Anticipated Outcome(s): min A OT Toileting Anticipated Outcome(s): min A OT Bathroom Transfers Anticipated Outcome(s): min A OT Recommendation Recommendations for Other Services: Other (comment)(none at this time) Patient destination: Home Follow Up Recommendations: Home health OT;24 hour supervision/assistance Equipment Recommended: 3 in 1 bedside comode;Tub/shower bench   Skilled Therapeutic Intervention Upon entering the room, pt sleeping soundly in bed but agreeable to OT intervention. Pt with no c/o pain this session. OT educating pt on OT purpose, POC, and goals with pt nodding head understanding and agreement. Pt agreeable to shower this session. Sit >stand inside of stedy with max A. OT transferring pt onto TTB with use of stedy and pt standing with min - mod A . Pt needing assistance for R UE for safety with standing into stedy and sitting as well. Pt with supervision - min guard sitting balance on TTB while bathing. Pt needing min verbal cuing for sequencing of tasks. Pt fatiguing very quickly and pt assisted back to bed via stedy in same manner. Hospital gown donned with max A and pt standing with mod A while in stedy for therapist to provide total A to don brief. As pt continued to fatigue he began leaning forward and to the R with slight pushing noted as well. Sit >supine with mod A for B LEs into bed. Pt asleep before  therapist exiting the room. Bed alarm activated and call bell within reach.   OT Evaluation Precautions/Restrictions  Precautions Precautions: Fall Precaution Comments: Expressive aphasia, R hemiparesis Restrictions Weight Bearing Restrictions: No  Pain Pain Assessment Pain Scale: Faces Faces Pain Scale: No hurt Home Living/Prior Functioning Home Living Family/patient expects to be discharged to:: Private residence Living Arrangements: Spouse/significant other Available Help at Discharge: Family Type of Home: House Home Access: Stairs to enter [employer] of Steps: 3-4  Entrance Stairs-Rails: Can reach both Home Layout: One level Bathroom Shower/Tub: Chiropodist: Standard Additional Comments: home set-up information taken from chart review, pt confirmed everything via head nods  Lives With: Family Prior Function Level of Independence: Independent with homemaking with ambulation, Independent with gait, Independent with basic ADLs, Independent with transfers  Able to Take Stairs?: Yes Driving: Yes Vocation: Retired Comments: PLOF taken from chart review, pt confirmed all information w/ head nods Vision Baseline Vision/History: Wears glasses Wears Glasses: Reading only Patient Visual Report: Blurring of vision;Diplopia Vision Assessment?: Yes Eye Alignment: Within Functional Limits Ocular Range of Motion: Within Functional Limits Tracking/Visual Pursuits: Requires cues, head turns, or add eye shifts to track Saccades: Overshoots Perception  Perception: Within Functional Limits Praxis Praxis: Impaired Praxis Impairment Details: Motor planning Cognition Overall Cognitive Status: Impaired/Different from baseline Arousal/Alertness: Awake/alert Orientation Level: Person;Place;Situation Person: Oriented Place: Oriented Situation: Oriented Year: 2018 Month: May Day of Week: Incorrect Attention: Selective Selective Attention: Appears  intact Safety/Judgment: Appears intact Comments: difficult to assess due to aphasia Sensation Sensation Light Touch: Impaired by gross assessment Central sensation comments: sensation difficult to formally assess, R LE/UE sensation appears impaired compared to L side Additional Comments: sensation difficult to formally assess, R LE/UE sensation appears impaired compared to L side Coordination Gross Motor Movements are Fluid and Coordinated: No Fine Motor Movements are Fluid and Coordinated: No Motor  Motor Motor: Hemiplegia;Abnormal tone Motor - Skilled Clinical Observations: R hemi, increased R tone UE>LE Mobility  Bed Mobility Bed Mobility: Rolling Right;Rolling Left;Supine to Sit;Sit to Supine Rolling Right: Minimal Assistance - Patient > 75% Rolling Left: Minimal Assistance - Patient > 75% Supine to Sit: Moderate Assistance - Patient 50-74% Sit to Supine: Moderate Assistance - Patient 50-74% Transfers Sit to Stand: Maximal Assistance - Patient 25-49% Stand to Sit: Maximal Assistance - Patient 25-49%  Trunk/Postural Assessment  Cervical Assessment Cervical Assessment: Within Functional Limits Thoracic Assessment Thoracic Assessment: Exceptions to WFL(rounded shoulders) Lumbar Assessment Lumbar Assessment: Exceptions to WFL(posterior pelvic tilt) Postural Control Postural Control: Deficits on evaluation  Balance Balance Balance Assessed: Yes Static Sitting Balance Static Sitting - Balance Support: Bilateral upper extremity supported;Feet supported Static Sitting - Level of Assistance: 5: Stand by assistance Dynamic Sitting Balance Dynamic Sitting - Balance Support: Feet supported;Left upper extremity supported Dynamic Sitting - Level of Assistance: 4: Min assist Static Standing Balance Static Standing - Balance Support: Left upper extremity supported;During functional activity Static Standing - Level of Assistance: 3: Mod assist Dynamic Standing Balance Dynamic  Standing - Balance Support: Left upper extremity supported;During functional activity Dynamic Standing - Level of Assistance: 2: Max assist Extremity/Trunk Assessment RUE Assessment RUE Assessment: Exceptions to [facility] Strength Comments: 2-/5 at shoulder, 2-/5 elbow, 0/5 wrist and digits  with increased flexor tone LUE Assessment LUE Assessment: Within Functional Limits     Refer to Care Plan for Long Term Goals  Recommendations for other services: None    Discharge Criteria: Patient will be discharged from OT if patient refuses treatment 3 consecutive times without medical reason, if treatment goals not met, if there is a change in medical status, if patient makes no progress towards goals or if patient is discharged from hospital.  The above assessment, treatment plan, treatment alternatives and goals were discussed and mutually agreed upon: by patient  Gypsy Decant 04/19/2019, 1:18 PM

## 2019-04-19 NOTE — Patient Care Conference (Signed)
Inpatient RehabilitationTeam Conference and Plan of Care Update Date: 04/19/2019   Time: 11:30 AM    Patient Name: Gregory [name]      Medical Record Number: [id number]  Date of Birth: 08/14/1951 Sex: Male         Room/Bed: 4W24C/4W24C-01 Payor Info: Payor: [employer] MEDICARE / Plan: [employer] MEDICARE HMO / Product Type: *No Product type* /    Admitting Diagnosis: cva  Admit Date/Time:  04/18/2019  4:32 PM Admission Comments: No comment available   Primary Diagnosis:  <principal problem not specified> Principal Problem: <principal problem not specified>  Patient Active Problem List   Diagnosis Date Noted  . 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 Hyperkalemia 04/14/2019  . 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 Cerebral infarction due to embolism of left middle cerebral artery ([facility])   . Stroke (cerebrum) ([facility]) 04/05/2019  . Demand ischemia ([facility]) 04/05/2019  . 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 Pre-diabetes 09/05/2018  . Acute renal failure superimposed on stage 3 chronic kidney disease ([facility]) 09/05/2018  . Bilateral carotid artery stenosis 09/05/2018  . Essential hypertension 09/05/2018  . TIA (transient ischemic attack) 09/04/2018    Expected Discharge Date:    Team Members Present: Physician leading conference: Dr. Claudette Laws Social Worker Present: Dossie Der, LCSW Nurse Present: Allayne Stack, RN PT Present: Woodfin Ganja, PT OT Present: Perrin Maltese, OT SLP Present: Reuel Derby, SLP PPS Coordinator present : Fae Pippin     Current Status/Progress Goal Weekly Team Focus  Medical   Hyperkalemia persists, BP control is good, still aphasic with Right Hemiparesis  Reduce aspiration risk  manage K   Bowel/Bladder   incontinent of bowel LBM 4/28  foley care qshift  assess and assist with toileting needs qshift and PRN   Swallow/Nutrition/ Hydration             ADL's     eval pending         Mobility   Evaluation pending  evaluation pending  transfers, mobility, gait   Communication             Safety/Cognition/ Behavioral Observations            Pain   No complaint or s/s of pain   continue to be free of pain  assess pain qshift and PRN   Skin   No skin impairments  remain free of pressure injuries  Assess skin qshift and PRN      *See Care Plan and progress notes for long and short-term goals.     Barriers to Discharge  Current Status/Progress Possible Resolutions Date Resolved   Physician    Medical stability;Incontinence     electrolyte disturbance  longer LOS, Nephro      Nursing  Medical stability;Decreased caregiver support               PT  Inaccessible home environment;Home environment access/layout  3-4 steps to enter home              OT                  SLP                SW                Discharge Planning/Teaching Needs:    Home with ex-wife and their children to assist with his care. Aware will need 24 hr physical care  Team Discussion:  New eval and setting goals. More alert and yes/no better. MBS tomorrow. Medically stable and nephrology managing renal status  Revisions to Treatment Plan:  New eval    Continued Need for Acute Rehabilitation Level of Care: The patient requires daily medical management by a physician with specialized training in physical medicine and rehabilitation for the following conditions: Daily direction of a multidisciplinary physical rehabilitation program to ensure safe treatment while eliciting the highest outcome that is of practical value to the patient.: Yes Daily medical management of patient stability for increased activity during participation in an intensive rehabilitation regime.: Yes Daily analysis of laboratory values and/or radiology reports with any subsequent need for medication adjustment of medical intervention for : Neurological problems;Mood/behavior problems   I attest that I was present,  lead the team conference, and concur with the assessment and plan of the team.   Lucy Chrisupree, Joni Norrod G 04/19/2019, 1:09 PM

## 2019-04-19 NOTE — Evaluation (Signed)
Physical Therapy Assessment and Plan  Patient Details  Name: Gregory [name] MRN: [id number] Date of Birth: 09/29/1951  PT Diagnosis: Abnormal posture, Abnormality of gait, Cognitive deficits, Coordination disorder, Difficulty walking, Hemiplegia dominant, Impaired cognition, Impaired sensation and Muscle weakness Rehab Potential: Good ELOS: 3-4 weeks   Today's Date: 04/19/2019 PT Individual Time: 0800-0855 PT Individual Time Calculation (min): 55 min    Problem List:  Patient Active Problem List   Diagnosis Date Noted  . 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 Hyperkalemia 04/14/2019  . 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 Cerebral infarction due to embolism of left middle cerebral artery ([place])   . Stroke (cerebrum) ([place]) 04/05/2019  . Demand ischemia ([place]) 04/05/2019  . 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 Pre-diabetes 09/05/2018  . Acute renal failure superimposed on stage 3 chronic kidney disease ([place]) 09/05/2018  . Bilateral carotid artery stenosis 09/05/2018  . Essential hypertension 09/05/2018  . TIA (transient ischemic attack) 09/04/2018    Past Medical History:  Past Medical History:  Diagnosis Date  . AKI (acute kidney injury) ([place])   . CKD (chronic kidney disease) stage 2, GFR 60-89 ml/min   . CVA (cerebral vascular accident) ([place])   . Hyperlipidemia   . Hypertension   . PVD (peripheral vascular disease) ([place])    Past Surgical History:  Past Surgical History:  Procedure Laterality Date  . AORTIC ARCH ANGIOGRAPHY N/A 09/09/2018   Procedure: AORTIC ARCH ANGIOGRAPHY;  Surgeon: Elam Dutch, MD;  Location: [place] [facility];  Service: Cardiovascular;  Laterality: N/A;  . CAROTID ANGIOGRAPHY Bilateral 09/09/2018   Procedure: CAROTID ANGIOGRAPHY;  Surgeon: Elam Dutch, MD;  Location: [place] [facility];  Service: Cardiovascular;  Laterality: Bilateral;  . LOOP RECORDER INSERTION N/A 04/07/2019    Procedure: LOOP RECORDER INSERTION;  Surgeon: Evans Lance, MD;  Location: [place] [facility];  Service: Cardiovascular;  Laterality: N/A;    Assessment & Plan Clinical Impression: Patient is a 68 year old right-handed male with history of chronic kidney disease stage III with creatinine baseline 1.6-1.7, hypertension maintained on HCTZ and lisinopril, hyperlipidemia. Per chart review lives with spouse reportedly independent prior to admission. Presented 04/05/2019 with acute onset of right-sided weakness and slurred speech. Cranial CT scan showed possible early gray-white differentiation loss in the insula on the left. Chronic small vessel ischemic changes. 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 No evidence of gross hemorrhage. MRA negative. Patient did not receive TPA. Noted findings of hyperkalemia 6.8 as well as elevated creatinine 10.4 from baseline 1.70, troponin 3.29.EKG with ST changes. Cardiology services consulted for suspect non-STEMI medical management recommended. Patient did undergo placement of loop recorder 04/07/2019 per Dr. Lovena Le. Renal ultrasound showed bilateral hydronephrosis with nephrology services consulted as well as urology. A Foley catheter tube was placed and would remain in place until follow-up outpatient urology. Renal function continued to improve and monitored with creatinine 4.08. Maintained on aspirin for CVA prophylaxis. Subcutaneous heparin for DVT prophylaxis. Dysphagia #1 honey thick liquid diet. Patient was admitted to inpatient rehab services 04/10/2019 with steady progressive gains. Noted on 04/14/2019 with noted ongoing bouts of hyperkalemia and did not respond as expected to Lasix.. Nephrology recommended Kayexalate enema and insulin with dextrose. Patient became more confused and it was felt would benefit from discharge to acute care services for ongoing monitoring. EKG  follow-up without peaked T waves hemodynamically stable but remained on  telemetry. Hyperkalemia improving 5.8 and placed on VELTASSA16.8g dailyand patient and family were to be instructed also on a low potassium diet restriction .Follow-up renal services in regards to hyperkalemia acute kidney injury no plan for dialysis at this time.  Patient transferred to CIR on 04/18/2019 .   Patient currently requires max with mobility secondary to muscle weakness, decreased cardiorespiratoy endurance, impaired timing and sequencing, abnormal tone, unbalanced muscle activation, motor apraxia, decreased coordination and decreased motor planning, decreased attention, decreased problem solving and decreased safety awareness and decreased sitting balance, decreased standing balance, decreased postural control, hemiplegia and decreased balance strategies.  Prior to hospitalization, patient was independent  with mobility and lived with Family(lives w/ ex-wife) in a House home.  Home access is 3-4 Stairs to enter.  Patient will benefit from skilled PT intervention to maximize safe functional mobility, minimize fall risk and decrease caregiver burden for planned discharge home with 24 hour assist.  Anticipate patient will benefit from follow up [facility] at discharge.  PT - End of Session Activity Tolerance: Tolerates < 10 min activity, no significant change in vital signs Endurance Deficit: Yes Endurance Deficit Description: fatigues easily, increased work of breathing at baseline w/ all mobility PT Assessment Rehab Potential (ACUTE/IP ONLY): Good PT Barriers to Discharge: Inaccessible home environment;Home environment access/layout PT Barriers to Discharge Comments: 3-4 steps to enter home PT Patient demonstrates impairments in the following area(s): Balance;Endurance;Motor;Perception;Pain;Safety;Sensory PT Transfers Functional Problem(s): Bed Mobility;Bed to Chair;Floor;Furniture;Car PT Locomotion Functional Problem(s):  Ambulation;Stairs;Wheelchair Mobility PT Plan PT Intensity: Minimum of 1-2 x/day ,45 to 90 minutes PT Frequency: 5 out of 7 days PT Duration Estimated Length of Stay: 3-4 weeks PT Treatment/Interventions: Ambulation/gait training;Disease management/prevention;Pain management;Stair training;Patient/family education;DME/adaptive equipment instruction;Balance/vestibular training;Therapeutic Activities;Wheelchair propulsion/positioning;Cognitive remediation/compensation;Functional electrical stimulation;Psychosocial support;Therapeutic Exercise;Functional mobility training;Community reintegration;Skin care/wound management;UE/LE Strength taining/ROM;Discharge planning;Neuromuscular re-education;Splinting/orthotics;UE/LE Coordination activities;Visual/perceptual remediation/compensation PT Transfers Anticipated Outcome(s): min assist PT Locomotion Anticipated Outcome(s): mod assist short distance gait PT Recommendation Follow Up Recommendations: Home health PT Patient destination: Home Equipment Recommended: To be determined  Skilled Therapeutic Intervention  Pt in supine and agreeable to therapy, pain as detailed below. Pt w/ expressive aphasia, unable to communicate more than head nods and the occasional yes/no this session. Pt attempting to communicate more words, however easily frustrated with himself. Yes/no's seemingly reliable as he confirmed information in his chart. Switched w/c to larger chair for pt's body habitus. Performed bed mobility w/ min-mod assist overall and maintained static sitting at EOB w/ close supervision. Stedy transfer to w/c, mod assist to stand in stedy. Total assist w/c transport to/from therapy gym. Attempted self-propulsion, however pt unable to coordinate L hemi technique. Sit<>stands at rail w/ max assist. Ambulated 2' (3-4 steps) w/ max assist overall, 2nd helper providing w/c follow. Mod-max assist for R foot placement and swing limb advancement, needed no assist to  maintain quad contraction in single leg stance, although therapist ready to prevent R knee buckle for safety. Practiced squat pivot transfers to/from mat w/ max assist, pt never fully living bottom off chair/mat despite max cues and assist to boost into standing. Performed x2 reps. Returned to room total assist and ended session in w/c, all needs in reach.   Instructed pt in results of PT evaluation as detailed below, PT POC, rehab potential, rehab goals, and discharge recommendations. Additionally discussed CIR's policies regarding fall safety and use of chair alarm and/or quick release belt. Pt verbalized understanding and in agreement. Ended session in w/c, all needs in reach.  PT Evaluation Precautions/Restrictions Precautions  Precautions: Fall Precaution Comments: Expressive aphasia, R hemiparesis Restrictions Weight Bearing Restrictions: No Pain Pain Assessment Pain Scale: Faces Faces Pain Scale: No hurt Pain Location: Leg Pain Orientation: Right Pain Descriptors / Indicators: Other (Comment)(unable to state 2/2 aphasia, pt pointing to knee when asked if he had any pain) Pain Onset: Unable to tell Home Living/Prior Functioning Home Living Available Help at Discharge: Family(ex-wife and son) Type of Home: House Home Access: Stairs to enter [employer] of Steps: 3-4  Entrance Stairs-Rails: Can reach both Home Layout: One level Bathroom Shower/Tub: Chiropodist: Standard Additional Comments: home set-up information taken from chart review, pt confirmed everything via head nods  Lives With: Family(lives w/ ex-wife) Prior Function Level of Independence: Independent with homemaking with ambulation;Independent with gait;Independent with basic ADLs;Independent with transfers  Able to Take Stairs?: Yes Driving: Yes Vocation: Retired Comments: PLOF taken from chart review, pt confirmed all information w/ head nods Vision/Perception  Vision -  Assessment Eye Alignment: Within Functional Limits Ocular Range of Motion: Within Functional Limits Tracking/Visual Pursuits: Requires cues, head turns, or add eye shifts to track Saccades: Overshoots Additional Comments: Pt following simple commands consistently, however formal visual assessment limited 2/2 apraxia Perception Perception: Within Functional Limits Praxis Praxis: Impaired Praxis Impairment Details: Motor planning  Cognition Orientation Level: Oriented to person(difficult to assess due to aphasia) Sensation Sensation Light Touch: Impaired by gross assessment(diminished RLE) Coordination Gross Motor Movements are Fluid and Coordinated: No Heel Shin Test: impaired R 2/2 weakness Motor  Motor Motor: Hemiplegia;Abnormal tone Motor - Skilled Clinical Observations: R hemi, increased R tone UE>LE  Mobility Bed Mobility Bed Mobility: Rolling Right;Rolling Left;Supine to Sit;Sit to Supine Rolling Right: Minimal Assistance - Patient > 75% Rolling Left: Minimal Assistance - Patient > 75% Supine to Sit: Moderate Assistance - Patient 50-74% Sit to Supine: Moderate Assistance - Patient 50-74% Transfers Transfers: Sit to Stand;Stand to Sit;Squat Pivot Transfers;Transfer via Geophysicist/field seismologist Sit to Stand: Maximal Assistance - Patient 25-49% Stand to Sit: Maximal Assistance - Patient 25-49% Squat Pivot Transfers: Maximal Assistance - Patient 25-49% Transfer (Assistive device): None Transfer via Lift Equipment: Stedy(mod assist sit<>stand in stedy, performed both stedy transfer and squat pivot transfer) Locomotion  Gait Ambulation: Yes Gait Assistance: 2 Helpers Gait Distance (Feet): 2 Feet Assistive device: Other (Comment)(rail in hallway) Gait Assistance Details: Manual facilitation for placement;Manual facilitation for weight shifting;Manual facilitation for weight bearing;Tactile cues for posture;Verbal cues for technique;Verbal cues for sequencing;Verbal cues for  precautions/safety Gait Gait: Yes Gait Pattern: Impaired Gait Pattern: Decreased dorsiflexion - right;Narrow base of support;Trunk flexed;Poor foot clearance - right Gait velocity: decreased Stairs / Additional Locomotion Stairs: No Wheelchair Mobility Wheelchair Mobility: No(attempted but unable to coordinate)  Trunk/Postural Assessment  Cervical Assessment Cervical Assessment: Within Functional Limits Thoracic Assessment Thoracic Assessment: Exceptions to WFL(rounded shoulders) Lumbar Assessment Lumbar Assessment: Exceptions to WFL(posterior pelvic tilt) Postural Control Postural Control: Deficits on evaluation(posterior lean bias)  Balance Balance Balance Assessed: Yes Static Sitting Balance Static Sitting - Balance Support: Bilateral upper extremity supported;Feet supported Static Sitting - Level of Assistance: 5: Stand by assistance Dynamic Sitting Balance Dynamic Sitting - Balance Support: Feet supported;Left upper extremity supported Dynamic Sitting - Level of Assistance: 4: Min assist Static Standing Balance Static Standing - Balance Support: Left upper extremity supported;During functional activity Static Standing - Level of Assistance: 3: Mod assist Dynamic Standing Balance Dynamic Standing - Balance Support: Left upper extremity supported;During functional activity Dynamic Standing - Level of Assistance: 2: Max assist Extremity Assessment  RLE Assessment  RLE Assessment: Exceptions to [facility] RLE Strength Right Hip Flexion: 3-/5 Right Hip Extension: 3-/5 Right Knee Flexion: 4/5 Right Knee Extension: 3-/5 Right Ankle Dorsiflexion: 3+/5 Right Ankle Plantar Flexion: 3/5 LLE Assessment LLE Assessment: Within Functional Limits    Refer to Care Plan for Long Term Goals  Recommendations for other services: None   Discharge Criteria: Patient will be discharged from PT if patient refuses treatment 3 consecutive times without medical reason, if treatment goals not  met, if there is a change in medical status, if patient makes no progress towards goals or if patient is discharged from hospital.  The above assessment, treatment plan, treatment alternatives and goals were discussed and mutually agreed upon: by patient  Ghazal Pevey K Susann [place] 04/19/2019, 9:00 AM

## 2019-04-19 NOTE — Care Management Note (Signed)
[facility]patient [facility] Individual Statement of Services  Patient Name:  Gregory [name]  Date:  04/19/2019  Welcome to the [facility].  Our goal is to provide you with an individualized program based on your diagnosis and situation, designed to meet your specific needs.  With this comprehensive rehabilitation program, you will be expected to participate in at least 3 hours of rehabilitation therapies Monday-Friday, with modified therapy programming on the weekends.  Your rehabilitation program will include the following services:  Physical Therapy (PT), Occupational Therapy (OT), Speech Therapy (ST), 24 hour per day rehabilitation nursing, Therapeutic Recreaction (TR), Neuropsychology, Case Management (Social Worker), Rehabilitation Medicine, Nutrition Services and Pharmacy Services  Weekly team conferences will be held on Wednesday to discuss your progress.  Your Social Worker will talk with you frequently to get your input and to update you on team discussions.  Team conferences with you and your family in attendance may also be held.  Expected length of stay: 3-4 weeks  Overall anticipated outcome: min assist overall level  Depending on your progress and recovery, your program may change. Your Social Worker will coordinate services and will keep you informed of any changes. Your Social Worker's name and contact numbers are listed  below.  The following services may also be recommended but are not provided by the [facility]:   Driving Evaluations  Home Health Rehabiltiation Services  Outpatient Rehabilitation Services   Arrangements will be made to provide these services after discharge if needed.  Arrangements include referral to agencies that provide these services.  Your insurance has been verified to be:  [employer] Your primary doctor is:  [facility]  Pertinent information will be shared with your doctor and your  insurance company.  Social Worker:  Dossie Der, SW [phone] or [phone]  Information discussed with and copy given to patient by: Lucy Chris, 04/19/2019, 9:33 AM

## 2019-04-19 NOTE — Progress Notes (Signed)
Called for K of 6.6- crt stable in the high 3's- earlier today veltassa was increased to max daily dose which he got at 1300.  Vital signs are stable - HR 97  Will give one dose calcium gluconate, also give high dose lasix IV times one since making urine and change fluid to sodium bicarb based.  Have ordered repeat K at 10 PM  Cecille Aver

## 2019-04-19 NOTE — Progress Notes (Signed)
Patient ID: Gregory [name], male   DOB: June 08, 1951, 68 y.o.   MRN: [id number] [facility] Progress Note   Assessment/ Plan:   #  slowly resolving Acute kidney injury on chronic kidney disease stage III:  With bladder outlet obstruction/obstructive uropathy.  It is highly likely that he has had this obstruction for >2 weeks and may have significant residual reduced GFR - leave indwelling Foley until seen by urology as outpatient - Nonoliguric; plateau SCr of around 4 - Continue IV fluids - reduce D5 1/2NS to 3850mL/h - CT abdomen/pelvis 4/26 w/ decompression of bladder, foley in place, no HN  # Hyperkalemia   - increase Veltassa to 25.2gm; add metolazone 5mg  qAM to inc ureinary K excretion.   - Cont dietary K restriction - Recovering GFR will help  # Hypernatremia: resolved  # Hypertension: acceptable   # Anemia: stable; CTM   # Elevated troponin levels: Trending down last eval  # Acute CVA with right-sided weakness/expressive aphasia: in CIR  Subjective:    K 6.0 this AM. SCr slight improvement.  Adequate UOP  No interval changes  Tol PO   Objective:   BP (!) 144/102 (BP Location: Left Arm)   Pulse 97   Temp (!) 97.5 F (36.4 C) (Oral)   Resp 16   Ht 5\' 4"  (1.626 m)   Wt 83.2 kg   SpO2 98%   BMI 31.49 kg/m   Intake/Output Summary (Last 24 hours) at 04/19/2019 1528 Last data filed at 04/19/2019 1300 Gross per 24 hour  Intake 320 ml  Output 1100 ml  Net -780 ml   Weight change:   Physical Exam:  Gen: elderly male in bed in NAD, smiling  CVS: RRR; S1, S2 Resp: clear to auscultation bilaterally and normal work of breathing  Abd: obese habitus; softly distended; nontender.   Ext: no lower extremity edema    Neuro: Aphasia; awake and interactive.  Nods and shakes head but mostly nonverbal  GU foley intact and draining  Imaging: No results found.  Labs: BMET Recent Labs  Lab 04/13/19 0517  04/14/19 0725  04/15/19 0549 04/15/19 1344   04/16/19 0341 04/16/19 0901 04/16/19 1200 04/17/19 0449 04/17/19 1300 04/18/19 0251 04/19/19 0532  NA 144  --  143   < > 141 140  --  143  --  142 143  --  140 138  K 6.0*   < > 5.7*   < > 6.0* 6.0*   < > 6.0* 6.0* 5.6* 5.5* 6.0* 5.8* 6.0*  CL 106  --  103   < > 103 100  --  104  --  105 104  --  104 107  CO2 26  --  26   < > 23 25  --  27  --  23 26  --  24 22  GLUCOSE 95  --  94   < > 94 118*  --  104*  --  108* 95  --  96 93  BUN 86*  --  80*   < > 82* 85*  --  83*  --  84* 80*  --  75* 68*  CREATININE 4.34*  --  3.94*   < > 4.02* 4.33*  --  4.34*  --  4.24* 4.08*  --  4.01* 3.78*  CALCIUM 8.5*  --  9.3   < > 9.5 9.6  --  9.2  --  9.4 8.8*  --  9.0 9.3  PHOS 5.4*  --  5.4*  --   --   --   --   --   --   --   --   --   --   --    < > = values in this interval not displayed.   CBC Recent Labs  Lab 04/15/19 0549 04/16/19 0341 04/17/19 0449 04/18/19 0251 04/19/19 0532  WBC 7.9 9.5 8.1 9.1 10.1  NEUTROABS 4.5  --   --   --  6.9  HGB 10.3* 10.0* 9.1* 9.1* 9.5*  HCT 33.7* 33.2* 30.8* 30.5* 31.6*  MCV 94.9 95.4 95.1 94.4 93.2  PLT 190 220 219 230 233    Medications:    . aspirin  300 mg Rectal Daily   Or  . aspirin  325 mg Oral Daily  . atorvastatin  80 mg Oral q1800  . clopidogrel  75 mg Oral Daily  . heparin  5,000 Units Subcutaneous Q8H  . metolazone  5 mg Oral Daily  . metoprolol tartrate  25 mg Oral BID  . patiromer  25.2 g Oral Daily   Arita Miss 04/19/2019

## 2019-04-20 ENCOUNTER — Inpatient Hospital Stay (HOSPITAL_COMMUNITY): Payer: Medicare HMO | Admitting: Occupational Therapy

## 2019-04-20 ENCOUNTER — Inpatient Hospital Stay (HOSPITAL_COMMUNITY): Payer: Medicare HMO

## 2019-04-20 ENCOUNTER — Encounter (HOSPITAL_COMMUNITY): Payer: Medicare HMO | Admitting: Speech Pathology

## 2019-04-20 ENCOUNTER — Inpatient Hospital Stay (HOSPITAL_COMMUNITY): Payer: Self-pay | Admitting: Physical Therapy

## 2019-04-20 ENCOUNTER — Ambulatory Visit (HOSPITAL_COMMUNITY): Payer: Medicare HMO | Admitting: Speech Pathology

## 2019-04-20 LAB — BASIC METABOLIC PANEL
Anion gap: 17 — ABNORMAL HIGH (ref 5–15)
Anion gap: 14 (ref 5–15)
BUN: 72 mg/dL — ABNORMAL HIGH (ref 8–23)
BUN: 75 mg/dL — ABNORMAL HIGH (ref 8–23)
CO2: 24 mmol/L (ref 22–32)
CO2: 25 mmol/L (ref 22–32)
Calcium: 10 mg/dL (ref 8.9–10.3)
Calcium: 9.2 mg/dL (ref 8.9–10.3)
Chloride: 97 mmol/L — ABNORMAL LOW (ref 98–111)
Chloride: 97 mmol/L — ABNORMAL LOW (ref 98–111)
Creatinine, Ser: 4.08 mg/dL — ABNORMAL HIGH (ref 0.61–1.24)
Creatinine, Ser: 4.29 mg/dL — ABNORMAL HIGH (ref 0.61–1.24)
GFR calc Af Amer: 16 mL/min — ABNORMAL LOW (ref >60)
GFR calc Af Amer: 15 mL/min — ABNORMAL LOW (ref >60)
GFR calc non Af Amer: 14 mL/min — ABNORMAL LOW (ref >60)
GFR calc non Af Amer: 13 mL/min — ABNORMAL LOW (ref >60)
Glucose, Bld: 111 mg/dL — ABNORMAL HIGH (ref 70–99)
Glucose, Bld: 139 mg/dL — ABNORMAL HIGH (ref 70–99)
Potassium: 5.7 mmol/L — ABNORMAL HIGH (ref 3.5–5.1)
Potassium: 5.1 mmol/L (ref 3.5–5.1)
Sodium: 138 mmol/L (ref 135–145)
Sodium: 136 mmol/L (ref 135–145)

## 2019-04-20 LAB — POTASSIUM: Potassium: 5.8 mmol/L — ABNORMAL HIGH (ref 3.5–5.1)

## 2019-04-20 MED ORDER — BLOOD PRESSURE CONTROL BOOK: Freq: Once | Status: DC

## 2019-04-20 NOTE — Progress Notes (Signed)
Speech Language Pathology Daily Session Note  Patient Details  Name: Gregory [name] MRN: [id number] Date of Birth: 1951/03/10  Today's Date: 04/20/2019 SLP Individual Time: 0630-1601 SLP Individual Time Calculation (min): 20 min  Short Term Goals: Week 1: SLP Short Term Goal 1 (Week 1): Pt will tolerate PO diet (TBD after this week's MBS) without overt s/s aspiration given min A verbal and visual cues for use of safe swallow strategies. SLP Short Term Goal 2 (Week 1): Pt will follow two-step verbal directions with 80% accuracy. SLP Short Term Goal 3 (Week 1): Pt will successfully communicate name of object (blind to clinician) through use of augmentative communication resources on 7/10 trials.  SLP Short Term Goal 4 (Week 1): pt will produce 3-5 vowel sounds and or C-V combinations with 50% accuracy given max verbal/visual cues.  Skilled Therapeutic Interventions:  Skilled treatment session #1 focused on communication goals. SLP facilitated session by providing Min A cues to follow 1 step directions as will as reliably answer yes/no question via gestures. Pt with good overall comprehend of transfer task as well as indicating wants/needs by yes/no.   Skilled treatment session #2 focused on completion of MBS. See report.   Skilled treatment session #3 focused on dysphagia and communication goals. SLP facilitiated session by providing skilled observation of pt consuming dysphagia 1 lunch tray with thin liquids via cup sips. Pt consumed with mild right anterior spillage of puree but no anterior spillage of thin liquids. Pt with intermittent cough that was not related to PO intake. Continue dysphagia 1 with thin liquids, medicine crushed in puree and full nursing supervision. Education provided to nursing as well as posted in room.      Pain    Therapy/Group: Individual Therapy  [name] 04/20/2019, 3:55 PM

## 2019-04-20 NOTE — Progress Notes (Signed)
Occupational Therapy Session Note  Patient Details  Name: Gregory [name] MRN: [id number] Date of Birth: 12-05-1951  Today's Date: 04/20/2019 OT Individual Time: 1004-1101 OT Individual Time Calculation (min): 57 min    Short Term Goals: Week 1:  OT Short Term Goal 1 (Week 1): Pt will complete LB dressing with max assist of one caregiver OT Short Term Goal 2 (Week 1): Pt will complete bathing with mod assist to demonstrate increased initiation and sequencing OT Short Term Goal 3 (Week 1): Pt will complete toilet transfer with mod assist. OT Short Term Goal 4 (Week 1): Pt will complete UB dressing with mod assist  Skilled Therapeutic Interventions/Progress Updates:     Session 1 (1004-1101)  Pt began session working on eating his breakfast from the wheelchair secondary to not having a chance this am.  He was setup for use of the LUE to complete task including drinking from a cup.  Mod instructional cueing for slowing down and taking smaller bites.  Next pt indicated that he wanted to shave at the sink.  Supplies were brought to the room by therapist for task to be worked on.  Max hand over hand assistance needed to have pt hold the shaving cream with the right hand and squeeze the top for foam to come out.  He was able to apply is with min instructional cueing for thoroughness.  Pt then used the LUE to begin shaving where he completed 15% of task before therapist had to assist with max facilitation secondary to using a safety razor and not an [employer] razor.  Once shaving was completed, pt then worked on UB bathing with min assist overall in supported sitting and max hand over hand assist to wash the LUE using the RUE.  Noted increased internal rotation tone as well as biceps tone in the RUE with attempted functional use.  He completed sit to stand X 2 with overall total assist in order for therapist to assist with washing peri area and for donning new brief over his hips.  Increased lean and LOB  noted to the right when working on grooming and bathing tasks with upright sitting.  Pt unable to correct without mod assist overall.  Finished session with call button and phone in reach and pt wearing a new gown.  Scrubs not donned secondary to pt having running IV at this time.  Safety alarm belt in place as well.    Session 2 (1404-1430)  Pt without pain reported in the bed in supine.  Worked on neuromuscular re-education for the RUE in supine session.  He was able to complete AAROM exercises for the shoulder, elbow, and digits.  Increased flexor tone is noted in the elbow with pt demonstrating some active movement of elbow flexion with only trace elbow extension.  Shoulder flexion with mod facilitation to 90 degrees in supine position.  Gross digit flexion and extension are less than 10%.  Pt completed simulated functional reach to target from supine with HOB elevated with and mod assist from therapist.  He completed 2 sets of 10 reps for elbow extension with RUE propped on pillow and max facilitation.  Gross digit flexion and extension completed for 1 set of 10 reps with max facilitation as well. Pt left in the bed at end of session with call button and phone in reach and bed alarm in place.     Therapy Documentation Precautions:  Precautions Precautions: Fall Precaution Comments: Expressive aphasia, R hemiparesis Restrictions Weight Bearing Restrictions:  No   Pain: Pain Assessment Pain Scale: Faces Pain Score: 0-No pain ADL: See Care Tool Section for some details of ADL  Therapy/Group: Individual Therapy  Latoiya Maradiaga OTR/L 04/20/2019, 12:50 PM

## 2019-04-20 NOTE — Progress Notes (Signed)
Physical Therapy Session Note  Patient Details  Name: Gregory [name] MRN: [id number] Date of Birth: October 22, 1951  Today's Date: 04/20/2019 PT Individual Time: 0801-0901 PT Individual Time Calculation (min): 60 min   Short Term Goals: Week 1:  PT Short Term Goal 1 (Week 1): Pt will perform bed<>chair transfer with mod assist PT Short Term Goal 2 (Week 1): Pt will ambulate 10' w/ max assist w/ LRAD PT Short Term Goal 3 (Week 1): Pt will perform bed mobility with min assist  Skilled Therapeutic Interventions/Progress Updates:    Pt received asleep, supine in bed and easily awakens to verbal stimulus. Pt performed supine to sit, HOB partially elevated and using bedrails, with mod assist for trunk upright. Pt able to sit on EOB with L UE support and close supervision but without UE support demonstrates R lateral lean/LOB requiring assist to maintain upright. Pt performed sit<>stand in stedy with mod assist for lifting and to prevent R lateral trunk lean x 8 throughout session - cuing for increased trunk extension to achieve upright because pt demonstrates increased anterior trunk lean - pt has onset of R LE flexor tone coming to standing requiring assist for proper foot placement to allow weightbearing during stance. Pt performed sitting balance task in high perched position in stedy focusing on midline orientation and increased trunk upright with mirror for visual feedback. Therapist performed R UE PROM focusing on elbow extension due to noted continuous resting position in elbow flexion with sustained stretch for ~30seconds x3 into extension. Pt performed sitting balance on EOB focusing on no UE support and increased L lateral weight shift with manual facilitation and multimodal cuing for midline orientation due to R lateral lean/LOB with varying from supervision to intermittent mod assist to prevent R LOB. Performed repeated R lateral lean onto R UE forearm support with focus on R UE weightbearing and  trunk control to return to midline with manual facilitation for R UE elbow extension and L lateral weight shift with mod assist to return to midline. Performed L UE reaching task focusing on L lateral weight shift to grasp object. Attempted seated EOB R LE knee extension with pt demonstrating onset of flexor tone upon attempts at movement with hamstring activation - therapist performed passive knee extension stretch in sitting with pt demonstrating increased fatigue in sitting with posterior lean and returned to supine for rest break with mod assist for trunk and LE management. Performed supine heel slides 1 set of 10 with cuing for technique and intermittent min assist to initiate LE extension. Performed 3x sit<>stand in stedy with mod assist for lifting and to prevent R lateral lean. Transfer in stedy to w/c. Pt left sitting in w/c with needs in reach, lines intact, and seat belt alarm on.  Therapy Documentation Precautions:  Precautions Precautions: Fall Precaution Comments: Expressive aphasia, R hemiparesis Restrictions Weight Bearing Restrictions: No  Pain:   Pt reports R knee pain standing in stedy and with response to yes/no head nodes pt reports his knee has increased sensitivity and there is pain with touch to anterior knee and with weight bearing. Tried placing pillow in stedy but pt reporting increased pain as opposed to decreased pain. RN and PA notified.     Therapy/Group: Individual Therapy  Ginny Forth, PT, DPT 04/20/2019, 7:44 AM

## 2019-04-20 NOTE — Progress Notes (Signed)
PHYSICAL MEDICINE & REHABILITATION PROGRESS NOTE   Subjective/Complaints:  Nephro still managing resistant hyperkalemia Scheduled for swallow study today  ROS- neg CP SOB, N/V/D   Objective:   No results found. Recent Labs    04/18/19 0251 04/19/19 0532  WBC 9.1 10.1  HGB 9.1* 9.5*  HCT 30.5* 31.6*  PLT 230 233   Recent Labs    04/19/19 0532 04/19/19 1633 04/19/19 2137  NA 138 137  --   K 6.0* 6.6* 5.8*  CL 107 105  --   CO2 22 22  --   GLUCOSE 93 99  --   BUN 68* 71*  --   CREATININE 3.78* 3.73*  --   CALCIUM 9.3 9.4  --     Intake/Output Summary (Last 24 hours) at 04/20/2019 0903 Last data filed at 04/20/2019 0540 Gross per 24 hour  Intake 200 ml  Output 2700 ml  Net -2500 ml     Physical Exam: Vital Signs Blood pressure 122/81, pulse 60, temperature 98.2 F (36.8 C), temperature source Oral, resp. rate 16, height 5\' 4"  (1.626 m), weight 83.2 kg, SpO2 98 %.   General: No acute distress Mood and affect are appropriate Heart: Regular rate and rhythm no rubs murmurs or extra sounds Lungs: Clear to auscultation, breathing unlabored, no rales or wheezes Abdomen: Positive bowel sounds, soft nontender to palpation, nondistended Extremities: No clubbing, cyanosis, or edema Skin: No evidence of breakdown, no evidence of rash Neurologic: Cranial nerves II through XII intact, motor strength is 5/5 in Left and 2-5 Right deltoid, bicep, tricep, grip, hip flexor, knee extensors,  Trace Right ankle dorsiflexor and plantar flexor Sensory exam normal sensation to light touch and proprioception in bilateral upper and lower extremities Cerebellar exam normal finger to nose to finger as well as heel to shin in bilateral upper and lower extremities Musculoskeletal: Full range of motion in all 4 extremities. No joint swelling   Assessment/Plan: 1. 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  Physiatrist is providing close team supervision and 24 hour management of active medical problems listed below.  Physiatrist and rehab team continue to assess barriers to discharge/monitor patient progress toward functional and medical goals  Care Tool:  Bathing    Body parts bathed by patient: Right arm, Chest, Abdomen, Right upper leg, Left upper leg, Face, Buttocks, Left arm   Body parts bathed by helper: Right lower leg, Front perineal area, Left lower leg     Bathing assist Assist Level: Maximal Assistance - Patient 24 - 49%     Upper Body Dressing/Undressing Upper body dressing   What is the patient wearing?: Hospital gown only    Upper body assist Assist Level: Total Assistance - Patient < 25%    Lower Body Dressing/Undressing Lower body dressing      What is the patient wearing?: Incontinence brief     Lower body assist Assist for lower body dressing: Total Assistance - Patient < 25%     Toileting Toileting    Toileting assist Assist for toileting: Dependent - Patient 0%     Transfers Chair/bed transfer  Transfers assist     Chair/bed transfer assist level: 2 Helpers     Locomotion Ambulation   Ambulation assist   Ambulation activity did not occur: Safety/medical concerns          Walk 10 feet activity  Assist  Walk 10 feet activity did not occur: Safety/medical concerns        Walk 50 feet activity   Assist Walk 50 feet with 2 turns activity did not occur: Safety/medical concerns         Walk 150 feet activity   Assist Walk 150 feet activity did not occur: Safety/medical concerns         Walk 10 feet on uneven surface  activity   Assist Walk 10 feet on uneven surfaces activity did not occur: Safety/medical concerns         Wheelchair     Assist Will patient use wheelchair at discharge?: Yes Type of Wheelchair: Manual     Wheelchair assist level: Dependent - Patient 0% Max wheelchair distance: 150 ft    Wheelchair 50 feet with 2 turns activity    Assist        Assist Level: Dependent - Patient 0%   Wheelchair 150 feet activity     Assist     Assist Level: Dependent - Patient 0%    Medical Problem List and Plan: 1.Right hemiparesis and aphasia/dysphagiasecondary to left insular and posterior frontal infarct embolic secondary to non-STEMI. Status post loop recorder -CIR PT, OT, SLP  2. Antithrombotics: -DVT/anticoagulation:Subcutaneous heparin. -antiplatelet therapy: Aspirin 325 mg daily, Plavix 75 mg daily 3. Pain Management:Tylenol as needed 4. Mood:Provide emotional support -antipsychotic agents: N/A 5. Neuropsych: This patientiscapable of making decisions on hisown behalf. 6. Skin/Wound Care:Routine skin checks 7. Fluids/Electrolytes/Nutrition:Routine in and outs with follow-up chemistries HyperK increase Veltassa per nephrology, ? Need for kayexalate defer to nephro 8.Dysphagia. Dysphagia #1 honey thick liquids. Monitor hydration/labs. Monitor for aspiration, also needs IVF for hydration  9.Acute on chronic kidney disease stage III/hyperkalemia with obstructive uropathy/BPH. Follow-up urology services as well as nephrology services.  -FOLEY TUBE TO REMAIN IN PLACE UNTIL FOLLOW UP OUT PATIENT WITH UROLOGY 10.Hyperlipidemia. Lipitor 11.Hypertension. Lopressor 25 mg twice daily. Monitor with increased mobility Vitals:   04/19/19 1936 04/20/19 0540  BP: (!) 144/76 122/81  Pulse: 75 60  Resp: 18 16  Temp: 98.3 F (36.8 C) 98.2 F (36.8 C)  SpO2: 99% 98%  Fair control 12.OSA. Continue CPAP    LOS: 2 days A FACE TO FACE EVALUATION WAS PERFORMED  Erick Colace 04/20/2019, 9:03 AM

## 2019-04-20 NOTE — Progress Notes (Signed)
Patient ID: Gregory [name], male   DOB: 11/18/1951, 68 y.o.   MRN: [id number] [facility] Progress Note   Assessment/ Plan:   #  slowly resolving Acute kidney injury on chronic kidney disease stage III:  With bladder outlet obstruction/obstructive uropathy.  It is highly likely that he has had this obstruction for >2 weeks and may have significant residual reduced GFR - leave indwelling Foley until seen by urology as outpatient - Nonoliguric; plateau SCr of around 4 - Continue IV fluids - reduce D5 1/2NS to 58mL/h - CT abdomen/pelvis 4/26 w/ decompression of bladder, foley in place, no HN  # Hyperkalemia   - Cont Veltassa to 25.2gm; metolazone 5mg  qAM to inc ureinary K excretion.   - Might consider changing to NaHCO3 by PO based on above  - Cont dietary K restriction - Recovering GFR will help -- BID BMD  # Hypernatremia: resolved  # Hypertension: acceptable   # Anemia: stable; CTM   # Elevated troponin levels: Trending down last eval  # Acute CVA with right-sided weakness/expressive aphasia: in CIR  Subjective:    K inc to 6.6 overnight.  Given Max Veltassa, got Ca Gluc and lasix.  Repeat improved into 50s  57 this AM, HCO3 24, on NaHCO3 gtt   Objective:   BP 122/81 (BP Location: Right Arm)   Pulse 60   Temp 98.2 F (36.8 C) (Oral)   Resp 16   Ht 5\' 4"  (1.626 m)   Wt 83.2 kg   SpO2 98%   BMI 31.49 kg/m   Intake/Output Summary (Last 24 hours) at 04/20/2019 1257 Last data filed at 04/20/2019 0540 Gross per 24 hour  Intake 200 ml  Output 2300 ml  Net -2100 ml   Weight change:   Physical Exam:  Gen: elderly male in bed in NAD, smiling  CVS: RRR; S1, S2 Resp: clear to auscultation bilaterally and normal work of breathing  Abd: obese habitus; softly distended; nontender.   Ext: no lower extremity edema    Neuro: Aphasia; awake and interactive.  Nods and shakes head but mostly nonverbal  GU foley intact and draining  Imaging: No results  found.  Labs: BMET Recent Labs  Lab 04/14/19 0725  04/16/19 0341  04/16/19 1200 04/17/19 0449 04/17/19 1300 04/18/19 0251 04/19/19 0532 04/19/19 1633 04/19/19 2137 04/20/19 1142  NA 143   < > 143  --  142 143  --  140 138 137  --  138  K 5.7*   < > 6.0*   < > 5.6* 5.5* 6.0* 5.8* 6.0* 6.6* 5.8* 5.7*  CL 103   < > 104  --  105 104  --  104 107 105  --  97*  CO2 26   < > 27  --  23 26  --  24 22 22   --  24  GLUCOSE 94   < > 104*  --  108* 95  --  96 93 99  --  111*  BUN 80*   < > 83*  --  84* 80*  --  75* 68* 71*  --  72*  CREATININE 3.94*   < > 4.34*  --  4.24* 4.08*  --  4.01* 3.78* 3.73*  --  4.08*  CALCIUM 9.3   < > 9.2  --  9.4 8.8*  --  9.0 9.3 9.4  --  10.0  PHOS 5.4*  --   --   --   --   --   --   --   --   --   --   --    < > =  values in this interval not displayed.   CBC Recent Labs  Lab 04/15/19 0549 04/16/19 0341 04/17/19 0449 04/18/19 0251 04/19/19 0532  WBC 7.9 9.5 8.1 9.1 10.1  NEUTROABS 4.5  --   --   --  6.9  HGB 10.3* 10.0* 9.1* 9.1* 9.5*  HCT 33.7* 33.2* 30.8* 30.5* 31.6*  MCV 94.9 95.4 95.1 94.4 93.2  PLT 190 220 219 230 233    Medications:    . aspirin  300 mg Rectal Daily   Or  . aspirin  325 mg Oral Daily  . atorvastatin  80 mg Oral q1800  . blood pressure control book   Does not apply Once  . clopidogrel  75 mg Oral Daily  . heparin  5,000 Units Subcutaneous Q8H  . metolazone  5 mg Oral Daily  . metoprolol tartrate  25 mg Oral BID  . patiromer  25.2 g Oral Daily   Arita MissRyan B Journe Hallmark 04/20/2019

## 2019-04-20 NOTE — Progress Notes (Signed)
Modified Barium Swallow Progress Note  Patient Details  Name: [place] Shriver MRN: [id number] Date of Birth: [birth year]  Today's Date: 04/20/2019  Modified Barium Swallow completed.  Full report located under Chart Review in the Imaging Section.  Brief recommendations include the following:  Clinical Impression  Pt presents with severe oral phase deficits c/b decreased difficulty lingually manipulating bolus with cyclical tongue pattern. This inability to manipulate bolus results significantly decreased bolus cohesion,  difficulty propelling bolus posteriorally, premature spillage and pieacemeal swallowing of all boluses. With soft solid and pill whole in puree, pt was unable to clear oral residue with pill being removed by SLP.  Although pt's swallow initiation occurs at the vallucula for nectar thick liquids and pyriform sinuses for thin liquids, no evidence of aspiraiton was observed when consuming either liquid. Pt was able to achieve good epiglottic deflection with only one instance of penetration. 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 Would recommend dysphagia 1 with thin liquids via cup sips, medicine crushed in puree with full nursing supervision.    Swallow Evaluation Recommendations       SLP Diet Recommendations: Dysphagia 1 (Puree) solids;Thin liquid   Liquid Administration via: Cup   Medication Administration: Crushed with puree   Supervision: Patient able to self feed;Full supervision/cueing for compensatory strategies   Compensations: Minimize environmental distractions;Slow rate;Small sips/bites;Lingual sweep for clearance of pocketing   Postural Changes: Seated upright at 90 degrees   Oral Care Recommendations: Oral care BID        Natalie Mceuen 04/20/2019,3:54 PM

## 2019-04-21 ENCOUNTER — Inpatient Hospital Stay (HOSPITAL_COMMUNITY): Payer: Medicare HMO | Admitting: Occupational Therapy

## 2019-04-21 ENCOUNTER — Inpatient Hospital Stay (HOSPITAL_COMMUNITY): Payer: Medicare HMO | Admitting: Physical Therapy

## 2019-04-21 ENCOUNTER — Inpatient Hospital Stay (HOSPITAL_COMMUNITY): Payer: Medicare HMO | Admitting: Speech Pathology

## 2019-04-21 LAB — BASIC METABOLIC PANEL
Anion gap: 13 (ref 5–15)
Anion gap: 18 — ABNORMAL HIGH (ref 5–15)
BUN: 76 mg/dL — ABNORMAL HIGH (ref 8–23)
BUN: 79 mg/dL — ABNORMAL HIGH (ref 8–23)
CO2: 30 mmol/L (ref 22–32)
CO2: 29 mmol/L (ref 22–32)
Calcium: 9.1 mg/dL (ref 8.9–10.3)
Calcium: 9.3 mg/dL (ref 8.9–10.3)
Chloride: 95 mmol/L — ABNORMAL LOW (ref 98–111)
Chloride: 90 mmol/L — ABNORMAL LOW (ref 98–111)
Creatinine, Ser: 4.32 mg/dL — ABNORMAL HIGH (ref 0.61–1.24)
Creatinine, Ser: 4.55 mg/dL — ABNORMAL HIGH (ref 0.61–1.24)
GFR calc Af Amer: 15 mL/min — ABNORMAL LOW (ref >60)
GFR calc Af Amer: 14 mL/min — ABNORMAL LOW (ref >60)
GFR calc non Af Amer: 13 mL/min — ABNORMAL LOW (ref >60)
GFR calc non Af Amer: 12 mL/min — ABNORMAL LOW (ref >60)
Glucose, Bld: 99 mg/dL (ref 70–99)
Glucose, Bld: 101 mg/dL — ABNORMAL HIGH (ref 70–99)
Potassium: 5 mmol/L (ref 3.5–5.1)
Potassium: 5.5 mmol/L — ABNORMAL HIGH (ref 3.5–5.1)
Sodium: 138 mmol/L (ref 135–145)
Sodium: 137 mmol/L (ref 135–145)

## 2019-04-21 MED ORDER — METOLAZONE 2.5 MG PO TABS
2.5000 mg | ORAL_TABLET | Freq: Every day | ORAL | Status: DC
  Administered 2019-04-22: 08:00:00 2.5 mg via ORAL
  Filled 2019-04-21: qty 1

## 2019-04-21 MED ORDER — SODIUM BICARBONATE 650 MG PO TABS
650.0000 mg | ORAL_TABLET | Freq: Three times a day (TID) | ORAL | Status: DC
  Administered 2019-04-21 – 2019-04-24 (×9): 650 mg via ORAL
  Filled 2019-04-21 (×12): qty 1

## 2019-04-21 NOTE — Progress Notes (Signed)
Occupational Therapy Session Note  Patient Details  Name: Gregory [name] MRN: [id number] Date of Birth: [birth year]  Today's Date: 04/21/2019 OT Individual Time: 6659-9357 OT Individual Time Calculation (min): 53 min    Short Term Goals: Week 1:  OT Short Term Goal 1 (Week 1): Pt will complete LB dressing with max assist of one caregiver OT Short Term Goal 2 (Week 1): Pt will complete bathing with mod assist to demonstrate increased initiation and sequencing OT Short Term Goal 3 (Week 1): Pt will complete toilet transfer with mod assist. OT Short Term Goal 4 (Week 1): Pt will complete UB dressing with mod assist  Skilled Therapeutic Interventions/Progress Updates:    Pt completed bathing and dressing during session sit to stand at the sink.  Mod assist for supine to sit EOB with max assist for stand pivot transfer to the right.  He continues to exhibit increased trunk flexion in standing during sit to stand and transfers.  Max assist for all LB selfcare sit to stand with min assist for UB bathing and mod assist for UB dressing.  Increased right lean and LOB posteriorly when attempting to sit unsupported EOC while bathing.  Max hand over hand assist for washing the RUE with the left hand as well as for holding the soap when removing the cap.  Decreased flexibility noted when attempting to reach his feet for doffing or donning gripper socks.  May try AE and see how he does with a reacher.  Finished session with transfer back to the bed with max assist squat pivot to the right.  Pt left with the LUE supported on pillows and call button and phone in reach.    Therapy Documentation Precautions:  Precautions Precautions: Fall Precaution Comments: Expressive aphasia, R hemiparesis Restrictions Weight Bearing Restrictions: No   Pain: Pain Assessment Pain Score: 0-No pain ADL: See Care Tool Section for details of ADL  Therapy/Group: Individual Therapy  Nole Robey OTR/L 04/21/2019, 3:54  PM

## 2019-04-21 NOTE — Progress Notes (Signed)
Patient ID: Gregory [name], male   DOB: [birth year], 68 y.o.   MRN: [id number] [facility] Progress Note   Assessment/ Plan:   #  slowly resolving Acute kidney injury on chronic kidney disease stage III:  With bladder outlet obstruction/obstructive uropathy.  It is highly likely that he has had this obstruction for >2 weeks and may have significant residual reduced GFR - leave indwelling Foley until seen by urology as outpatient - Nonoliguric; plateau SCr of around 4 - Stop IVFs today, start NaHCO3 PO 650 TID - CT abdomen/pelvis 4/26 w/ decompression of bladder, foley in place, no HN  # Hyperkalemia   - Cont Veltassa to 25.2gm; dec metolazone 2.5mg  qAM to inc ureinary K excretion.   - PO NaHCO3 for now - Cont dietary K restriction  # Hypernatremia: resolved  # Hypertension: acceptable   # Anemia: stable; CTM   # Elevated troponin levels: Trending down last eval  # Acute CVA with right-sided weakness/expressive aphasia: in CIR  Subjective:    K improved to 5.0 on Veltassa 25.2gm, IV NaHCo3 gtt, metolazone  SCr up to 4.3, HCO3 30   Objective:   BP 134/72 (BP Location: Right Arm)   Pulse 71   Temp (!) 97.5 F (36.4 C)   Resp (!) 22   Ht 5\' 4"  (1.626 m)   Wt 81.9 kg   SpO2 97%   BMI 30.99 kg/m   Intake/Output Summary (Last 24 hours) at 04/21/2019 1210 Last data filed at 04/21/2019 0831 Gross per 24 hour  Intake 600 ml  Output 1075 ml  Net -475 ml   Weight change:   Physical Exam:  Gen: elderly male in bed in NAD, smiling  CVS: RRR; S1, S2 Resp: clear to auscultation bilaterally and normal work of breathing  Abd: obese habitus; softly distended; nontender.   Ext: no lower extremity edema    Neuro: Aphasia; awake and interactive.  Nods and shakes head but mostly nonverbal   Imaging: Dg Swallowing Func-speech Pathology  Result Date: 04/20/2019 Objective Swallowing Evaluation: Type of Study: MBS-Modified Barium Swallow Study  Patient Details Name:  Gregory HazyDonald Lee Calixte MRN: [id number] Date of Birth: [birth year] Today's Date: 04/20/2019 Time: SLP Start Time (ACUTE ONLY): 1336 -SLP Stop Time (ACUTE ONLY): 1359 SLP Time Calculation (min) (ACUTE ONLY): 23 min Past Medical History: Past Medical History: Diagnosis Date . AKI (acute kidney injury) (HCC)  . CKD (chronic kidney disease) stage 2, GFR 60-89 ml/min  . CVA (cerebral vascular accident) (HCC)  . Hyperlipidemia  . Hypertension  . PVD (peripheral vascular disease) (HCC)  Past Surgical History: Past Surgical History: Procedure Laterality Date . AORTIC ARCH ANGIOGRAPHY N/A 09/09/2018  Procedure: AORTIC ARCH ANGIOGRAPHY;  Surgeon: Sherren KernsFields, Charles E, MD;  Location: [facility];  Service: Cardiovascular;  Laterality: N/A; . CAROTID ANGIOGRAPHY Bilateral 09/09/2018  Procedure: CAROTID ANGIOGRAPHY;  Surgeon: Sherren KernsFields, Charles E, MD;  Location: [facility];  Service: Cardiovascular;  Laterality: Bilateral; . LOOP RECORDER INSERTION N/A 04/07/2019  Procedure: LOOP RECORDER INSERTION;  Surgeon: Marinus Mawaylor, Gregg W, MD;  [facility];  Service: Cardiovascular;  Laterality: N/A; HPI: Pt is a 68 y.o. male with medical history significant of chronic kidney disease stage III, hypertension, hyperlipidemia, who presented to the hospital with chief complaint of sudden onset right-sided weakness and inability to talk.  MRI of the brain revealed 3.5 cm region of acute infarction affecting the left insula and posterior frontal region. Patient was admitted to to inpatient CIR on  04/10/19, now readmitted from CIR 04/14/19 with hyperkalemia.  Subjective: pleasant, lying in bed, aphasic and unable to speak Assessment / Plan / Recommendation CHL IP CLINICAL IMPRESSIONS 04/20/2019 Clinical Impression Pt presents with severe oral phase deficits c/b decreased difficulty lingually manipulating bolus with cyclical tongue pattern. This inability to manipulate bolus results significantly decreased bolus cohesion,  difficulty  propelling bolus posteriorally, premature spillage and pieacemeal swallowing of all boluses. With soft solid and pill whole in puree, pt was unable to clear oral residue with pill being removed by SLP.  Although pt's swallow initiation occurs at the vallucula for nectar thick liquids and pyriform sinuses for thin liquids, no evidence of aspiraiton was observed when consuming either liquid. Pt was able to achieve good epiglottic deflection with only one instance of penetration. 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 Would recommend dysphagia 1 with thin liquids via cup sips, medicine crushed in puree with full nursing supervision.  SLP Visit Diagnosis Aphasia (R47.01);Apraxia (R48.2);Dysphagia, oropharyngeal phase (R13.12) Attention and concentration deficit following -- Frontal lobe and executive function deficit following -- Impact on safety and function Moderate aspiration risk   CHL IP TREATMENT RECOMMENDATION 04/20/2019 Treatment Recommendations Therapy as outlined in treatment plan below   No flowsheet data found. CHL IP DIET RECOMMENDATION 04/20/2019 SLP Diet Recommendations Dysphagia 1 (Puree) solids;Thin liquid Liquid Administration via Cup Medication Administration Crushed with puree Compensations Minimize environmental distractions;Slow rate;Small sips/bites;Lingual sweep for clearance of pocketing Postural Changes Seated upright at 90 degrees   CHL IP OTHER RECOMMENDATIONS 04/20/2019 Recommended Consults -- Oral Care Recommendations Oral care BID Other Recommendations --   CHL IP FOLLOW UP RECOMMENDATIONS 04/20/2019 Follow up Recommendations Inpatient Rehab   CHL IP FREQUENCY AND DURATION 04/17/2019 Speech Therapy Frequency (ACUTE ONLY) min 2x/week Treatment Duration --      CHL IP ORAL PHASE 04/20/2019 Oral Phase Impaired Oral - Pudding Teaspoon -- Oral - Pudding Cup -- Oral  - Honey Teaspoon NT Oral - Honey Cup -- Oral - Nectar Teaspoon NT Oral - Nectar Cup NT Oral - Nectar Straw -- Oral - Thin Teaspoon Weak lingual manipulation;Reduced posterior propulsion;Right anterior bolus loss;Delayed oral transit;Decreased bolus cohesion;Premature spillage;Piecemeal swallowing;Lingual pumping Oral - Thin Cup Right anterior bolus loss;Weak lingual manipulation;Reduced posterior propulsion;Delayed oral transit;Decreased bolus cohesion;Premature spillage;Piecemeal swallowing;Lingual pumping Oral - Thin Straw -- Oral - Puree Weak lingual manipulation;Reduced posterior propulsion;Decreased bolus cohesion;Premature spillage;Delayed oral transit;Lingual pumping Oral - Mech Soft Impaired mastication;Weak lingual manipulation;Lingual pumping;Delayed oral transit;Reduced posterior propulsion;Decreased bolus cohesion;Premature spillage Oral - Regular -- Oral - Multi-Consistency -- Oral - Pill (No Data) Oral Phase - Comment --  CHL IP PHARYNGEAL PHASE 04/20/2019 Pharyngeal Phase Impaired Pharyngeal- Pudding Teaspoon -- Pharyngeal -- Pharyngeal- Pudding Cup -- Pharyngeal -- Pharyngeal- Honey Teaspoon NT Pharyngeal -- Pharyngeal- Honey Cup -- Pharyngeal -- Pharyngeal- Nectar Teaspoon Delayed swallow initiation-vallecula Pharyngeal Material does not enter airway Pharyngeal- Nectar Cup Delayed swallow initiation-vallecula Pharyngeal Material does not enter airway Pharyngeal- Nectar Straw -- Pharyngeal -- Pharyngeal- Thin Teaspoon Delayed swallow initiation-pyriform sinuses Pharyngeal -- Pharyngeal- Thin Cup Delayed swallow initiation-pyriform sinuses Pharyngeal -- Pharyngeal- Thin Straw -- Pharyngeal -- Pharyngeal- Puree Delayed swallow initiation-vallecula Pharyngeal Material does not enter airway Pharyngeal- Mechanical Soft Delayed swallow initiation-vallecula Pharyngeal -- Pharyngeal- Regular -- Pharyngeal -- Pharyngeal- Multi-consistency -- Pharyngeal -- Pharyngeal- Pill (No Data) Pharyngeal -- Pharyngeal  Comment --  CHL IP CERVICAL ESOPHAGEAL PHASE 04/20/2019 Cervical Esophageal  Phase WFL Pudding Teaspoon -- Pudding Cup -- Honey Teaspoon -- Honey Cup -- Nectar Teaspoon -- Nectar Cup -- Nectar Straw -- Thin Teaspoon -- Thin Cup -- Thin Straw -- Puree -- Mechanical Soft -- Regular -- Multi-consistency -- Pill -- Cervical Esophageal Comment -- Happi Overton 04/20/2019, 4:42 PM               Labs: BMET Recent Labs  Lab 04/17/19 0449  04/18/19 0251 04/19/19 0532 04/19/19 1633 04/19/19 2137 04/20/19 1142 04/20/19 1844 04/21/19 0752  NA 143  --  140 138 137  --  138 136 138  K 5.5*   < > 5.8* 6.0* 6.6* 5.8* 5.7* 5.1 5.0  CL 104  --  104 107 105  --  97* 97* 95*  CO2 26  --  --  GLUCOSE 95  --  96 93 99  --  111* 139* 99  BUN 80*  --  75* 68* 71*  --  72* 75* 76*  CREATININE 4.08*  --  4.01* 3.78* 3.73*  --  4.08* 4.29* 4.32*  CALCIUM 8.8*  --  9.0 9.3 9.4  --  10.0 9.2 9.1   < > = values in this interval not displayed.   CBC Recent Labs  Lab 04/15/19 0549 04/16/19 0341 04/17/19 0449 04/18/19 0251 04/19/19 0532  WBC 7.9 9.5 8.1 9.1 10.1  NEUTROABS 4.5  --   --   --  6.9  HGB 10.3* 10.0* 9.1* 9.1* 9.5*  HCT 33.7* 33.2* 30.8* 30.5* 31.6*  MCV 94.9 95.4 95.1 94.4 93.2  PLT 190 220 219 230 233    Medications:    . aspirin  300 mg Rectal Daily   Or  . aspirin  325 mg Oral Daily  . atorvastatin  80 mg Oral q1800  . blood pressure control book   Does not apply Once  . clopidogrel  75 mg Oral Daily  . heparin  5,000 Units Subcutaneous Q8H  . [START ON 04/22/2019] metolazone  2.5 mg Oral Daily  . metoprolol tartrate  25 mg Oral BID  . patiromer  25.2 g Oral Daily  . sodium bicarbonate  650 mg Oral TID   Arita Miss 04/21/2019

## 2019-04-21 NOTE — Progress Notes (Signed)
Physical Therapy Session Note  Patient Details  Name: Gregory [name] MRN: [id number] Date of Birth: 1951-10-09  Today's Date: 04/21/2019 PT Individual Time: 0904-1005 PT Individual Time Calculation (min): 61 min   Short Term Goals: Week 1:  PT Short Term Goal 1 (Week 1): Pt will perform bed<>chair transfer with mod assist PT Short Term Goal 2 (Week 1): Pt will ambulate 10' w/ max assist w/ LRAD PT Short Term Goal 3 (Week 1): Pt will perform bed mobility with min assist  Skilled Therapeutic Interventions/Progress Updates:    Pt received sitting with HOB fully elevated and nursing staff present for medication administration. Pt agreeable to therapy session. Performed supine to sit, HOB fully elevated and using bedrails, with CGA for trunk support and increased time with cuing for sequencing of mobility. Pt requesting toileting opportunity for BM. Pt performed anterior seated scooting on EOB with CGA for trunk support and therapist providing visual demonstration for head/hips relationship for improved scooting with pt then demonstrating increased amplitude of movement. Pt performed sit<>stand from EOB to stedy with mod assist for lifting - cuing for increased upright trunk posture and improved midline orientation due to pt demonstrating R lateral and anterior trunk lean. Pt transported from EOB to toilet using stedy. Pt required max assist LB clothing management and peri-care for cleanliness. Pt passed gas but did not have BM. Performed sit<>stand from toilet in stedy with mod assist for lifting and continues to require cuing for increased upright trunk posture. Pt transported back to EOB in stedy. Performed sit<>stand in stedy with mod assist for lifting and cuing/manual facilitation for increased trunk extension for upright posture and transferred into w/c with pt requesting to go to therapy gym. Pt transported to/from therapy gym in w/c. Performed R lateral scoot transfer w/c to EOM with mod assist  for lifting/pivoting hips with manual facilitation and cuing for head/hips relationship. Pt initiated sitting balance tasks EOM with pt suddenly reporting lightheadedness (able to report via yes/no head nods to therapist's questions). Assessed vitals using dynamap: in R UE BP 124/105 (MAP 113) and HR varying between 77 and 106bpm reassessed in L UE within 3 minutes and BP 110/40 (MAP 63) and HR varying between 33 and 88bpm - PA notified and requested RN perform manual BP - RN notified. Pt performed L lateral scoot/squat pivot transfer EOM to w/c with mod assist for lifting and pivoting hips with cuing and manual facilitation for head/hips relationship. Pt transported back to room in w/c and pt requests to return to bed. Performed R lateral scoot transfer w/c to EOB with max assist for lifting/pivoting hips with cuing for head/hips relationship. Performed sit to supine with mod assist for B LE management. Pt left supine in bed with needs in reach, lines intact, and bed alarm on.  Therapy Documentation Precautions:  Precautions Precautions: Fall Precaution Comments: Expressive aphasia, R hemiparesis Restrictions Weight Bearing Restrictions: No  Pain:   Reports R shoulder pain during sitting balance task on EOM - avoided weight bearing for pain management.    Therapy/Group: Individual Therapy  Ginny Forth, PT, DPT 04/21/2019, 7:45 AM

## 2019-04-21 NOTE — Progress Notes (Signed)
[facility] PHYSICAL MEDICINE & REHABILITATION PROGRESS NOTE   Subjective/Complaints:  Remains aphasic, fair accuracy with Y/N questions  ROS- neg CP SOB, N/V/D   Objective:   Dg Swallowing Func-speech Pathology  Result Date: 04/20/2019 Objective Swallowing Evaluation: Type of Study: MBS-Modified Barium Swallow Study  Patient Details Name: Gregory [name] MRN: [id number] Date of Birth: 09-18-1951 Today's Date: 04/20/2019 Time: SLP Start Time (ACUTE ONLY): 1336 -SLP Stop Time (ACUTE ONLY): 1359 SLP Time Calculation (min) (ACUTE ONLY): 23 min Past Medical History: Past Medical History: Diagnosis Date . AKI (acute kidney injury) (HCC)  . CKD (chronic kidney disease) stage 2, GFR 60-89 ml/min  . CVA (cerebral vascular accident) (HCC)  . Hyperlipidemia  . Hypertension  . PVD (peripheral vascular disease) (HCC)  Past Surgical History: Past Surgical History: Procedure Laterality Date . AORTIC ARCH ANGIOGRAPHY N/A 09/09/2018  Procedure: AORTIC ARCH ANGIOGRAPHY;  Surgeon: Sherren Kerns, MD;  Location: [facility] INVASIVE CV LAB;  Service: Cardiovascular;  Laterality: N/A; . CAROTID ANGIOGRAPHY Bilateral 09/09/2018  Procedure: CAROTID ANGIOGRAPHY;  Surgeon: Sherren Kerns, MD;  Location: [facility] INVASIVE CV LAB;  Service: Cardiovascular;  Laterality: Bilateral; . LOOP RECORDER INSERTION N/A 04/07/2019  Procedure: LOOP RECORDER INSERTION;  Surgeon: Marinus Maw, MD;  Location: [facility] INVASIVE CV LAB;  Service: Cardiovascular;  Laterality: N/A; HPI: Pt is a 68 y.o. male with medical history significant of chronic kidney disease stage III, hypertension, hyperlipidemia, who presented to the hospital with chief complaint of sudden onset right-sided weakness and inability to talk.  MRI of the brain revealed 3.5 cm region of acute infarction affecting the left insula and posterior frontal region. Patient was admitted to to inpatient CIR on 04/10/19, now readmitted from [facility] 04/14/19 with hyperkalemia.  Subjective: pleasant, lying in  bed, aphasic and unable to speak Assessment / Plan / Recommendation CHL IP CLINICAL IMPRESSIONS 04/20/2019 Clinical Impression Pt presents with severe oral phase deficits c/b decreased difficulty lingually manipulating bolus with cyclical tongue pattern. This inability to manipulate bolus results significantly decreased bolus cohesion,  difficulty propelling bolus posteriorally, premature spillage and pieacemeal swallowing of all boluses. With soft solid and pill whole in puree, pt was unable to clear oral residue with pill being removed by SLP.  Although pt's swallow initiation occurs at the vallucula for nectar thick liquids and pyriform sinuses for thin liquids, no evidence of aspiraiton was observed when consuming either liquid. Pt was able to achieve good epiglottic deflection with only one instance of penetration. 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 Would recommend dysphagia 1 with thin liquids via cup sips, medicine crushed in puree with full nursing supervision.  SLP Visit Diagnosis Aphasia (R47.01);Apraxia (R48.2);Dysphagia, oropharyngeal phase (R13.12) Attention and concentration deficit following -- Frontal lobe and executive function deficit following -- Impact on safety and function Moderate aspiration risk   CHL IP TREATMENT RECOMMENDATION 04/20/2019 Treatment Recommendations Therapy as outlined in treatment plan below   No flowsheet data found. CHL IP DIET RECOMMENDATION 04/20/2019 SLP Diet Recommendations Dysphagia 1 (Puree) solids;Thin liquid Liquid Administration via Cup Medication Administration Crushed with puree Compensations Minimize environmental distractions;Slow rate;Small sips/bites;Lingual sweep for clearance of pocketing Postural Changes Seated upright at 90 degrees   CHL IP OTHER RECOMMENDATIONS 04/20/2019 Recommended Consults -- Oral Care  Recommendations Oral care BID Other Recommendations --   CHL IP FOLLOW UP RECOMMENDATIONS 04/20/2019 Follow up Recommendations Inpatient Rehab   [facility]  IP FREQUENCY AND DURATION 04/17/2019 Speech Therapy Frequency (ACUTE ONLY) min 2x/week Treatment Duration --      CHL IP ORAL PHASE 04/20/2019 Oral Phase Impaired Oral - Pudding Teaspoon -- Oral - Pudding Cup -- Oral - Honey Teaspoon NT Oral - Honey Cup -- Oral - Nectar Teaspoon NT Oral - Nectar Cup NT Oral - Nectar Straw -- Oral - Thin Teaspoon Weak lingual manipulation;Reduced posterior propulsion;Right anterior bolus loss;Delayed oral transit;Decreased bolus cohesion;Premature spillage;Piecemeal swallowing;Lingual pumping Oral - Thin Cup Right anterior bolus loss;Weak lingual manipulation;Reduced posterior propulsion;Delayed oral transit;Decreased bolus cohesion;Premature spillage;Piecemeal swallowing;Lingual pumping Oral - Thin Straw -- Oral - Puree Weak lingual manipulation;Reduced posterior propulsion;Decreased bolus cohesion;Premature spillage;Delayed oral transit;Lingual pumping Oral - Mech Soft Impaired mastication;Weak lingual manipulation;Lingual pumping;Delayed oral transit;Reduced posterior propulsion;Decreased bolus cohesion;Premature spillage Oral - Regular -- Oral - Multi-Consistency -- Oral - Pill (No Data) Oral Phase - Comment --  CHL IP PHARYNGEAL PHASE 04/20/2019 Pharyngeal Phase Impaired Pharyngeal- Pudding Teaspoon -- Pharyngeal -- Pharyngeal- Pudding Cup -- Pharyngeal -- Pharyngeal- Honey Teaspoon NT Pharyngeal -- Pharyngeal- Honey Cup -- Pharyngeal -- Pharyngeal- Nectar Teaspoon Delayed swallow initiation-vallecula Pharyngeal Material does not enter airway Pharyngeal- Nectar Cup Delayed swallow initiation-vallecula Pharyngeal Material does not enter airway Pharyngeal- Nectar Straw -- Pharyngeal -- Pharyngeal- Thin Teaspoon Delayed swallow initiation-pyriform sinuses Pharyngeal -- Pharyngeal- Thin Cup Delayed swallow initiation-pyriform sinuses  Pharyngeal -- Pharyngeal- Thin Straw -- Pharyngeal -- Pharyngeal- Puree Delayed swallow initiation-vallecula Pharyngeal Material does not enter airway Pharyngeal- Mechanical Soft Delayed swallow initiation-vallecula Pharyngeal -- Pharyngeal- Regular -- Pharyngeal -- Pharyngeal- Multi-consistency -- Pharyngeal -- Pharyngeal- Pill (No Data) Pharyngeal -- Pharyngeal Comment --  CHL IP CERVICAL ESOPHAGEAL PHASE 04/20/2019 Cervical Esophageal Phase WFL Pudding Teaspoon -- Pudding Cup -- Honey Teaspoon -- Honey Cup -- Nectar Teaspoon -- Nectar Cup -- Nectar Straw -- Thin Teaspoon -- Thin Cup -- Thin Straw -- Puree -- Mechanical Soft -- Regular -- Multi-consistency -- Pill -- Cervical Esophageal Comment -- Happi Overton 04/20/2019, 4:42 PM              Recent Labs    04/19/19 0532  WBC 10.1  HGB 9.5*  HCT 31.6*  PLT 233   Recent Labs    04/20/19 1844 04/21/19 0752  NA 136 138  K 5.1 5.0  CL 97* 95*  CO2 25 30  GLUCOSE 139* 99  BUN 75* 76*  CREATININE 4.29* 4.32*  CALCIUM 9.2 9.1    Intake/Output Summary (Last 24 hours) at 04/21/2019 0934 Last data filed at 04/21/2019 0831 Gross per 24 hour  Intake 600 ml  Output 1075 ml  Net -475 ml     Physical Exam: Vital Signs Blood pressure 134/72, pulse 71, temperature (!) 97.5 F (36.4 C), resp. rate (!) 22, height 5\' 4"  (1.626 m), weight 81.9 kg, SpO2 97 %.   General: No acute distress Mood and affect are appropriate Heart: Regular rate and rhythm no rubs murmurs or extra sounds Lungs: Clear to auscultation, breathing unlabored, no rales or wheezes Abdomen: Positive bowel sounds, soft nontender to palpation, nondistended Extremities: No clubbing, cyanosis, or edema Skin: No evidence of breakdown, no evidence of rash Neurologic: Cranial nerves II through XII intact, motor strength is 5/5 in Left and 2-5 Right deltoid, bicep, tricep, grip, hip flexor, knee extensors,  Trace Right ankle dorsiflexor and plantar flexor Sensory exam normal sensation  to light touch and proprioception in bilateral upper and lower extremities Cerebellar exam normal finger to nose to finger as well as heel to  shin in bilateral upper and lower extremities Musculoskeletal: Full range of motion in all 4 extremities. No joint swelling   Assessment/Plan: 1. 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  Physiatrist is providing close team supervision and 24 hour management of active medical problems listed below.  Physiatrist and rehab team continue to assess barriers to discharge/monitor patient progress toward functional and medical goals  Care Tool:  Bathing    Body parts bathed by patient: Right arm, Chest, Abdomen, Front perineal area, Left upper leg, Right upper leg, Face   Body parts bathed by helper: Buttocks, Left arm Body parts n/a: Left lower leg, Right lower leg   Bathing assist Assist Level: Maximal Assistance - Patient 24 - 49%     Upper Body Dressing/Undressing Upper body dressing   What is the patient wearing?: Hospital gown only    Upper body assist Assist Level: Maximal Assistance - Patient 25 - 49%    Lower Body Dressing/Undressing Lower body dressing      What is the patient wearing?: Incontinence brief     Lower body assist Assist for lower body dressing: Maximal Assistance - Patient 25 - 49%     Toileting Toileting    Toileting assist Assist for toileting: Dependent - Patient 0%     Transfers Chair/bed transfer  Transfers assist     Chair/bed transfer assist level: Dependent - mechanical lift(stedy)     Locomotion Ambulation   Ambulation assist   Ambulation activity did not occur: Safety/medical concerns          Walk 10 feet activity   Assist  Walk 10 feet activity did not occur: Safety/medical concerns        Walk 50 feet activity   Assist Walk  50 feet with 2 turns activity did not occur: Safety/medical concerns         Walk 150 feet activity   Assist Walk 150 feet activity did not occur: Safety/medical concerns         Walk 10 feet on uneven surface  activity   Assist Walk 10 feet on uneven surfaces activity did not occur: Safety/medical concerns         Wheelchair     Assist Will patient use wheelchair at discharge?: Yes Type of Wheelchair: Manual    Wheelchair assist level: Dependent - Patient 0% Max wheelchair distance: 150 ft    Wheelchair 50 feet with 2 turns activity    Assist        Assist Level: Dependent - Patient 0%   Wheelchair 150 feet activity     Assist     Assist Level: Dependent - Patient 0%    Medical Problem List and Plan: 1.Right hemiparesis and aphasia/dysphagiasecondary to left insular and posterior frontal infarct embolic secondary to non-STEMI. Status post loop recorder -CIR PT, OT, SLP  2. Antithrombotics: -DVT/anticoagulation:Subcutaneous heparin. -antiplatelet therapy: Aspirin 325 mg daily, Plavix 75 mg daily 3. Pain Management:Tylenol as needed 4. Mood:Provide emotional support -antipsychotic agents: N/A 5. Neuropsych: This patientiscapable of making decisions on hisown behalf. 6. Skin/Wound Care:Routine skin checks 7. Fluids/Electrolytes/Nutrition:Routine in and outs with follow-up chemistries HyperK increase Veltassa per nephrology,  8.Dysphagia. Dysphagia #1 honey thick liquids. Monitor hydration/labs. Monitor for aspiration, also needs IVF for hydration  9.Acute on chronic kidney disease stage III/hyperkalemia with obstructive uropathy/BPH. Follow-up urology services as well as nephrology services.  -FOLEY TUBE TO  REMAIN IN PLACE UNTIL FOLLOW UP OUT PATIENT WITH UROLOGY 10.Hyperlipidemia. Lipitor 11.Hypertension. Lopressor 25 mg twice daily. Monitor with increased  mobility Vitals:   04/20/19 2019 04/21/19 0618  BP: 117/66 134/72  Pulse: 81 71  Resp:  (!) 22  Temp: 97.8 F (36.6 C) (!) 97.5 F (36.4 C)  SpO2: 97% 97%  5/1 controlled 12.OSA. Continue CPAP    LOS: 3 days A FACE TO FACE EVALUATION WAS PERFORMED  Erick Colace 04/21/2019, 9:34 AM

## 2019-04-21 NOTE — IPOC Note (Signed)
Overall Plan of Care [facility](IPOC) Patient Details Name: Gregory [name] MRN: [id number] DOB: 01-Jun-1951  Admitting Diagnosis: <principal problem not specified>  Hospital Problems: Active Problems:   Frontal lobe deficit     Functional Problem List: Nursing Behavior, Bladder, Bowel, Medication Management, Motor, Nutrition, Safety, Endurance, Sensory, Skin Integrity  PT Balance, Endurance, Motor, Perception, Pain, Safety, Sensory  OT Balance, Behavior, Skin Integrity, Cognition, Edema, Endurance, Motor, Pain, Perception, Safety, Sensory  SLP    TR         Basic ADL's: OT Eating, Grooming, Bathing, Dressing, Toileting     Advanced  ADL's: OT       Transfers: PT Bed Mobility, Bed to Chair, Floor, Furniture, Customer service managerCar  OT Toilet, Research scientist (life sciences)Tub/Shower     Locomotion: PT Ambulation, Stairs, Psychologist, prison and probation servicesWheelchair Mobility     Additional Impairments: OT Fuctional Use of Upper Extremity  SLP Swallowing comprehension, expression    TR      Anticipated Outcomes Item Anticipated Outcome  Self Feeding    Swallowing  intermittent supervision at meals, toleration of mechanical soft solids and thiin liquids   Basic self-care  min A  Toileting  min A   Bathroom Transfers min A  Bowel/Bladder  Manage bowel and bladder with mod assist   Transfers  min assist  Locomotion  mod assist short distance gait  Communication  minimal assistance to convey basic needs/ideas  Cognition     Pain  < 3 out of 10   Safety/Judgment  Pt will remain free of falls with injury while in rehab with min cues.    Therapy Plan: PT Intensity: Minimum of 1-2 x/day ,45 to 90 minutes PT Frequency: 5 out of 7 days PT Duration Estimated Length of Stay: 3-4 weeks OT Intensity: Minimum of 1-2 x/day, 45 to 90 minutes OT Frequency: 5 out of 7 days OT Duration/Estimated Length of Stay: 3-4 weeks SLP Intensity: Minumum of 1-2 x/day, 30 to 90 minutes SLP Frequency: 3 to 5 out of 7 days   Due to the current state of emergency,  patients may not be receiving their 3-hours of Medicare-mandated therapy.   Team Interventions: Nursing Interventions Patient/Family Education, Bladder Management, Disease Management/Prevention, Bowel Management, Dysphagia/Aspiration Precaution Training, Medication Management, Cognitive Remediation/Compensation, Skin Care/Wound Management, Discharge Planning  PT interventions Ambulation/gait training, Disease management/prevention, Pain management, Stair training, Patient/family education, DME/adaptive equipment instruction, Warden/rangerBalance/vestibular training, Therapeutic Activities, Wheelchair propulsion/positioning, Cognitive remediation/compensation, Functional electrical stimulation, Psychosocial support, Therapeutic Exercise, Functional mobility training, Community reintegration, Skin care/wound management, UE/LE Strength taining/ROM, Discharge planning, Neuromuscular re-education, Splinting/orthotics, UE/LE Coordination activities, Visual/perceptual remediation/compensation  OT Interventions Balance/vestibular training, Cognitive remediation/compensation, Discharge planning, DME/adaptive equipment instruction, Functional electrical stimulation, Neuromuscular re-education, Functional mobility training, Pain management, Patient/family education, Psychosocial support, Self Care/advanced ADL retraining, Skin care/wound managment, Splinting/orthotics, Therapeutic Activities, Therapeutic Exercise, UE/LE Strength taining/ROM, UE/LE Coordination activities, Visual/perceptual remediation/compensation, Wheelchair propulsion/positioning  SLP Interventions Environmental controls, Speech/Language facilitation, Cueing hierarchy, Functional tasks, Therapeutic Activities, Oral motor exercises, Dysphagia/aspiration precaution training, Patient/family education  TR Interventions    SW/CM Interventions Discharge Planning, Psychosocial Support, Patient/Family Education   Barriers to Discharge MD  Medical stability  Nursing  Medical stability, Decreased caregiver support    PT Inaccessible home environment, Home environment access/layout 3-4 steps to enter home  OT Other (comments) unsure of family support  SLP      SW       Team Discharge Planning: Destination: PT-Home ,OT- Home , SLP-Home Projected Follow-up: PT-Home health PT, OT-  Home health OT, 24 hour supervision/assistance, SLP-Home Health  SLP Projected Equipment Needs: PT-To be determined, OT- 3 in 1 bedside comode, Tub/shower bench, SLP-To be determined Equipment Details: PT- , OT-  Patient/family involved in discharge planning: PT- Patient,  OT-Patient, SLP-Patient  MD ELOS: 20-24d Medical Rehab Prognosis:  Good Assessment:  68 year old right-handed male with history of chronic kidney disease stage III with creatinine baseline 1.6-1.7, hypertension maintained on HCTZ and lisinopril, hyperlipidemia. Per chart review lives with spouse reportedly independent prior to admission. Presented 04/05/2019 with acute onset of right-sided weakness and slurred speech. Cranial CT scan showed possible early gray-white differentiation loss in the insula on the left. Chronic small vessel ischemic changes. 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 No evidence of gross hemorrhage. MRA negative. Patient did not receive TPA. Noted findings of hyperkalemia 6.8 as well as elevated creatinine 10.4 from baseline 1.70, troponin 3.29.EKG with ST changes. Cardiology services consulted for suspect non-STEMI medical management recommended. Patient did undergo placement of loop recorder 04/07/2019 per Dr. Ladona Ridgel. Renal ultrasound showed bilateral hydronephrosis with nephrology services consulted as well as urology. A Foley catheter tube was placed and would remain in place until follow-up outpatient urology. Renal function continued to improve and monitored with creatinine 4.08. Maintained on aspirin for CVA  prophylaxis. Subcutaneous heparin for DVT prophylaxis. Dysphagia #1 honey thick liquid diet. Patient was admitted to inpatient rehab services 04/10/2019 with steady progressive gains. Noted on 04/14/2019 with noted ongoing bouts of hyperkalemia and did not respond as expected to Lasix.. Nephrology recommended Kayexalate enema and insulin with dextrose. Patient became more confused and it was felt would benefit from discharge to acute care services for ongoing monitoring. EKG follow-up without peaked T waves hemodynamically stable but remained on telemetry. Hyperkalemia improving 5.8 and placed on VELTASSA16.8g dailyand patient and family were to be instructed also on a low potassium diet restriction    Now requiring 24/7 Rehab RN,MD, as well as CIR level PT, OT and SLP.  Treatment team will focus on ADLs and mobility with goals set at [facility] See Team Conference Notes for weekly updates to the plan of care

## 2019-04-21 NOTE — Progress Notes (Signed)
Speech Language Pathology Daily Session Note  Patient Details  Name: Gregory [name] MRN: [id number] Date of Birth: February 07, 1951  Today's Date: 04/21/2019 SLP Individual Time: 1000-1050 SLP Individual Time Calculation (min): 50 min  Short Term Goals: Week 1: SLP Short Term Goal 1 (Week 1): Pt will tolerate PO diet (TBD after this week's MBS) without overt s/s aspiration given min A verbal and visual cues for use of safe swallow strategies. SLP Short Term Goal 2 (Week 1): Pt will follow two-step verbal directions with 80% accuracy. SLP Short Term Goal 3 (Week 1): Pt will successfully communicate name of object (blind to clinician) through use of augmentative communication resources on 7/10 trials.  SLP Short Term Goal 4 (Week 1): pt will produce 3-5 vowel sounds and or C-V combinations with 50% accuracy given max verbal/visual cues.  Skilled Therapeutic Interventions: Pt was seen for skilled ST intervention targeting goals for effective communication. Pt declined po trials during this session. Pt was able to follow 2-step verbal directions with 30% accuracy. He was accurate for following one out of two steps with 60% accuracy. Pt was able to indicate object name/function (field of 7 objects) given phrase description (ex: write with it, paint with it, etc) with 73% accuracy (11/15). The remaining 4/15 missed in FO7 was reduced to field of 4, with 50% accuracy. Given max visual and auditory cues, pt was stimulable for vowel sounds /ah/ and /oh/ with 90% accuracy. /ay/, /ee/, /I/, and /oo/ were unsuccessful. Pt was stimulable for /bah/ and /boh/ x10 each. He verbalized /bau/ x1, but was unable to reproduce it given max visual and auditory cues. Pt was initially stimulable for /mah/, however, following CV drills of initial phoneme /b/, pt was unable to transition back to correct production of /mah/. Pt attended well to session, then leaned his bed back and closed his eyes. When asked if he was tired and  wanted to stop, pt indicated yes to both. Pt was left in bed with alarm on, all needs within reach. Continued ST intervention is recommended per current plan of care.   Pain Pain Assessment Pain Scale: 0-10 Pain Score: 0-No pain  Therapy/Group: Individual Therapy    B. Murvin Natal, [facility], CCC-SLP Speech Language Pathologist  Leigh Aurora 04/21/2019, 10:58 AM

## 2019-04-22 ENCOUNTER — Inpatient Hospital Stay (HOSPITAL_COMMUNITY): Payer: Medicare HMO | Admitting: Speech Pathology

## 2019-04-22 DIAGNOSIS — E875 Hyperkalemia: Secondary | ICD-10-CM

## 2019-04-22 DIAGNOSIS — I10 Essential (primary) hypertension: Secondary | ICD-10-CM

## 2019-04-22 DIAGNOSIS — N179 Acute kidney failure, unspecified: Secondary | ICD-10-CM

## 2019-04-22 DIAGNOSIS — N183 Chronic kidney disease, stage 3 unspecified: Secondary | ICD-10-CM

## 2019-04-22 DIAGNOSIS — I69391 Dysphagia following cerebral infarction: Secondary | ICD-10-CM

## 2019-04-22 DIAGNOSIS — R41844 Frontal lobe and executive function deficit: Secondary | ICD-10-CM

## 2019-04-22 DIAGNOSIS — D62 Acute posthemorrhagic anemia: Secondary | ICD-10-CM

## 2019-04-22 DIAGNOSIS — R4701 Aphasia: Secondary | ICD-10-CM

## 2019-04-22 LAB — BASIC METABOLIC PANEL
Anion gap: 13 (ref 5–15)
Anion gap: 16 — ABNORMAL HIGH (ref 5–15)
BUN: 84 mg/dL — ABNORMAL HIGH (ref 8–23)
BUN: 88 mg/dL — ABNORMAL HIGH (ref 8–23)
CO2: 28 mmol/L (ref 22–32)
CO2: 26 mmol/L (ref 22–32)
Calcium: 9 mg/dL (ref 8.9–10.3)
Calcium: 9.1 mg/dL (ref 8.9–10.3)
Chloride: 97 mmol/L — ABNORMAL LOW (ref 98–111)
Chloride: 95 mmol/L — ABNORMAL LOW (ref 98–111)
Creatinine, Ser: 4.57 mg/dL — ABNORMAL HIGH (ref 0.61–1.24)
Creatinine, Ser: 4.74 mg/dL — ABNORMAL HIGH (ref 0.61–1.24)
GFR calc Af Amer: 14 mL/min — ABNORMAL LOW (ref >60)
GFR calc Af Amer: 14 mL/min — ABNORMAL LOW (ref >60)
GFR calc non Af Amer: 12 mL/min — ABNORMAL LOW (ref >60)
GFR calc non Af Amer: 12 mL/min — ABNORMAL LOW (ref >60)
Glucose, Bld: 102 mg/dL — ABNORMAL HIGH (ref 70–99)
Glucose, Bld: 121 mg/dL — ABNORMAL HIGH (ref 70–99)
Potassium: 5.6 mmol/L — ABNORMAL HIGH (ref 3.5–5.1)
Potassium: 5.3 mmol/L — ABNORMAL HIGH (ref 3.5–5.1)
Sodium: 138 mmol/L (ref 135–145)
Sodium: 137 mmol/L (ref 135–145)

## 2019-04-22 MED ORDER — TORSEMIDE 20 MG PO TABS
20.0000 mg | ORAL_TABLET | Freq: Every day | ORAL | Status: DC
  Administered 2019-04-22 – 2019-04-23 (×2): 20 mg via ORAL
  Filled 2019-04-22 (×2): qty 1

## 2019-04-22 MED ORDER — PATIROMER SORBITEX CALCIUM 8.4 G PO PACK
25.2000 g | PACK | Freq: Every day | ORAL | Status: DC
  Administered 2019-04-22 – 2019-04-30 (×9): 25.2 g via ORAL
  Filled 2019-04-22 (×12): qty 3

## 2019-04-22 NOTE — Progress Notes (Signed)
[facility] PHYSICAL MEDICINE & REHABILITATION PROGRESS NOTE   Subjective/Complaints: Patient seen laying in bed this morning.  He appears to indicate that he did not sleep well overnight due to?  Urinary symptoms.  Seen by nephrology, notes reviewed.  ROS-appears to deny CP, shortness of breath, nausea, vomiting, diarrhea.   Objective:   No results found. No results for input(s): WBC, HGB, HCT, PLT in the last 72 hours. Recent Labs    04/21/19 1832 04/22/19 0738  NA 137 138  K 5.5* 5.6*  CL 90* 97*  CO2 29 28  GLUCOSE 101* 102*  BUN 79* 84*  CREATININE 4.55* 4.57*  CALCIUM 9.3 9.0    Intake/Output Summary (Last 24 hours) at 04/22/2019 1221 Last data filed at 04/22/2019 [id number] Gross per 24 hour  Intake 392 ml  Output 1125 ml  Net -733 ml     Physical Exam: Vital Signs Blood pressure (!) 124/54, pulse 96, temperature 97.7 F (36.5 C), temperature source Oral, resp. rate 20, height 5\' 4"  (1.626 m), weight 81.9 kg, SpO2 97 %. Constitutional: No distress . Vital signs reviewed. HENT: Normocephalic.  Atraumatic. Eyes: EOMI. No discharge. Cardiovascular: No JVD. Respiratory: Normal effort. GI: Non-distended. Musc: No edema or tenderness in extremities. Neurologic: Alert Motor: LLE: 5/5 proximal distal RLE: 3-/5 proximal to distal  Aphasia  Assessment/Plan: 1. 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  Physiatrist is providing close team supervision and 24 hour management of active medical problems listed below.  Physiatrist and rehab team continue to assess barriers to discharge/monitor patient progress toward functional and medical goals  Care Tool:  Bathing    Body parts bathed by patient: Right arm, Chest, Abdomen, Front perineal area, Left upper leg, Right upper leg, Face   Body parts bathed by helper: Buttocks, Left  arm Body parts n/a: Left lower leg, Right lower leg   Bathing assist Assist Level: Maximal Assistance - Patient 24 - 49%     Upper Body Dressing/Undressing Upper body dressing   What is the patient wearing?: Hospital gown only    Upper body assist Assist Level: Moderate Assistance - Patient 50 - 74%    Lower Body Dressing/Undressing Lower body dressing      What is the patient wearing?: Incontinence brief, Pants     Lower body assist Assist for lower body dressing: Maximal Assistance - Patient 25 - 49%     Toileting Toileting    Toileting assist Assist for toileting: Moderate Assistance - Patient 50 - 74%     Transfers Chair/bed transfer  Transfers assist     Chair/bed transfer assist level: Moderate Assistance - Patient 50 - 74%(stedy)     Locomotion Ambulation   Ambulation assist   Ambulation activity did not occur: Safety/medical concerns          Walk 10 feet activity   Assist  Walk 10 feet activity did not occur: Safety/medical concerns        Walk 50 feet activity   Assist Walk 50 feet with 2 turns activity did not occur: Safety/medical concerns         Walk 150 feet activity   Assist Walk 150 feet activity did not occur: Safety/medical concerns         Walk 10 feet on uneven surface  activity   Assist Walk 10 feet on uneven surfaces activity did not occur: Safety/medical concerns  Wheelchair     Assist Will patient use wheelchair at discharge?: Yes Type of Wheelchair: Manual    Wheelchair assist level: Dependent - Patient 0% Max wheelchair distance: 150 ft    Wheelchair 50 feet with 2 turns activity    Assist        Assist Level: Dependent - Patient 0%   Wheelchair 150 feet activity     Assist     Assist Level: Dependent - Patient 0%    Medical Problem List and Plan: 1.Right hemiparesis and aphasia/dysphagiasecondary to left insular and posterior frontal infarct embolic  secondary to non-STEMI. Status post loop recorder  Continue CIR 2. Antithrombotics: -DVT/anticoagulation:Subcutaneous heparin. -antiplatelet therapy: Aspirin 325 mg daily, Plavix 75 mg daily 3. Pain Management:Tylenol as needed 4. Mood:Provide emotional support -antipsychotic agents: N/A 5. Neuropsych: This patientisnot fully capable of making decisions on hisown behalf. 6. Skin/Wound Care:Routine skin checks 7. Fluids/Electrolytes/Nutrition:Routine in and outs  Hyperkalemia, increased Veltassa per nephrology,    Potassium remains elevated 5.6 on 5/2, meds adjusted 8.Dysphagia. Dysphagia #1 honey thick liquids.   Advanced to D1 thins, continue to advance as tolerated   Monitor for aspiration, also needs IVF for hydration  9.Acute on chronic kidney disease stage III/hyperkalemia with obstructive uropathy/BPH. Follow-up urology services as well as nephrology services. -FOLEY TUBE TO REMAIN IN PLACE UNTIL FOLLOW UP OUT PATIENT WITH UROLOGY  Creatinine 4.57 on 5/2  Recs per nephro 10.Hyperlipidemia. Lipitor 11.Hypertension. Lopressor 25 mg twice daily. Monitor with increased mobility Vitals:   04/22/19 0419 04/22/19 0825  BP: 103/81 (!) 124/54  Pulse: 96   Resp: 20   Temp: 97.7 F (36.5 C)   SpO2: 97%    Controlled on 5/2 12.OSA. Continue CPAP 13.  Acute blood loss anemia  Hemoglobin 9.5 on 4/29  Continue to monitor   LOS: 4 days A FACE TO FACE EVALUATION WAS PERFORMED  Norvel Wenker Karis Juba 04/22/2019, 12:21 PM

## 2019-04-22 NOTE — Plan of Care (Signed)
  Problem: Consults Goal: RH STROKE PATIENT EDUCATION Description See Patient Education module for education specifics  Outcome: Progressing   Problem: RH SKIN INTEGRITY Goal: RH STG SKIN FREE OF INFECTION/BREAKDOWN Description No new breakdown with min assist   Outcome: Progressing   Problem: RH SAFETY Goal: RH STG ADHERE TO SAFETY PRECAUTIONS W/ASSISTANCE/DEVICE Description STG Adhere to Safety Precautions With Min Assistance/Device.  Outcome: Progressing   Problem: RH COGNITION-NURSING Goal: RH STG USES MEMORY AIDS/STRATEGIES W/ASSIST TO PROBLEM SOLVE Description STG Uses Memory Aids/Strategies With Min Assistance to Problem Solve.  Outcome: Progressing   Problem: RH PAIN MANAGEMENT Goal: RH STG PAIN MANAGED AT OR BELOW PT'S PAIN GOAL Description < 3 out of 10.    Outcome: Progressing   Problem: RH BOWEL ELIMINATION Goal: RH STG MANAGE BOWEL WITH ASSISTANCE Description STG Manage Bowel with Mod Assistance.  Outcome: Not Progressing   Problem: RH BLADDER ELIMINATION Goal: RH STG MANAGE BLADDER WITH EQUIPMENT WITH ASSISTANCE Description STG Manage Bladder With Equipment With Max Assistance  Outcome: Not Progressing

## 2019-04-22 NOTE — Progress Notes (Signed)
Speech Language Pathology Daily Session Note  Patient Details  Name: Ledon Muldrew MRN: [id number] Date of Birth: [birth year]  Today's Date: 04/22/2019 SLP Individual Time: 0815-0910 SLP Individual Time Calculation (min): 55 min  Short Term Goals: Week 1: SLP Short Term Goal 1 (Week 1): Pt will tolerate PO diet (TBD after this week's MBS) without overt s/s aspiration given min A verbal and visual cues for use of safe swallow strategies. SLP Short Term Goal 2 (Week 1): Pt will follow two-step verbal directions with 80% accuracy. SLP Short Term Goal 3 (Week 1): Pt will successfully communicate name of object (blind to clinician) through use of augmentative communication resources on 7/10 trials.  SLP Short Term Goal 4 (Week 1): pt will produce 3-5 vowel sounds and or C-V combinations with 50% accuracy given max verbal/visual cues.  Skilled Therapeutic Interventions: Skilled treatment session focused on dysphagia and communication goals. SLP facilitated session by providing skilled observation with breakfast meal of Dys. 1 textures with thin liquids. Patient consumed meal with minimal overt s/s of aspiration but required Min verbal cues for use of small sips to and self-monitor and correct right anterior spillage. Recommend patient continue current diet. SLP also facilitated session by providing total A for utilization of the letter board to answer questions at the word level about biographical information. However, patient utilized the picture communication board more effectively and with overall 75% accuracy. SLP and patient crossed out the pictures that the patient indicated were not important to him. Recommend the pictures that the patient does want are cut out and and rearranged in order to reduce visual field and maximize efficiency. Patient left upright in bed with alarm on and all needs within reach. Continue with current plan of care.      Pain No/Denies Pain   Therapy/Group: Individual  Therapy  Jailynn Lavalais 04/22/2019, 2:07 PM

## 2019-04-22 NOTE — Progress Notes (Signed)
Patient ID: Gregory [name], male   DOB: 1951-04-09, 68 y.o.   MRN: [id number] [facility] Progress Note   Assessment/ Plan:   #  slowly resolving Acute kidney injury on chronic kidney disease stage III:  With bladder outlet obstruction/obstructive uropathy.  It is highly likely that he has had this obstruction for >2 weeks and may have significant residual reduced GFR - leave indwelling Foley until seen by urology as outpatient - Nonoliguric; plateau SCr of around 4-4.5 - Cont NahCO3 as below - CT abdomen/pelvis 4/26 w/ decompression of bladder, foley in place, no HN  # Hyperkalemia   - Cont Veltassa to 25.2gm; stop metolazone 2.5mg  qAM and try torsemide 40 qAM see if K improved. .   - PO NaHCO3 for now - Cont dietary K restriction  # Hypernatremia: resolved  # Hypertension: acceptable   # Anemia: stable; CTM   # Elevated troponin levels: Trending down last eval  # Acute CVA with right-sided weakness/expressive aphasia: in CIR  Subjective:    K improved to 5. on Veltassa 25.2gm, PO NaHCO3, metolazone  SCr up to 4., HCO3 28   Objective:   BP (!) 124/54   Pulse 96   Temp 97.7 F (36.5 C) (Oral)   Resp 20   Ht 5\' 4"  (1.626 m)   Wt 81.9 kg   SpO2 97%   BMI 30.99 kg/m   Intake/Output Summary (Last 24 hours) at 04/22/2019 1135 Last data filed at 04/22/2019 6195 Gross per 24 hour  Intake 392 ml  Output 1125 ml  Net -733 ml   Weight change:   Physical Exam:  Gen: elderly male in bed in NAD, smiling  CVS: RRR; S1, S2 Resp: clear to auscultation bilaterally and normal work of breathing  Abd: obese habitus; softly distended; nontender.   Ext: no lower extremity edema    Neuro: Aphasia; awake and interactive.  Nods and shakes head but mostly nonverbal   Imaging: No results found.  Labs: BMET Recent Labs  Lab 04/19/19 0532 04/19/19 1633 04/19/19 2137 04/20/19 1142 04/20/19 1844 04/21/19 0752 04/21/19 1832 04/22/19 0738  NA 138 137  --  138 136  138 137 138  K 6.0* 6.6* 5.8* 5.7* 5.1 5.0 5.5* 5.6*  CL 107 105  --  97* 97* 95* 90* 97*  CO2 22 22  --  24 25 30 29 28   GLUCOSE 93 99  --  111* 139* 99 101* 102*  BUN 68* 71*  --  72* 75* 76* 79* 84*  CREATININE 3.78* 3.73*  --  4.08* 4.29* 4.32* 4.55* 4.57*  CALCIUM 9.3 9.4  --  10.0 9.2 9.1 9.3 9.0   CBC Recent Labs  Lab 04/16/19 0341 04/17/19 0449 04/18/19 0251 04/19/19 0532  WBC 9.5 8.1 9.1 10.1  NEUTROABS  --   --   --  6.9  HGB 10.0* 9.1* 9.1* 9.5*  HCT 33.2* 30.8* 30.5* 31.6*  MCV 95.4 95.1 94.4 93.2  PLT 220 219 230 233    Medications:    . aspirin  300 mg Rectal Daily   Or  . aspirin  325 mg Oral Daily  . atorvastatin  80 mg Oral q1800  . blood pressure control book   Does not apply Once  . clopidogrel  75 mg Oral Daily  . heparin  5,000 Units Subcutaneous Q8H  . metoprolol tartrate  25 mg Oral BID  . patiromer  25.2 g Oral Daily  . sodium bicarbonate  650 mg Oral  TID  . torsemide  20 mg Oral Daily   Arita MissRyan B Renton Berkley 04/22/2019

## 2019-04-23 ENCOUNTER — Inpatient Hospital Stay (HOSPITAL_COMMUNITY): Payer: Medicare HMO

## 2019-04-23 DIAGNOSIS — R195 Other fecal abnormalities: Secondary | ICD-10-CM

## 2019-04-23 DIAGNOSIS — R0989 Other specified symptoms and signs involving the circulatory and respiratory systems: Secondary | ICD-10-CM

## 2019-04-23 LAB — BASIC METABOLIC PANEL
Anion gap: 12 (ref 5–15)
BUN: 88 mg/dL — ABNORMAL HIGH (ref 8–23)
CO2: 30 mmol/L (ref 22–32)
Calcium: 9.4 mg/dL (ref 8.9–10.3)
Chloride: 96 mmol/L — ABNORMAL LOW (ref 98–111)
Creatinine, Ser: 5.14 mg/dL — ABNORMAL HIGH (ref 0.61–1.24)
GFR calc Af Amer: 12 mL/min — ABNORMAL LOW (ref >60)
GFR calc non Af Amer: 11 mL/min — ABNORMAL LOW (ref >60)
Glucose, Bld: 124 mg/dL — ABNORMAL HIGH (ref 70–99)
Potassium: 5.6 mmol/L — ABNORMAL HIGH (ref 3.5–5.1)
Sodium: 138 mmol/L (ref 135–145)

## 2019-04-23 MED ORDER — CALCIUM POLYCARBOPHIL 625 MG PO TABS: 625.0000 mg | ORAL_TABLET | Freq: Every day | ORAL | Status: DC

## 2019-04-23 NOTE — Progress Notes (Signed)
Patient ID: Gregory [name], male   DOB: 1951-08-16, 68 y.o.   MRN: [id number] [facility] Progress Note   Assessment/ Plan:   #  Acute kidney injury on chronic kidney disease stage III:  With bladder outlet obstruction/obstructive uropathy.  It is highly likely that he has had this obstruction for >2 weeks and may have significant residual reduced GFR - leave indwelling Foley until seen by urology as outpatient - SCr inching up after starting diuretics; will hold for now - Cont NahCO3 as below - CT abdomen/pelvis 4/26 w/ decompression of bladder, foley in place, no HN  # Hyperkalemia   - Cont Veltassa to 25.2gm;  - PO NaHCO3 for now - hold all diuretics - Cont dietary K restriction  # Hypernatremia: resolved  # Hypertension: acceptable   # Anemia: stable; CTM   # Elevated troponin levels: Trending down last eval  # Acute CVA with right-sided weakness/expressive aphasia: in CIR  Subjective:    K stable in 5s but SCr up to 5.14, on torsemide 20mg  qAM  No other issues   Objective:   BP (!) 138/55   Pulse 72   Temp 98.8 F (37.1 C) (Oral)   Resp 16   Ht 5\' 4"  (1.626 m)   Wt 81.9 kg   SpO2 100%   BMI 30.99 kg/m   Intake/Output Summary (Last 24 hours) at 04/23/2019 1128 Last data filed at 04/23/2019 0730 Gross per 24 hour  Intake 372 ml  Output 725 ml  Net -353 ml   Weight change:   Physical Exam:  Gen: elderly male in bed in NAD, smiling  CVS: RRR; S1, S2 Resp: clear to auscultation bilaterally and normal work of breathing  Abd: obese habitus; softly distended; nontender.   Ext: no lower extremity edema    Neuro: Aphasia; awake and interactive.  Nods and shakes head but mostly nonverbal   Imaging: No results found.  Labs: BMET Recent Labs  Lab 04/20/19 1142 04/20/19 1844 04/21/19 0752 04/21/19 1832 04/22/19 0738 04/22/19 1828 04/23/19 0759  NA 138 136 138 137 138 137 138  K 5.7* 5.1 5.0 5.5* 5.6* 5.3* 5.6*  CL 97* 97* 95* 90* 97* 95*  96*  CO2 24 25 30 29 28 26 30   GLUCOSE 111* 139* 99 101* 102* 121* 124*  BUN 72* 75* 76* 79* 84* 88* 88*  CREATININE 4.08* 4.29* 4.32* 4.55* 4.57* 4.74* 5.14*  CALCIUM 10.0 9.2 9.1 9.3 9.0 9.1 9.4   CBC Recent Labs  Lab 04/17/19 0449 04/18/19 0251 04/19/19 0532  WBC 8.1 9.1 10.1  NEUTROABS  --   --  6.9  HGB 9.1* 9.1* 9.5*  HCT 30.8* 30.5* 31.6*  MCV 95.1 94.4 93.2  PLT 219 230 233    Medications:    . aspirin  300 mg Rectal Daily   Or  . aspirin  325 mg Oral Daily  . atorvastatin  80 mg Oral q1800  . blood pressure control book   Does not apply Once  . clopidogrel  75 mg Oral Daily  . heparin  5,000 Units Subcutaneous Q8H  . metoprolol tartrate  25 mg Oral BID  . patiromer  25.2 g Oral Daily  . sodium bicarbonate  650 mg Oral TID   Arita Miss 04/23/2019

## 2019-04-23 NOTE — Progress Notes (Addendum)
[facility] PHYSICAL MEDICINE & REHABILITATION PROGRESS NOTE   Subjective/Complaints: Patient seen laying in bed this morning.  He indicates he did not sleep well overnight due to diarrhea.  He was seen by nephrology, notes reviewed.  ROS + diarrhea.  CP, shortness of breath, nausea, vomiting.   Objective:   No results found. No results for input(s): WBC, HGB, HCT, PLT in the last 72 hours. Recent Labs    04/22/19 1828 04/23/19 0759  NA 137 138  K 5.3* 5.6*  CL 95* 96*  CO2 26 30  GLUCOSE 121* 124*  BUN 88* 88*  CREATININE 4.74* 5.14*  CALCIUM 9.1 9.4    Intake/Output Summary (Last 24 hours) at 04/23/2019 1155 Last data filed at 04/23/2019 0730 Gross per 24 hour  Intake 372 ml  Output 725 ml  Net -353 ml     Physical Exam: Vital Signs Blood pressure (!) 138/55, pulse 72, temperature 98.8 F (37.1 C), temperature source Oral, resp. rate 16, height 5\' 4"  (1.626 m), weight 81.9 kg, SpO2 100 %. Constitutional: No distress . Vital signs reviewed. HENT: Normocephalic.  Atraumatic. Eyes: EOMI.  No discharge. Cardiovascular: No JVD. Respiratory: Normal effort. GI: Non-distended. Musc: No edema or tenderness in extremities. Neurologic: Alert Motor: LLE: 5/5 proximal distal RLE: 3-/5 proximal to distal  Expressive aphasia  Assessment/Plan: 1. 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  Physiatrist is providing close team supervision and 24 hour management of active medical problems listed below.  Physiatrist and rehab team continue to assess barriers to discharge/monitor patient progress toward functional and medical goals  Care Tool:  Bathing    Body parts bathed by patient: Right arm, Chest, Abdomen, Front perineal area, Left upper leg, Right upper leg, Face   Body parts bathed by helper: Buttocks, Left arm Body parts  n/a: Left lower leg, Right lower leg   Bathing assist Assist Level: Maximal Assistance - Patient 24 - 49%     Upper Body Dressing/Undressing Upper body dressing   What is the patient wearing?: Hospital gown only    Upper body assist Assist Level: Moderate Assistance - Patient 50 - 74%    Lower Body Dressing/Undressing Lower body dressing      What is the patient wearing?: Incontinence brief, Pants     Lower body assist Assist for lower body dressing: Maximal Assistance - Patient 25 - 49%     Toileting Toileting    Toileting assist Assist for toileting: Moderate Assistance - Patient 50 - 74%     Transfers Chair/bed transfer  Transfers assist     Chair/bed transfer assist level: Moderate Assistance - Patient 50 - 74%     Locomotion Ambulation   Ambulation assist   Ambulation activity did not occur: Safety/medical concerns          Walk 10 feet activity   Assist  Walk 10 feet activity did not occur: Safety/medical concerns        Walk 50 feet activity   Assist Walk 50 feet with 2 turns activity did not occur: Safety/medical concerns         Walk 150 feet activity   Assist Walk 150 feet activity did not occur: Safety/medical concerns         Walk 10 feet on uneven surface  activity   Assist Walk 10 feet on uneven surfaces activity did not occur: Safety/medical concerns  Wheelchair     Assist Will patient use wheelchair at discharge?: Yes Type of Wheelchair: Manual    Wheelchair assist level: Dependent - Patient 0% Max wheelchair distance: 150 ft    Wheelchair 50 feet with 2 turns activity    Assist        Assist Level: Dependent - Patient 0%   Wheelchair 150 feet activity     Assist     Assist Level: Dependent - Patient 0%    Medical Problem List and Plan: 1.Right hemiparesis and aphasia/dysphagiasecondary to left insular and posterior frontal infarct embolic secondary to non-STEMI. Status  post loop recorder  Continue CIR 2. Antithrombotics: -DVT/anticoagulation:Subcutaneous heparin. -antiplatelet therapy: Aspirin 325 mg daily, Plavix 75 mg daily 3. Pain Management:Tylenol as needed 4. Mood:Provide emotional support -antipsychotic agents: N/A 5. Neuropsych: This patientisnot fully capable of making decisions on hisown behalf. 6. Skin/Wound Care:Routine skin checks 7. Fluids/Electrolytes/Nutrition:Routine in and outs  Hyperkalemia, increased Veltassa per nephrology,    Potassium remains persistently elevated 5.6 on 5/3, meds adjusted 8.Dysphagia. Dysphagia #1 honey thick liquids.   Advanced to D1 thins, continue to advance as tolerated   Monitor for aspiration, also needs IVF for hydration  9.Acute on chronic kidney disease stage III/hyperkalemia with obstructive uropathy/BPH. Follow-up urology services as well as nephrology services. -FOLEY TUBE TO REMAIN IN PLACE UNTIL FOLLOW UP OUT PATIENT WITH UROLOGY  Creatinine 5.14 on 5/3  Diuretics being held by nephro  Recs per nephro 10.Hyperlipidemia. Lipitor 11.Hypertension. Lopressor 25 mg twice daily. Monitor with increased mobility Vitals:   04/23/19 0859 04/23/19 0941  BP: (!) 99/56 (!) 138/55  Pulse: (!) 55 72  Resp: 16   Temp:    SpO2:     Labile on 5/3, monitor for trend 12.OSA. Continue CPAP 13.  Acute blood loss anemia  Hemoglobin 9.5 on 4/29  Continue to monitor 14.  Loose stools  FiberCon added on 5/3, but immediately DC'd after discussion with nursing.  Patient did not have loose stool.  May be some confusion or limitation in communication on part of patient.   LOS: 5 days A FACE TO FACE EVALUATION WAS PERFORMED   Karis Juba 04/23/2019, 11:55 AM

## 2019-04-23 NOTE — Progress Notes (Signed)
Physical Therapy Session Note  Patient Details  Name: Gregory [name] MRN: [id number] Date of Birth: 1951-03-29  Today's Date: 04/23/2019 PT Individual Time: 0800-0910 PT Individual Time Calculation (min): 70 min   Short Term Goals: Week 1:  PT Short Term Goal 1 (Week 1): Pt will perform bed<>chair transfer with mod assist PT Short Term Goal 2 (Week 1): Pt will ambulate 10' w/ max assist w/ LRAD PT Short Term Goal 3 (Week 1): Pt will perform bed mobility with min assist  Skilled Therapeutic Interventions/Progress Updates:    Pt supine in bed upon PT arrival, agreeable to therapy tx and denies pain. Pt supine while therapist donned socks total assist. Supijne>sitting EOB with min assist and cues for techniques. Lateral scoot transfer to w/c with mod assist, transported to the gym. Pt performed lateral scoot to the mat with mod assist, cues for techniques. Pt worked on midline orientation and sitting balance this session with use of mirror for visual feedback while performing reaching activity, supervision-min assist. Pt with x 1 LOB this session to the R requiring max assist to correct. Pt performed x 3 sit<>stands this session with RW and max assist +2, mirror for visual feedback working on standing tolerance, midline orientation with cues for increased hip/trunk flexion. Pt only able to stand for 20-30 sec bouts before sitting back down and unable to take L UE off RW to perform standing reaching task. Pt performed sit<>stand from mat within stedy, mod assist. Sitting in high perch on the stedy seat pt begins to lean over and appears to be uncomfortable, pt nods yes when therapist asks if he feels lightheaded and needs to lay down. Pt assisted to supine on the mat with max assist +2. BP monitored in supine- 130/66, pt stayed in supine x 3 minutes before he reported feeling better. Pt assisted supine>sitting with mod assist. BP checked in sitting 99/56, appears to be orthostatic with symptoms. Therapist  communicated findings to RN Morrie Sheldon). Pt assisted to w/c lateral scoot mod assist and transported back to room. Lateral scoot back to bed mod assist and sit>supine max assist. Pt left supine in bed at end of session with needs in reach and bed alarm set.   Therapy Documentation Precautions:  Precautions Precautions: Fall Precaution Comments: Expressive aphasia, R hemiparesis Restrictions Weight Bearing Restrictions: No   Therapy/Group: Individual Therapy  Cresenciano Genre, PT, DPT 04/23/2019, 7:54 AM

## 2019-04-23 NOTE — Progress Notes (Signed)
Foley cath. Patent. Moderate, soft, incontinent clay colored stool. Using communication board to let staff know he needs to go to [facility]. Expressive aphasia. Alfredo Martinez A

## 2019-04-24 ENCOUNTER — Inpatient Hospital Stay (HOSPITAL_COMMUNITY): Payer: Medicare HMO | Admitting: Speech Pathology

## 2019-04-24 ENCOUNTER — Inpatient Hospital Stay (HOSPITAL_COMMUNITY): Payer: Medicare HMO | Admitting: Occupational Therapy

## 2019-04-24 ENCOUNTER — Inpatient Hospital Stay (HOSPITAL_COMMUNITY): Payer: Self-pay

## 2019-04-24 ENCOUNTER — Inpatient Hospital Stay (HOSPITAL_COMMUNITY): Payer: Medicare HMO | Admitting: Physical Therapy

## 2019-04-24 LAB — RENAL FUNCTION PANEL
Albumin: 2.9 g/dL — ABNORMAL LOW (ref 3.5–5.0)
Anion gap: 16 — ABNORMAL HIGH (ref 5–15)
BUN: 92 mg/dL — ABNORMAL HIGH (ref 8–23)
CO2: 27 mmol/L (ref 22–32)
Calcium: 9.3 mg/dL (ref 8.9–10.3)
Chloride: 92 mmol/L — ABNORMAL LOW (ref 98–111)
Creatinine, Ser: 5.49 mg/dL — ABNORMAL HIGH (ref 0.61–1.24)
GFR calc Af Amer: 11 mL/min — ABNORMAL LOW (ref >60)
GFR calc non Af Amer: 10 mL/min — ABNORMAL LOW (ref >60)
Glucose, Bld: 100 mg/dL — ABNORMAL HIGH (ref 70–99)
Phosphorus: 6 mg/dL — ABNORMAL HIGH (ref 2.5–4.6)
Potassium: 5.3 mmol/L — ABNORMAL HIGH (ref 3.5–5.1)
Sodium: 135 mmol/L (ref 135–145)

## 2019-04-24 LAB — URINALYSIS, ROUTINE W REFLEX MICROSCOPIC
Bilirubin Urine: NEGATIVE
Glucose, UA: NEGATIVE mg/dL
Ketones, ur: NEGATIVE mg/dL
Leukocytes,Ua: NEGATIVE
Nitrite: NEGATIVE
Protein, ur: NEGATIVE mg/dL
Specific Gravity, Urine: 1.013 (ref 1.005–1.030)
pH: 6 (ref 5.0–8.0)

## 2019-04-24 NOTE — Progress Notes (Signed)
Physical Therapy Session Note  Patient Details  Name: Gregory [name] MRN: [id number] Date of Birth: 1951-04-22  Today's Date: 04/24/2019 PT Individual Time: 0830-0858 PT Individual Time Calculation (min): 28 min   Short Term Goals: Week 1:  PT Short Term Goal 1 (Week 1): Pt will perform bed<>chair transfer with mod assist PT Short Term Goal 2 (Week 1): Pt will ambulate 10' w/ max assist w/ LRAD PT Short Term Goal 3 (Week 1): Pt will perform bed mobility with min assist  Skilled Therapeutic Interventions/Progress Updates:    pt rec'd in bed, agreeable to therapy.  Supine to sit iwht min A with use of bedrails.  Squat pivot transfer with mod/max A to Rt side, max/total A to position in chair due to delayed motor planning and processing, impaired coordination.  Pt performs sit <> stand in stedy with mod/max A.  Seated in stedy pt performs reaching task with focus on core strength and balance with supervision.  Pt left in w/c with alarm set, needs at hand.  Therapy Documentation Precautions:  Precautions Precautions: Fall Precaution Comments: Expressive aphasia, R hemiparesis Restrictions Weight Bearing Restrictions: No Pain:  no signs/symptoms of pain   Therapy/Group: Individual Therapy  DONAWERTH,KAREN 04/24/2019, 8:58 AM

## 2019-04-24 NOTE — Progress Notes (Signed)
Pt was complaining of LLQ pain. During palpation, small firm/hard round area noted. Increased pain with palpation. No firm area noted on the RLQ. Last BM 5/3. Pt indicated he did not need to use the bathroom when offered. Will continue to monitor.

## 2019-04-24 NOTE — Progress Notes (Signed)
Speech Language Pathology Daily Session Note  Patient Details  Name: Gregory [name] MRN: [id number] Date of Birth: 11-07-1951  Today's Date: 04/24/2019 SLP Individual Time: 1100-1200 SLP Individual Time Calculation (min): 60 min  Short Term Goals: Week 1: SLP Short Term Goal 1 (Week 1): Pt will tolerate PO diet (TBD after this week's MBS) without overt s/s aspiration given min A verbal and visual cues for use of safe swallow strategies. SLP Short Term Goal 2 (Week 1): Pt will follow two-step verbal directions with 80% accuracy. SLP Short Term Goal 3 (Week 1): Pt will successfully communicate name of object (blind to clinician) through use of augmentative communication resources on 7/10 trials.  SLP Short Term Goal 4 (Week 1): pt will produce 3-5 vowel sounds and or C-V combinations with 50% accuracy given max verbal/visual cues.  Skilled Therapeutic Interventions:  Skilled treatment session focused on dysphagia and communication goals. SLP facilitated session by providing skilled observation of pt consuming thin liquids via cup. Pt with 1 subtle throat clear but no other overt s/s of aspiration with consumption of 12 oz. SLP further facilitated session by creating low-tech communication sheet for pt. Pt able to gesture yes/no for picture he desired to have included on sheet. Additionally, pt able to select requested picture/action from board.   After session, SLP made aware by OT that pt had previously wanted to make a phone call but OT's session was over at time of request. SLP went back to pt's room and asked if he wanted to call [facility]. SLP facilitated by dialing her number and allowing Gregory [name] to provide information regarding bills to pt. SLP also spoke with Gregory [name] and asked her to bring in clothes for pt to wear such as jogging pants, loose fitting shirts and tie-up shoes. She stated that she would bring some to the main entrance. Pt left upright in wheelchair, lap belt alarm on and all needs  within reach. Continue per current plan of care.      Pain Pain Assessment Pain Scale: 0-10 Pain Score: 0-No pain  Therapy/Group: Individual Therapy  Gregory [name] 04/24/2019, 12:21 PM

## 2019-04-24 NOTE — Plan of Care (Signed)
  Problem: RH BOWEL ELIMINATION Goal: RH STG MANAGE BOWEL WITH ASSISTANCE Description STG Manage Bowel with Mod Assistance.  Outcome: Progressing   Problem: RH BLADDER ELIMINATION Goal: RH STG MANAGE BLADDER WITH EQUIPMENT WITH ASSISTANCE Description STG Manage Bladder With Equipment With Max Assistance  Outcome: Progressing   Problem: RH SKIN INTEGRITY Goal: RH STG SKIN FREE OF INFECTION/BREAKDOWN Description No new breakdown with min assist   Outcome: Progressing

## 2019-04-24 NOTE — Progress Notes (Signed)
[facility] NEPHROLOGY PROGRESS NOTE  Assessment/ Plan: Pt is a 68 y.o. yo male CVA, hypertension, CKD stage III with a bladder outlet obstruction and worsening renal failure.  #Acute kidney injury on CKD stage III likely due to obstructive uropathy: Patient has Foley catheter with urine output of 1.3 L.  No labs available from today, ordered renal panel.  Monitor BMP, avoid nephrotoxins.  Discontinue sodium bicarbonate.  Avoid nephrotoxins.  Diuretics on hold.  #Hyperkalemia: Currently on [place].  Repeat lab.  Recommend low potassium diet.  #Hypertension: Blood pressure on the lower side.  On metoprolol.  #Acute stroke with right-sided weakness/expressive aphasia currently in rehab.  Mild anemia   Subjective: Seen and examined at bedside.  Patient is alert awake and following commands.  Denies chest pain, shortness of breath.  He has expressive aphasia.  Has Foley catheter. Objective Vital signs in last 24 hours: Vitals:   04/23/19 2045 04/24/19 0502 04/24/19 1410 04/24/19 1541  BP: 125/71 125/64 119/68 (!) 100/47  Pulse: (!) 57 86 73 65  Resp: 19 19 19    Temp: 97.6 F (36.4 C) 97.6 F (36.4 C) 97.7 F (36.5 C)   TempSrc: Oral Oral Oral   SpO2: 99% 100% 95%   Weight:      Height:       Weight change:   Intake/Output Summary (Last 24 hours) at 04/24/2019 1611 Last data filed at 04/24/2019 1230 Gross per 24 hour  Intake 420 ml  Output 975 ml  Net -555 ml       Labs: Basic Metabolic Panel: Recent Labs  Lab 04/22/19 0738 04/22/19 1828 04/23/19 0759  NA 138 137 138  K 5.6* 5.3* 5.6*  CL 97* 95* 96*  CO2 28 26 30   GLUCOSE 102* 121* 124*  BUN 84* 88* 88*  CREATININE 4.57* 4.74* 5.14*  CALCIUM 9.0 9.1 9.4   Liver Function Tests: Recent Labs  Lab 04/18/19 0251 04/19/19 0532  AST 74* 62*  ALT 94* 82*  ALKPHOS 130* 141*  BILITOT 0.5 0.5  PROT 7.0 6.9  ALBUMIN 2.6* 2.7*   No results for input(s): LIPASE, AMYLASE in the last 168 hours. No  results for input(s): AMMONIA in the last 168 hours. CBC: Recent Labs  Lab 04/18/19 0251 04/19/19 0532  WBC 9.1 10.1  NEUTROABS  --  6.9  HGB 9.1* 9.5*  HCT 30.5* 31.6*  MCV 94.4 93.2  PLT 230 233   Cardiac Enzymes: No results for input(s): CKTOTAL, CKMB, CKMBINDEX, TROPONINI in the last 168 hours. CBG: No results for input(s): GLUCAP in the last 168 hours.  Iron Studies: No results for input(s): IRON, TIBC, TRANSFERRIN, FERRITIN in the last 72 hours. Studies/Results: No results found.  Medications: Infusions:   Scheduled Medications: . aspirin  300 mg Rectal Daily   Or  . aspirin  325 mg Oral Daily  . atorvastatin  80 mg Oral q1800  . blood pressure control book   Does not apply Once  . clopidogrel  75 mg Oral Daily  . heparin  5,000 Units Subcutaneous Q8H  . metoprolol tartrate  25 mg Oral BID  . patiromer  25.2 g Oral Daily  . sodium bicarbonate  650 mg Oral TID    have reviewed scheduled and prn medications.  Physical Exam: General:NAD, comfortable Heart:RRR, s1s2 nl Lungs:clear b/l, no crackle Abdomen:soft, Non-tender, non-distended Extremities:No edema Neurology: Expressive aphasia, nods and shakes mostly.  Jodine Muchmore Prasad Tamsen Reist 04/24/2019,4:11 PM  LOS: 6 days

## 2019-04-24 NOTE — Progress Notes (Signed)
Occupational Therapy Session Note  Patient Details  Name: Gregory [name] MRN: [id number] Date of Birth: 29-Jun-1951  Today's Date: 04/24/2019 OT Individual Time: 4585-9292 OT Individual Time Calculation (min): 60 min    Short Term Goals: Week 1:  OT Short Term Goal 1 (Week 1): Pt will complete LB dressing with max assist of one caregiver OT Short Term Goal 2 (Week 1): Pt will complete bathing with mod assist to demonstrate increased initiation and sequencing OT Short Term Goal 3 (Week 1): Pt will complete toilet transfer with mod assist. OT Short Term Goal 4 (Week 1): Pt will complete UB dressing with mod assist  Skilled Therapeutic Interventions/Progress Updates:    Pt completed shower during session.  He used the [facility] for transfer from the wheelchair to the shower tub bench with total assist.  Max assist for sit to stand onto the [facility] for transfer.  He needed mod instructional cueing for sequencing bathing with max hand over hand to wash the LUE with the RUE.  Noted increased flexor tone in the right elbow.  Max assist for donning his lower legs and feet as well.  Pt transferred out to the sink via Stedy again with total assist for donning new gown, brief, and gripper socks.  He needed mod assist for threading his brief over his feet with max assist for standing to pull them over his hips.  Total assist needed for donning gripper socks to finish session.  Call button and phone in reach with safety alarm in place.    Therapy Documentation Precautions:  Precautions Precautions: Fall Precaution Comments: Expressive aphasia, R hemiparesis Restrictions Weight Bearing Restrictions: No   Pain: Pain Assessment Pain Scale: 0-10 Pain Score: 0-No pain ADL: See Care Tool Section for some details of ADL  Therapy/Group: Individual Therapy  Tharon Kitch OTR/L 04/24/2019, 11:47 AM

## 2019-04-24 NOTE — Progress Notes (Signed)
Physical Therapy Session Note  Patient Details  Name: Gregory [name] MRN: [id number] Date of Birth: [birth year]  Today's Date: 04/24/2019 PT Individual Time: 1445-1555 PT Individual Time Calculation (min): 70 min   Short Term Goals: Week 1:  PT Short Term Goal 1 (Week 1): Pt will perform bed<>chair transfer with mod assist PT Short Term Goal 2 (Week 1): Pt will ambulate 10' w/ max assist w/ LRAD PT Short Term Goal 3 (Week 1): Pt will perform bed mobility with min assist  Skilled Therapeutic Interventions/Progress Updates:    Pt supine in bed upon PT arrival, agreeable to therapy tx and denies pain. Pt transferred to sitting EOB with min assist and performed mod assist squat pivot to w/c, cues for techniques. Pt transported to the gym, squat pivot to the mat with mod assist. Pt worked on sitting balance this session and increased L lateral weightshift to minimize R lateral lean while performing reaching task, CGA. Pt worked on sitting balance while reaching for objects on R side, emphasis on preventing R lateral LOB with cues to correct. BP checked in sitting this session- 125/67. Pt performed x 2 sit<>stands with RW and R hand splint, mod assist with cues for techniques, mirror used for visual feedback to correct R lateral lean in standing. BP following standing activity:100/47 with pt reporting symptoms in standing. Increased tone and spasticity noted in R LE this session. Pt transferred to supine with min assist, therapist performed R LE spasticity testing this session, according to modified Ashworth scale pt with , 2/5 for hip flexors, 3/5 for hamstrings and hip adductor musculature. Therapist performed R LE stretching this session x1 minute per stretch: hamstrings, adductors, and hip internal/external rotators. Pt transferred back to sitting with mod assist. Pt performed sit<>sidelying on elbow x 5 on each side working on core strength and postural control, emphasis on coming up to midline with  cues for techniques, min assist. Therapist donned teds this session. Pt performed sit<>stand with RW and mod assist, pt only able to maintain standing x 8 sec before sitting. BP following standing 104/74. squat pivot to w/c mod assist and transported back to room. Pt performed squat pivot to bed and transferred to supine mod assist. Pt left supine in bed with needs in reach and chair alarm set.   Therapy Documentation Precautions:  Precautions Precautions: Fall Precaution Comments: Expressive aphasia, R hemiparesis Restrictions Weight Bearing Restrictions: No    Therapy/Group: Individual Therapy  Cresenciano Genre, PT, DPT 04/24/2019, 7:55 AM

## 2019-04-24 NOTE — Progress Notes (Signed)
[facility] PHYSICAL MEDICINE & REHABILITATION PROGRESS NOTE   Subjective/Complaints:   No c/o , tolerating therapy   ROS + diarrhea.  CP, shortness of breath, nausea, vomiting.   Objective:   No results found. No results for input(s): WBC, HGB, HCT, PLT in the last 72 hours. Recent Labs    04/22/19 1828 04/23/19 0759  NA 137 138  K 5.3* 5.6*  CL 95* 96*  CO2 26 30  GLUCOSE 121* 124*  BUN 88* 88*  CREATININE 4.74* 5.14*  CALCIUM 9.1 9.4    Intake/Output Summary (Last 24 hours) at 04/24/2019 0930 Last data filed at 04/24/2019 0700 Gross per 24 hour  Intake 522 ml  Output 1325 ml  Net -803 ml     Physical Exam: Vital Signs Blood pressure 125/64, pulse 86, temperature 97.6 F (36.4 C), temperature source Oral, resp. rate 19, height 5\' 4"  (1.626 m), weight 81.9 kg, SpO2 100 %. Constitutional: No distress . Vital signs reviewed. HENT: Normocephalic.  Atraumatic. Eyes: EOMI.  No discharge. Cardiovascular: No JVD. Respiratory: Normal effort. GI: Non-distended. Musc: No edema or tenderness in extremities. Neurologic: Alert Motor: LLE: 5/5 proximal distal RLE: 3-/5 proximal to distal  Expressive aphasia  Assessment/Plan: 1. 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  Physiatrist is providing close team supervision and 24 hour management of active medical problems listed below.  Physiatrist and rehab team continue to assess barriers to discharge/monitor patient progress toward functional and medical goals  Care Tool:  Bathing    Body parts bathed by patient: Right arm, Chest, Abdomen, Front perineal area, Left upper leg, Right upper leg, Face   Body parts bathed by helper: Buttocks, Left arm Body parts n/a: Left lower leg, Right lower leg   Bathing assist Assist Level: Maximal Assistance - Patient 24 - 49%     Upper  Body Dressing/Undressing Upper body dressing   What is the patient wearing?: Hospital gown only    Upper body assist Assist Level: Moderate Assistance - Patient 50 - 74%    Lower Body Dressing/Undressing Lower body dressing      What is the patient wearing?: Incontinence brief, Pants     Lower body assist Assist for lower body dressing: Maximal Assistance - Patient 25 - 49%     Toileting Toileting    Toileting assist Assist for toileting: Moderate Assistance - Patient 50 - 74%     Transfers Chair/bed transfer  Transfers assist     Chair/bed transfer assist level: Moderate Assistance - Patient 50 - 74%     Locomotion Ambulation   Ambulation assist   Ambulation activity did not occur: Safety/medical concerns          Walk 10 feet activity   Assist  Walk 10 feet activity did not occur: Safety/medical concerns        Walk 50 feet activity   Assist Walk 50 feet with 2 turns activity did not occur: Safety/medical concerns         Walk 150 feet activity   Assist Walk 150 feet activity did not occur: Safety/medical concerns         Walk 10 feet on uneven surface  activity   Assist Walk 10 feet on uneven surfaces activity did not occur: Safety/medical concerns         Wheelchair     Assist Will patient use wheelchair at discharge?: Yes  Type of Wheelchair: Manual    Wheelchair assist level: Dependent - Patient 0% Max wheelchair distance: 150 ft    Wheelchair 50 feet with 2 turns activity    Assist        Assist Level: Dependent - Patient 0%   Wheelchair 150 feet activity     Assist     Assist Level: Dependent - Patient 0%    Medical Problem List and Plan: 1.Right hemiparesis and aphasia/dysphagiasecondary to left insular and posterior frontal infarct embolic secondary to non-STEMI. Status post loop recorder  Continue CIR PT, OT, SLP 2. Antithrombotics: -DVT/anticoagulation:Subcutaneous  heparin. -antiplatelet therapy: Aspirin 325 mg daily, Plavix 75 mg daily 3. Pain Management:Tylenol as needed 4. Mood:Provide emotional support -antipsychotic agents: N/A 5. Neuropsych: This patientisnot fully capable of making decisions on hisown behalf. 6. Skin/Wound Care:Routine skin checks 7. Fluids/Electrolytes/Nutrition:Routine in and outs  Hyperkalemia, increased Veltassa per nephrology,    Potassium remains persistently elevated 5.6 on 5/3, meds adjusted 8.Dysphagia. Dysphagia #1 honey thick liquids.   Advanced to D1 thins, continue to advance as tolerated   Monitor for aspiration, also needs IVF for hydration  9.Acute on chronic kidney disease stage III/hyperkalemia with obstructive uropathy/BPH. Follow-up urology services as well as nephrology services. -FOLEY TUBE TO REMAIN IN PLACE UNTIL FOLLOW UP OUT PATIENT WITH UROLOGY  Creatinine 5.14 on 5/3  Diuretics being held by nephro  Appreciate nephro assist 10.Hyperlipidemia. Lipitor 11.Hypertension. Lopressor 25 mg twice daily. Monitor with increased mobility Vitals:   04/23/19 2045 04/24/19 0502  BP: 125/71 125/64  Pulse: (!) 57 86  Resp: 19 19  Temp: 97.6 F (36.4 C) 97.6 F (36.4 C)  SpO2: 99% 100%   Labile on 5/4, monitor for trend 12.OSA. Continue CPAP 13.  Acute blood loss anemia  Hemoglobin 9.5 on 4/29  Continue to monitor 14.  Loose stools incont but not liquid  6 days A FACE TO FACE EVALUATION WAS PERFORMED  Erick Colace 04/24/2019, 9:30 AM

## 2019-04-24 NOTE — Progress Notes (Signed)
Pt has refused cpap at this time.  Device still in room at this time. RT will continue to monitor.

## 2019-04-25 ENCOUNTER — Inpatient Hospital Stay (HOSPITAL_COMMUNITY): Payer: Self-pay

## 2019-04-25 ENCOUNTER — Inpatient Hospital Stay (HOSPITAL_COMMUNITY): Payer: Medicare HMO | Admitting: Occupational Therapy

## 2019-04-25 ENCOUNTER — Inpatient Hospital Stay (HOSPITAL_COMMUNITY): Payer: Medicare HMO

## 2019-04-25 LAB — CBC
HCT: 33.6 % — ABNORMAL LOW (ref 39.0–52.0)
Hemoglobin: 10.2 g/dL — ABNORMAL LOW (ref 13.0–17.0)
MCH: 28.1 pg (ref 26.0–34.0)
MCHC: 30.4 g/dL (ref 30.0–36.0)
MCV: 92.6 fL (ref 80.0–100.0)
Platelets: 290 10*3/uL (ref 150–400)
RBC: 3.63 MIL/uL — ABNORMAL LOW (ref 4.22–5.81)
RDW: 13.3 % (ref 11.5–15.5)
WBC: 10.3 10*3/uL (ref 4.0–10.5)
nRBC: 0 % (ref 0.0–0.2)

## 2019-04-25 LAB — RENAL FUNCTION PANEL
Albumin: 2.9 g/dL — ABNORMAL LOW (ref 3.5–5.0)
Anion gap: 13 (ref 5–15)
BUN: 92 mg/dL — ABNORMAL HIGH (ref 8–23)
CO2: 28 mmol/L (ref 22–32)
Calcium: 9.4 mg/dL (ref 8.9–10.3)
Chloride: 95 mmol/L — ABNORMAL LOW (ref 98–111)
Creatinine, Ser: 5.22 mg/dL — ABNORMAL HIGH (ref 0.61–1.24)
GFR calc Af Amer: 12 mL/min — ABNORMAL LOW (ref >60)
GFR calc non Af Amer: 10 mL/min — ABNORMAL LOW (ref >60)
Glucose, Bld: 99 mg/dL (ref 70–99)
Phosphorus: 5.8 mg/dL — ABNORMAL HIGH (ref 2.5–4.6)
Potassium: 5.1 mmol/L (ref 3.5–5.1)
Sodium: 136 mmol/L (ref 135–145)

## 2019-04-25 MED ORDER — SODIUM CHLORIDE 0.9 % IV SOLN
INTRAVENOUS | Status: AC
  Administered 2019-04-25 – 2019-04-26 (×3): via INTRAVENOUS

## 2019-04-25 NOTE — Progress Notes (Signed)
Physical Therapy Session Note  Patient Details  Name: Gregory [name] MRN: [id number] Date of Birth: Feb 16, 1951  Today's Date: 04/25/2019 PT Individual Time: 1400-1515 PT Individual Time Calculation (min): 75 min   Short Term Goals: Week 1:  PT Short Term Goal 1 (Week 1): Pt will perform bed<>chair transfer with mod assist PT Short Term Goal 2 (Week 1): Pt will ambulate 10' w/ max assist w/ LRAD PT Short Term Goal 3 (Week 1): Pt will perform bed mobility with min assist  Skilled Therapeutic Interventions/Progress Updates:    Pt supine in bed asleep upon PT arrival, agreeable to therapy tx and R LE pain but unable to describe/rate. Pt transferred to sitting EOB with min assist, and reports not feeling well. Pt reports "yes" to lightheadedness. BP checked in sitting- 133/56. BP checked again in sitting after 3 min- 109/75. Pt asked if he has to use bathroom, pt nods yes. Pt performed sit<>stand within the stedy with mod assist, cues for techniques and increased trunk extension. Pt transported to toilet total assist using lift (stedy). Pt performed sit<>stands within stedy with mod assist, second person this session assisting with clothing management and peri-care. Pt transferred to w/c. Pt's BP checked in sitting- 112/59 and HR 78 bpm. Pt transported to the gym. Pt ambulated x 28 ft this session with max assist +2 (3 musketeers techniques) and also a third person for w/c follow and IV pole management. Pt transferred to mat with mod assist. Pt worked on sitting balance and weightbearing through R UE while performing reaching task with L UE while reaching over R side, second therapist positioning pts R UE in a weightbearing position on the mat. Pt worked on dynamic sitting balance without UE support, emphasis on limiting R LOB when reaching towards R side and then returning to midline. Pt transferred to supine with mod assist. Pt performed R LE exercises for neuro re-ed including 3 x 10 heel slides, cues  for techniques, decreased LE tone noted today compared to yesterday. Pt transferred back to sitting with mod assist without rails. Squat pivot to w/c with mod assist, transported back to room and transferred to bed mod assist. Sit>supine mod assist, pt left supine with needs in reach and bed alarm set.    Therapy Documentation Precautions:  Precautions Precautions: Fall Precaution Comments: Expressive aphasia, R hemiparesis Restrictions Weight Bearing Restrictions: No    Therapy/Group: Individual Therapy  Cresenciano Genre, PT, DPT 04/25/2019, 2:09 PM

## 2019-04-25 NOTE — Progress Notes (Signed)
Occupational Therapy Session Note  Patient Details  Name: Gregory [name] MRN: [id number] Date of Birth: 01/25/1951  Today's Date: 04/25/2019 OT Individual Time: 3903-0092 OT Individual Time Calculation (min): 84 min    Short Term Goals: Week 1:  OT Short Term Goal 1 (Week 1): Pt will complete LB dressing with max assist of one caregiver OT Short Term Goal 2 (Week 1): Pt will complete bathing with mod assist to demonstrate increased initiation and sequencing OT Short Term Goal 3 (Week 1): Pt will complete toilet transfer with mod assist. OT Short Term Goal 4 (Week 1): Pt will complete UB dressing with mod assist  Skilled Therapeutic Interventions/Progress Updates:    Pt completed bathing and dressing sit to stand at the sink.  Mod assist for squat pivot transfer to the right for OOB to wheelchair.  Once in the wheelchair he was positioned at the sink where he began working on shaving.  Mod assist for setup with pt completing 30% of shaving task with supervision but then needed mod assist to complete shaving the right side of his face and chin as well as for thoroughness.  Transitioned to bathing with max hand over hand assist to incorporate the RUE into task.  He needed overall min assist for UB bathing with mod assist for UB dressing following hemi techniques.  Max assist for LB bathing sit to stand with integration of a LH sponge and reacher for washing and drying his lower legs and feet.  He demonstrates increased flexor tone in both the elbow of the right arm as well as the hamstring of the left LE during standing.  Increased trunk flexion present as well in standing.  Max assist for donning LB clothing over his feet with integration of the reacher.  Decreased motor planning noted when attempting use of AE but will continue to integrate and practice.  Total assist for TEDs and gripper socks with pt left sitting in wheelchair with nursing present to start IV fluids.  Call button and phone in reach  with safety alarm belt in place.    Therapy Documentation Precautions:  Precautions Precautions: Fall Precaution Comments: Expressive aphasia, R hemiparesis Restrictions Weight Bearing Restrictions: No  Pain: Pain Assessment Pain Scale: Faces Pain Score: 0-No pain ADL: See Care Tool Section for some details of ADL  Therapy/Group: Individual Therapy  Lennis Rader OTR/L 04/25/2019, 12:12 PM

## 2019-04-25 NOTE — Progress Notes (Signed)
[facility] NEPHROLOGY PROGRESS NOTE  Assessment/ Plan: Pt is a 68 y.o. yo male CVA, hypertension, CKD stage III with a bladder outlet obstruction and worsening renal failure.  #Acute kidney injury on CKD stage III likely due to obstructive uropathy, nonoliguric: Patient has Foley catheter with urine output of 1.0 L.  Serum creatinine level is stable at 5.2 and potassium level 5.1.  He has poor oral intake and looks dry on physical exam.  I will start IV fluid for 24 hours.  Continue to monitor BMP, strict ins and out and avoid nephrotoxins. -Repeat UA on 5/4 normal.  #Hyperkalemia: Currently on Veltassa.  Monitor lab.  Recommend low potassium diet.  NS IV fluid should help potassium excretion.  #Hypertension: On metoprolol.  Monitor blood pressure  #Acute stroke with right-sided weakness/expressive aphasia currently in rehab.  Mild anemia   Subjective: Seen and examined at bedside.  Patient is alert awake and following commands.  Denies chest pain, shortness of breath.  He has expressive aphasia.  Has Foley catheter.  No new event. Objective Vital signs in last 24 hours: Vitals:   04/24/19 1541 04/24/19 2009 04/25/19 0454 04/25/19 1009  BP: (!) 100/47 (!) 142/75 111/60   Pulse: 65 (!) 53 94 78  Resp:  19 19   Temp:  98.4 F (36.9 C) 98.5 F (36.9 C)   TempSrc:      SpO2:  98% 100%   Weight:      Height:       Weight change:   Intake/Output Summary (Last 24 hours) at 04/25/2019 1059 Last data filed at 04/25/2019 0803 Gross per 24 hour  Intake 480 ml  Output 1000 ml  Net -520 ml       Labs: Basic Metabolic Panel: Recent Labs  Lab 04/23/19 0759 04/24/19 1613 04/25/19 0537  NA 138 135 136  K 5.6* 5.3* 5.1  CL 96* 92* 95*  CO2 30 27 28   GLUCOSE 124* 100* 99  BUN 88* 92* 92*  CREATININE 5.14* 5.49* 5.22*  CALCIUM 9.4 9.3 9.4  PHOS  --  6.0* 5.8*   Liver Function Tests: Recent Labs  Lab 04/19/19 0532 04/24/19 1613 04/25/19 0537  AST 62*  --    --   ALT 82*  --   --   ALKPHOS 141*  --   --   BILITOT 0.5  --   --   PROT 6.9  --   --   ALBUMIN 2.7* 2.9* 2.9*   No results for input(s): LIPASE, AMYLASE in the last 168 hours. No results for input(s): AMMONIA in the last 168 hours. CBC: Recent Labs  Lab 04/19/19 0532 04/25/19 0537  WBC 10.1 10.3  NEUTROABS 6.9  --   HGB 9.5* 10.2*  HCT 31.6* 33.6*  MCV 93.2 92.6  PLT 233 290   Cardiac Enzymes: No results for input(s): CKTOTAL, CKMB, CKMBINDEX, TROPONINI in the last 168 hours. CBG: No results for input(s): GLUCAP in the last 168 hours.  Iron Studies: No results for input(s): IRON, TIBC, TRANSFERRIN, FERRITIN in the last 72 hours. Studies/Results: No results found.  Medications: Infusions:   Scheduled Medications: . aspirin  300 mg Rectal Daily   Or  . aspirin  325 mg Oral Daily  . atorvastatin  80 mg Oral q1800  . blood pressure control book   Does not apply Once  . clopidogrel  75 mg Oral Daily  . heparin  5,000 Units Subcutaneous Q8H  . metoprolol tartrate  25 mg  Oral BID  . patiromer  25.2 g Oral Daily    have reviewed scheduled and prn medications.  Physical Exam: General:NAD, comfortable Heart:RRR, s1s2 nl, no rubs Lungs: Air bilateral, no crackle or wheeze Abdomen:soft, Non-tender, non-distended Extremities:No edema Neurology: Expressive aphasia, nods and shakes mostly. Foley catheter with clear urine  Myrakle Wingler Prasad Elkin Belfield 04/25/2019,10:59 AM  LOS: 7 days

## 2019-04-25 NOTE — Progress Notes (Signed)
Pt refuses use of cpap.  RT will dc order at this time and remove device. Rt will monitor.

## 2019-04-25 NOTE — Progress Notes (Signed)
[place] PHYSICAL MEDICINE & REHABILITATION PROGRESS NOTE   Subjective/Complaints:   Per RN on 5/4, pt has had LLQ although pt denies now. BM reported early this am ROS   CP, shortness of breath, nausea, vomiting.   Objective:   No results found. Recent Labs    04/25/19 0537  WBC 10.3  HGB 10.2*  HCT 33.6*  PLT 290   Recent Labs    04/24/19 1613 04/25/19 0537  NA 135 136  K 5.3* 5.1  CL 92* 95*  CO2 27 28  GLUCOSE 100* 99  BUN 92* 92*  CREATININE 5.49* 5.22*  CALCIUM 9.3 9.4    Intake/Output Summary (Last 24 hours) at 04/25/2019 0943 Last data filed at 04/25/2019 0803 Gross per 24 hour  Intake 480 ml  Output 1000 ml  Net -520 ml     Physical Exam: Vital Signs Blood pressure 111/60, pulse 94, temperature 98.5 F (36.9 C), resp. rate 19, height 5\' 4"  (1.626 m), weight 81.9 kg, SpO2 100 %. Constitutional: No distress . Vital signs reviewed. HENT: Normocephalic.  Atraumatic. Eyes: EOMI.  No discharge. Cardiovascular: No JVD. Respiratory: Normal effort. GI: Non-distended. Musc: No edema or tenderness in extremities. Neurologic: Alert Motor: LLE: 5/5 proximal distal RLE: 3-/5 proximal to distal  Expressive aphasia  Assessment/Plan: 1. 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  Physiatrist is providing close team supervision and 24 hour management of active medical problems listed below.  Physiatrist and rehab team continue to assess barriers to discharge/monitor patient progress toward functional and medical goals  Care Tool:  Bathing    Body parts bathed by patient: Right arm, Chest, Abdomen, Front perineal area, Left upper leg, Right upper leg, Face   Body parts bathed by helper: Buttocks, Front perineal area, Abdomen, Left arm Body parts n/a: Left lower leg, Right lower leg   Bathing assist Assist  Level: Maximal Assistance - Patient 24 - 49%     Upper Body Dressing/Undressing Upper body dressing   What is the patient wearing?: Hospital gown only    Upper body assist Assist Level: Maximal Assistance - Patient 25 - 49%    Lower Body Dressing/Undressing Lower body dressing      What is the patient wearing?: Incontinence brief     Lower body assist Assist for lower body dressing: Maximal Assistance - Patient 25 - 49%     Toileting Toileting    Toileting assist Assist for toileting: Moderate Assistance - Patient 50 - 74%     Transfers Chair/bed transfer  Transfers assist     Chair/bed transfer assist level: Moderate Assistance - Patient 50 - 74%     Locomotion Ambulation   Ambulation assist   Ambulation activity did not occur: Safety/medical concerns          Walk 10 feet activity   Assist  Walk 10 feet activity did not occur: Safety/medical concerns        Walk 50 feet activity   Assist Walk 50 feet with 2 turns activity did not occur: Safety/medical concerns         Walk 150 feet activity   Assist Walk 150 feet activity did not occur: Safety/medical concerns         Walk 10 feet on uneven surface  activity   Assist Walk 10 feet on uneven surfaces activity did not occur: Safety/medical concerns  Wheelchair     Assist Will patient use wheelchair at discharge?: Yes Type of Wheelchair: Manual    Wheelchair assist level: Dependent - Patient 0% Max wheelchair distance: 150 ft    Wheelchair 50 feet with 2 turns activity    Assist        Assist Level: Dependent - Patient 0%   Wheelchair 150 feet activity     Assist     Assist Level: Dependent - Patient 0%    Medical Problem List and Plan: 1.Right hemiparesis and aphasia/dysphagiasecondary to left insular and posterior frontal infarct embolic secondary to non-STEMI. Status post loop recorder  Continue CIR PT, OT, SLP 2.  Antithrombotics: -DVT/anticoagulation:Subcutaneous heparin. -antiplatelet therapy: Aspirin 325 mg daily, Plavix 75 mg daily 3. Pain Management:Tylenol as needed 4. Mood:Provide emotional support -antipsychotic agents: N/A 5. Neuropsych: This patientisnot fully capable of making decisions on hisown behalf. 6. Skin/Wound Care:Routine skin checks 7. Fluids/Electrolytes/Nutrition:Routine in and outs  Hyperkalemia, increased Veltassa per nephrology,    Potassium remains persistently elevated 5.6 on 5/3, meds adjusted 8.Dysphagia. Dysphagia #1 honey thick liquids.   Advanced to D1 thins, continue to advance as tolerated   Monitor for aspiration, also needs IVF for hydration  9.Acute on chronic kidney disease stage III/hyperkalemia with obstructive uropathy/BPH. Follow-up urology services as well as nephrology services. -FOLEY TUBE TO REMAIN IN PLACE UNTIL FOLLOW UP OUT PATIENT WITH UROLOGY  Creatinine 5.14 on 5/3  Diuretics being held by nephro  Appreciate nephro assist 10.Hyperlipidemia. Lipitor 11.Hypertension. Lopressor 25 mg twice daily. Monitor with increased mobility Vitals:   04/24/19 2009 04/25/19 0454  BP: (!) 142/75 111/60  Pulse: (!) 53 94  Resp: 19 19  Temp: 98.4 F (36.9 C) 98.5 F (36.9 C)  SpO2: 98% 100%   Labile on 5/4, monitor for trend 12.OSA. Continue CPAP 13.  Acute blood loss anemia  Hemoglobin 9.5 on 4/29  Continue to monitor 14.  Constipation alternating with loose stools likely laxative related veltassa may cause constipation, add colace low dose  7 days A FACE TO FACE EVALUATION WAS PERFORMED  Erick Colacendrew E  04/25/2019, 9:43 AM

## 2019-04-25 NOTE — Progress Notes (Signed)
Speech Language Pathology Daily Session Note  Patient Details  Name: Najm Cartner MRN: [id number] Date of Birth: Oct 09, 1951  Today's Date: 04/25/2019 SLP Individual Time: 0900-1000 SLP Individual Time Calculation (min): 60 min  Short Term Goals: Week 1: SLP Short Term Goal 1 (Week 1): Pt will tolerate PO diet (TBD after this week's MBS) without overt s/s aspiration given min A verbal and visual cues for use of safe swallow strategies. SLP Short Term Goal 2 (Week 1): Pt will follow two-step verbal directions with 80% accuracy. SLP Short Term Goal 3 (Week 1): Pt will successfully communicate name of object (blind to clinician) through use of augmentative communication resources on 7/10 trials.  SLP Short Term Goal 4 (Week 1): pt will produce 3-5 vowel sounds and or C-V combinations with 50% accuracy given max verbal/visual cues.  Skilled Therapeutic Interventions:Skilled ST services focused on speech and language skills. SLP facilitated comprehension of personalized communication board pt demonstrated ability to point to named picture in 11 out 14 opportunities given 3 trials. Pt often mixing up the symbols for '"rest", "pillow" and "bed" picture. SLP facilitated naming common objects from [facility] toolkit in responses to yes/no question with 100% accuracy. SLP facilitated instruction for how to communicate for other wants/needs not listed on communication with gestures and in response to yes/no questions (e.g. adjusting bed positions) , pt was inconsistent in the use of appropriate gestures upon request, however given spontaneous request pt's gestures required min A verbal cues for clarification with yes/no questions (e.g. reaching for glasses, pointing to top of bed.) SLP facilitated creating a list of TV channel preference in response to yes/no questions (listed on back of communication board.) SLP facilitated production of speech sounds on command, pt produced vowels /ah/ and /oh/ x10, with no success  imitating /ay/, /ee/, /I/ and /oo/. Pt produced /dah/ and /doh/ x10 on command and /bah/ in 2 out 10 trials and /boh/ in 1 out 10 trials. Pt was not stimuible of /m/ or /mah/ and /moh/ at this time.  Pt was left with call bell within reach and bed alarm set. SLP recommends to continue ST services.     Pain Pain Assessment Pain Scale: Faces Pain Score: 0-No pain  Therapy/Group: Individual Therapy  Tameah Mihalko  [facility] 04/25/2019, 12:37 PM

## 2019-04-25 NOTE — Plan of Care (Signed)
  Problem: Consults Goal: RH STROKE PATIENT EDUCATION Description See Patient Education module for education specifics  Outcome: Progressing   Problem: RH BLADDER ELIMINATION Goal: RH STG MANAGE BLADDER WITH EQUIPMENT WITH ASSISTANCE Description STG Manage Bladder With Equipment With Max Assistance  Outcome: Progressing   Problem: RH SKIN INTEGRITY Goal: RH STG SKIN FREE OF INFECTION/BREAKDOWN Description No new breakdown with min assist   Outcome: Progressing   Problem: RH SAFETY Goal: RH STG ADHERE TO SAFETY PRECAUTIONS W/ASSISTANCE/DEVICE Description STG Adhere to Safety Precautions With Min Assistance/Device.  Outcome: Progressing   Problem: RH COGNITION-NURSING Goal: RH STG USES MEMORY AIDS/STRATEGIES W/ASSIST TO PROBLEM SOLVE Description STG Uses Memory Aids/Strategies With Min Assistance to Problem Solve.  Outcome: Progressing   Problem: RH PAIN MANAGEMENT Goal: RH STG PAIN MANAGED AT OR BELOW PT'S PAIN GOAL Description < 3 out of 10.    Outcome: Progressing   Problem: RH BOWEL ELIMINATION Goal: RH STG MANAGE BOWEL WITH ASSISTANCE Description STG Manage Bowel with Mod Assistance.  Outcome: Not Progressing

## 2019-04-26 ENCOUNTER — Inpatient Hospital Stay (HOSPITAL_COMMUNITY): Payer: Self-pay

## 2019-04-26 ENCOUNTER — Inpatient Hospital Stay (HOSPITAL_COMMUNITY): Payer: Medicare HMO

## 2019-04-26 ENCOUNTER — Inpatient Hospital Stay (HOSPITAL_COMMUNITY): Payer: Medicare HMO | Admitting: Physical Therapy

## 2019-04-26 ENCOUNTER — Inpatient Hospital Stay (HOSPITAL_COMMUNITY): Payer: Medicare HMO | Admitting: Occupational Therapy

## 2019-04-26 LAB — RENAL FUNCTION PANEL
Albumin: 2.6 g/dL — ABNORMAL LOW (ref 3.5–5.0)
Anion gap: 16 — ABNORMAL HIGH (ref 5–15)
BUN: 86 mg/dL — ABNORMAL HIGH (ref 8–23)
CO2: 23 mmol/L (ref 22–32)
Calcium: 8.8 mg/dL — ABNORMAL LOW (ref 8.9–10.3)
Chloride: 97 mmol/L — ABNORMAL LOW (ref 98–111)
Creatinine, Ser: 4.72 mg/dL — ABNORMAL HIGH (ref 0.61–1.24)
GFR calc Af Amer: 14 mL/min — ABNORMAL LOW (ref >60)
GFR calc non Af Amer: 12 mL/min — ABNORMAL LOW (ref >60)
Glucose, Bld: 90 mg/dL (ref 70–99)
Phosphorus: 5 mg/dL — ABNORMAL HIGH (ref 2.5–4.6)
Potassium: 4.4 mmol/L (ref 3.5–5.1)
Sodium: 136 mmol/L (ref 135–145)

## 2019-04-26 NOTE — Progress Notes (Signed)
[facility] NEPHROLOGY PROGRESS NOTE  Assessment/ Plan: Pt is a 68 y.o. yo male CVA, hypertension, CKD stage III with a bladder outlet obstruction and worsening renal failure.  #Acute kidney injury on CKD stage III likely due to obstructive uropathy, nonoliguric: Patient has Foley catheter with urine output thousand 50 cc.  Serum creatinine level trending down to 4.7 today.  Completing 24 hours of IV fluid today. -Continue to monitor BMP, strict ins and out and avoid nephrotoxins. -Repeat UA on 5/4 normal.  #Hyperkalemia: Potassium level improved.  Currently on Veltassa.  Monitor lab.  Recommend low potassium diet.    #Hypertension: On metoprolol.  Monitor blood pressure  #Acute stroke with right-sided weakness/expressive aphasia currently in rehab.  Mild anemia   Subjective: Seen and examined at bedside.  No complaint.  No new event.  Objective Vital signs in last 24 hours: Vitals:   04/25/19 0454 04/25/19 1009 04/25/19 1955 04/26/19 0526  BP: 111/60  124/83 102/72  Pulse: 94 78 82 83  Resp: 19  14 18   Temp: 98.5 F (36.9 C)  97.9 F (36.6 C) (!) 97.5 F (36.4 C)  TempSrc:   Oral Oral  SpO2: 100%  100% 99%  Weight:      Height:       Weight change:   Intake/Output Summary (Last 24 hours) at 04/26/2019 1035 Last data filed at 04/26/2019 0942 Gross per 24 hour  Intake 1580 ml  Output 1050 ml  Net 530 ml       Labs: Basic Metabolic Panel: Recent Labs  Lab 04/24/19 1613 04/25/19 0537 04/26/19 0519  NA 135 136 136  K 5.3* 5.1 4.4  CL 92* 95* 97*  CO2 27 28 23   GLUCOSE 100* 99 90  BUN 92* 92* 86*  CREATININE 5.49* 5.22* 4.72*  CALCIUM 9.3 9.4 8.8*  PHOS 6.0* 5.8* 5.0*   Liver Function Tests: Recent Labs  Lab 04/24/19 1613 04/25/19 0537 04/26/19 0519  ALBUMIN 2.9* 2.9* 2.6*   No results for input(s): LIPASE, AMYLASE in the last 168 hours. No results for input(s): AMMONIA in the last 168 hours. CBC: Recent Labs  Lab 04/25/19 0537   WBC 10.3  HGB 10.2*  HCT 33.6*  MCV 92.6  PLT 290   Cardiac Enzymes: No results for input(s): CKTOTAL, CKMB, CKMBINDEX, TROPONINI in the last 168 hours. CBG: No results for input(s): GLUCAP in the last 168 hours.  Iron Studies: No results for input(s): IRON, TIBC, TRANSFERRIN, FERRITIN in the last 72 hours. Studies/Results: No results found.  Medications: Infusions: . sodium chloride 100 mL/hr at 04/26/19 0533    Scheduled Medications: . aspirin  300 mg Rectal Daily   Or  . aspirin  325 mg Oral Daily  . atorvastatin  80 mg Oral q1800  . blood pressure control book   Does not apply Once  . clopidogrel  75 mg Oral Daily  . heparin  5,000 Units Subcutaneous Q8H  . metoprolol tartrate  25 mg Oral BID  . patiromer  25.2 g Oral Daily    have reviewed scheduled and prn medications.  Physical Exam: General: Not in distress, comfortable Heart:RRR, s1s2 nl, no rubs Lungs: Bilateral, no wheezing or crackle. Abdomen:soft, Non-tender, non-distended Extremities:No edema Neurology: Expressive aphasia, nods and shakes mostly. Foley catheter with clear urine  Gregory [name] Gregory [name] 04/26/2019,10:35 AM  LOS: 8 days

## 2019-04-26 NOTE — Plan of Care (Signed)
  Problem: Consults Goal: RH STROKE PATIENT EDUCATION Description See Patient Education module for education specifics  Outcome: Progressing   Problem: RH BOWEL ELIMINATION Goal: RH STG MANAGE BOWEL WITH ASSISTANCE Description STG Manage Bowel with Mod Assistance.  Outcome: Progressing   Problem: RH BLADDER ELIMINATION Goal: RH STG MANAGE BLADDER WITH EQUIPMENT WITH ASSISTANCE Description STG Manage Bladder With Equipment With Max Assistance  Outcome: Progressing   Problem: RH SKIN INTEGRITY Goal: RH STG SKIN FREE OF INFECTION/BREAKDOWN Description No new breakdown with min assist   Outcome: Progressing   Problem: RH SAFETY Goal: RH STG ADHERE TO SAFETY PRECAUTIONS W/ASSISTANCE/DEVICE Description STG Adhere to Safety Precautions With Min Assistance/Device.  Outcome: Progressing   Problem: RH COGNITION-NURSING Goal: RH STG USES MEMORY AIDS/STRATEGIES W/ASSIST TO PROBLEM SOLVE Description STG Uses Memory Aids/Strategies With Min Assistance to Problem Solve.  Outcome: Progressing   Problem: RH PAIN MANAGEMENT Goal: RH STG PAIN MANAGED AT OR BELOW PT'S PAIN GOAL Description < 3 out of 10.    Outcome: Progressing   

## 2019-04-26 NOTE — Progress Notes (Signed)
Speech Language Pathology Daily Session Note  Patient Details  Name: Gregory [name] MRN: [id number] Date of Birth: Mar 08, 1951  Today's Date: 04/26/2019 SLP Individual Time: 0900-0958 SLP Individual Time Calculation (min): 58 min  Short Term Goals: Week 1: SLP Short Term Goal 1 (Week 1): Pt will tolerate PO diet (TBD after this week's MBS) without overt s/s aspiration given min A verbal and visual cues for use of safe swallow strategies. SLP Short Term Goal 2 (Week 1): Pt will follow two-step verbal directions with 80% accuracy. SLP Short Term Goal 3 (Week 1): Pt will successfully communicate name of object (blind to clinician) through use of augmentative communication resources on 7/10 trials.  SLP Short Term Goal 4 (Week 1): pt will produce 3-5 vowel sounds and or C-V combinations with 50% accuracy given max verbal/visual cues.  Skilled Therapeutic Interventions:    Skilled ST services focused on swallow and language skills. SLP facilitated PO consumption of breakfast tray, dys 1 and thin via cup, pt demonstrated appropriate oral clearance, mld interior spillage of solids and no overt s/s aspiration. SLP reduced supervision level to intermittent supervision A. Pt indicated he did not like the food options, via gestures and response to yes/no questions, consuming only the pancake and drink. SLP assisted pt in creating a diet preference list, in response to yes/no questions and posted sign outside door. SLP facilitated snack trial of dys 2, pt demonstrated mild right pocketing and mild anterior spillage with ability to clear oral cavity with min A verbal cues for lingual sweeps and no overt s/s aspiration. SLP recommends continuing dys 2 trials due to the appearence of great oral control and to increase PO intake. SLP facilitated following 2 step directions utilizing objects on tray pt demonstrated accuracy in 3 out 5 opportunities, noting missed opportunities with slightly more complex language (  turn over vs. Point/pick up.) SLP facilitated comprehension of communication board, pt able to receptively identify 13 out 14 pictures. Pt demonstrated communication barrier when attempting to express something about the communication board being placed in his draw, but SLP was unable to assist in expression of message,suggesting the need to continue expansion of pt's augment communication system. Pt expressed "spri" for sprite and "no" during communication exchanges. Pt was left in room with call bell within reach and chair alarm set. ST recommends to continue skilled ST services.   Pain Pain Assessment Pain Scale: Faces Pain Score: 0-No pain Faces Pain Scale: No hurt  Therapy/Group: Individual Therapy  Jamacia Jester  [facility] 04/26/2019, 12:33 PM

## 2019-04-26 NOTE — Progress Notes (Signed)
Physical Therapy Session Note  Patient Details  Name: Gregory [name] MRN: [id number] Date of Birth: 08/29/1951  Today's Date: 04/26/2019 PT Individual Time: 1100-1145 PT Individual Time Calculation (min): 45 min   Short Term Goals: Week 2:  PT Short Term Goal 1 (Week 2): Pt will perform sit<>stand with RW and min assist PT Short Term Goal 2 (Week 2): Pt will perform bed<>chair transfer with min assist PT Short Term Goal 3 (Week 2): Pt will ambulated x 10 ft with max assist and LRAD  Skilled Therapeutic Interventions/Progress Updates:  Pt presented in w/c agreeable to therapy. Pt denies pain at beginning of session. Pt transported to rehab gym and performed squat pivot to mat modA. Pt performed STS with RW modA with cues for hand placement. Pt required mod/max verbal cues for increasing erect posture and improving anterior translation of hips. Pt abe to maintain standing for approx 10 sec then return to sitting. On 3rd stand pt pointing at RLE (shin) indicating pain. Performed ROM at knee followed by stretching of HS and gastrocs. Pt able to indicate increased pain with increased DF. Primary PT notified and remaining session focused on sitting balance and maintaining midline while sitting. Pt performed reaching with LUE to R to place cards with emphasis on reaching forward. Pt initially requiring modA for forward reach and tactile cues to reach full midline however was able to progress to minA with improved reach and full return to midline . Pt was able to correctly place 7/9 cards on board without assist. Pt returned to w/c modA squat pivot and transferred back to room. Pt agreeable to remain in w/c at end of session as lunch tray to arrive shortly. Pt left in w/c with bed alarm on, call bell within reach and needs met.       Therapy Documentation Precautions:  Precautions Precautions: Fall Precaution Comments: Expressive aphasia, R hemiparesis Restrictions Weight Bearing Restrictions:  No General:   Vital Signs:  Pain: Pain Assessment Pain Score: 0-No pain   Therapy/Group: Individual Therapy  Dejour Vos  Athel Merriweather, PTA  04/26/2019, 1:26 PM

## 2019-04-26 NOTE — Progress Notes (Signed)
Occupational Therapy Weekly Progress Note  Patient Details  Name: Gregory [name] MRN: [id number] Date of Birth: [birth year]  Beginning of progress report period: April 19, 2019 End of progress report period: Apr 26, 2019  Today's Date: 04/26/2019 OT Individual Time: 0800-0900 OT Individual Time Calculation (min): 60 min    Patient has met 2 of 4 short term goals.  Pt completes UB selfcare at a min assist level for bathing and max assist level for dressing.  He needs max assist for LB bathing and dressing sit to stand.  RUE tone continues to be present in the elbow, requiring max assist for hand over hand assistance to use the RUE to wash the LUE.  He continues to need max assist for sit to stand with LB selfcare, maintaining flexed trunk in standing.  Noted tone also is increased in the right hamstrings and adductors.  Mod to max assist is also needed for squat pivot transfers to the [facility] or 3:1.  Motor planning is also present with difficulty sequencing through setup of his washcloth and for sequencing hemidressing techniques.  Have begun trying AE as well for LB selfcare as he lacks flexibility to reach his feet for bathing and dressing tasks.  Recommend continued OT at this time to progress to established LTGs set at overall min assist level.    Patient continues to demonstrate the following deficits: muscle weakness, impaired timing and sequencing, abnormal tone, unbalanced muscle activation, motor apraxia and decreased coordination, decreased motor planning, decreased attention, decreased awareness and decreased memory and decreased sitting balance, decreased standing balance, decreased postural control, hemiplegia and decreased balance strategies and therefore will continue to benefit from skilled OT intervention to enhance overall performance with BADL and Reduce care partner burden.  Patient progressing toward long term goals..  Continue plan of care.  OT Short Term Goals Week 2:  OT Short  Term Goal 1 (Week 2): Pt will complete toilet transfer with mod assist. OT Short Term Goal 2 (Week 2): Pt will complete UB dressing with mod assist OT Short Term Goal 3 (Week 2): Pt will complete LB bathing with mod assist sit to stand.   OT Short Term Goal 4 (Week 2): Pt will complete LB dressing with mod assist for pulling pants over hips.    Skilled Therapeutic Interventions/Progress Updates:    Pt completed bathing and dressing during session.  Max assist for transfer to the wheelchair squat pivot to the right.  Once in the wheelchair, he worked on bathing and dressing.  Min assist for UB bathing with max assist for LB bathing sit to stand.  Max assist for donning pullover shirt and for donning pants, TEDs, and gripper socks.  He needed max hand over hand for integration of the RUE into bathing task.  Increased flexor tone was noted in the right elbow as well with some increased tone in the hamstring on the right side as well.  Finished session with pt in the wheelchair and safety alarm in place.  Nursing in to administer meds.    Therapy Documentation Precautions:  Precautions Precautions: Fall Precaution Comments: Expressive aphasia, R hemiparesis Restrictions Weight Bearing Restrictions: No  Pain: Pain Assessment Pain Scale: Faces Pain Score: 0-No pain ADL: See Care Tool Section for some details of ADL  Therapy/Group: Individual Therapy  Latoria Dry OTR/L 04/26/2019, 11:13 AM

## 2019-04-26 NOTE — Progress Notes (Signed)
Physical Therapy Weekly Progress Note  Patient Details  Name: Gregory [name] MRN: [id number] Date of Birth: March 12, 1951  Beginning of progress report period: April 19, 2019 End of progress report period: Apr 26, 2019  Today's Date: 04/26/2019 PT Individual Time: 6767-2094 PT Individual Time Calculation (min): 72 min   Patient has met 1 of 3 short term goals.  Pt has made progress with standing tolerance and transfers however is unable to progress gait secondary to R LE tone and pain. Pt continues to demonstrate poor balance reactions on R side with continued LOB towards the right in both sitting and standing.   Patient continues to demonstrate the following deficits muscle weakness, impaired timing and sequencing, abnormal tone, unbalanced muscle activation and decreased coordination and decreased standing balance, decreased postural control, hemiplegia and decreased balance strategies and therefore will continue to benefit from skilled PT intervention to increase functional independence with mobility.  Patient progressing toward long term goals..  Continue plan of care.  PT Short Term Goals Week 1:  PT Short Term Goal 1 (Week 1): Pt will perform bed<>chair transfer with mod assist PT Short Term Goal 1 - Progress (Week 1): Met PT Short Term Goal 2 (Week 1): Pt will ambulate 10' w/ max assist w/ LRAD PT Short Term Goal 2 - Progress (Week 1): Progressing toward goal PT Short Term Goal 3 (Week 1): Pt will perform bed mobility with min assist PT Short Term Goal 3 - Progress (Week 1): Progressing toward goal Week 2:  PT Short Term Goal 1 (Week 2): Pt will perform sit<>stand with RW and min assist PT Short Term Goal 2 (Week 2): Pt will perform bed<>chair transfer with min assist PT Short Term Goal 3 (Week 2): Pt will ambulated x 10 ft with max assist and LRAD  Skilled Therapeutic Interventions/Progress Updates:    Pt supine in bed upon PT arrival, agreeable to therapy tx and denies pain. Pt  transferred to sitting EOB with min assist and performed stand pivot to w/c with mod assist. Pt transported to gym. Squat pivot to the mat with mod and cues for techniques. Pt performed x 3 sit<>stands this session with RW and mod assist, mirror for visual feedback, working on standing tolerance, R LE weightbearing for tone management and midline orientation. Pt ambulated x 20 ft this session with max assist +2, 3 musketeers techniques, increased R LE adductor tone noted this session compared to yesterday, with increased R LE scissoring and increased assist needed for R LE foot placement. Pt transferred to mat mod assist, squat pivot. The remainder of the session focused on sitting balance and midline orientation. Pt seated edge of mat worked on over correcting R lateral lean with therapist using theraband to pull pt to the R, x 2 trials with mirror for visual feedback and cues for techniques. Pt worked on reaching outside Shorter in both directions, picking up objects from floor and reaching forward with B UEs (therapist assisting R UE) while performing various reaching tasks. Therapist applied manual perturbations using therapist to provoke R lateral LOB, pt able to maintain sitting balance throughout. Pt performed squat pivot to w/c mod assist and transported back to room.   Therapy Documentation Precautions:  Precautions Precautions: Fall Precaution Comments: Expressive aphasia, R hemiparesis Restrictions Weight Bearing Restrictions: No   Therapy/Group: Individual Therapy  Netta Corrigan, PT, DPT 04/26/2019, 7:57 AM

## 2019-04-26 NOTE — Patient Care Conference (Signed)
Inpatient RehabilitationTeam Conference and Plan of Care Update Date: 04/26/2019   Time: 11:25 AM    Patient Name: Gregory [name]      Medical Record Number: [id number]  Date of Birth: 1951/08/24 Sex: Male         Room/Bed: 4W24C/4W24C-01 Payor Info: Payor: [employer] MEDICARE / Plan: [employer] MEDICARE HMO / Product Type: *No Product type* /    Admitting Diagnosis: cva  Admit Date/Time:  04/18/2019  4:32 PM Admission Comments: No comment available   Primary Diagnosis:  <principal problem not specified> Principal Problem: <principal problem not specified>  Patient Active Problem List   Diagnosis Date Noted  . Loose stools   . Labile blood pressure   . Aphasia   . Acute blood loss anemia   . AKI (acute kidney injury) (HCC)   . CKD (chronic kidney disease), stage III (HCC)   . Dysphagia, post-stroke   . 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 Hyperkalemia 04/14/2019  . 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 Cerebral infarction due to embolism of left middle cerebral artery (HCC)   . Stroke (cerebrum) (HCC) 04/05/2019  . Demand ischemia (HCC) 04/05/2019  . 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 Pre-diabetes 09/05/2018  . Acute renal failure superimposed on stage 3 chronic kidney disease (HCC) 09/05/2018  . Bilateral carotid artery stenosis 09/05/2018  . Essential hypertension 09/05/2018  . TIA (transient ischemic attack) 09/04/2018    Expected Discharge Date: Expected Discharge Date: 05/17/19  Team Members Present: Physician leading conference: Dr. Claudette LawsAndrew Kirsteins Social Worker Present: Dossie DerBecky Bravlio Luca, LCSW Nurse Present: Otilio CarpenMekides Nida, LPN PT Present: Woodfin GanjaEmily Van Shagen, PT OT Present: Perrin MalteseJames McGuire, OT SLP Present: Colin BentonMadison Cratch, SLP PPS Coordinator present : Fae PippinMelissa Bowie     Current Status/Progress Goal Weekly Team Focus  Medical   Hyper K improved, Incont bowel and bladder , severe aphasia  improve communication , B  adn Bladder cont, improve tone  address bowel continenece , may need fiber   Bowel/Bladder   Foley Catheter chronic going home with, Incontnent of Bowel LBM 05/05  foley care q shift   assess needs and assist with toileting qshift and prn   Swallow/Nutrition/ Hydration   dys 1 and Thin, intermittent sup A  Mod I  dys 2 trials and carryover of swallow startegies oral clearance   ADL's   Min assist for UB bathing with max assist for UB dressing.  Max assist for LB bathing and dressing sit to stand.  Mod to max assist for squat pivot transfers.  Increased flexor tone in the right elbow with Brunnstrum stage III in the elbow  Min assist  selfcare retraining, transfer training, neuromuscular re-education, balance retraining, therapeutic activities, pt/family education   Mobility   min-mod assist bed mobility, mod assist transfers and sit<>stand, +2 for gait x 30 ft  min assist  transfers, gait, standing tolerance, tone management, balance   Communication   Mod-Min A multimodal, total-Max A speech and Min A mildly complex auditory comprehension  Min A multimodal, Max A speech and Sup A complex Auditory comprehension  use of communication board, wants/needs yes/no, follow 2 step directions, V and CV production and assess writting/reading level   Safety/Cognition/ Behavioral Observations            Pain   denies pain  free of pain  assess pain qshift and prn   Skin   feet dry flaky bruises to bilateral lower abdomen  remain intact  assess skin qshift and prn      *See Care Plan and progress notes for long and short-term goals.     Barriers to Discharge  Current Status/Progress Possible Resolutions Date Resolved   Physician    Medical stability;Incontinence     slow progress  Nephro , RIght ankle xray for pain      Nursing                  PT                    OT                  SLP                SW                Discharge Planning/Teaching Needs:  Home with ex-wife and  their children to assist, unsure if ex-wife will provide tolieting care. Son confirms will provide 24 hr physical care.      Team Discussion:  Goals min-mod assist level, slow progress in therapeis due to tone in arm and hamstring, limits him in therapies. Currently max assist level. Trials of Dys 2 currently Dys 1 honey thick liquids, IV's at night for hydration. BP controlled.Foley for home see Urologist as OP and incontinent of bowel-nursing working on.   Revisions to Treatment Plan:  DC 5/27    Continued Need for Acute Rehabilitation Level of Care: The patient requires daily medical management by a physician with specialized training in physical medicine and rehabilitation for the following conditions: Daily direction of a multidisciplinary physical rehabilitation program to ensure safe treatment while eliciting the highest outcome that is of practical value to the patient.: Yes Daily medical management of patient stability for increased activity during participation in an intensive rehabilitation regime.: Yes Daily analysis of laboratory values and/or radiology reports with any subsequent need for medication adjustment of medical intervention for : Neurological problems;Mood/behavior problems;Other   I attest that I was present, lead the team conference, and concur with the assessment and plan of the team. Teleconference held due to COVID-19   Lucy Chris 04/26/2019, 11:38 AM

## 2019-04-26 NOTE — Progress Notes (Signed)
[facility] PHYSICAL MEDICINE & REHABILITATION PROGRESS NOTE   Subjective/Complaints:  Remains severely aphasic.  He is working with speech therapy.  He is trying to express something.  We discussed the process of setting discharge date during team conference as well as notifying family.  He was nodding his head about this and smiling.  Discussed with nephro , Dr Carolin Sicks ROS   CP, shortness of breath, nausea, vomiting.   Objective:   No results found. Recent Labs    04/25/19 0537  WBC 10.3  HGB 10.2*  HCT 33.6*  PLT 290   Recent Labs    04/25/19 0537 04/26/19 0519  NA 136 136  K 5.1 4.4  CL 95* 97*  CO2 28 23  GLUCOSE 99 90  BUN 92* 86*  CREATININE 5.22* 4.72*  CALCIUM 9.4 8.8*    Intake/Output Summary (Last 24 hours) at 04/26/2019 0946 Last data filed at 04/26/2019 0942 Gross per 24 hour  Intake 1580 ml  Output 1050 ml  Net 530 ml     Physical Exam: Vital Signs Blood pressure 102/72, pulse 83, temperature (!) 97.5 F (36.4 C), temperature source Oral, resp. rate 18, height '5\' 4"'$  (1.626 m), weight 81.9 kg, SpO2 99 %. Constitutional: No distress . Vital signs reviewed. HENT: Normocephalic.  Atraumatic. Eyes: EOMI.  No discharge. Cardiovascular: No JVD. Respiratory: Normal effort. GI: Non-distended. Musc: No edema or tenderness in extremities. Neurologic: Alert Motor: LLE: 5/5 proximal distal RLE: 3-/5 proximal to distal  Expressive aphasia  Assessment/Plan: 1. 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  Physiatrist is providing close team supervision and 24 hour management of active medical problems listed below.  Physiatrist and rehab team continue to assess barriers to discharge/monitor patient progress toward functional and medical goals  Care Tool:  Bathing    Body parts bathed by patient: Right arm,  Chest, Abdomen, Front perineal area, Left upper leg, Right upper leg, Face, Right lower leg, Left lower leg   Body parts bathed by helper: Left arm, Buttocks Body parts n/a: Left lower leg, Right lower leg   Bathing assist Assist Level: Maximal Assistance - Patient 24 - 49%     Upper Body Dressing/Undressing Upper body dressing   What is the patient wearing?: Pull over shirt    Upper body assist Assist Level: Maximal Assistance - Patient 25 - 49%    Lower Body Dressing/Undressing Lower body dressing      What is the patient wearing?: Incontinence brief, Pants     Lower body assist Assist for lower body dressing: Maximal Assistance - Patient 25 - 49%     Toileting Toileting    Toileting assist Assist for toileting: Moderate Assistance - Patient 50 - 74%     Transfers Chair/bed transfer  Transfers assist     Chair/bed transfer assist level: Moderate Assistance - Patient 50 - 74%     Locomotion Ambulation   Ambulation assist   Ambulation activity did not occur: Safety/medical concerns  Assist level: 2 helpers Assistive device: Other (comment)(3 musketeers) Max distance: 28 ft   Walk 10 feet activity   Assist  Walk 10 feet activity did not occur: Safety/medical concerns  Assist level: 2 helpers Assistive device: Other (comment)   Walk 50 feet activity   Assist Walk 50 feet with 2 turns activity did not occur: Safety/medical concerns  Walk 150 feet activity   Assist Walk 150 feet activity did not occur: Safety/medical concerns         Walk 10 feet on uneven surface  activity   Assist Walk 10 feet on uneven surfaces activity did not occur: Safety/medical concerns         Wheelchair     Assist Will patient use wheelchair at discharge?: Yes Type of Wheelchair: Manual    Wheelchair assist level: Dependent - Patient 0% Max wheelchair distance: 150 ft    Wheelchair 50 feet with 2 turns activity    Assist         Assist Level: Dependent - Patient 0%   Wheelchair 150 feet activity     Assist     Assist Level: Dependent - Patient 0%    Medical Problem List and Plan: 1.Right hemiparesis and aphasia/dysphagiasecondary to left insular and posterior frontal infarct embolic secondary to non-STEMI. Status post loop recorder  Continue CIR PT, OT, SLP, Team conference today please see physician documentation under team conference tab, met with team face-to-face to discuss problems,progress, and goals. Formulized individual treatment plan based on medical history, underlying problem and comorbidities. 2. Antithrombotics: -DVT/anticoagulation:Subcutaneous heparin. -antiplatelet therapy: Aspirin 325 mg daily, Plavix 75 mg daily 3. Pain Management:Tylenol as needed 4. Mood:Provide emotional support -antipsychotic agents: N/A 5. Neuropsych: This patientisnot fully capable of making decisions on hisown behalf. 6. Skin/Wound Care:Routine skin checks 7. Fluids/Electrolytes/Nutrition:Routine in and outs  Hyperkalemia, increased [place] per nephrology,    Potassium remains persistently elevated 5.6 on 5/3, meds adjusted 8.Dysphagia. Dysphagia #1 honey thick liquids.   Advanced to D1 thins, continue to advance as tolerated   Monitor for aspiration, also needs IVF for hydration  9.Acute on chronic kidney disease stage III/hyperkalemia with obstructive uropathy/BPH. Follow-up urology services as well as nephrology services. -FOLEY TUBE TO REMAIN IN PLACE UNTIL FOLLOW UP OUT PATIENT WITH UROLOGY  Creatinine 5.14 on 5/3  Diuretics being held by nephro  Appreciate nephro assist 10.Hyperlipidemia. Lipitor 11.Hypertension. Lopressor 25 mg twice daily. Monitor with increased mobility Vitals:   04/25/19 1955 04/26/19 0526  BP: 124/83 102/72  Pulse: 82 83  Resp: 14 18  Temp: 97.9 F (36.6 C) (!) 97.5 F (36.4 C)  SpO2: 100% 99%   Labile on  5/6, monitor for trend 12.OSA. Continue CPAP 13.  Acute blood loss anemia  Hemoglobin 9.5 on 4/29  Continue to monitor 14.  Constipation alternating with loose stools stools have been loose on 5/4 not recorded on 5 /5 or 5/ 6  8 days A FACE TO FACE EVALUATION WAS PERFORMED  Charlett Blake 04/26/2019, 9:46 AM

## 2019-04-27 ENCOUNTER — Inpatient Hospital Stay (HOSPITAL_COMMUNITY): Payer: Medicare HMO | Admitting: Occupational Therapy

## 2019-04-27 ENCOUNTER — Inpatient Hospital Stay (HOSPITAL_COMMUNITY): Payer: Medicare HMO | Admitting: Speech Pathology

## 2019-04-27 ENCOUNTER — Inpatient Hospital Stay (HOSPITAL_COMMUNITY): Payer: Self-pay | Admitting: Physical Therapy

## 2019-04-27 LAB — RENAL FUNCTION PANEL
Albumin: 2.7 g/dL — ABNORMAL LOW (ref 3.5–5.0)
Anion gap: 12 (ref 5–15)
BUN: 79 mg/dL — ABNORMAL HIGH (ref 8–23)
CO2: 23 mmol/L (ref 22–32)
Calcium: 8.9 mg/dL (ref 8.9–10.3)
Chloride: 100 mmol/L (ref 98–111)
Creatinine, Ser: 4.11 mg/dL — ABNORMAL HIGH (ref 0.61–1.24)
GFR calc Af Amer: 16 mL/min — ABNORMAL LOW (ref >60)
GFR calc non Af Amer: 14 mL/min — ABNORMAL LOW (ref >60)
Glucose, Bld: 102 mg/dL — ABNORMAL HIGH (ref 70–99)
Phosphorus: 4.7 mg/dL — ABNORMAL HIGH (ref 2.5–4.6)
Potassium: 4.4 mmol/L (ref 3.5–5.1)
Sodium: 135 mmol/L (ref 135–145)

## 2019-04-27 NOTE — Progress Notes (Signed)
Speech Language Pathology Weekly Progress and Session Note  Patient Details  Name: Gregory [name] MRN: [id number] Date of Birth: [birth year]  Beginning of progress report period: April 19, 2019 End of progress report period: Apr 27, 2019  Today's Date: 04/27/2019 SLP Individual Time: 5277-8242 SLP Individual Time Calculation (min): 60 min  Short Term Goals: Week 1: SLP Short Term Goal 1 (Week 1): Pt will tolerate PO diet (TBD after this week's MBS) without overt s/s aspiration given min A verbal and visual cues for use of safe swallow strategies. SLP Short Term Goal 1 - Progress (Week 1): Met SLP Short Term Goal 2 (Week 1): Pt will follow two-step verbal directions with 80% accuracy. SLP Short Term Goal 2 - Progress (Week 1): Met SLP Short Term Goal 3 (Week 1): Pt will successfully communicate name of object (blind to clinician) through use of augmentative communication resources on 7/10 trials.  SLP Short Term Goal 3 - Progress (Week 1): Met SLP Short Term Goal 4 (Week 1): pt will produce 3-5 vowel sounds and or C-V combinations with 50% accuracy given max verbal/visual cues. SLP Short Term Goal 4 - Progress (Week 1): Discontinued (duplicate) SLP Short Term Goal 5 (Week 1): Pt will produce 3-5 vowel sounds and/or CV syllables with 50% accuracy given maxA verbal and visual cues. SLP Short Term Goal 5 - Progress (Week 1): Not met SLP Short Term Goal 6 (Week 1): Pt will demonstrate comprehension of phrase/sentence length written information. SLP Short Term Goal 6 - Progress (Week 1): Met   New Short Term Goals: Week 2: SLP Short Term Goal 1 (Week 2): Pt will tolerate Dys2 diet and thin liquids without overt s/s aspiration given min A verbal and visual cues for use of safe swallow strategies. SLP Short Term Goal 2 (Week 2): Pt will produce 3-5 vowel sounds and/or CV syllables with 50% accuracy given maxA verbal and visual cues SLP Short Term Goal 3 (Week 2): Pt will demonstrate comprehension  of functional written information with 80% accuracy given min cues  Weekly Progress Updates: Pt has met 4/5 goals (one goal was discontinued), with improvement noted in diet tolerance/advancement, receptive language skills, and continuing progress toward effective communication via augmentative device (communication board). Goals were updated to include tolerance of advanced diet, and reading comprehension. Skilled ST intervention is recommended to maximize effective communication and tolerance of least restrictive diet.   Intensity: 1-2x/day, 30-90 minutes Frequency: 3-5/7 days Duration/Length of Stay: 05/17/2019 Treatment/Interventions: Environmental controls, speech/language facilitation, cueing hierarchy, functional tasks, therapeutic activities, oral motor exercises, dysphagia/aspiration precaution training, patient/family education.   Daily Session Skilled Therapeutic Interventions: Pt was seen for skilled ST intervention targeting assessment of reading comprehension. The Reading Comprehension Battery for Aphasia (RCBA) was initiated, completing subtests 1 through 7. Pt scores were as follows: I.  Visual Confusions  90% II.  Auditory Confusions  90% III.  Semantic Confusions  60% IV. Functional Reading  30% V.  Synonyms   60% VI. Sentence Comprehension 90% VII.  Short Paragraphs  20%  General  Pt was awake, alert, pleasant and cooperative during this session. Pt demonstrated fatigue, but was willing to continue.  Pain Pain Assessment Pain Scale: 0-10 Pain Score: 0-No pain Faces Pain Scale: No hurt  Therapy/Group: Individual Therapy  Shonna Chock 04/27/2019, 12:29 PM

## 2019-04-27 NOTE — Progress Notes (Signed)
Occupational Therapy Session Note  Patient Details  Name: Treveion Recio MRN: [id number] Date of Birth: 25-Oct-1951  Today's Date: 04/27/2019 OT Individual Time: 1305-1330 OT Individual Time Calculation (min): 25 min    Short Term Goals: Week 2:  OT Short Term Goal 1 (Week 2): Pt will complete toilet transfer with mod assist. OT Short Term Goal 2 (Week 2): Pt will complete UB dressing with mod assist OT Short Term Goal 3 (Week 2): Pt will complete LB bathing with mod assist sit to stand.   OT Short Term Goal 4 (Week 2): Pt will complete LB dressing with mod assist for pulling pants over hips.    Skilled Therapeutic Interventions/Progress Updates:    Treatment session with focus on RUE PROM and sit > stand. Pt received upright in w/c agreeable to therapy session.  Engaged in PROM to LUE with focus on elbow extension due to developing flexor tone.  Pt able to tolerate PROM to ~30* extension, noted tone to decrease when in increased extension.  Engaged in sit > stand at sink with use of mirror for visual feedback for upright standing posture.  Pt tolerated standing <10 seconds at a time before indicating pain/discomfort in RLE. Mod assist sit > stand with multimodal cues for weight shift and upright standing posture.  Pt returned to sitting upright in w/c with seat belt alarm on and all needs in reach.  Therapy Documentation Precautions:  Precautions Precautions: Fall Precaution Comments: Expressive aphasia, R hemiparesis Restrictions Weight Bearing Restrictions: No General:   Vital Signs: Therapy Vitals Pulse Rate: 79 Resp: 18 BP: 109/77 Patient Position (if appropriate): Sitting Oxygen Therapy SpO2: 100 % O2 Device: Room Air Pain: Pt pointing to Rt ankle and calf post standing, indicating pain.  RN notified.   Therapy/Group: Individual Therapy  Rosalio Loud 04/27/2019, 1:42 PM

## 2019-04-27 NOTE — Progress Notes (Signed)
Physical Therapy Session Note  Patient Details  Name: Gregory [name] MRN: [id number] Date of Birth: [birth year]  Today's Date: 04/27/2019 PT Individual Time: 0915-1018 PT Individual Time Calculation (min): 63 min   Short Term Goals: Week 2:   PT Short Term Goal 1 (Week 2): Pt will perform sit<>stand with RW and min assist PT Short Term Goal 2 (Week 2): Pt will perform bed<>chair transfer with min assist PT Short Term Goal 3 (Week 2): Pt will ambulated x 10 ft with max assist and LRAD   Skilled Therapeutic Interventions/Progress Updates:    Pt received supine in bed and agreeable to therapy session. Performed supine to sit, HOB elevated and using bedrails, with min assist for trunk upright. Performed R squat pivot transfer max assist 1-2 for lifting/scooting hips and for trunk control. Transported in w/c to/from therapy gym. Performed 3x sit<>stand from w/c to RW with assist for R UE placement and mod assist for coming to standing, mirror feedback for upright stance with cuing and manual facilitation for improved hip extension - tolerated standing ~20-30 seconds each. Pt continues to report R knee pain during session limiting tolerance to weight bearing/standing activities. Performed seated R LE knee extension/flexion stretch/PROM to assist with tone management. Pt ambulated ~25ft with max assist of 2 via 3 muskateer style and 3rd person for w/c follow - 2 instances of knee buckling requiring max/total assist of 2 to remain upright - increased R LE flexor tone noted throughout ambulation with pt unable to get foot flat on floor despite therapist assisting with R LE swing phase of gait. Performed seated balance cone reaching focusing on maintaining balance during R lateral and anterior reaching. Performed seated repetitive R lateral lean on forearm support to gradually lower surface with pt able to re-align to midline without assist and no use of L UE - manual facilitation for weightbearing through R  UE throughout. Performed squat pivot transfers w/c<>EOM with mod assist both directions and increased time for UE placement and repositioning - continues to require manual facilitation/assist for anterior trunk lean to improve hip clearance. Pt transported back to room in w/c and left sitting in w/c with needs in reach and seat belt alarm on.   Therapy Documentation Precautions:  Precautions Precautions: Fall Precaution Comments: Expressive aphasia, R hemiparesis Restrictions Weight Bearing Restrictions: No  Pain:   Continues to reports R knee pain and via questioning pt confirmed it is knee joint pain - this continues to limit pt's tolerance to weightbearing/standing activity. Performed therapy to pt tolerance with sitting rest breaks throughout.   Therapy/Group: Individual Therapy  Ginny Forth, PT, DPT 04/27/2019, 7:47 AM

## 2019-04-27 NOTE — Progress Notes (Signed)
[facility] NEPHROLOGY PROGRESS NOTE  Assessment/ Plan: Pt is a 68 y.o. yo male CVA, hypertension, CKD stage III with a bladder outlet obstruction and worsening renal failure.  #Acute kidney injury on CKD stage III likely due to obstructive uropathy, nonoliguric: Patient has Foley catheter with urine output 1350 cc.  Serum creatinine level trending down to 4.1 today.   -Continue to monitor BMP, strict ins and out and avoid nephrotoxins. -Repeat UA on 5/4 normal.  #Hyperkalemia: Potassium level improved.  Currently on Veltassa.  Monitor lab.  Recommend low potassium diet.    #Hypertension: On metoprolol.  Monitor blood pressure  #Acute stroke with right-sided weakness/expressive aphasia currently in rehab.  Mild anemia   Subjective: Seen and examined.  He is doing rehab.  No new event.  No complaint.  Objective Vital signs in last 24 hours: Vitals:   04/26/19 1329 04/26/19 2037 04/26/19 2038 04/27/19 0439  BP: 140/71 (!) 143/123 (!) 121/101 113/66  Pulse: 82 91  98  Resp: 18 16  16   Temp: 97.9 F (36.6 C) (!) 97.3 F (36.3 C)  97.9 F (36.6 C)  TempSrc: Oral Oral  Oral  SpO2:  99%  100%  Weight:      Height:       Weight change:   Intake/Output Summary (Last 24 hours) at 04/27/2019 1142 Last data filed at 04/27/2019 0849 Gross per 24 hour  Intake 582 ml  Output 1650 ml  Net -1068 ml       Labs: Basic Metabolic Panel: Recent Labs  Lab 04/25/19 0537 04/26/19 0519 04/27/19 0510  NA 136 136 135  K 5.1 4.4 4.4  CL 95* 97* 100  CO2 28 23 23   GLUCOSE 99 90 102*  BUN 92* 86* 79*  CREATININE 5.22* 4.72* 4.11*  CALCIUM 9.4 8.8* 8.9  PHOS 5.8* 5.0* 4.7*   Liver Function Tests: Recent Labs  Lab 04/25/19 0537 04/26/19 0519 04/27/19 0510  ALBUMIN 2.9* 2.6* 2.7*   No results for input(s): LIPASE, AMYLASE in the last 168 hours. No results for input(s): AMMONIA in the last 168 hours. CBC: Recent Labs  Lab 04/25/19 0537  WBC 10.3  HGB 10.2*   HCT 33.6*  MCV 92.6  PLT 290   Cardiac Enzymes: No results for input(s): CKTOTAL, CKMB, CKMBINDEX, TROPONINI in the last 168 hours. CBG: No results for input(s): GLUCAP in the last 168 hours.  Iron Studies: No results for input(s): IRON, TIBC, TRANSFERRIN, FERRITIN in the last 72 hours. Studies/Results: Dg Ankle 2 Views Right  Result Date: 04/26/2019 CLINICAL DATA:  Acute RIGHT ankle pain, no known injury EXAM: RIGHT ANKLE - 2 VIEW COMPARISON:  None FINDINGS: Osseous mineralization normal. Joint spaces preserved. No fracture, dislocation, or bone destruction. Small vessel arterial calcifications noted at the ankle and foot. IMPRESSION: No acute osseous abnormalities. Electronically Signed   By: Ulyses SouthwardMark  Boles M.D.   On: 04/26/2019 13:35    Medications: Infusions:   Scheduled Medications: . aspirin  300 mg Rectal Daily   Or  . aspirin  325 mg Oral Daily  . atorvastatin  80 mg Oral q1800  . blood pressure control book   Does not apply Once  . clopidogrel  75 mg Oral Daily  . heparin  5,000 Units Subcutaneous Q8H  . metoprolol tartrate  25 mg Oral BID  . patiromer  25.2 g Oral Daily    have reviewed scheduled and prn medications.  Physical Exam: General:, NAD, comfortable Heart:RRR, s1s2 nl, no rubs Lungs:  Bilateral, no wheezing or crackle. Abdomen:soft, Non-tender, non-distended Extremities:No edema Neurology: Expressive aphasia, nods and shakes mostly. Foley catheter with clear urine  Dron Prasad Bhandari 04/27/2019,11:42 AM  LOS: 9 days

## 2019-04-27 NOTE — Plan of Care (Signed)
  Problem: Consults Goal: RH STROKE PATIENT EDUCATION Description See Patient Education module for education specifics  Outcome: Progressing   Problem: RH BOWEL ELIMINATION Goal: RH STG MANAGE BOWEL WITH ASSISTANCE Description STG Manage Bowel with Mod Assistance.  Outcome: Progressing   Problem: RH BLADDER ELIMINATION Goal: RH STG MANAGE BLADDER WITH EQUIPMENT WITH ASSISTANCE Description STG Manage Bladder With Equipment With Max Assistance  Outcome: Progressing   Problem: RH SKIN INTEGRITY Goal: RH STG SKIN FREE OF INFECTION/BREAKDOWN Description No new breakdown with min assist   Outcome: Progressing   Problem: RH SAFETY Goal: RH STG ADHERE TO SAFETY PRECAUTIONS W/ASSISTANCE/DEVICE Description STG Adhere to Safety Precautions With Min Assistance/Device.  Outcome: Progressing   Problem: RH COGNITION-NURSING Goal: RH STG USES MEMORY AIDS/STRATEGIES W/ASSIST TO PROBLEM SOLVE Description STG Uses Memory Aids/Strategies With Min Assistance to Problem Solve.  Outcome: Progressing   Problem: RH PAIN MANAGEMENT Goal: RH STG PAIN MANAGED AT OR BELOW PT'S PAIN GOAL Description < 3 out of 10.    Outcome: Progressing   

## 2019-04-27 NOTE — Progress Notes (Signed)
[facility] PHYSICAL MEDICINE & REHABILITATION PROGRESS NOTE   Subjective/Complaints:  Remains severely aphasic.  I wrote and told pt his d/c date 5/27 Intake fair this am Ankle film neg  ROS   CP, shortness of breath, nausea, vomiting.   Objective:   Dg Ankle 2 Views Right  Result Date: 04/26/2019 CLINICAL DATA:  Acute RIGHT ankle pain, no known injury EXAM: RIGHT ANKLE - 2 VIEW COMPARISON:  None FINDINGS: Osseous mineralization normal. Joint spaces preserved. No fracture, dislocation, or bone destruction. Small vessel arterial calcifications noted at the ankle and foot. IMPRESSION: No acute osseous abnormalities. Electronically Signed   By: Ulyses SouthwardMark  Boles M.D.   On: 04/26/2019 13:35   Recent Labs    04/25/19 0537  WBC 10.3  HGB 10.2*  HCT 33.6*  PLT 290   Recent Labs    04/26/19 0519 04/27/19 0510  NA 136 135  K 4.4 4.4  CL 97* 100  CO2 23 23  GLUCOSE 90 102*  BUN 86* 79*  CREATININE 4.72* 4.11*  CALCIUM 8.8* 8.9    Intake/Output Summary (Last 24 hours) at 04/27/2019 1054 Last data filed at 04/27/2019 0849 Gross per 24 hour  Intake 582 ml  Output 1650 ml  Net -1068 ml     Physical Exam: Vital Signs Blood pressure 113/66, pulse 98, temperature 97.9 F (36.6 C), temperature source Oral, resp. rate 16, height 5\' 4"  (1.626 m), weight 81.9 kg, SpO2 100 %. Constitutional: No distress . Vital signs reviewed. HENT: Normocephalic.  Atraumatic. Eyes: EOMI.  No discharge. Cardiovascular: No JVD. Respiratory: Normal effort. GI: Non-distended. Musc: No edema or tenderness in extremities. Neurologic: Alert Motor: LLE: 5/5 proximal distal RLE: 3-/5 proximal to distal  Expressive aphasia  Assessment/Plan: 1. 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  Physiatrist is providing close team supervision and 24 hour management of  active medical problems listed below.  Physiatrist and rehab team continue to assess barriers to discharge/monitor patient progress toward functional and medical goals  Care Tool:  Bathing    Body parts bathed by patient: Right arm, Chest, Abdomen, Front perineal area, Left upper leg, Right upper leg, Face, Right lower leg, Left lower leg   Body parts bathed by helper: Buttocks, Left arm Body parts n/a: Right lower leg, Left lower leg   Bathing assist Assist Level: Maximal Assistance - Patient 24 - 49%     Upper Body Dressing/Undressing Upper body dressing   What is the patient wearing?: Pull over shirt    Upper body assist Assist Level: Maximal Assistance - Patient 25 - 49%    Lower Body Dressing/Undressing Lower body dressing      What is the patient wearing?: Incontinence brief, Pants     Lower body assist Assist for lower body dressing: Maximal Assistance - Patient 25 - 49%     Toileting Toileting    Toileting assist Assist for toileting: Moderate Assistance - Patient 50 - 74%     Transfers Chair/bed transfer  Transfers assist     Chair/bed transfer assist level: Moderate Assistance - Patient 50 - 74%     Locomotion Ambulation   Ambulation assist   Ambulation activity did not occur: Safety/medical concerns  Assist level: 2 helpers Assistive device: Other (comment)(3 musketeers) Max distance: 20 ft   Walk 10 feet activity   Assist  Walk 10 feet activity did not occur: Safety/medical concerns  Assist level: 2 helpers Assistive device: Other (comment)   Walk 50 feet activity   Assist Walk 50 feet with 2 turns activity did not occur: Safety/medical concerns         Walk 150 feet activity   Assist Walk 150 feet activity did not occur: Safety/medical concerns         Walk 10 feet on uneven surface  activity   Assist Walk 10 feet on uneven surfaces activity did not occur: Safety/medical concerns          Wheelchair     Assist Will patient use wheelchair at discharge?: Yes Type of Wheelchair: Manual    Wheelchair assist level: Dependent - Patient 0% Max wheelchair distance: 150 ft    Wheelchair 50 feet with 2 turns activity    Assist        Assist Level: Dependent - Patient 0%   Wheelchair 150 feet activity     Assist     Assist Level: Dependent - Patient 0%    Medical Problem List and Plan: 1.Right hemiparesis and aphasia/dysphagiasecondary to left insular and posterior frontal infarct embolic secondary to non-STEMI. Status post loop recorder  Continue CIR PT, OT, SLP,  2. Antithrombotics: -DVT/anticoagulation:Subcutaneous heparin. -antiplatelet therapy: Aspirin 325 mg daily, Plavix 75 mg daily 3. Pain Management:Tylenol as needed 4. Mood:Provide emotional support -antipsychotic agents: N/A 5. Neuropsych: This patientisnot fully capable of making decisions on hisown behalf. 6. Skin/Wound Care:Routine skin checks 7. Fluids/Electrolytes/Nutrition:Routine in and outs  Hyperkalemia, improved on Veltassa 8.Dysphagia. Dysphagia #1 honey thick liquids.   Advanced to D1 thins, continue to advance as tolerated   Monitor for aspiration, also needs IVF for hydration  9.Acute on chronic kidney disease stage III/hyperkalemia with obstructive uropathy/BPH. Follow-up urology services as well as nephrology services. -FOLEY TUBE TO REMAIN IN PLACE UNTIL FOLLOW UP OUT PATIENT WITH UROLOGY  Creatinine 5.14 on 5/3  Diuretics being held by nephro  Appreciate nephro assist 10.Hyperlipidemia. Lipitor 11.Hypertension. Lopressor 25 mg twice daily. Monitor with increased mobility Vitals:   04/26/19 2038 04/27/19 0439  BP: (!) 121/101 113/66  Pulse:  98  Resp:  16  Temp:  97.9 F (36.6 C)  SpO2:  100%   Controlled 5/7 12.OSA. Continue CPAP 13.  Acute blood loss anemia  Hemoglobin 9.5 on 4/29, 10.2 on  5/5  Continue to monitor 14.  Constipation alternating with loose stools incont smear per CNA today  9 days A FACE TO FACE EVALUATION WAS PERFORMED  Erick Colace 04/27/2019, 10:54 AM

## 2019-04-27 NOTE — Progress Notes (Signed)
Occupational Therapy Session Note  Patient Details  Name: Gregory [name] MRN: [id number] Date of Birth: [birth year]  Today's Date: 04/27/2019 OT Individual Time: 1430-1526 OT Individual Time Calculation (min): 56 min    Short Term Goals: Week 2:  OT Short Term Goal 1 (Week 2): Pt will complete toilet transfer with mod assist. OT Short Term Goal 2 (Week 2): Pt will complete UB dressing with mod assist OT Short Term Goal 3 (Week 2): Pt will complete LB bathing with mod assist sit to stand.   OT Short Term Goal 4 (Week 2): Pt will complete LB dressing with mod assist for pulling pants over hips.    Skilled Therapeutic Interventions/Progress Updates:    Pt completed shower and dressing sit to stand at the sink.  He was able to complete stand pivot transfer to the tub bench with max assist for showering.  Mod instructional cueing for sequencing with overall mod assist to complete all bathing in sitting with lateral lean to the left side for washing peri area.  Did not attempt standing in the shower at this time.  Integrated LH sponge with min instructional cueing for washing his feet and lower legs.  Max hand over hand assist needed to use the RUE for washing the left arm secondary to decreased motor function and increased elbow flexor tone.  Next, had pt complete transfer back to the wheelchair with total assist stand pivot with increased tone in the RLE noted.  Worked on dressing at the sink with min assist for donning pullover shirt with max instructional cueing to start with the impaired RUE and RLE.  He was able to integrate use of the reacher, for threading pants with mod assist as well as use of the sockaide with max assist for LB dressing.  Max assist needed for sit to stand to pull pants over his hips as well.  Finished session with pt completing stand pivot transfer back to the bed and being left with call button and phone in reach with safety alarm in place.    Therapy  Documentation Precautions:  Precautions Precautions: Fall Precaution Comments: Expressive aphasia, R hemiparesis Restrictions Weight Bearing Restrictions: No  Pain: Pain Assessment Pain Scale: Faces Pain Score: 0-No pain ADL: See Care Tool Section for some details of ADL  Therapy/Group: Individual Therapy  Lavella Myren OTR/L 04/27/2019, 4:40 PM

## 2019-04-27 NOTE — Progress Notes (Signed)
Social Work Patient ID: [name], male   DOB: Oct 16, 1951, 68 y.o.   MRN: [id number] Spoke with Annie-ex-wife and left message for son-Torrie to inform team conference goals min assist level and target discharge date of 5/27. Both will need to come in and learn his care prior to DC home. Pt nodded yes when informing him of the discharge date. He wants to go home. Will continue to work on discharge needs and have family come at the end of next week for education.

## 2019-04-28 ENCOUNTER — Inpatient Hospital Stay (HOSPITAL_COMMUNITY): Payer: Medicare HMO | Admitting: Occupational Therapy

## 2019-04-28 ENCOUNTER — Inpatient Hospital Stay (HOSPITAL_COMMUNITY): Payer: Medicare HMO | Admitting: Physical Therapy

## 2019-04-28 ENCOUNTER — Ambulatory Visit (HOSPITAL_COMMUNITY): Payer: Medicare HMO | Admitting: Speech Pathology

## 2019-04-28 NOTE — Progress Notes (Signed)
[facility]    NEPHROLOGY PROGRESS NOTE  SUBJECTIVE: Patient seen and examined this morning.  Has no specific complaints other than right leg weakness.  Answers yes no to questions.  Denies any chest pain, shortness of breath, nausea, vomiting, diarrhea or dysuria.  All other review of systems are negative.  OBJECTIVE:  Vitals:   04/28/19 0819 04/28/19 1547  BP: 118/60 (!) 115/48  Pulse: 88 84  Resp:  20  Temp:  (!) 97.1 F (36.2 C)  SpO2:  100%    Intake/Output Summary (Last 24 hours) at 04/28/2019 1615 Last data filed at 04/28/2019 1234 Gross per 24 hour  Intake 562 ml  Output 1000 ml  Net -438 ml      General: Alert, answers yes no to questions, in no acute distress, expressive a aphasia HEENT: MMM [place] AT anicteric sclera Neck:  No JVD, no adenopathy CV:  Heart RRR  Lungs:  L/S CTA bilaterally Abd:  abd SNT/ND with normal BS GU:  Bladder non-palpable, Foley with clear urine Extremities:  No LE edema. Skin:  No skin rash  MEDICATIONS:  . aspirin  300 mg Rectal Daily   Or  . aspirin  325 mg Oral Daily  . atorvastatin  80 mg Oral q1800  . blood pressure control book   Does not apply Once  . clopidogrel  75 mg Oral Daily  . heparin  5,000 Units Subcutaneous Q8H  . metoprolol tartrate  25 mg Oral BID  . patiromer  25.2 g Oral Daily       LABS:   CBC Latest Ref Rng & Units 04/25/2019 04/19/2019 04/18/2019  WBC 4.0 - 10.5 K/uL 10.3 10.1 9.1  Hemoglobin 13.0 - 17.0 g/dL 10.2(L) 9.5(L) 9.1(L)  Hematocrit 39.0 - 52.0 % 33.6(L) 31.6(L) 30.5(L)  Platelets 150 - 400 K/uL 290 233 230    CMP Latest Ref Rng & Units 04/27/2019 04/26/2019 04/25/2019  Glucose 70 - 99 mg/dL 240(X) 90 99  BUN 8 - 23 mg/dL 73(Z) 32(D) 92(E)  Creatinine 0.61 - 1.24 mg/dL 2.68(T) 4.19(Q) 2.22(L)  Sodium 135 - 145 mmol/L 135 136 136  Potassium 3.5 - 5.1 mmol/L 4.4 4.4 5.1  Chloride 98 - 111 mmol/L 100 97(L) 95(L)  CO2 22 - 32 mmol/L 23 23 28   Calcium 8.9 - 10.3 mg/dL 8.9 7.9(G) 9.4   Total Protein 6.5 - 8.1 g/dL - - -  Total Bilirubin 0.3 - 1.2 mg/dL - - -  Alkaline Phos 38 - 126 U/L - - -  AST 15 - 41 U/L - - -  ALT 0 - 44 U/L - - -    Lab Results  Component Value Date   CALCIUM 8.9 04/27/2019   CAION 1.20 09/09/2018   PHOS 4.7 (H) 04/27/2019       Component Value Date/Time   COLORURINE YELLOW 04/24/2019 1730   APPEARANCEUR CLEAR 04/24/2019 1730   LABSPEC 1.013 04/24/2019 1730   PHURINE 6.0 04/24/2019 1730   GLUCOSEU NEGATIVE 04/24/2019 1730   HGBUR SMALL (A) 04/24/2019 1730   BILIRUBINUR NEGATIVE 04/24/2019 1730   KETONESUR NEGATIVE 04/24/2019 1730   PROTEINUR NEGATIVE 04/24/2019 1730   UROBILINOGEN 0.2 04/23/2010 1049   NITRITE NEGATIVE 04/24/2019 1730   LEUKOCYTESUR NEGATIVE 04/24/2019 1730      Component Value Date/Time   TCO2 31 09/09/2018 0823       Component Value Date/Time   IRON 121 04/05/2019 1313   TIBC 199 (L) 04/05/2019 1313   FERRITIN 509 (H) 04/05/2019 1313  IRONPCTSAT 61 (H) 04/05/2019 1313       ASSESSMENT/PLAN:    68 year old male patient with a history of hypertension and chronic kidney disease stage III with a baseline serum creatinine around 1.7 in September 2019 who presents with an acute CVA.  He was found to have bladder outlet obstruction and acute kidney injury.  1.  Chronic kidney disease stage III.  His baseline serum creatinine is around 1.7 from September 2009.  His urinalysis is negative for protein.  Renal ultrasound from 04/05/2019 revealed bilateral hydronephrosis.  2.  Acute kidney injury.  Likely secondary to bladder outlet obstruction.  Renal function is slowly improving.  Maintain Foley per urology.  3.  Acute CVA.  Continue PT/OT  4.  Hyperkalemia.  Likely secondary to obstruction.  Improved.   [address]Sheryl Saintil DilworthFinnigan, DO, [place]ACP

## 2019-04-28 NOTE — Progress Notes (Signed)
Speech Language Pathology Daily Session Note  Patient Details  Name: Gregory [name] MRN: [id number] Date of Birth: [birth year]  Today's Date: 04/28/2019 SLP Individual Time: 1100-1200 SLP Individual Time Calculation (min): 60 min  Short Term Goals: Week 2: SLP Short Term Goal 1 (Week 2): Pt will tolerate Dys2 diet and thin liquids without overt s/s aspiration given min A verbal and visual cues for use of safe swallow strategies. SLP Short Term Goal 2 (Week 2): Pt will produce 3-5 vowel sounds and or C-V combinations with 50% accuracy given max verbal/visual cues. SLP Short Term Goal 3 (Week 2): Pt will demonstrate comprehension of functional written information with 80% accuracy given min cues  Skilled Therapeutic Interventions:  Skilled treatment session focused on dysphagia, communication and education regarding current diet recommendation. SLP facilitated session by providing skilled observation of pt consuming dysphagia 2 lunch tray with thin liquids. Pt with moderate right anterior spillage but good overall oral clearing. No overt s/s of aspiration observed when consuming thin liquids via cup. Recommend diet upgrade to dysphagia 2 with thin liquids via cup, full nursing supervision to ensure complete oral clearing with initial upgrade.  SLP further facilitated session by providing functional written information with ~ 50% accuracy achieved with Min A cues. Pt left upright in wheelchair, lap belt alarm on and all needs within reach. Continue per current plan of care.      Pain    Therapy/Group: Individual Therapy  Jeree Delcid 04/28/2019, 5:03 PM

## 2019-04-28 NOTE — Progress Notes (Signed)
Occupational Therapy Session Note  Patient Details  Name: Gregory [name] MRN: [id number] Date of Birth: 03/23/1951  Today's Date: 04/28/2019 OT Individual Time: 0086-7619 OT Individual Time Calculation (min): 58 min    Short Term Goals: Week 2:  OT Short Term Goal 1 (Week 2): Pt will complete toilet transfer with mod assist. OT Short Term Goal 2 (Week 2): Pt will complete UB dressing with mod assist OT Short Term Goal 3 (Week 2): Pt will complete LB bathing with mod assist sit to stand.   OT Short Term Goal 4 (Week 2): Pt will complete LB dressing with mod assist for pulling pants over hips.    Skilled Therapeutic Interventions/Progress Updates:    Pt completed supine to sit EOB on the left side with mod assist.  Once on the EOB, had him work on donning pants.  Max assist needed with or without the reacher to donn them over his feet.  He is unable to coordinate use of the reacher with threading his LEs secondary to apraxia.  He needed max assist for standing to pull them over his hips as well with increased flexor tone noted in the right hamstrings.  He completed squat pivot transfer to the wheelchair with mod assist to the right.  Next had him work on donning socks with use of the sockaide.  Max assist for setup with mod facilitation to slide sock off of the sockaide onto his foot with use of the LUE.  He needed max assist for donning shoes and tying them.  Pt making gestures that he does not like wearing shoes as they currently seem to bother his feet.  He did wear them briefly for the rest of the session with transport down to the therapy gym via wheelchair by therapist.  Max assist for stand pivot transfers from wheelchair to mat and back during session.  While sitting on therapy mat, therapist provided NMES to the right triceps to help with increasing elbow extension against current flexor tone.  He was able to tolerate 10 mins of stimulation on custom setting.  Intensity from 50-54 with PPS at  35 and pulse width at 300, on time 10 seconds, off time 5 seconds.  No adverse reactions to stimuli noted.  Finished session with return to the room and therapist assist with removal of shoes and donning of gripper socks. Pt left in wheelchair with call button and phone in reach and safety belt in place.    Therapy Documentation Precautions:  Precautions Precautions: Fall Precaution Comments: Expressive aphasia, R hemiparesis Restrictions Weight Bearing Restrictions: No  Pain: Pain Assessment Pain Scale: Faces Faces Pain Scale: Hurts a little bit Pain Type: Acute pain Pain Location: Foot Pain Orientation: Left Pain Descriptors / Indicators: Discomfort Pain Onset: On-going(with shoe on) Pain Intervention(s): Repositioned(removed shoe) ADL: See Care Tool Section for some details of ADL  Therapy/Group: Individual Therapy  Cady Hafen OTR/L 04/28/2019, 11:44 AM

## 2019-04-28 NOTE — Progress Notes (Signed)
Physical Therapy Session Note  Patient Details  Name: Gregory [name] MRN: [id number] Date of Birth: August 28, 1951  Today's Date: 04/28/2019 PT Individual Time: 1340-1454 PT Individual Time Calculation (min): 74 min   Short Term Goals: Week 2:  PT Short Term Goal 1 (Week 2): Pt will perform sit<>stand with RW and min assist PT Short Term Goal 2 (Week 2): Pt will perform bed<>chair transfer with min assist PT Short Term Goal 3 (Week 2): Pt will ambulated x 10 ft with max assist and LRAD  Skilled Therapeutic Interventions/Progress Updates:    Pt received sitting in w/c and reports not feeling well but agreeable to participate with therapy session. Pt reports he has R LE heel pain with tenderness noted to touch but no redness noted. Pt transported to/from therapy gym in w/c. Performed R squat/stand pivot transfer w/c to EOM with mod assist for pivoting/balance. Performed sit to supine with max assist for trunk descent and LE management. Performed supine x10 hooklying lower trunk rotations for decreased R LE tone. Therapist performed supine hamstring and adductor stretch 2 x1 minute each as well as R LE hip/knee flexion/extension x20 PROM for tone management. Pt performed supine R LE heel slides 2 sets of 10 with cuing for full ROM and supine bridging 2 sets of 10 with manual facilitation and cuing for increased R LE gluteal activation. Performed supine to sit with cuing for sequencing and min assist for LE management and trunk upright with pt demonstrating ability to utilize R UE to assist with trunk control. Performed R UE elbow flexion/extension PROM with sustained hold on extension for 2x 30seconds focusing on tone management and improved R UE positioning on RW. Performed sit<>stand from EOM to RW 2 sets of 3, assist for R UE placement on orthosis, with mod progressed to min assist for lifting and mod progressed to min assist for standing balance - cuing for improved R LE hip/knee/trunk extension and  upright posture -pt able to tolerate standing for ~20 seconds each bout. Pt progressed to performing 3 bouts of standing B UE support on RW repeated L LE lift up/down focusing on R LE stance phase and NMR for quadriceps and gluteal activation/sustained contraction with min assist throughout for balance - mirror feedback provided for increased upright trunk control and cuing for trunk extension in midline due to R lateral and anterior trunk lean. Performed sitting balance tasks on EOM: R lateral trunk lean on R UE forearm supported on pillow with reaching L UE cross body and returning to midline without UE assist all with close supervision. Performed cross body reaching task with L UE focusing on R lateral and anterior trunk lean with close supervision and 1 instance of min assist to prevent R lateral LOB. Performed R UE NMR focusing on activate elbow extension with manual facilitation/assist for further extension ROM. Performed L squat/stand pivot transfer EOM to w/c with mod assist for balance and pivoting hips. Transported to room in w/c and performed L squat/stand pivot transfer w/c to EOB with mod assist for balance and pivoting hips. Performed sit to supine with max assist for trunk descent and B LE management. Pt left supine in bed with needs in reach, lines intact, and bed alarm on.   Therapy Documentation Precautions:  Precautions Precautions: Fall Precaution Comments: Expressive aphasia, R hemiparesis Restrictions Weight Bearing Restrictions: No  Pain:   Reports R LE heel pain with noted tenderness to touch but no redness noted - pt unable to provide further detail 2/2  aphasia. RN notified.   Therapy/Group: Individual Therapy  Ginny Forth, PT, DPT 04/28/2019, 1:58 PM

## 2019-04-28 NOTE — Progress Notes (Signed)
PHYSICAL MEDICINE & REHABILITATION PROGRESS NOTE   Subjective/Complaints:  Severe aphasia, seen in therapy participating well  ROS   CP, shortness of breath, nausea, vomiting.   Objective:   Dg Ankle 2 Views Right  Result Date: 04/26/2019 CLINICAL DATA:  Acute RIGHT ankle pain, no known injury EXAM: RIGHT ANKLE - 2 VIEW COMPARISON:  None FINDINGS: Osseous mineralization normal. Joint spaces preserved. No fracture, dislocation, or bone destruction. Small vessel arterial calcifications noted at the ankle and foot. IMPRESSION: No acute osseous abnormalities. Electronically Signed   By: Ulyses SouthwardMark  Boles M.D.   On: 04/26/2019 13:35   No results for input(s): WBC, HGB, HCT, PLT in the last 72 hours. Recent Labs    04/26/19 0519 04/27/19 0510  NA 136 135  K 4.4 4.4  CL 97* 100  CO2 23 23  GLUCOSE 90 102*  BUN 86* 79*  CREATININE 4.72* 4.11*  CALCIUM 8.8* 8.9    Intake/Output Summary (Last 24 hours) at 04/28/2019 1002 Last data filed at 04/28/2019 0757 Gross per 24 hour  Intake 562 ml  Output 1150 ml  Net -588 ml     Physical Exam: Vital Signs Blood pressure 118/60, pulse 88, temperature 98.3 F (36.8 C), temperature source Oral, resp. rate 12, height 5\' 4"  (1.626 m), weight 81.9 kg, SpO2 99 %. Constitutional: No distress . Vital signs reviewed. HENT: Normocephalic.  Atraumatic. Eyes: EOMI.  No discharge. Cardiovascular: No JVD. Respiratory: Normal effort. GI: Non-distended. Musc: No edema or tenderness in extremities. Neurologic: Alert Motor: LLE: 5/5 proximal distal RLE: 3-/5 proximal to distal  Expressive aphasia  Assessment/Plan: 1. 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  Physiatrist is providing close team supervision and 24 hour management of active medical problems listed below.  Physiatrist and rehab team  continue to assess barriers to discharge/monitor patient progress toward functional and medical goals  Care Tool:  Bathing    Body parts bathed by patient: Right arm, Chest, Abdomen, Front perineal area, Left upper leg, Right upper leg, Face, Right lower leg, Left lower leg   Body parts bathed by helper: Buttocks, Left arm Body parts n/a: Right lower leg, Left lower leg   Bathing assist Assist Level: Moderate Assistance - Patient 50 - 74%(sitting with lateral lean to the left, no standing)     Upper Body Dressing/Undressing Upper body dressing   What is the patient wearing?: Pull over shirt    Upper body assist Assist Level: Minimal Assistance - Patient > 75%    Lower Body Dressing/Undressing Lower body dressing      What is the patient wearing?: Incontinence brief, Pants     Lower body assist Assist for lower body dressing: Maximal Assistance - Patient 25 - 49%     Toileting Toileting    Toileting assist Assist for toileting: Moderate Assistance - Patient 50 - 74%     Transfers Chair/bed transfer  Transfers assist     Chair/bed transfer assist level: Moderate Assistance - Patient 50 - 74%(slide board)     Locomotion Ambulation   Ambulation assist   Ambulation activity did not occur: Safety/medical concerns  Assist level: 2 helpers Assistive device: Other (comment)(3 Investment banker, corporatemuskateer) Max distance: 8811ft   Walk 10 feet activity   Assist  Walk 10 feet activity did not occur: Safety/medical concerns  Assist level: 2 helpers Assistive device: Other (comment)(3 muskateer)   Walk 50  feet activity   Assist Walk 50 feet with 2 turns activity did not occur: Safety/medical concerns         Walk 150 feet activity   Assist Walk 150 feet activity did not occur: Safety/medical concerns         Walk 10 feet on uneven surface  activity   Assist Walk 10 feet on uneven surfaces activity did not occur: Safety/medical concerns          Wheelchair     Assist Will patient use wheelchair at discharge?: Yes Type of Wheelchair: Manual    Wheelchair assist level: Dependent - Patient 0% Max wheelchair distance: 150 ft    Wheelchair 50 feet with 2 turns activity    Assist        Assist Level: Dependent - Patient 0%   Wheelchair 150 feet activity     Assist     Assist Level: Dependent - Patient 0%    Medical Problem List and Plan: 1.Right hemiparesis and aphasia/dysphagiasecondary to left insular and posterior frontal infarct embolic secondary to non-STEMI. Status post loop recorder  Continue CIR PT, OT, SLP,  2. Antithrombotics: -DVT/anticoagulation:Subcutaneous heparin. -antiplatelet therapy: Aspirin 325 mg daily, Plavix 75 mg daily 3. Pain Management:Tylenol as needed 4. Mood:Provide emotional support -antipsychotic agents: N/A 5. Neuropsych: This patientisnot fully capable of making decisions on hisown behalf. 6. Skin/Wound Care:Routine skin checks 7. Fluids/Electrolytes/Nutrition:Routine in and outs  Hyperkalemia, improved on Veltassa 8.Dysphagia. Dysphagia #1 honey thick liquids.   Advanced to D1 thins, continue to advance as tolerated   Monitor for aspiration, also needs IVF for hydration  9.Acute on chronic kidney disease stage III/hyperkalemia with obstructive uropathy/BPH. Follow-up urology services as well as nephrology services. -FOLEY TUBE TO REMAIN IN PLACE UNTIL FOLLOW UP OUT PATIENT WITH UROLOGY  Creatinine 5.14 on 5/3 to 4.11 on 5/7 Diuretics being held by nephro  Appreciate nephro assist 10.Hyperlipidemia. Lipitor 11.Hypertension. Lopressor 25 mg twice daily. Monitor with increased mobility Vitals:   04/28/19 0617 04/28/19 0819  BP: (!) 107/52 118/60  Pulse: 86 88  Resp: 12   Temp: 98.3 F (36.8 C)   SpO2: 99%    Controlled 5/7 12.OSA. Continue CPAP 13.  Acute blood loss anemia  Hemoglobin 9.5 on 4/29,  10.2 on 5/5  Continue to monitor 14.  Constipation alternating with loose stools, improving   10 days A FACE TO FACE EVALUATION WAS PERFORMED  Erick Colace 04/28/2019, 10:02 AM

## 2019-04-29 ENCOUNTER — Inpatient Hospital Stay (HOSPITAL_COMMUNITY): Payer: Medicare HMO | Admitting: Physical Therapy

## 2019-04-29 NOTE — Progress Notes (Signed)
[facility] PHYSICAL MEDICINE & REHABILITATION PROGRESS NOTE   Subjective/Complaints:  No new issues. Appears to have slept ok. And looks comfortable  ROS: limited due to language/communication   Objective:   No results found. No results for input(s): WBC, HGB, HCT, PLT in the last 72 hours. Recent Labs    04/27/19 0510  NA 135  K 4.4  CL 100  CO2 23  GLUCOSE 102*  BUN 79*  CREATININE 4.11*  CALCIUM 8.9    Intake/Output Summary (Last 24 hours) at 04/29/2019 1145 Last data filed at 04/29/2019 0700 Gross per 24 hour  Intake 684 ml  Output 850 ml  Net -166 ml     Physical Exam: Vital Signs Blood pressure 136/90, pulse 83, temperature 98.3 F (36.8 C), temperature source Oral, resp. rate 20, height 5\' 4"  (1.626 m), weight 83.3 kg, SpO2 100 %. Constitutional: No distress . Vital signs reviewed. HEENT: EOMI, oral membranes moist Neck: supple Cardiovascular: RRR without murmur. No JVD    Respiratory: CTA Bilaterally without wheezes or rales. Normal effort    GI: BS +, non-tender, non-distended . Musc: No edema or tenderness in extremities. Neurologic: Alert Motor: LLE: 5/5 proximal distal RLE: 3-/5 proximal to distal  Expressive aphasia, nods y/n  Assessment/Plan: 1. 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  Physiatrist is providing close team supervision and 24 hour management of active medical problems listed below.  Physiatrist and rehab team continue to assess barriers to discharge/monitor patient progress toward functional and medical goals  Care Tool:  Bathing    Body parts bathed by patient: Right arm, Chest, Abdomen, Front perineal area, Left upper leg, Right upper leg, Face, Right lower leg, Left lower leg   Body parts bathed by helper: Buttocks, Left arm Body parts n/a: Right lower leg, Left lower leg    Bathing assist Assist Level: Moderate Assistance - Patient 50 - 74%(sitting with lateral lean to the left, no standing)     Upper Body Dressing/Undressing Upper body dressing   What is the patient wearing?: Pull over shirt    Upper body assist Assist Level: Minimal Assistance - Patient > 75%    Lower Body Dressing/Undressing Lower body dressing      What is the patient wearing?: Pants     Lower body assist Assist for lower body dressing: Maximal Assistance - Patient 25 - 49%     Toileting Toileting    Toileting assist Assist for toileting: Moderate Assistance - Patient 50 - 74%     Transfers Chair/bed transfer  Transfers assist     Chair/bed transfer assist level: Moderate Assistance - Patient 50 - 74%(squat pivot)     Locomotion Ambulation   Ambulation assist   Ambulation activity did not occur: Safety/medical concerns  Assist level: 2 helpers Assistive device: Other (comment)(3 Investment banker, corporatemuskateer) Max distance: 7811ft   Walk 10 feet activity   Assist  Walk 10 feet activity did not occur: Safety/medical concerns  Assist level: 2 helpers Assistive device: Other (comment)(3 muskateer)   Walk 50 feet activity   Assist Walk 50 feet with 2 turns activity did not occur: Safety/medical concerns         Walk 150 feet activity   Assist Walk 150 feet activity did not occur: Safety/medical concerns         Walk 10 feet on uneven surface  activity   Assist Walk 10  feet on uneven surfaces activity did not occur: Safety/medical concerns         Wheelchair     Assist Will patient use wheelchair at discharge?: Yes Type of Wheelchair: Manual    Wheelchair assist level: Dependent - Patient 0% Max wheelchair distance: 150 ft    Wheelchair 50 feet with 2 turns activity    Assist        Assist Level: Dependent - Patient 0%   Wheelchair 150 feet activity     Assist     Assist Level: Dependent - Patient 0%    Medical Problem List  and Plan: 1.Right hemiparesis and aphasia/dysphagiasecondary to left insular and posterior frontal infarct embolic secondary to non-STEMI. Status post loop recorder  Continue CIR PT, OT, SLP   2. Antithrombotics: -DVT/anticoagulation:Subcutaneous heparin. -antiplatelet therapy: Aspirin 325 mg daily, Plavix 75 mg daily 3. Pain Management:Tylenol as needed 4. Mood:Provide emotional support -antipsychotic agents: N/A 5. Neuropsych: This patientisnot fully capable of making decisions on hisown behalf. 6. Skin/Wound Care:Routine skin checks 7. Fluids/Electrolytes/Nutrition:Routine in and outs  Hyperkalemia, improved on Veltassa 8.Dysphagia. Dysphagia #1 honey thick liquids.   Advanced to D2 thins, continue to advance as tolerated   Monitor for aspiration    Off IVF currently 9.Acute on chronic kidney disease stage III/hyperkalemia with obstructive uropathy/BPH. Follow-up urology services as well as nephrology services. -FOLEY TUBE TO REMAIN IN PLACE UNTIL FOLLOW UP OUT PATIENT WITH UROLOGY  Creatinine 5.14 on 5/3 to 4.11 on 5/7 Diuretics being held by nephro  Appreciate nephro assist 10.Hyperlipidemia. Lipitor 11.Hypertension. Lopressor 25 mg twice daily. Monitor with increased mobility Vitals:   04/29/19 0504 04/29/19 0921  BP: 135/69 136/90  Pulse: (!) 103 83  Resp: 20   Temp: 98.3 F (36.8 C)   SpO2: 100%    Borderline 5/9 12.OSA. Continue CPAP 13.  Acute blood loss anemia  Hemoglobin 9.5 on 4/29, 10.2 on 5/5  Continue to monitor 14.  Constipation alternating with loose stools, improving     11 days A FACE TO FACE EVALUATION WAS PERFORMED  Ranelle Oyster 04/29/2019, 11:45 AM

## 2019-04-29 NOTE — Progress Notes (Signed)
Physical Therapy Session Note  Patient Details  Name: Gregory [name] MRN: [id number] Date of Birth: [birth year]  Today's Date: 04/29/2019 PT Individual Time: 0908-1008 PT Individual Time Calculation (min): 60 min   Short Term Goals: Week 2:  PT Short Term Goal 1 (Week 2): Pt will perform sit<>stand with RW and min assist PT Short Term Goal 2 (Week 2): Pt will perform bed<>chair transfer with min assist PT Short Term Goal 3 (Week 2): Pt will ambulated x 10 ft with max assist and LRAD  Skilled Therapeutic Interventions/Progress Updates:    Pt received supine in bed and with questioning and pt using gestures and yes/no head nodes pt was able to communicate that he didn't sleep well last night because his stomach hurt and reports has has used the bathroom and has been having diarrhea - RN present and notified. Despite this pt agreeable to therapy session. Pt performed supine to sit, HOB partially elevated and using bedrails, with CGA for trunk steadying upon coming to sitting. Performed LB dressing max assist EOB and sit<>stand using RW with mod assist for lifting into standing - pt reporting dizziness. Assessed BP 136/90 (MAP 98) HR 83bpm. Performed sit<>stand from EOB to RW with mod assist for lifting to attempt standing BP assessment for orthostatics but pt unable to tolerate standing long enough for a reading. Performed R squat pivot transfer EOB to w/c with mod assist for lifting/pivoting hips and for trunk control. Pt requesting to perform oral hygiene. Performed seated toothbrushing with suction hook-up, no coughing noted. Pt transported to/from gym in w/c. Performed R squat pivot transfer w/c to EOM with mod assist lifting/pivoting hips and trunk control. Sit to supine max assist for trunk and B LE management. Therapist performed R LE hip/knee flexion/extension PROM and sustained hamstring and hip adductor stretch 2x54min each for tone and pain management. Pt performed R LE heel slides demonstrating  increased difficulty with hip flexion this session with intermittent assist for movement. Performed R UE elbow flexion/extnesion PROM and sustained extension hold 2x30 seconds - pt noted to lack full elbow extension. Supine>sit with mod assist for trunk upright. Pt reporting needing to use bathroom. Squat pivot transfer EOM>w/c mod/max assist for lifting/pivoting hips and trunk control.  Transported back to room in w/c but pt then reports the feeling passed. Performed squat pivot transfer w/c to recliner with mod/max assist lifting/pivoting/balance. Pt educated on importance of sitting up and with encouragmeent agreeable to sit in recliner till lunch - RN notified. Pt left sitting in recliner with needs in reach, seat belt alarm on, and B LEs elevated.   Therapy Documentation Precautions:  Precautions Precautions: Fall Precaution Comments: Expressive aphasia, R hemiparesis Restrictions Weight Bearing Restrictions: No  Pain:   Reports stomach pain that has been present over night associated with diarrhea - performed therapy to pt tolerance and RN present and aware.   Therapy/Group: Individual Therapy  Ginny Forth, PT, DPT 04/29/2019, 7:49 AM

## 2019-04-29 NOTE — Progress Notes (Signed)
[facility]    NEPHROLOGY PROGRESS NOTE  SUBJECTIVE: Patient working with therapy this morning.  Has no specific complaints other than right leg weakness.  Answers yes no to questions.  Denies any chest pain, shortness of breath, nausea, vomiting, diarrhea or dysuria.  All other review of systems are negative.  OBJECTIVE:  Vitals:   04/29/19 0504 04/29/19 0921  BP: 135/69 136/90  Pulse: (!) 103 83  Resp: 20   Temp: 98.3 F (36.8 C)   SpO2: 100%     Intake/Output Summary (Last 24 hours) at 04/29/2019 1123 Last data filed at 04/29/2019 0700 Gross per 24 hour  Intake 684 ml  Output 850 ml  Net -166 ml      General: Alert, answers yes no to questions, in no acute distress, expressive a aphasia HEENT: MMM [place] AT anicteric sclera Neck:  No JVD, no adenopathy CV:  Heart RRR  Lungs:  L/S CTA bilaterally Abd:  abd SNT/ND with normal BS GU:  Bladder non-palpable, Foley with clear urine Extremities:  No LE edema. Skin:  No skin rash  MEDICATIONS:  . aspirin  300 mg Rectal Daily   Or  . aspirin  325 mg Oral Daily  . atorvastatin  80 mg Oral q1800  . blood pressure control book   Does not apply Once  . clopidogrel  75 mg Oral Daily  . heparin  5,000 Units Subcutaneous Q8H  . metoprolol tartrate  25 mg Oral BID  . patiromer  25.2 g Oral Daily       LABS:   CBC Latest Ref Rng & Units 04/25/2019 04/19/2019 04/18/2019  WBC 4.0 - 10.5 K/uL 10.3 10.1 9.1  Hemoglobin 13.0 - 17.0 g/dL 10.2(L) 9.5(L) 9.1(L)  Hematocrit 39.0 - 52.0 % 33.6(L) 31.6(L) 30.5(L)  Platelets 150 - 400 K/uL 290 233 230    CMP Latest Ref Rng & Units 04/27/2019 04/26/2019 04/25/2019  Glucose 70 - 99 mg/dL 161(W102(H) 90 99  BUN 8 - 23 mg/dL 96(E79(H) 45(W86(H) 09(W92(H)  Creatinine 0.61 - 1.24 mg/dL 1.19(J4.11(H) 4.78(G4.72(H) 9.56(O5.22(H)  Sodium 135 - 145 mmol/L 135 136 136  Potassium 3.5 - 5.1 mmol/L 4.4 4.4 5.1  Chloride 98 - 111 mmol/L 100 97(L) 95(L)  CO2 22 - 32 mmol/L 23 23 28   Calcium 8.9 - 10.3 mg/dL 8.9 1.3(Y8.8(L) 9.4   Total Protein 6.5 - 8.1 g/dL - - -  Total Bilirubin 0.3 - 1.2 mg/dL - - -  Alkaline Phos 38 - 126 U/L - - -  AST 15 - 41 U/L - - -  ALT 0 - 44 U/L - - -    Lab Results  Component Value Date   CALCIUM 8.9 04/27/2019   CAION 1.20 09/09/2018   PHOS 4.7 (H) 04/27/2019       Component Value Date/Time   COLORURINE YELLOW 04/24/2019 1730   APPEARANCEUR CLEAR 04/24/2019 1730   LABSPEC 1.013 04/24/2019 1730   PHURINE 6.0 04/24/2019 1730   GLUCOSEU NEGATIVE 04/24/2019 1730   HGBUR SMALL (A) 04/24/2019 1730   BILIRUBINUR NEGATIVE 04/24/2019 1730   KETONESUR NEGATIVE 04/24/2019 1730   PROTEINUR NEGATIVE 04/24/2019 1730   UROBILINOGEN 0.2 04/23/2010 1049   NITRITE NEGATIVE 04/24/2019 1730   LEUKOCYTESUR NEGATIVE 04/24/2019 1730      Component Value Date/Time   TCO2 31 09/09/2018 0823       Component Value Date/Time   IRON 121 04/05/2019 1313   TIBC 199 (L) 04/05/2019 1313   FERRITIN 509 (H) 04/05/2019 1313  IRONPCTSAT 61 (H) 04/05/2019 1313       ASSESSMENT/PLAN:    68 year old male patient with a history of hypertension and chronic kidney disease stage III with a baseline serum creatinine around 1.7 in September 2019 who presents with an acute CVA.  He was found to have bladder outlet obstruction and acute kidney injury.  1.  Chronic kidney disease stage III.  His baseline serum creatinine is around 1.7 from September 2009.  His urinalysis is negative for protein.  Renal ultrasound from 04/05/2019 revealed bilateral hydronephrosis.  2.  Acute kidney injury.  Likely secondary to bladder outlet obstruction.  Renal function is slowly improving.  Maintain Foley per urology.  3.  Acute CVA.  Continue PT/OT  4.  Hyperkalemia.  Likely secondary to obstruction.  Improved.  Okay for disposition from renal standpoint.  Needs follow-up with urology and nephrology as an outpatient.  [address] [place], DO, [place]

## 2019-04-30 NOTE — Progress Notes (Signed)
[facility] PHYSICAL MEDICINE & REHABILITATION PROGRESS NOTE   Subjective/Complaints:  No problems over night. Appears comfortable  ROS: limited due to language/communication    Objective:   No results found. No results for input(s): WBC, HGB, HCT, PLT in the last 72 hours. No results for input(s): NA, K, CL, CO2, GLUCOSE, BUN, CREATININE, CALCIUM in the last 72 hours.  Intake/Output Summary (Last 24 hours) at 04/30/2019 0853 Last data filed at 04/30/2019 0847 Gross per 24 hour  Intake 240 ml  Output 975 ml  Net -735 ml     Physical Exam: Vital Signs Blood pressure (!) 118/45, pulse 74, temperature 98.6 F (37 C), temperature source Oral, resp. rate 20, height 5\' 4"  (1.626 m), weight 83 kg, SpO2 97 %. Constitutional: No distress . Vital signs reviewed. HEENT: EOMI, oral membranes moist Neck: supple Cardiovascular: RRR without murmur. No JVD    Respiratory: CTA Bilaterally without wheezes or rales. Normal effort    GI: BS +, non-tender, non-distended . Musc: No edema or tenderness in extremities. Neurologic: Alert Motor: LLE: 5/5 proximal distal RLE: 3-/5 proximal to distal  Expressive aphasia, nods y/n  Assessment/Plan: 1. 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  Physiatrist is providing close team supervision and 24 hour management of active medical problems listed below.  Physiatrist and rehab team continue to assess barriers to discharge/monitor patient progress toward functional and medical goals  Care Tool:  Bathing    Body parts bathed by patient: Right arm, Chest, Abdomen, Front perineal area, Left upper leg, Right upper leg, Face, Right lower leg, Left lower leg   Body parts bathed by helper: Buttocks, Left arm Body parts n/a: Right lower leg, Left lower leg   Bathing assist Assist Level: Moderate Assistance -  Patient 50 - 74%(sitting with lateral lean to the left, no standing)     Upper Body Dressing/Undressing Upper body dressing   What is the patient wearing?: Pull over shirt    Upper body assist Assist Level: Minimal Assistance - Patient > 75%    Lower Body Dressing/Undressing Lower body dressing      What is the patient wearing?: Pants     Lower body assist Assist for lower body dressing: Maximal Assistance - Patient 25 - 49%     Toileting Toileting    Toileting assist Assist for toileting: Moderate Assistance - Patient 50 - 74%     Transfers Chair/bed transfer  Transfers assist     Chair/bed transfer assist level: Moderate Assistance - Patient 50 - 74%     Locomotion Ambulation   Ambulation assist   Ambulation activity did not occur: Safety/medical concerns  Assist level: 2 helpers Assistive device: Other (comment)(3 Investment banker, corporatemuskateer) Max distance: 5311ft   Walk 10 feet activity   Assist  Walk 10 feet activity did not occur: Safety/medical concerns  Assist level: 2 helpers Assistive device: Other (comment)(3 muskateer)   Walk 50 feet activity   Assist Walk 50 feet with 2 turns activity did not occur: Safety/medical concerns         Walk 150 feet activity   Assist Walk 150 feet activity did not occur: Safety/medical concerns         Walk 10 feet on uneven surface  activity   Assist Walk 10 feet on uneven surfaces activity did not occur: Safety/medical concerns         Wheelchair  Assist Will patient use wheelchair at discharge?: Yes Type of Wheelchair: Manual    Wheelchair assist level: Dependent - Patient 0% Max wheelchair distance: 150 ft    Wheelchair 50 feet with 2 turns activity    Assist        Assist Level: Dependent - Patient 0%   Wheelchair 150 feet activity     Assist     Assist Level: Dependent - Patient 0%    Medical Problem List and Plan: 1.Right hemiparesis and aphasia/dysphagiasecondary  to left insular and posterior frontal infarct embolic secondary to non-STEMI. Status post loop recorder  Continue CIR PT, OT, SLP   2. Antithrombotics: -DVT/anticoagulation:Subcutaneous heparin. -antiplatelet therapy: Aspirin 325 mg daily, Plavix 75 mg daily 3. Pain Management:Tylenol as needed 4. Mood:Provide emotional support -antipsychotic agents: N/A 5. Neuropsych: This patientisnot fully capable of making decisions on hisown behalf. 6. Skin/Wound Care:Routine skin checks 7. Fluids/Electrolytes/Nutrition:Routine in and outs  Hyperkalemia, improved on Veltassa 8.Dysphagia. Dysphagia #1 honey thick liquids.   Advanced to D2 thins, continue to advance as tolerated   Monitor for aspiration    Off IVF currently 9.Acute on chronic kidney disease stage III/hyperkalemia with obstructive uropathy/BPH. Follow-up urology services as well as nephrology services. -FOLEY TUBE TO REMAIN IN PLACE UNTIL FOLLOW UP OUT PATIENT WITH UROLOGY  Creatinine 5.14 on 5/3 to 4.11 on 5/7 Diuretics being held by nephro  Appreciate nephro assist 10.Hyperlipidemia. Lipitor 11.Hypertension. Lopressor 25 mg twice daily. Monitor with increased mobility Vitals:   04/30/19 0502 04/30/19 0835  BP: (!) 118/45   Pulse: 83 74  Resp: 20   Temp: 98.6 F (37 C)   SpO2: 97%    Controlled 5/10 12.OSA. Continue CPAP 13.  Acute blood loss anemia  Hemoglobin 9.5 on 4/29, 10.2 on 5/5  Continue to monitor 14.  Constipation alternating with loose stools, resolved    12 days A FACE TO FACE EVALUATION WAS PERFORMED  Ranelle Oyster 04/30/2019, 8:53 AM

## 2019-04-30 NOTE — Progress Notes (Signed)
[facility]    NEPHROLOGY PROGRESS NOTE  SUBJECTIVE: Patient resting comfortably.  Has no specific complaints other than right leg weakness.  Answers yes no to questions.  Denies any chest pain, shortness of breath, nausea, vomiting, diarrhea or dysuria.  All other review of systems are negative.  OBJECTIVE:  Vitals:   04/30/19 0502 04/30/19 0835  BP: (!) 118/45   Pulse: 83 74  Resp: 20   Temp: 98.6 F (37 C)   SpO2: 97%     Intake/Output Summary (Last 24 hours) at 04/30/2019 1218 Last data filed at 04/30/2019 1047 Gross per 24 hour  Intake 460 ml  Output 975 ml  Net -515 ml      General: Alert, answers yes no to questions, in no acute distress, expressive a aphasia HEENT: MMM [place] AT anicteric sclera Neck:  No JVD, no adenopathy CV:  Heart RRR  Lungs:  L/S CTA bilaterally Abd:  abd SNT/ND with normal BS GU:  Bladder non-palpable, Foley with clear urine Extremities:  No LE edema. Skin:  No skin rash  MEDICATIONS:  . aspirin  300 mg Rectal Daily   Or  . aspirin  325 mg Oral Daily  . atorvastatin  80 mg Oral q1800  . blood pressure control book   Does not apply Once  . clopidogrel  75 mg Oral Daily  . heparin  5,000 Units Subcutaneous Q8H  . metoprolol tartrate  25 mg Oral BID  . patiromer  25.2 g Oral Daily       LABS:   CBC Latest Ref Rng & Units 04/25/2019 04/19/2019 04/18/2019  WBC 4.0 - 10.5 K/uL 10.3 10.1 9.1  Hemoglobin 13.0 - 17.0 g/dL 10.2(L) 9.5(L) 9.1(L)  Hematocrit 39.0 - 52.0 % 33.6(L) 31.6(L) 30.5(L)  Platelets 150 - 400 K/uL 290 233 230    CMP Latest Ref Rng & Units 04/27/2019 04/26/2019 04/25/2019  Glucose 70 - 99 mg/dL 161(W102(H) 90 99  BUN 8 - 23 mg/dL 96(E79(H) 45(W86(H) 09(W92(H)  Creatinine 0.61 - 1.24 mg/dL 1.19(J4.11(H) 4.78(G4.72(H) 9.56(O5.22(H)  Sodium 135 - 145 mmol/L 135 136 136  Potassium 3.5 - 5.1 mmol/L 4.4 4.4 5.1  Chloride 98 - 111 mmol/L 100 97(L) 95(L)  CO2 22 - 32 mmol/L 23 23 28   Calcium 8.9 - 10.3 mg/dL 8.9 1.3(Y8.8(L) 9.4  Total Protein 6.5 - 8.1  g/dL - - -  Total Bilirubin 0.3 - 1.2 mg/dL - - -  Alkaline Phos 38 - 126 U/L - - -  AST 15 - 41 U/L - - -  ALT 0 - 44 U/L - - -    Lab Results  Component Value Date   CALCIUM 8.9 04/27/2019   CAION 1.20 09/09/2018   PHOS 4.7 (H) 04/27/2019       Component Value Date/Time   COLORURINE YELLOW 04/24/2019 1730   APPEARANCEUR CLEAR 04/24/2019 1730   LABSPEC 1.013 04/24/2019 1730   PHURINE 6.0 04/24/2019 1730   GLUCOSEU NEGATIVE 04/24/2019 1730   HGBUR SMALL (A) 04/24/2019 1730   BILIRUBINUR NEGATIVE 04/24/2019 1730   KETONESUR NEGATIVE 04/24/2019 1730   PROTEINUR NEGATIVE 04/24/2019 1730   UROBILINOGEN 0.2 04/23/2010 1049   NITRITE NEGATIVE 04/24/2019 1730   LEUKOCYTESUR NEGATIVE 04/24/2019 1730      Component Value Date/Time   TCO2 31 09/09/2018 0823       Component Value Date/Time   IRON 121 04/05/2019 1313   TIBC 199 (L) 04/05/2019 1313   FERRITIN 509 (H) 04/05/2019 1313   IRONPCTSAT 61 (H)  04/05/2019 1313       ASSESSMENT/PLAN:    68 year old male patient with a history of hypertension and chronic kidney disease stage III with a baseline serum creatinine around 1.7 in September 2019 who presents with an acute CVA.  He was found to have bladder outlet obstruction and acute kidney injury.  1.  Chronic kidney disease stage III.  His baseline serum creatinine is around 1.7 from September 2009.  His urinalysis is negative for protein.  Renal ultrasound from 04/05/2019 revealed bilateral hydronephrosis.  2.  Acute kidney injury.  Likely secondary to bladder outlet obstruction.  Renal function is slowly improving.  Maintain Foley per urology.  3.  Acute CVA.  Continue PT/OT  4.  Hyperkalemia.  Likely secondary to obstruction.  Improved.  Okay for disposition from renal standpoint.  Needs follow-up with urology and nephrology as an outpatient.  [address] [place], DO, [place]

## 2019-05-01 ENCOUNTER — Inpatient Hospital Stay (HOSPITAL_COMMUNITY): Payer: Medicare HMO | Admitting: Occupational Therapy

## 2019-05-01 ENCOUNTER — Inpatient Hospital Stay (HOSPITAL_COMMUNITY): Payer: Medicare HMO

## 2019-05-01 LAB — BASIC METABOLIC PANEL
Anion gap: 14 (ref 5–15)
BUN: 81 mg/dL — ABNORMAL HIGH (ref 8–23)
CO2: 19 mmol/L — ABNORMAL LOW (ref 22–32)
Calcium: 9.4 mg/dL (ref 8.9–10.3)
Chloride: 101 mmol/L (ref 98–111)
Creatinine, Ser: 3.85 mg/dL — ABNORMAL HIGH (ref 0.61–1.24)
GFR calc Af Amer: 17 mL/min — ABNORMAL LOW (ref >60)
GFR calc non Af Amer: 15 mL/min — ABNORMAL LOW (ref >60)
Glucose, Bld: 104 mg/dL — ABNORMAL HIGH (ref 70–99)
Potassium: 3.8 mmol/L (ref 3.5–5.1)
Sodium: 134 mmol/L — ABNORMAL LOW (ref 135–145)

## 2019-05-01 NOTE — Progress Notes (Signed)
Occupational Therapy Session Note  Patient Details  Name: Gregory [name] MRN: [id number] Date of Birth: 1951-01-18  Today's Date: 05/01/2019 OT Individual Time: 0800-0902 OT Individual Time Calculation (min): 62 min    Short Term Goals: Week 2:  OT Short Term Goal 1 (Week 2): Pt will complete toilet transfer with mod assist. OT Short Term Goal 2 (Week 2): Pt will complete UB dressing with mod assist OT Short Term Goal 3 (Week 2): Pt will complete LB bathing with mod assist sit to stand.   OT Short Term Goal 4 (Week 2): Pt will complete LB dressing with mod assist for pulling pants over hips.    Skilled Therapeutic Interventions/Progress Updates:    Pt needed max demonstrational cueing with max assist to roll to the left and complete sidelying to sit on the EOB.  Mod assist for squat pivot transfer to the left for transfer to the wheelchair.  Pt voiced the desire to shave so positioned pt over at the sink in the wheelchair to work on washing his face and shave.  He was able to wash his face with setup and mod instructional cueing for setup of washcloth with soap secondary to motor planning issue.  He needed mod assist for shaving secondary to thoroughness and needing assist to position the razor and shave the right side of his face.  He then completed bathing with min assist for UB and max assist for LB sit to stand.  He was able to complete sit to stand with mod assist but still needs max assist to maintain standing balance and complete washing buttocks and peri area as well as for pulling brief and pants over hips.  Max assist to start these garments over his feet as well and for donning gripper socks.  Pt still with increased flexor tone in the right elbow as well as in the right hamstrings, noted with standing.  He also demonstrates increased trunk flexion in standing with decreased ability to achieve and maintain trunk extension in standing.    Therapy Documentation Precautions:   Precautions Precautions: Fall Precaution Comments: Expressive aphasia, R hemiparesis Restrictions Weight Bearing Restrictions: No   Pain: Pain Assessment Pain Scale: 0-10 Pain Score: 0-No pain ADL: See Care Tool Section for some details of ADL  Therapy/Group: Individual Therapy  Samira Acero OTR/L 05/01/2019, 11:43 AM

## 2019-05-01 NOTE — Progress Notes (Signed)
PHYSICAL MEDICINE & REHABILITATION PROGRESS NOTE   Subjective/Complaints:  No issues overnight, remains severely aphasic.  Appreciate renal notes.  ROS: limited due to language/communication    Objective:   No results found. No results for input(s): WBC, HGB, HCT, PLT in the last 72 hours. Recent Labs    05/01/19 1038  NA 134*  K 3.8  CL 101  CO2 19*  GLUCOSE 104*  BUN 81*  CREATININE 3.85*  CALCIUM 9.4    Intake/Output Summary (Last 24 hours) at 05/01/2019 1607 Last data filed at 05/01/2019 1251 Gross per 24 hour  Intake 480 ml  Output 725 ml  Net -245 ml     Physical Exam: Vital Signs Blood pressure (!) 121/55, pulse 83, temperature 98.3 F (36.8 C), temperature source Oral, resp. rate 20, height 5\' 4"  (1.626 m), weight 81.4 kg, SpO2 99 %. Constitutional: No distress . Vital signs reviewed. HEENT: EOMI, oral membranes moist Neck: supple Cardiovascular: RRR without murmur. No JVD    Respiratory: CTA Bilaterally without wheezes or rales. Normal effort    GI: BS +, non-tender, non-distended . Musc: No edema or tenderness in extremities. Neurologic: Alert Motor: LLE: 5/5 proximal distal RLE: 3-/5 proximal to distal  Expressive aphasia, nods y/n  Assessment/Plan: 1. 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  Physiatrist is providing close team supervision and 24 hour management of active medical problems listed below.  Physiatrist and rehab team continue to assess barriers to discharge/monitor patient progress toward functional and medical goals  Care Tool:  Bathing    Body parts bathed by patient: Right arm, Chest, Abdomen, Front perineal area, Left upper leg, Right upper leg, Face   Body parts bathed by helper: Right lower leg, Left lower leg, Buttocks, Left arm Body parts n/a: Right lower leg, Left  lower leg   Bathing assist Assist Level: Moderate Assistance - Patient 50 - 74%     Upper Body Dressing/Undressing Upper body dressing   What is the patient wearing?: Pull over shirt    Upper body assist Assist Level: Minimal Assistance - Patient > 75%    Lower Body Dressing/Undressing Lower body dressing      What is the patient wearing?: Pants     Lower body assist Assist for lower body dressing: Maximal Assistance - Patient 25 - 49%     Toileting Toileting    Toileting assist Assist for toileting: Moderate Assistance - Patient 50 - 74%     Transfers Chair/bed transfer  Transfers assist     Chair/bed transfer assist level: Maximal Assistance - Patient 25 - 49%(to the left squat pivot)     Locomotion Ambulation   Ambulation assist   Ambulation activity did not occur: Safety/medical concerns  Assist level: 2 helpers Assistive device: Other (comment)(3 Investment banker, corporatemuskateer) Max distance: [id number]   Walk 10 feet activity   Assist  Walk 10 feet activity did not occur: Safety/medical concerns  Assist level: 2 helpers Assistive device: Other (comment)(3 muskateer)   Walk 50 feet activity   Assist Walk 50 feet with 2 turns activity did not occur: Safety/medical concerns         Walk 150 feet activity   Assist Walk 150 feet activity did not occur: Safety/medical concerns         Walk 10 feet on uneven surface  activity   Assist Walk 10 feet on uneven surfaces  activity did not occur: Safety/medical concerns         Wheelchair     Assist Will patient use wheelchair at discharge?: Yes Type of Wheelchair: Manual    Wheelchair assist level: Dependent - Patient 0% Max wheelchair distance: 150 ft    Wheelchair 50 feet with 2 turns activity    Assist        Assist Level: Dependent - Patient 0%   Wheelchair 150 feet activity     Assist     Assist Level: Dependent - Patient 0%    Medical Problem List and Plan: 1.Right  hemiparesis and aphasia/dysphagiasecondary to left insular and posterior frontal infarct embolic secondary to non-STEMI. Status post loop recorder  Continue CIR PT, OT, SLP   2. Antithrombotics: -DVT/anticoagulation:Subcutaneous heparin. -antiplatelet therapy: Aspirin 325 mg daily, Plavix 75 mg daily 3. Pain Management:Tylenol as needed 4. Mood:Provide emotional support -antipsychotic agents: N/A 5. Neuropsych: This patientisnot fully capable of making decisions on hisown behalf. 6. Skin/Wound Care:Routine skin checks 7. Fluids/Electrolytes/Nutrition:Routine in and outs  Hyperkalemia, improved on [place], this has now been discontinued and potassium continues to fall, nephrology has signed off 8.Dysphagia. Dysphagia #1 honey thick liquids.   Advanced to D2 thins, continue to advance as tolerated   Monitor for aspiration    Off IVF currently 9.Acute on chronic kidney disease stage III/hyperkalemia with obstructive uropathy/BPH. Follow-up urology services as well as nephrology services. -FOLEY TUBE TO REMAIN IN PLACE UNTIL FOLLOW UP OUT PATIENT WITH UROLOGY  Creatinine 5.14 on 5/3 to 4.11 on 5/7 Diuretics being held by nephro  Appreciate nephro assist 10.Hyperlipidemia. Lipitor 11.Hypertension. Lopressor 25 mg twice daily. Monitor with increased mobility Vitals:   05/01/19 0448 05/01/19 1437  BP: 115/63 (!) 121/55  Pulse: 77 83  Resp: 20 20  Temp: 97.7 F (36.5 C) 98.3 F (36.8 C)  SpO2: 100% 99%   Controlled 5/11 12.OSA. Continue CPAP 13.  Acute blood loss anemia  Hemoglobin 9.5 on 4/29, 10.2 on 5/5  Continue to monitor 14.  Constipation alternating with loose stools, resolved    13 days A FACE TO FACE EVALUATION WAS PERFORMED  Gregory [name] 05/01/2019, 4:07 PM

## 2019-05-01 NOTE — Progress Notes (Signed)
Occupational Therapy Session Note  Patient Details  Name: Gregory [name] MRN: [id number] Date of Birth: 02/20/1951  Today's Date: 05/01/2019 OT Individual Time: 1500-1530 OT Individual Time Calculation (min): 30 min    Short Term Goals: Week 2:  OT Short Term Goal 1 (Week 2): Pt will complete toilet transfer with mod assist. OT Short Term Goal 2 (Week 2): Pt will complete UB dressing with mod assist OT Short Term Goal 3 (Week 2): Pt will complete LB bathing with mod assist sit to stand.   OT Short Term Goal 4 (Week 2): Pt will complete LB dressing with mod assist for pulling pants over hips.    Skilled Therapeutic Interventions/Progress Updates:    Pt completed transfer from the wheelchair to the bed squat pivot to the left with max assist.  He demonstrates pushing strategies to the right during transfer and just barely positioned his buttocks on the bed when sitting.  He scooted over to the left some more with mod assist and then transitioned to supine at the same level.  Once in supine, NMES was applied to right triceps in hope of reducing flexor tone in the left elbow.  He was able to tolerate 15 mins of stimulation without any adverse reactions.  Intensity set on level 5 of custom program with on/off time 10/5, PPS at 35 and Pulse width at 300.  Pt was cued to help active elbow extension with stimulus.  Finished session with pt in bed and bed alarm in place.  RUE positioned on pillows with call button and phone in reach.    Therapy Documentation Precautions:  Precautions Precautions: Fall Precaution Comments: Expressive aphasia, R hemiparesis Restrictions Weight Bearing Restrictions: No  Pain: Pain Assessment Pain Scale: Faces Pain Score: 0-No pain ADL: See Care tool Section for some details of mobility  Therapy/Group: Individual Therapy  Seymore Brodowski OTR/L 05/01/2019, 3:31 PM

## 2019-05-01 NOTE — Progress Notes (Signed)
[facility]    NEPHROLOGY PROGRESS NOTE  SUBJECTIVE: Patient resting comfortably.  Has no specific complaints other than right leg weakness.  Answers yes no to questions.  Denies any chest pain, shortness of breath, nausea, vomiting, diarrhea or dysuria.  All other review of systems are negative.  OBJECTIVE:  Vitals:   04/30/19 1932 05/01/19 0448  BP: (!) 136/54 115/63  Pulse: 87 77  Resp: 18 20  Temp: 98.4 F (36.9 C) 97.7 F (36.5 C)  SpO2: 100% 100%    Intake/Output Summary (Last 24 hours) at 05/01/2019 1238 Last data filed at 05/01/2019 0801 Gross per 24 hour  Intake 240 ml  Output 625 ml  Net -385 ml      General: Alert, answers yes no to questions, in no acute distress, expressive a aphasia HEENT: MMM [place] AT anicteric sclera Neck:  No JVD, no adenopathy CV:  Heart RRR  Lungs:  L/S CTA bilaterally Abd:  abd SNT/ND with normal BS GU:  Bladder non-palpable, Foley with clear urine Extremities:  No LE edema. Skin:  No skin rash  MEDICATIONS:  . aspirin  300 mg Rectal Daily   Or  . aspirin  325 mg Oral Daily  . atorvastatin  80 mg Oral q1800  . blood pressure control book   Does not apply Once  . clopidogrel  75 mg Oral Daily  . heparin  5,000 Units Subcutaneous Q8H  . metoprolol tartrate  25 mg Oral BID  . patiromer  25.2 g Oral Daily       LABS:   CBC Latest Ref Rng & Units 04/25/2019 04/19/2019 04/18/2019  WBC 4.0 - 10.5 K/uL 10.3 10.1 9.1  Hemoglobin 13.0 - 17.0 g/dL 10.2(L) 9.5(L) 9.1(L)  Hematocrit 39.0 - 52.0 % 33.6(L) 31.6(L) 30.5(L)  Platelets 150 - 400 K/uL 290 233 230    CMP Latest Ref Rng & Units 05/01/2019 04/27/2019 04/26/2019  Glucose 70 - 99 mg/dL 742(V104(H) 956(L102(H) 90  BUN 8 - 23 mg/dL 87(F81(H) 64(P79(H) 32(R86(H)  Creatinine 0.61 - 1.24 mg/dL 5.18(A3.85(H) 4.16(S4.11(H) 0.63(K4.72(H)  Sodium 135 - 145 mmol/L 134(L) 135 136  Potassium 3.5 - 5.1 mmol/L 3.8 4.4 4.4  Chloride 98 - 111 mmol/L 101 100 97(L)  CO2 22 - 32 mmol/L 19(L) 23 23  Calcium 8.9 - 10.3 mg/dL  9.4 8.9 1.6(W8.8(L)  Total Protein 6.5 - 8.1 g/dL - - -  Total Bilirubin 0.3 - 1.2 mg/dL - - -  Alkaline Phos 38 - 126 U/L - - -  AST 15 - 41 U/L - - -  ALT 0 - 44 U/L - - -    Lab Results  Component Value Date   CALCIUM 9.4 05/01/2019   CAION 1.20 09/09/2018   PHOS 4.7 (H) 04/27/2019       Component Value Date/Time   COLORURINE YELLOW 04/24/2019 1730   APPEARANCEUR CLEAR 04/24/2019 1730   LABSPEC 1.013 04/24/2019 1730   PHURINE 6.0 04/24/2019 1730   GLUCOSEU NEGATIVE 04/24/2019 1730   HGBUR SMALL (A) 04/24/2019 1730   BILIRUBINUR NEGATIVE 04/24/2019 1730   KETONESUR NEGATIVE 04/24/2019 1730   PROTEINUR NEGATIVE 04/24/2019 1730   UROBILINOGEN 0.2 04/23/2010 1049   NITRITE NEGATIVE 04/24/2019 1730   LEUKOCYTESUR NEGATIVE 04/24/2019 1730      Component Value Date/Time   TCO2 31 09/09/2018 0823       Component Value Date/Time   IRON 121 04/05/2019 1313   TIBC 199 (L) 04/05/2019 1313   FERRITIN 509 (H) 04/05/2019 1313  IRONPCTSAT 61 (H) 04/05/2019 1313       ASSESSMENT/PLAN:    68 year old male patient with a history of hypertension and chronic kidney disease stage III with a baseline serum creatinine around 1.7 in September 2019 who presents with an acute CVA.  He was found to have bladder outlet obstruction and acute kidney injury.  1.  Chronic kidney disease stage III.  His baseline serum creatinine is around 1.7 from September 2009.  His urinalysis is negative for protein.  Renal ultrasound from 04/05/2019 revealed bilateral hydronephrosis.  2.  Acute kidney injury.  Likely secondary to bladder outlet obstruction.  Renal function is slowly improving.  Maintain Foley per urology.  3.  Acute CVA.  Continue PT/OT  4.  Hyperkalemia.  Likely secondary to obstruction.  Improved.  Will discontinue potassium more.  Okay for disposition from renal standpoint.  Needs follow-up with urology and nephrology as an outpatient.  We will sign off.  Please call with questions.  [address]  [place], DO, [place]

## 2019-05-01 NOTE — Progress Notes (Signed)
Physical Therapy Session Note  Patient Details  Name: Gregory [name] MRN: [id number] Date of Birth: Dec 01, 1951  Today's Date: 05/01/2019 PT Individual Time: 1132-1230 PT Individual Time Calculation (min): 58 min   Short Term Goals: Week 2:  PT Short Term Goal 1 (Week 2): Pt will perform sit<>stand with RW and min assist PT Short Term Goal 2 (Week 2): Pt will perform bed<>chair transfer with min assist PT Short Term Goal 3 (Week 2): Pt will ambulated x 10 ft with max assist and LRAD  Skilled Therapeutic Interventions/Progress Updates:    Pt supine in bed upon PT arrival, agreeable to therapy tx and denies pain. Pt transferred to sitting EOB with min assist, stand pivot to w/c with mod assist and pt transported to the gym. Pt performed sit<>stands this session from w/c with RW and min-mod assist, cues for techniques and upright posture, mirror for visual feedback. In standing pt worked on R LE weightbearing and stance control to perform stepping in place with L LE, RW for UE support and min assist. Pt performed stand pivot to the mat with mod assist and RW towards the L, cues for techniques. Pt performed x 3 sit<>stands from edge of mat with min-mod assist and RW working on LE strength and weightbearing. Pt worked on standing balance and L lateral weightshifting to prevent R LOB while performing reaching task on L side, x 2 trials with cues for techniques and manual facilitation for L lateral weightshift. Pt worked on pre-gait while standing with RW and min-mod assist, pt took x 5 steps forward/back with L LE and x 2 steps with R LE, increased tone in R LE causing adduction and requiring assist from therapist for foot placement to prevent scissoring. Pt worked on seated balance and trunk control to lean laterally to the R touching R shoulder to target and then returning to midline x 10 with emphasis on maintaining control to limit R lateral LOB. Pt worked on seated balance and anterior weightshifting  while performing reaching task outside BOS for cups and then placing them on the floor, cues for maintaining midline when leaning forward to place on floor as pt demonstrates R lateral bias during this, x 2 trials with CGA. Therapist performed hamstring, heel cord and hip adductor stretching this session x 1 min each for tone management. Stand pivot to the L with RW and mod assist, transported back to room and left seated in w/c with needs in reach and chair alarm set.   Therapy Documentation Precautions:  Precautions Precautions: Fall Precaution Comments: Expressive aphasia, R hemiparesis Restrictions Weight Bearing Restrictions: No    Therapy/Group: Individual Therapy  Cresenciano Genre, PT, DPT 05/01/2019, 7:53 AM

## 2019-05-01 NOTE — Progress Notes (Signed)
Speech Language Pathology Daily Session Note  Patient Details  Name: Gregory [name] MRN: [id number] Date of Birth: [birth year]  Today's Date: 05/01/2019 SLP Individual Time: 0900-1000 SLP Individual Time Calculation (min): 60 min  Short Term Goals: Week 2: SLP Short Term Goal 1 (Week 2): Pt will tolerate Dys2 diet and thin liquids without overt s/s aspiration given min A verbal and visual cues for use of safe swallow strategies. SLP Short Term Goal 2 (Week 2): Pt will produce 3-5 vowel sounds and or C-V combinations with 50% accuracy given max verbal/visual cues. SLP Short Term Goal 3 (Week 2): Pt will demonstrate comprehension of functional written information with 80% accuracy given min cues  Skilled Therapeutic Interventions:  Skilled ST services focused on language skills. SLP facilitated expression of wants via response to yes/no questions to update food peference list to accommodate upgraded dys 2 diet. SLP posted updated list outside room and called kitchen to note request for staff to take order in response to yes/no questions.  Pt attempted to spell requested item on letter board, pt required max A multimodal cues to locate letters confirmed with response to yes/no questions. SLP facilitated letter recognization utilizing letter board, pt was able to locate 25 out 26 letters with Mod A written cues of requested letter. Pt demonstrated reading comprehension at phrase level identifying described object in a field of 3 in 5 out 5 opportunities. Pt was left in room with call bell within reach and chair alarm set. ST recommends to continue skilled ST services.      Pain Pain Assessment Pain Scale: Faces Pain Score: 0-No pain  Therapy/Group: Individual Therapy  Jesalyn Finazzo  [facility] 05/01/2019, 4:21 PM

## 2019-05-02 ENCOUNTER — Inpatient Hospital Stay (HOSPITAL_COMMUNITY): Payer: Self-pay

## 2019-05-02 ENCOUNTER — Inpatient Hospital Stay (HOSPITAL_COMMUNITY): Payer: Medicare HMO

## 2019-05-02 ENCOUNTER — Inpatient Hospital Stay (HOSPITAL_COMMUNITY): Payer: Medicare HMO | Admitting: Occupational Therapy

## 2019-05-02 NOTE — Progress Notes (Signed)
Speech Language Pathology Daily Session Note  Patient Details  Name: Gregory [name] MRN: [id number] Date of Birth: Sep 10, 1951  Today's Date: 05/02/2019 SLP Individual Time: 3009-2330 SLP Individual Time Calculation (min): 58 min  Short Term Goals: Week 2: SLP Short Term Goal 1 (Week 2): Pt will tolerate Dys2 diet and thin liquids without overt s/s aspiration given min A verbal and visual cues for use of safe swallow strategies. SLP Short Term Goal 2 (Week 2): Pt will produce 3-5 vowel sounds and or C-V combinations with 50% accuracy given max verbal/visual cues. SLP Short Term Goal 3 (Week 2): Pt will demonstrate comprehension of functional written information with 80% accuracy given min cues  Skilled Therapeutic Interventions: Skilled ST services focused on swallow and language skills. SLP facilitated PO consumption trials of dys 3, pt demonstrated mild right anterior spillage with supervision a verbal cues to utilize finger sweeps to clear right buccal cavity. SLP facilitated naming common items on black/white picture cards, pt demonstrated ability to initially verbally name 3 out 5 items (car, cat and key), however was unable to repeat words on command. Pt demonstrated ability to write 2 out 5 words from picture cards (pan, bee) given underlined letter spaces and verbal repetition of word. Pt demonstrated ability to spell CVC words (from picture cards) on alphabet board, in 12 out 15 letter opportunities, with 20% written and phonemic cues. Pt demonstrated ability to locate 24 out 26 letters on alphabet board with 35% written and repetition cues and error awareness with mod A verbal cues. Pt was left in room with call bell within reach and chair alarm set. ST recommends to continue skilled ST services.      Pain Pain Assessment Pain Score: 0-No pain  Therapy/Group: Individual Therapy  [name]  [facility] 05/02/2019, 5:13 PM

## 2019-05-02 NOTE — Progress Notes (Signed)
Physical Therapy Session Note  Patient Details  Name: Gregory [name] MRN: [id number] Date of Birth: 28-Jun-1951  Today's Date: 05/02/2019 PT Individual Time: 8937-3428 PT Individual Time Calculation (min): 73 min   Short Term Goals: Week 2:  PT Short Term Goal 1 (Week 2): Pt will perform sit<>stand with RW and min assist PT Short Term Goal 2 (Week 2): Pt will perform bed<>chair transfer with min assist PT Short Term Goal 3 (Week 2): Pt will ambulated x 10 ft with max assist and LRAD  Skilled Therapeutic Interventions/Progress Updates:    Pt supine in bed upon PT arrival, agreeable to therapy tx and denies pain at rest. Pt transferred to sitting EOB with min assist, squat pivot to w/c with mod assist. Pt transported to the gym. Pt performed stand pivot to the mat with RW and mod assist, cues for techniques. Pt transferred supine<>sit on the mat this session with mod assist. In supine therapist performed R UE and R LE PROM and stretching for tone management: elbow extension and shoulder flexion stretching, finger/wrist extension stretching, hamstring and adductor stretching. Pt worked on sit<>stand with RW and mod assist, cues and manual facilitation for increased hip/trunk extension. Pt ambulated x 5 ft with 3 musketeers technique, max assist +2 with therapist assisting with R LE placement secondary to tone/spasticity, pt maintains flexed posture throughout and tone appears to be limiting functional steps with R LE. Pt transferred to tall kneeling on mat with B UE support on bench through elbows, max +2 assist. Pt requiring max assist for increased trunk and hip extension, unable to come up to tall kneeling. Pt performed L UE reaches while in modified quadruped position on elbows using bench, working on R lateral weightshifting and weightbearing. From modified quadruped pt transferred to prone over red wedge with max assist +2. In prone position on red wedge for hip flexor stretching, manual  overpressure applied through hips. In prone pt performed R LE active assisted hamstring curls with emphasis on isolated muscle activation and minimizing tone. Pt transferred prone>supine>sitting with max assist. Pt performed x 2 sit<>stands with RW and mod assist, cues for increased extension. Pt ambulated x 5 ft using 3 musketeers technique, max assist +2. Pt ambulated x 8 ft using L rail with targets on floor for increased step length, continued assist for R LE placement secondary to tone, pt with continued trunk/hip flexion. Pt transported back to room, squat pivot to recliner mod assist. Pt left in recliner at end of session with needs in reach and chair alarm set.   Therapy Documentation Precautions:  Precautions Precautions: Fall Precaution Comments: Expressive aphasia, R hemiparesis Restrictions Weight Bearing Restrictions: No   Therapy/Group: Individual Therapy  Cresenciano Genre, PT, DPT 05/02/2019, 10:33 AM

## 2019-05-02 NOTE — Plan of Care (Signed)
  Problem: Consults Goal: RH STROKE PATIENT EDUCATION Description See Patient Education module for education specifics  Outcome: Progressing   Problem: RH BOWEL ELIMINATION Goal: RH STG MANAGE BOWEL WITH ASSISTANCE Description STG Manage Bowel with Mod Assistance.  Outcome: Progressing   Problem: RH BLADDER ELIMINATION Goal: RH STG MANAGE BLADDER WITH EQUIPMENT WITH ASSISTANCE Description STG Manage Bladder With Equipment With Max Assistance  Outcome: Progressing   Problem: RH SKIN INTEGRITY Goal: RH STG SKIN FREE OF INFECTION/BREAKDOWN Description No new breakdown with min assist   Outcome: Progressing   Problem: RH SAFETY Goal: RH STG ADHERE TO SAFETY PRECAUTIONS W/ASSISTANCE/DEVICE Description STG Adhere to Safety Precautions With Min Assistance/Device.  Outcome: Progressing   Problem: RH COGNITION-NURSING Goal: RH STG USES MEMORY AIDS/STRATEGIES W/ASSIST TO PROBLEM SOLVE Description STG Uses Memory Aids/Strategies With Min Assistance to Problem Solve.  Outcome: Progressing   Problem: RH PAIN MANAGEMENT Goal: RH STG PAIN MANAGED AT OR BELOW PT'S PAIN GOAL Description < 3 out of 10.    Outcome: Progressing

## 2019-05-02 NOTE — Progress Notes (Signed)
Occupational Therapy Session Note  Patient Details  Name: Gregory [name] MRN: [id number] Date of Birth: 1951-07-06  Today's Date: 05/02/2019 OT Individual Time: 0801-0900 OT Individual Time Calculation (min): 59 min    Short Term Goals: Week 2:  OT Short Term Goal 1 (Week 2): Pt will complete toilet transfer with mod assist. OT Short Term Goal 2 (Week 2): Pt will complete UB dressing with mod assist OT Short Term Goal 3 (Week 2): Pt will complete LB bathing with mod assist sit to stand.   OT Short Term Goal 4 (Week 2): Pt will complete LB dressing with mod assist for pulling pants over hips.    Skilled Therapeutic Interventions/Progress Updates:    Pt completed shower and dressing sit to stand at the sink.  He was able to complete stand pivot transfer to the tub bench with max assist for showering.  Mod instructional cueing for sequencing with overall mod assist to complete all bathing in sitting with max assist for standing to wash buttocks.  Integrated LH sponge with min instructional cueing for washing his feet and lower legs.  Max hand over hand assist needed to use the RUE for washing the left arm secondary to decreased motor function and increased elbow flexor tone.  Next, had pt complete transfer back to the wheelchair with max assist stand pivot.  Worked on dressing at the sink with mod assist for donning pullover shirt with max instructional cueing to start with the impaired RUE.  He was able to integrate use of the reacher, for threading pants with min assist as well as use of the sockaide with mod assist and max demonstrational cueing for LB dressing.  Mod assist needed for sit to stand to pull pants over his hips as well.  Finished session with pt completing stand pivot transfer back to the bed and being left with call button and phone in reach with safety alarm in place.    Therapy Documentation Precautions:  Precautions Precautions: Fall Precaution Comments: Expressive aphasia,  R hemiparesis Restrictions Weight Bearing Restrictions: No  Pain: Pain Assessment Pain Scale: Faces Pain Score: 0-No pain ADL: See Care Tool Section for some details of ADL  Therapy/Group: Individual Therapy  Breena Bevacqua OTR/L 05/02/2019, 9:01 AM

## 2019-05-02 NOTE — Progress Notes (Signed)
[place] PHYSICAL MEDICINE & REHABILITATION PROGRESS NOTE   Subjective/Complaints:  Severe aphasia, as per nursing remains incontinent of bowel, has chronic Foley for bladder, history of postobstructive uropathy  ROS: limited due to language/communication    Objective:   No results found. No results for input(s): WBC, HGB, HCT, PLT in the last 72 hours. Recent Labs    05/01/19 1038  NA 134*  K 3.8  CL 101  CO2 19*  GLUCOSE 104*  BUN 81*  CREATININE 3.85*  CALCIUM 9.4    Intake/Output Summary (Last 24 hours) at 05/02/2019 0925 Last data filed at 05/02/2019 0759 Gross per 24 hour  Intake 420 ml  Output 875 ml  Net -455 ml     Physical Exam: Vital Signs Blood pressure (!) 132/58, pulse 82, temperature 97.8 F (36.6 C), resp. rate 20, height 5\' 4"  (1.626 m), weight 80.8 kg, SpO2 98 %. Constitutional: No distress . Vital signs reviewed. HEENT: EOMI, oral membranes moist Neck: supple Cardiovascular: RRR without murmur. No JVD    Respiratory: CTA Bilaterally without wheezes or rales. Normal effort    GI: BS +, non-tender, non-distended . Musc: No edema or tenderness in extremities. Neurologic: Alert Motor: LLE: 5/5 proximal distal RLE: 3-/5 proximal to distal  Expressive aphasia, nods y/n  Assessment/Plan: 1. 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  Physiatrist is providing close team supervision and 24 hour management of active medical problems listed below.  Physiatrist and rehab team continue to assess barriers to discharge/monitor patient progress toward functional and medical goals  Care Tool:  Bathing    Body parts bathed by patient: Right arm, Chest, Abdomen, Front perineal area, Left upper leg, Right upper leg, Face, Right lower leg, Left lower leg   Body parts bathed by helper: Buttocks, Left arm Body  parts n/a: Right lower leg, Left lower leg   Bathing assist Assist Level: Maximal Assistance - Patient 24 - 49%     Upper Body Dressing/Undressing Upper body dressing   What is the patient wearing?: Pull over shirt    Upper body assist Assist Level: Moderate Assistance - Patient 50 - 74%    Lower Body Dressing/Undressing Lower body dressing      What is the patient wearing?: Pants     Lower body assist Assist for lower body dressing: Maximal Assistance - Patient 25 - 49%     Toileting Toileting    Toileting assist Assist for toileting: Moderate Assistance - Patient 50 - 74%     Transfers Chair/bed transfer  Transfers assist     Chair/bed transfer assist level: Maximal Assistance - Patient 25 - 49%(stand pivot)     Locomotion Ambulation   Ambulation assist   Ambulation activity did not occur: Safety/medical concerns  Assist level: 2 helpers Assistive device: Other (comment)(3 Investment banker, corporate) Max distance: 4ft   Walk 10 feet activity   Assist  Walk 10 feet activity did not occur: Safety/medical concerns  Assist level: 2 helpers Assistive device: Other (comment)(3 muskateer)   Walk 50 feet activity   Assist Walk 50 feet with 2 turns activity did not occur: Safety/medical concerns         Walk 150 feet activity   Assist Walk 150 feet activity did not occur: Safety/medical concerns         Walk 10 feet on uneven surface  activity   Assist Walk 10 feet  on uneven surfaces activity did not occur: Safety/medical concerns         Wheelchair     Assist Will patient use wheelchair at discharge?: Yes Type of Wheelchair: Manual    Wheelchair assist level: Dependent - Patient 0% Max wheelchair distance: 150 ft    Wheelchair 50 feet with 2 turns activity    Assist        Assist Level: Dependent - Patient 0%   Wheelchair 150 feet activity     Assist     Assist Level: Dependent - Patient 0%    Medical Problem List and  Plan: 1.Right hemiparesis and aphasia/dysphagiasecondary to left insular and posterior frontal infarct embolic secondary to non-STEMI. Status post loop recorder  Continue CIR PT, OT, SLP, team conference in a.m. 2. Antithrombotics: -DVT/anticoagulation:Subcutaneous heparin. -antiplatelet therapy: Aspirin 325 mg daily, Plavix 75 mg daily 3. Pain Management:Tylenol as needed 4. Mood:Provide emotional support -antipsychotic agents: N/A 5. Neuropsych: This patientisnot fully capable of making decisions on hisown behalf. 6. Skin/Wound Care:Routine skin checks 7. Fluids/Electrolytes/Nutrition:Routine in and outs  Hyperkalemia, improved on Veltassa, this has now been discontinued and potassium continues to fall, nephrology has signed off 8.Dysphagia. Dysphagia #1 honey thick liquids.   Advanced to D2 thins, continue to advance as tolerated   Monitor for aspiration    Off IVF currently 9.Acute on chronic kidney disease stage III/hyperkalemia with obstructive uropathy/BPH. Follow-up urology services as well as nephrology services. -FOLEY TUBE TO REMAIN IN PLACE UNTIL FOLLOW UP OUT PATIENT WITH UROLOGY  Creatinine 5.14 on 5/3 to 4.11 on 5/7 Diuretics being held by nephro  Down to 3.85 on 5/11 10.Hyperlipidemia. Lipitor 11.Hypertension. Lopressor 25 mg twice daily. Monitor with increased mobility Vitals:   05/01/19 1957 05/02/19 0528  BP: (!) 115/54 (!) 132/58  Pulse: 87 82  Resp: 15 20  Temp: 98.3 F (36.8 C) 97.8 F (36.6 C)  SpO2: 98% 98%   Controlled 5/12 12.OSA. Continue CPAP 13.  Acute blood loss anemia  Hemoglobin 9.5 on 4/29, 10.2 on 5/5  Continue to monitor 14.  Constipation alternating with loose stools, resolved    14 days A FACE TO FACE EVALUATION WAS PERFORMED  Gregory [name] Gregory [name] 05/02/2019, 9:25 AM

## 2019-05-03 ENCOUNTER — Inpatient Hospital Stay (HOSPITAL_COMMUNITY): Payer: Self-pay

## 2019-05-03 ENCOUNTER — Inpatient Hospital Stay (HOSPITAL_COMMUNITY): Payer: Medicare HMO | Admitting: Occupational Therapy

## 2019-05-03 ENCOUNTER — Inpatient Hospital Stay (HOSPITAL_COMMUNITY): Payer: Medicare HMO | Admitting: Speech Pathology

## 2019-05-03 MED ORDER — DOCUSATE SODIUM 100 MG PO CAPS
100.0000 mg | ORAL_CAPSULE | Freq: Every day | ORAL | Status: DC
  Filled 2019-05-03: qty 1

## 2019-05-03 MED ORDER — DOCUSATE SODIUM 50 MG/5ML PO LIQD
100.0000 mg | Freq: Two times a day (BID) | ORAL | Status: DC
  Administered 2019-05-03 – 2019-05-11 (×3): 100 mg via ORAL
  Filled 2019-05-03 (×13): qty 10

## 2019-05-03 NOTE — Progress Notes (Addendum)
[place] PHYSICAL MEDICINE & REHABILITATION PROGRESS NOTE   Subjective/Complaints:  Severe aphasia,discussed need to increase po intake, pt likes Coke  ROS: limited due to language/communication    Objective:   No results found. No results for input(s): WBC, HGB, HCT, PLT in the last 72 hours. Recent Labs    05/01/19 1038  NA 134*  K 3.8  CL 101  CO2 19*  GLUCOSE 104*  BUN 81*  CREATININE 3.85*  CALCIUM 9.4    Intake/Output Summary (Last 24 hours) at 05/03/2019 0912 Last data filed at 05/03/2019 0440 Gross per 24 hour  Intake 444 ml  Output 900 ml  Net -456 ml     Physical Exam: Vital Signs Blood pressure 113/61, pulse 87, temperature 98.1 F (36.7 C), temperature source Oral, resp. rate 18, height _0  (1.626 m), weight 80.8 kg, SpO2 99 %. Constitutional: No distress . Vital signs reviewed. HEENT: EOMI, oral membranes moist Neck: supple Cardiovascular: RRR without murmur. No JVD    Respiratory: CTA Bilaterally without wheezes or rales. Normal effort    GI: BS +, non-tender, non-distended . Musc: No edema or tenderness in extremities. Neurologic: Alert Motor: LLE: 5/5 proximal distal RLE: 3-/5 proximal to distal  Expressive aphasia, nods y/n  Assessment/Plan: 1. 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  Physiatrist is providing close team supervision and 24 hour management of active medical problems listed below.  Physiatrist and rehab team continue to assess barriers to discharge/monitor patient progress toward functional and medical goals  Care Tool:  Bathing    Body parts bathed by patient: Right arm, Chest, Abdomen, Front perineal area, Left upper leg, Right upper leg, Face, Right lower leg, Left lower leg   Body parts bathed by helper: Buttocks, Left arm Body parts n/a: Right lower leg, Left lower  leg   Bathing assist Assist Level: Maximal Assistance - Patient 24 - 49%     Upper Body Dressing/Undressing Upper body dressing   What is the patient wearing?: Pull over shirt    Upper body assist Assist Level: Moderate Assistance - Patient 50 - 74%    Lower Body Dressing/Undressing Lower body dressing      What is the patient wearing?: Pants     Lower body assist Assist for lower body dressing: Maximal Assistance - Patient 25 - 49%     Toileting Toileting    Toileting assist Assist for toileting: Moderate Assistance - Patient 50 - 74%     Transfers Chair/bed transfer  Transfers assist     Chair/bed transfer assist level: Moderate Assistance - Patient 50 - 74%     Locomotion Ambulation   Ambulation assist   Ambulation activity did not occur: Safety/medical concerns  Assist level: 2 helpers Assistive device: Other (comment)(3 muskateer) Max distance: 5 ft   Walk 10 feet activity   Assist  Walk 10 feet activity did not occur: Safety/medical concerns  Assist level: 2 helpers Assistive device: Other (comment)(3 muskateer)   Walk 50 feet activity   Assist Walk 50 feet with 2 turns activity did not occur: Safety/medical concerns         Walk 150 feet activity   Assist Walk 150 feet activity did not occur: Safety/medical concerns         Walk 10 feet on uneven surface  activity   Assist Walk 10 feet on uneven surfaces activity did not  occur: Safety/medical concerns         Wheelchair     Assist Will patient use wheelchair at discharge?: Yes Type of Wheelchair: Manual    Wheelchair assist level: Dependent - Patient 0% Max wheelchair distance: 150 ft    Wheelchair 50 feet with 2 turns activity    Assist        Assist Level: Dependent - Patient 0%   Wheelchair 150 feet activity     Assist     Assist Level: Dependent - Patient 0%    Medical Problem List and Plan: 1.Right hemiparesis and  aphasia/dysphagiasecondary to left insular and posterior frontal infarct embolic secondary to non-STEMI. Status post loop recorder  Continue CIR PT, OT, SLP, Team conference today please see physician documentation under team conference tab, met with team face-to-face to discuss problems,progress, and goals. Formulized individual treatment plan based on medical history, underlying problem and comorbidities. 2. Antithrombotics: -DVT/anticoagulation:Subcutaneous heparin. -antiplatelet therapy: Aspirin 325 mg daily, Plavix 75 mg daily 3. Pain Management:Tylenol as needed 4. Mood:Provide emotional support -antipsychotic agents: N/A 5. Neuropsych: This patientisnot fully capable of making decisions on hisown behalf. 6. Skin/Wound Care:Routine skin checks 7. Fluids/Electrolytes/Nutrition:Routine in and outs  Hyperkalemia, improved on Veltassa, this has now been discontinued and potassium continues to fall, nephrology has signed off 8.Dysphagia. Dysphagia #1 honey thick liquids. I 662m-improving  Advanced to D2 thins, continue to advance as tolerated   Monitor for aspiration    Off IVF currently 9.Acute on chronic kidney disease stage III/hyperkalemia with obstructive uropathy/BPH. Follow-up urology services as well as nephrology services. -FOLEY TUBE TO REMAIN IN PLACE UNTIL FOLLOW UP OUT PATIENT WITH UROLOGY  Creatinine 5.14 on 5/3 to 4.11 on 5/7 Diuretics being held by nephro  Down to 3.85 on 5/11, BUN elevated , enc fluid 10.Hyperlipidemia. Lipitor 11.Hypertension. Lopressor 25 mg twice daily. Monitor with increased mobility Vitals:   05/02/19 1941 05/03/19 0433  BP: (!) 118/96 113/61  Pulse: 91 87  Resp: 18 18  Temp: 98.1 F (36.7 C) 98.1 F (36.7 C)  SpO2: 99% 99%   Controlled 5/13 12.OSA. Continue CPAP 13.  Acute blood loss anemia  Hemoglobin 9.5 on 4/29, 10.2 on 5/5  Continue to monitor 14.  Constipation alternating  with loose stools, resolved    15 days A FACE TO FACE EVALUATION WAS PERFORMED  ACharlett Blake5/13/2020, 9:12 AM

## 2019-05-03 NOTE — Plan of Care (Signed)
  Problem: Consults Goal: RH STROKE PATIENT EDUCATION Description See Patient Education module for education specifics  Outcome: Progressing   Problem: RH BOWEL ELIMINATION Goal: RH STG MANAGE BOWEL WITH ASSISTANCE Description STG Manage Bowel with Mod Assistance.  Outcome: Progressing   Problem: RH BLADDER ELIMINATION Goal: RH STG MANAGE BLADDER WITH EQUIPMENT WITH ASSISTANCE Description STG Manage Bladder With Equipment With Max Assistance  Outcome: Progressing   Problem: RH SKIN INTEGRITY Goal: RH STG SKIN FREE OF INFECTION/BREAKDOWN Description No new breakdown with min assist   Outcome: Progressing   Problem: RH SAFETY Goal: RH STG ADHERE TO SAFETY PRECAUTIONS W/ASSISTANCE/DEVICE Description STG Adhere to Safety Precautions With Min Assistance/Device.  Outcome: Progressing   Problem: RH COGNITION-NURSING Goal: RH STG USES MEMORY AIDS/STRATEGIES W/ASSIST TO PROBLEM SOLVE Description STG Uses Memory Aids/Strategies With Min Assistance to Problem Solve.  Outcome: Progressing   Problem: RH PAIN MANAGEMENT Goal: RH STG PAIN MANAGED AT OR BELOW PT'S PAIN GOAL Description < 3 out of 10.    Outcome: Progressing   

## 2019-05-03 NOTE — Progress Notes (Signed)
Physical Therapy Weekly Progress Note  Patient Details  Name: Gregory [name] MRN: [id number] Date of Birth: Nov 09, 1951  Beginning of progress report period: Apr 26, 2019 End of progress report period: May 03, 2019  Today's Date: 05/03/2019 PT Individual Time: 1001-1058 PT Individual Time Calculation (min): 57 min   Patient has met 2 of 3 short term goals.  Pt continues to be limited with all functional mobility secondary to increased tone and spasticity in the R upper and lower extremities. During standing and gait pt maintains a very flexed posture and unable to correct despite cues and facilitation.   Patient continues to demonstrate the following deficits muscle weakness, decreased coordination and decreased motor planning, decreased attention and decreased awareness and decreased sitting balance, decreased standing balance, decreased postural control and decreased balance strategies and therefore will continue to benefit from skilled PT intervention to increase functional independence with mobility.  Patient progressing toward long term goals..  Continue plan of care.  PT Short Term Goals Week 2:  PT Short Term Goal 1 (Week 2): Pt will perform sit<>stand with RW and min assist PT Short Term Goal 1 - Progress (Week 2): Met PT Short Term Goal 2 (Week 2): Pt will perform bed<>chair transfer with min assist PT Short Term Goal 2 - Progress (Week 2): Progressing toward goal PT Short Term Goal 3 (Week 2): Pt will ambulated x 10 ft with max assist and LRAD PT Short Term Goal 3 - Progress (Week 2): Met Week 3:  PT Short Term Goal 1 (Week 3): Pt will perform bed<>chair transfer with min assist PT Short Term Goal 2 (Week 3): Pt will ambulate x 20 ft with Max assist and LRAD PT Short Term Goal 3 (Week 3): Pt will maintain standing balance x 3 minutes with LRAD and min assist   Skilled Therapeutic Interventions/Progress Updates:    Pt supine in bed upon PT arrival, agreeable to therapy tx and  denies pain at rest. Pt transferred to sitting EOB with min assist, squat pivot to w/c with mod assist. Pt transported to the gym. Pt performed stand pivot to the mat with RW and mod assist, cues for techniques. Pt worked on sit<>stands x2 with RW and mod assist, cues and manual facilitation for increased hip/trunk extension. Pt maintains flexed posture throughout standing today and unable to correct. Pt points to his stomach and answers yes that his stomach hurts, pt reports no when asked if he needs to use the bathroom. Pt unable to rate pain, RN notified of stomach pain. Pt nods yes when asked if he no longer wants to take stool softener, RN notified. Pt worked on seated balance and trunk control to lean laterally to the R touching R shoulder to target and then returning to midline x 10 with emphasis on maintaining control to limit R lateral LOB. Pt worked on seated balance and anterior weightshifting while performing reaching task outside BOS for cups and then placing them on the floor, cues for maintaining midline when leaning forward to place on floor as pt demonstrates R lateral bias during this, x 2 trials with CGA. Pt transferred supine<>sit on the mat this session with mod assist. In supine therapist performed R UE and R LE PROM and stretching for tone management: elbow extension and shoulder flexion stretching, finger/wrist extension stretching, hamstring and adductor stretching. In supine pt performed R LE D2 extension PNF against manual resistance for neuro re-ed, 2 x 10. Pt transferred to sitting mod assist, squat pivot  to w/c mod assist and transported back to room. Squat pivot to bed, left supine with needs in reach and bed alarm set.   Therapy Documentation Precautions:  Precautions Precautions: Fall Precaution Comments: Expressive aphasia, R hemiparesis Restrictions Weight Bearing Restrictions: No   Therapy/Group: Individual Therapy  Netta Corrigan, PT, DPT 05/03/2019, 8:00 AM

## 2019-05-03 NOTE — Patient Care Conference (Signed)
Inpatient RehabilitationTeam Conference and Plan of Care Update Date: 05/03/2019   Time: 11:25 AM    Patient Name: Gregory [name]      Medical Record Number: [id number]  Date of Birth: Jan 06, 1951 Sex: Male         Room/Bed: 4W24C/4W24C-01 Payor Info: Payor: [employer] MEDICARE / Plan: [employer] MEDICARE HMO / Product Type: *No Product type* /    Admitting Diagnosis: Lt CVA; 0106A; 22-24days  Admit Date/Time:  04/18/2019  4:32 PM Admission Comments: No comment available   Primary Diagnosis:  <principal problem not specified> Principal Problem: <principal problem not specified>  Patient Active Problem List   Diagnosis Date Noted  . Loose stools   . Labile blood pressure   . Aphasia   . Acute blood loss anemia   . AKI (acute kidney injury) (HCC)   . CKD (chronic kidney disease), stage III (HCC)   . Dysphagia, post-stroke   . 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 Hyperkalemia 04/14/2019  . 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 Cerebral infarction due to embolism of left middle cerebral artery (HCC)   . Stroke (cerebrum) (HCC) 04/05/2019  . Demand ischemia (HCC) 04/05/2019  . 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 Pre-diabetes 09/05/2018  . Acute renal failure superimposed on stage 3 chronic kidney disease (HCC) 09/05/2018  . Bilateral carotid artery stenosis 09/05/2018  . Essential hypertension 09/05/2018  . TIA (transient ischemic attack) 09/04/2018    Expected Discharge Date: Expected Discharge Date: 05/17/19  Team Members Present: Physician leading conference: Dr. Claudette LawsAndrew Kirsteins Social Worker Present: Dossie DerBecky Elynor Kallenberger, LCSW Nurse Present: Doran DurandVictoria Totten, LPN PT Present: Woodfin GanjaEmily Van Shagen, PT OT Present: Perrin MalteseJames McGuire, OT SLP Present: Other (comment)(Celia Bueche-SP) PPS Coordinator present : Fae PippinMelissa Bowie     Current Status/Progress Goal Weekly Team Focus  Medical   CKD improving, chronic foley, max A SPT,  alternating diarrhea and diarrhea, pusher's syndrome  improve spasticity mangement  add mild laxative   Bowel/Bladder   chronic foley, Incontinent of bowel lnm 5/9 sorbitol prn ordered.   foley care q shift   assess bowel and bladder needs qshift and PRN   Swallow/Nutrition/ Hydration   dys 2 and thin, Sup A   Mod I  dsy 3 trials and carryover of swallow strategies with oral clearance.   ADL's   Min assist for UB bathing with min to mod for UB dressing.  Mod assist for LB bathing sit to stand at the sink but needs max assist for LB bathing in the shower.  Max assist for stand pivot [facility] or other surfaces.  increased flexor tone in the right elbow.  Brunnstrum stage III in the right arm with stage II in the hand.  min assist overall  selfcare retraining, transfer training, neuromuscular re-education, balance retraining, pt/family education   Mobility   min-mod assist bed mobility, min-mod assist sit<>stand RW, mod assist stand pivot, gait +2 x5430ft  min assist  transfers, gait, standing tolerance, tone management, balance   Communication   Mod-Min A multimodal, Max A speech, Sup A complex auditory comprehension; reading writing at single word level with Mod cues  Sup-Min A multimodal, Mod-Max A speech and Mod I-Sup A complex Auditory comprehension  continue effective use of communication board, expression of wants/needs, complex auditory comprehension, and V/CV production.   Safety/Cognition/ Behavioral Observations            Pain   denies pain  remain free of pain  assess pain qshift and PRN   Skin   no skin impairments  remain free of skin impairments  assess sin qshift and Prn      *See Care Plan and progress notes for long and short-term goals.     Barriers to Discharge  Current Status/Progress Possible Resolutions Date Resolved   Physician    Medical stability;Other (comments);Incontinence  severe aphasia and apraxia  slow progress  may need Botox      Nursing                   PT  Inaccessible home environment;Home environment access/layout  3-4 STE              OT                  SLP     significant communication deficits          SW                Discharge Planning/Teaching Needs:  Ex-wife and their children will be assisting him at home will need education prior to DC home. Making progress in his therapies.       Team Discussion:  Making slow progress but limited due to tone in his arm and leg. MD to inject with botox this week. DC IV fluids encourage fluids. Trials of Dys 3 today. Has a pusher tendency. At times max assist level. Hopefully botox will work and will be able to reach min assist level goals otherwise may be mod assist level. Will need to get family in to see if can manage mod level of care.  Revisions to Treatment Plan:  DC 5/27    Continued Need for Acute Rehabilitation Level of Care: The patient requires daily medical management by a physician with specialized training in physical medicine and rehabilitation for the following conditions: Daily direction of a multidisciplinary physical rehabilitation program to ensure safe treatment while eliciting the highest outcome that is of practical value to the patient.: Yes Daily medical management of patient stability for increased activity during participation in an intensive rehabilitation regime.: Yes Daily analysis of laboratory values and/or radiology reports with any subsequent need for medication adjustment of medical intervention for : Neurological problems   I attest that I was present, lead the team conference, and concur with the assessment and plan of the team. Teleconference held due to COVID19   Lucy Chris 05/03/2019, 12:49 PM

## 2019-05-03 NOTE — Progress Notes (Signed)
Occupational Therapy Weekly Progress Note  Patient Details  Name: Gregory [name] MRN: [id number] Date of Birth: 1951/03/05  Beginning of progress report period: Apr 27, 2019 End of progress report period: May 03, 2019  Today's Date: 05/03/2019 OT Individual Time: 0800-0900 OT Individual Time Calculation (min): 60 min    Patient has met 1 of 4 short term goals.  Mr. Yurkovich continues to make slow progress with OT overall.  He needs min assist for UB bathing with mod assist for UB dressing.  Max assist overall for LB bathing and dressing sit to stand.  He continues to need mod assist for sit to stand, however max assist is needed to maintain standing in order to pull items over his hips or to complete washing peri area.  He needs max assist for stand pivot transfers to the tub bench, 3:1, and wheelchair.  He continues to demonstrate some motor planning impairment as well when attempting to coordinate applying soap to his washcloth or apply deodorant.  Increased tone is noted in the right elbow flexors with functional use.  He is currently Brunnstrum stage III in the arm and hand, but needs max hand over hand assist to use functionally with selfcare tasks.  Expressive aphasia continues to limit his communication as well.  With standing he still demonstrates RLE hemiparesis with max facilitation in the right knee to maintain extension.  He also still exhibits increased trunk flexion in standing with decreased ability to achieve or maintain trunk extension in standing. Increased pushing to the right side is still present in sitting and with transition movements.   Feel he continues to need extensive CIR level rehab to reach min assist level goals.  Will continue with current OT POC.    Patient continues to demonstrate the following deficits: muscle weakness, impaired timing and sequencing, abnormal tone, unbalanced muscle activation, decreased coordination and decreased motor planning, decreased attention  and decreased problem solving and decreased sitting balance, decreased standing balance, decreased postural control, hemiplegia and decreased balance strategies and therefore will continue to benefit from skilled OT intervention to enhance overall performance with BADL.  Patient progressing toward long term goals..  Continue plan of care.  OT Short Term Goals Week 3:  OT Short Term Goal 1 (Week 3): Pt will complete toilet transfer with mod assist. OT Short Term Goal 2 (Week 3): Pt will complete LB dressing with mod assist for pulling pants over hips.   OT Short Term Goal 3 (Week 3): Pt will complete LB bathing with mod assist sit to stand.   OT Short Term Goal 4 (Week 3): Pt will perform walk-in shower transfer with mod assist stand pivot.   Skilled Therapeutic Interventions/Progress Updates:    Pt transferred to the EOB with mod assist on the left side.  He then completed stand pivot to the wheelchair with max assist to the right and then onto the [facility] for toileting.  Max assist for all aspects of toileting sit to stand.  The [place] was used for transfer from the toilet and in order to complete toilet hygiene and transfer back to the wheelchair.  He completed washing front peri area in sitting from the wheelchair and then had him donn new brief and pants with max assist.  Pt finished session in the wheelchair with call button and phone in reach and nursing present.    Therapy Documentation Precautions:  Precautions Precautions: Fall Precaution Comments: Expressive aphasia, R hemiparesis Restrictions Weight Bearing Restrictions: No   Pain:  Pain Assessment Pain Scale: Faces Pain Score: 0-No pain ADL:  See Care Tool Section for some details of ADL   Therapy/Group: Individual Therapy  Elizeo Rodriques OTR/L 05/03/2019, 11:03 AM

## 2019-05-03 NOTE — Progress Notes (Signed)
Speech Language Pathology Daily Session Note  Patient Details  Name: Gregory [name] MRN: [id number] Date of Birth: November 02, 1951  Today's Date: 05/03/2019 SLP Individual Time: 8185-9093 SLP Individual Time Calculation (min): 60 min  Short Term Goals: Week 2: SLP Short Term Goal 1 (Week 2): Pt will tolerate Dys2 diet and thin liquids without overt s/s aspiration given min A verbal and visual cues for use of safe swallow strategies. SLP Short Term Goal 2 (Week 2): Pt will produce 3-5 vowel sounds and or C-V combinations with 50% accuracy given max verbal/visual cues. SLP Short Term Goal 3 (Week 2): Pt will demonstrate comprehension of functional written information with 80% accuracy given min cues  Skilled Therapeutic Interventions: Pt was seen for skilled ST intervention targeting goals for effective communication and advanced diet tolerance. Pt was 85% accurate (22/26) identifying letters of the alphabet. Pt able to approximate names of common objects (pen - pump, spoon - poon, cup - pup, key - ken) with max visual and verbal cues. Pt was 50% accurate producing phoneme /p/ with 7 vowel sounds (pay, pee, pie, poh, pooh, pop, pup). Pt was also observed during lunch of Dysphagia 3 items and thin liquids. Min verbal cues required to check right pocketing. Pt was noted to take appropriate size bites at a reasonable rate. No overt s/s aspiration observed. Pt emphatically agreed with plan to advance solids to soft chopped. Recommend continued full supervision with this advancement, until pt demonstrates no need for full supervision. Pt was left in bed with alarm on, all needs within reach. Continued ST intervention is recommended per current plan of care.   Pain Pain Assessment Pain Scale: 0-10 Pain Score: 0-No pain  Therapy/Group: Individual Therapy   Celia B. Murvin Natal, [facility], CCC-SLP Speech Language Pathologist  Leigh Aurora 05/03/2019, 12:41 PM

## 2019-05-04 ENCOUNTER — Inpatient Hospital Stay (HOSPITAL_COMMUNITY): Payer: Medicare HMO | Admitting: Occupational Therapy

## 2019-05-04 ENCOUNTER — Inpatient Hospital Stay (HOSPITAL_COMMUNITY): Payer: Medicare HMO | Admitting: Physical Therapy

## 2019-05-04 ENCOUNTER — Inpatient Hospital Stay (HOSPITAL_COMMUNITY): Payer: Medicare HMO | Admitting: Speech Pathology

## 2019-05-04 DIAGNOSIS — G8111 Spastic hemiplegia affecting right dominant side: Secondary | ICD-10-CM

## 2019-05-04 NOTE — Progress Notes (Signed)
Social Work Patient ID: Gregory [name], male   DOB: [birth year], 69 y.o.   MRN: [id number] Spoke with Annie-ex-wife and Zadyn-son via telephone to inform of team conference update and progress toward his goals of min assist level.Still working toward discharge of 5/27. Discussed having them come in for education 5/25 and going through therapies to learn his care. Will continue to work on discharge needs.

## 2019-05-04 NOTE — Procedures (Addendum)
Dysport Injection for spasticity  Lot #[id number] Exp 09/20/2019   Dilution: 200 Units/ml Indication: Severe spasticity which interferes with ADL,mobility and/or  hygiene and is unresponsive to medication management and other conservative care Pt gave verbal consent after describing risks and benefits of the procedure with the patient. This includes bleeding, bruising, infection, excessive weakness, or medication side effects. . Needle: 25g 1.5 in Number of units per muscle RIght Biceps 200U RIght adductor longus 300U RIght hamstrings 500U (300U medial and 200U lateral ) All injections were done after obtaining appropriate EMG activity and after negative drawback for blood. The patient tolerated the procedure well. Post procedure instructions were given. A followup appointment was made.

## 2019-05-04 NOTE — Progress Notes (Signed)
[place] PHYSICAL MEDICINE & REHABILITATION PROGRESS NOTE   Subjective/Complaints:  Severe aphasia,discussed need for botox to help with spasticity that is affecting ambulation.  PT having problem with knee flexion and hip add, OT having problem with elbow flexors  ROS: limited due to language/communication    Objective:   No results found. No results for input(s): WBC, HGB, HCT, PLT in the last 72 hours. Recent Labs    05/01/19 1038  NA 134*  K 3.8  CL 101  CO2 19*  GLUCOSE 104*  BUN 81*  CREATININE 3.85*  CALCIUM 9.4    Intake/Output Summary (Last 24 hours) at 05/04/2019 0911 Last data filed at 05/04/2019 0803 Gross per 24 hour  Intake 340 ml  Output 375 ml  Net -35 ml     Physical Exam: Vital Signs Blood pressure 120/68, pulse 84, temperature 98.2 F (36.8 C), resp. rate 19, height 5\' 4"  (1.626 m), weight 80.4 kg, SpO2 99 %. Constitutional: No distress . Vital signs reviewed. HEENT: EOMI, oral membranes moist Neck: supple Cardiovascular: RRR without murmur. No JVD    Respiratory: CTA Bilaterally without wheezes or rales. Normal effort    GI: BS +, non-tender, non-distended . Musc: No edema or tenderness in extremities. Neurologic: Alert Motor: LLE: 5/5 proximal distal RLE: 3-/5 proximal to distal  TOne  MAS 3 Hip Add , Elbow flex, knee flex Expressive aphasia, nods y/n  Assessment/Plan: 1. 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  Physiatrist is providing close team supervision and 24 hour management of active medical problems listed below.  Physiatrist and rehab team continue to assess barriers to discharge/monitor patient progress toward functional and medical goals  Care Tool:  Bathing    Body parts bathed by patient: Right arm, Chest, Abdomen, Front perineal area, Left upper leg, Right upper leg,  Face, Right lower leg, Left lower leg   Body parts bathed by helper: Buttocks, Left arm Body parts n/a: Right lower leg, Left lower leg   Bathing assist Assist Level: Maximal Assistance - Patient 24 - 49%     Upper Body Dressing/Undressing Upper body dressing   What is the patient wearing?: Pull over shirt    Upper body assist Assist Level: Moderate Assistance - Patient 50 - 74%    Lower Body Dressing/Undressing Lower body dressing      What is the patient wearing?: Pants     Lower body assist Assist for lower body dressing: Maximal Assistance - Patient 25 - 49%     Toileting Toileting    Toileting assist Assist for toileting: Maximal Assistance - Patient 25 - 49%     Transfers Chair/bed transfer  Transfers assist     Chair/bed transfer assist level: Moderate Assistance - Patient 50 - 74%     Locomotion Ambulation   Ambulation assist   Ambulation activity did not occur: Safety/medical concerns  Assist level: 2 helpers Assistive device: Other (comment)(3 muskateer) Max distance: 5 ft   Walk 10 feet activity   Assist  Walk 10 feet activity did not occur: Safety/medical concerns  Assist level: 2 helpers Assistive device: Other (comment)(3 muskateer)   Walk 50 feet activity   Assist Walk 50 feet with 2 turns activity did not occur: Safety/medical concerns         Walk 150 feet activity   Assist Walk 150 feet activity did not occur: Safety/medical concerns  Walk 10 feet on uneven surface  activity   Assist Walk 10 feet on uneven surfaces activity did not occur: Safety/medical concerns         Wheelchair     Assist Will patient use wheelchair at discharge?: Yes Type of Wheelchair: Manual    Wheelchair assist level: Dependent - Patient 0% Max wheelchair distance: 150 ft    Wheelchair 50 feet with 2 turns activity    Assist        Assist Level: Dependent - Patient 0%   Wheelchair 150 feet activity      Assist     Assist Level: Dependent - Patient 0%    Medical Problem List and Plan: 1.Right hemiparesis and aphasia/dysphagiasecondary to left insular and posterior frontal infarct embolic secondary to non-STEMI. Status post loop recorder  Continue CIR PT, OT, SLP, 2. Antithrombotics: -DVT/anticoagulation:Subcutaneous heparin. -antiplatelet therapy: Aspirin 325 mg daily, Plavix 75 mg daily 3. Pain Management:Tylenol as needed 4. Mood:Provide emotional support -antipsychotic agents: N/A 5. Neuropsych: This patientisnot fully capable of making decisions on hisown behalf. 6. Skin/Wound Care:Routine skin checks 7. Fluids/Electrolytes/Nutrition:Routine in and outs  Hyperkalemia, improved on V[place], this has now been discontinued and potassium continues to fall, nephrology has signed off 8.Dysphagia. Dysphagia #1 honey thick liquids. I 67024ml-improving  Advanced to D2 thins, continue to advance as tolerated   Monitor for aspiration    Off IVF currently 9.Acute on chronic kidney disease stage III/hyperkalemia with obstructive uropathy/BPH. Follow-up urology services as well as nephrology services. -FOLEY TUBE TO REMAIN IN PLACE UNTIL FOLLOW UP OUT PATIENT WITH UROLOGY  Creatinine 5.14 on 5/3 to 4.11 on 5/7 Diuretics being held by nephro  Down to 3.85 on 5/11, BUN elevated , enc fluid 10.Hyperlipidemia. Lipitor 11.Hypertension. Lopressor 25 mg twice daily. Monitor with increased mobility Vitals:   05/03/19 1933 05/04/19 0507  BP: (!) 120/53 120/68  Pulse: 94 84  Resp: 19 19  Temp: 97.7 F (36.5 C) 98.2 F (36.8 C)  SpO2: 100% 99%   Controlled 5/14 12.OSA. Continue CPAP 13.  Acute blood loss anemia  Hemoglobin 9.5 on 4/29, 10.2 on 5/5  Continue to monitor 14.  Constipation alternating with loose stools, resolved    16 days A FACE TO FACE EVALUATION WAS PERFORMED  Erick Colacendrew E Coltyn Hanning 05/04/2019, 9:11 AM

## 2019-05-04 NOTE — Progress Notes (Signed)
Physical Therapy Session Note  Patient Details  Name: Gregory [name] MRN: [id number] Date of Birth: [birth year]  Today's Date: 05/04/2019 PT Individual Time: 1406-1506 PT Individual Time Calculation (min): 60 min   Short Term Goals: Week 3:  PT Short Term Goal 1 (Week 3): Pt will perform bed<>chair transfer with min assist PT Short Term Goal 2 (Week 3): Pt will ambulate x 20 ft with Max assist and LRAD PT Short Term Goal 3 (Week 3): Pt will maintain standing balance x 3 minutes with LRAD and min assist   Skilled Therapeutic Interventions/Progress Updates:    Pt received supine in bed and eager to participate in therapy session. Performed supine to sit, HOB partially elevated and using bedrails, with close supervision. Attempted stand pivot transfer EOB>w/c using RW but due to R lateral lean/LOB not safe to perform without +2 assistance and transitioned to squat pivot with mod assist for lifting/pivoting hips. With many yes/no questions pt reporting both stomach and rectum pain with frequent diarhhea and requesting no more medication for BMs - nursing staff notified. Transported to/from gym in w/c. Performed squat pivot transfer w/c>EOM with min/mod assist for pivoting hips. Performed sit>supine with close supervision and min assist for trunk alignment in supine. Therapist performed R LE PROM hip/knee flexion/extension, sustained hamstring and hip adductor stretch 2x88minute each as well as alternate hip/knee flexion/extension with ankle DF/PF all for tone management. Supine>sit with min assist for trunk upright and cuing for sequencing. Sit<>stand EOM<>RW with min assist for lifting and manual facilitation for R LE placement/alignment throughout session. Performed pre-gait training focusing on R LE NMR for stance phase - 3 bouts of repeated L LE step up/down on 2" step with lateral weightshifting to/from R LE - manual facilitation for weightshifting and sustaining R LE knee extension during stance  phase - repeated cuing throughout for improved hip/trunk extension for upright stance. Supine trunk supported on burgundy wedge hip flexor stretch for 2 minutes. Initiated pre-gait training with R LE stepping forward/backwards to external target with pt able to initiate small step forward with manual facilitation; however, due to fatigue required mod assist for balance. Squat pivot transfer EOM>w/c with mod assist for lifting/pivoting hips. Transported back to room and performed squat pivot transfer w/c>EOB with mod assist. Sit to supine with min assist for B LE management and trunk alignment once in supine. Pt left supine in bed with needs in reach, lines intact, and bed alarm on.  Therapy Documentation Precautions:  Precautions Precautions: Fall Precaution Comments: Expressive aphasia, R hemiparesis Restrictions Weight Bearing Restrictions: No  Pain:   No complaints of pain during session except for reporting prior pain during BMs as described above.   Therapy/Group: Individual Therapy  Ginny Forth, PT, DPT 05/04/2019, 7:59 AM

## 2019-05-04 NOTE — Plan of Care (Signed)
  Problem: Consults Goal: RH STROKE PATIENT EDUCATION Description See Patient Education module for education specifics  Outcome: Progressing   Problem: RH BOWEL ELIMINATION Goal: RH STG MANAGE BOWEL WITH ASSISTANCE Description STG Manage Bowel with Mod Assistance.  Outcome: Progressing   Problem: RH BLADDER ELIMINATION Goal: RH STG MANAGE BLADDER WITH EQUIPMENT WITH ASSISTANCE Description STG Manage Bladder With Equipment With Max Assistance  Outcome: Progressing   Problem: RH SKIN INTEGRITY Goal: RH STG SKIN FREE OF INFECTION/BREAKDOWN Description No new breakdown with min assist   Outcome: Progressing   Problem: RH SAFETY Goal: RH STG ADHERE TO SAFETY PRECAUTIONS W/ASSISTANCE/DEVICE Description STG Adhere to Safety Precautions With Min Assistance/Device.  Outcome: Progressing   Problem: RH COGNITION-NURSING Goal: RH STG USES MEMORY AIDS/STRATEGIES W/ASSIST TO PROBLEM SOLVE Description STG Uses Memory Aids/Strategies With Min Assistance to Problem Solve.  Outcome: Progressing   Problem: RH PAIN MANAGEMENT Goal: RH STG PAIN MANAGED AT OR BELOW PT'S PAIN GOAL Description < 3 out of 10.    Outcome: Progressing   

## 2019-05-04 NOTE — Progress Notes (Signed)
Speech Language Pathology Weekly Progress and Session Note  Patient Details  Name: Gregory [name] MRN: [id number] Date of Birth: [birth year]  Beginning of progress report period: Apr 27, 2019 End of progress report period: May 04, 2019  Today's Date: 05/04/2019 SLP Individual Time: 1100-1200 SLP Individual Time Calculation (min): 60 min  Short Term Goals: Week 2: SLP Short Term Goal 1 (Week 2): Pt will tolerate Dys2 diet and thin liquids without overt s/s aspiration given min A verbal and visual cues for use of safe swallow strategies. SLP Short Term Goal 1 - Progress (Week 2): Met SLP Short Term Goal 2 (Week 2): Pt will produce 3-5 vowel sounds and or C-V combinations with 50% accuracy given max verbal/visual cues. SLP Short Term Goal 2 - Progress (Week 2): Met SLP Short Term Goal 3 (Week 2): Pt will demonstrate comprehension of functional written information with 80% accuracy given min cues SLP Short Term Goal 3 - Progress (Week 2): Not met    New Short Term Goals: Week 3: SLP Short Term Goal 1 (Week 3): Pt will tolerate Dys3 diet and thin liquids without overt s/s aspiration given min A verbal and visual cues for use of safe swallow strategies. SLP Short Term Goal 2 (Week 3): Pt will produce 3-5 vowel sounds and or C-V combinations with 75% accuracy given mod verbal/visual cues. SLP Short Term Goal 3 (Week 3): Pt will demonstrate comprehension of functional written information with 80% accuracy given min cues SLP Short Term Goal 4 (Week 3): Pt will demonstrate ability to spell functional words (via pen and paper or communication board) with 80% accuracy given mod cues  Weekly Progress Updates: Pt continues to make progress in therapy, and has met 2/3 goals this week. Pt's diet has been advanced to dys 3 with thin liquids. He is showing continued gradual improvement in production of CV syllables, but still has difficulty switching to a different phoneme. Continued trials of reading  comprehension are recommended.  Intensity: Minumum of 1-2 x/day, 30 to 90 minutes Frequency: 3 to 5 out of 7 days Duration/Length of Stay: 05/17/2019 Treatment/Interventions: Environmental controls;Speech/Language facilitation;Cueing hierarchy;Functional tasks;Therapeutic Activities;Oral motor exercises;Dysphagia/aspiration precaution training;Patient/family education   Daily Session Skilled Therapeutic Interventions: Pt was seen for skilled ST intervention targeting goals for improved effective speech. SLP facilitated session by providing alphabet board for pt to spell 2 letter words. Pt exhibited difficulty with this task, and required mod visual cues for accurate identification of correct letters. Pt demonstrated significant improvement today in his production of phoneme /p/ in CV syllables - 90% accurate over several trials throughout the session. Pt was unable to switch to a different phoneme during this session (/m/ and /t/ were attempted).  Pt was left in bed chair with alarm on, all needs within reach. Continue ST per current plan of care.   General    Pain None indicated  Therapy/Group: Individual Therapy   Gregory [name], [facility], CCC-SLP Speech Language Pathologist  Shonna Chock 05/04/2019, 4:01 PM

## 2019-05-04 NOTE — Progress Notes (Signed)
Occupational Therapy Session Note  Patient Details  Name: Gregory [name] MRN: [id number] Date of Birth: 1951/07/16  Today's Date: 05/04/2019 OT Individual Time: 1003-1102 OT Individual Time Calculation (min): 59 min    Short Term Goals: Week 3:  OT Short Term Goal 1 (Week 3): Pt will complete toilet transfer with mod assist. OT Short Term Goal 2 (Week 3): Pt will complete LB dressing with mod assist for pulling pants over hips.   OT Short Term Goal 3 (Week 3): Pt will complete LB bathing with mod assist sit to stand.   OT Short Term Goal 4 (Week 3): Pt will perform walk-in shower transfer with mod assist stand pivot.   Skilled Therapeutic Interventions/Progress Updates:    Pt transferred to the wheelchair from the bed with max assist squat pivot.  He then worked on grooming, bathing, and dressing sit to stand at the sink.  Mod assist for thoroughness with shaving on the right side of his face secondary to motor planning difficulty and manipulating the razor correctly with the left hand.  Min assist for UB bathing with mod assist for LB bathing sit to stand.  Max hand over hand needed for integration of the RUE for washing and drying the left arm.  In standing he needs max facilitation for right knee and hip extension, with limited trunk extension noted in standing.  All LB dressing was completed at max assist sit to stand.  Finished session with pt in the wheelchair with call button and phone in reach and safety alarm belt in place.  Pt still exhibits expressive difficulties as well as moderate tone in the right elbow flexors.   Therapy Documentation Precautions:  Precautions Precautions: Fall Precaution Comments: Expressive aphasia, R hemiparesis Restrictions Weight Bearing Restrictions: No   Pain: Pain Assessment Pain Scale: Faces Pain Score: 0-No pain ADL: See Care Tool Section for some details of ADL  Therapy/Group: Individual Therapy  Muaad Boehning OTR/L 05/04/2019, 1:10  PM

## 2019-05-05 ENCOUNTER — Inpatient Hospital Stay (HOSPITAL_COMMUNITY): Payer: Medicare HMO | Admitting: Occupational Therapy

## 2019-05-05 ENCOUNTER — Inpatient Hospital Stay (HOSPITAL_COMMUNITY): Payer: Medicare HMO | Admitting: Physical Therapy

## 2019-05-05 ENCOUNTER — Inpatient Hospital Stay (HOSPITAL_COMMUNITY): Payer: Medicare HMO

## 2019-05-05 NOTE — Progress Notes (Signed)
Speech Language Pathology Daily Session Note  Patient Details  Name: Gregory [name] MRN: [id number] Date of Birth: December 24, 1950  Today's Date: 05/05/2019 SLP Individual Time: 2297-9892 SLP Individual Time Calculation (min): 59 min  Short Term Goals: Week 3: SLP Short Term Goal 1 (Week 3): Pt will tolerate Dys3 diet and thin liquids without overt s/s aspiration given min A verbal and visual cues for use of safe swallow strategies. SLP Short Term Goal 2 (Week 3): Pt will produce 3-5 vowel sounds and or C-V combinations with 75% accuracy given mod verbal/visual cues. SLP Short Term Goal 3 (Week 3): Pt will demonstrate comprehension of functional written information with 80% accuracy given min cues SLP Short Term Goal 4 (Week 3): Pt will demonstrate ability to spell functional words (via pen and paper or communication board) with 80% accuracy given mod cues  Skilled Therapeutic Interventions:Skilled ST services focused on language skills. SLP facilitated speech production of CVC /p/ word given pictures, pt demonstrated ability to produce word in 9 out 11 opportunities. SLP facilitated production of CV /b/ words, pt demonstrated ability to produce 17 out 21 opportunities, not stimulable for /bo/. Pt demonstrated ability to spell two letter words on alphabet board in 8 out 10 letter opportunities with mod A for underline spacing, written feedback of selected letters and phonemic cues. Pt demonstrated ability to spell CVC 3 letter words on alphabet board in 15 out 15 letter opportunties, with 30% cues of underline spacing, written selected letter for feedback and phonemic cues. SLP and nurse assisted pt into bed, per pt request, pt demonstrated ability to follow 1 step commands. Pt was left with call bell within reach and bed alarm set. SLP recommends to continue skilled ST services.     Pain Pain Assessment Pain Score: 0-No pain  Therapy/Group: Individual Therapy  Rick Warnick  [facility] 05/05/2019, 12:35  PM

## 2019-05-05 NOTE — Plan of Care (Signed)
  Problem: Consults Goal: RH STROKE PATIENT EDUCATION Description See Patient Education module for education specifics  05/05/2019 1118 by Marylu Lund, LPN Outcome: Progressing 05/05/2019 1118 by Marylu Lund, LPN Outcome: Progressing   Problem: RH BOWEL ELIMINATION Goal: RH STG MANAGE BOWEL WITH ASSISTANCE Description STG Manage Bowel with Mod Assistance.  05/05/2019 1118 by Marylu Lund, LPN Outcome: Progressing 05/05/2019 1118 by Marylu Lund, LPN Outcome: Progressing   Problem: RH BLADDER ELIMINATION Goal: RH STG MANAGE BLADDER WITH EQUIPMENT WITH ASSISTANCE Description STG Manage Bladder With Equipment With Max Assistance  05/05/2019 1118 by Marylu Lund, LPN Outcome: Progressing 05/05/2019 1118 by Marylu Lund, LPN Outcome: Progressing   Problem: RH SKIN INTEGRITY Goal: RH STG SKIN FREE OF INFECTION/BREAKDOWN Description No new breakdown with min assist   05/05/2019 1118 by Marylu Lund, LPN Outcome: Progressing 05/05/2019 1118 by Marylu Lund, LPN Outcome: Progressing   Problem: RH SAFETY Goal: RH STG ADHERE TO SAFETY PRECAUTIONS W/ASSISTANCE/DEVICE Description STG Adhere to Safety Precautions With Min Assistance/Device.  05/05/2019 1118 by Marylu Lund, LPN Outcome: Progressing 05/05/2019 1118 by Marylu Lund, LPN Outcome: Progressing   Problem: RH COGNITION-NURSING Goal: RH STG USES MEMORY AIDS/STRATEGIES W/ASSIST TO PROBLEM SOLVE Description STG Uses Memory Aids/Strategies With Min Assistance to Problem Solve.  05/05/2019 1118 by Marylu Lund, LPN Outcome: Progressing 05/05/2019 1118 by Marylu Lund, LPN Outcome: Progressing   Problem: RH PAIN MANAGEMENT Goal: RH STG PAIN MANAGED AT OR BELOW PT'S PAIN GOAL Description < 3 out of 10.    05/05/2019 1118 by Marylu Lund, LPN Outcome: Progressing 05/05/2019 1118 by Marylu Lund, LPN Outcome: Progressing

## 2019-05-05 NOTE — Progress Notes (Signed)
Occupational Therapy Session Note  Patient Details  Name: Gregory [name] MRN: [id number] Date of Birth: 1951-07-08  Today's Date: 05/05/2019 OT Individual Time: 0998-3382 OT Individual Time Calculation (min): 69 min    Short Term Goals: Week 3:  OT Short Term Goal 1 (Week 3): Pt will complete toilet transfer with mod assist. OT Short Term Goal 2 (Week 3): Pt will complete LB dressing with mod assist for pulling pants over hips.   OT Short Term Goal 3 (Week 3): Pt will complete LB bathing with mod assist sit to stand.   OT Short Term Goal 4 (Week 3): Pt will perform walk-in shower transfer with mod assist stand pivot.   Skilled Therapeutic Interventions/Progress Updates:    Pt completed shower and dressing during session.  Mod assist for transfer from supine to the EOB.  Max assist for stand pivot transfer to the right side to the wheelchair.  He needed the same assistance for transfer stand pivot into the shower.  Min assist for UB bathing with mod assist for LB bathing sit to stand and integration of a LH sponge for washing the left lower legs.  Reacher was utilized with mod demonstrational cueing for drying them.  He transferred stand pivot with max assist back to the wheelchair at conclusion of bathing and worked on dressing at the sink.  Mod assist was needed with mod demonstrational cueing for following hemi dressing technique to donn a pullover shirt.  He then donned the brief and pants with mod assist after therapist threaded the catheter through them.  He needed total assist for donning gripper socks over his feet.  He completed mod stand pivot transfer to the left using the bed rail on the left side for support.  Sit to supine with min assist to finish session.  Pt left with call button and phone in reach and safety alarm in place.     Therapy Documentation Precautions:  Precautions Precautions: Fall Precaution Comments: Expressive aphasia, R hemiparesis Restrictions Weight Bearing  Restrictions: No   Pain: Pain Assessment Pain Scale: Faces Pain Score: 0-No pain ADL:  See Care Tool Section for some details of ADL   Therapy/Group: Individual Therapy  Tangia Pinard OTR/L 05/05/2019, 3:57 PM

## 2019-05-05 NOTE — Progress Notes (Signed)
Physical Therapy Session Note  Patient Details  Name: Gregory [name] MRN: [id number] Date of Birth: July 11, 1951  Today's Date: 05/05/2019 PT Individual Time: 4268-3419 PT Individual Time Calculation (min): 45 min   Short Term Goals: Week 3:  PT Short Term Goal 1 (Week 3): Pt will perform bed<>chair transfer with min assist PT Short Term Goal 2 (Week 3): Pt will ambulate x 20 ft with Max assist and LRAD PT Short Term Goal 3 (Week 3): Pt will maintain standing balance x 3 minutes with LRAD and min assist   Skilled Therapeutic Interventions/Progress Updates:   Pt received supine in bed and agreeable to therapy session despite reporting he overall doesn't feel well. Pt received botox injections in R LE hip adductors and hamstring as well as R UE biceps yesterday and pt reported being sore from the shots today. Therapist donned pants, TED hose, and socks bilaterally with max assist for time management - pt rolled R/L with mod assist for complete rolling and R hemibody assist. Therapist performed supine R LE hip/knee flexion/extension PROM, ankle PF/DF, hamstring stretch 3x73minute, hip adductor stretch 3x61minute as well as R UE elbow flexion/extension and sustained bicep stretch 3x45minute for tone management. Pt performed supine to sit, HOB partially elevated and using bedrails, with min assist for trunk upright. Performed sit<>stand EOB<>RW with mod assist for lifting hips and balance upon coming to standing - manual facilitation for increased R LE hip/knee extension and placement on floor - pt continues to demonstrate sustained hip flexion during stance with inability to achieve full upright posture. Performed 3 bouts to fatigue of pre-gait LE NMR focusing on R LE stance phase while stepping forward/backwards with L LE - manual facilitation for weight shifting and R LE hip/knee extension - mod assist throughout for balance. Attempted sitting reclined hip flexor stretch but pt reported low back pain.  Sit>supine with min assist for B LE management. Performed supine R LE hip flexor stretch on EOB 3x14minute and in sidelying therapist performed R hip flexor stretch while pt engaged R gluteal muscle activation x 10 repetitions. Supine>sit with min assist for trunk upright. Performed R squat pivot transfer EOB>w/c with mod assist for lifting/pivoting hips. Pt left sitting in w/c with needs in reach, lines intact, and seat belt alarm on.   Therapy Documentation Precautions:  Precautions Precautions: Fall Precaution Comments: Expressive aphasia, R hemiparesis Restrictions Weight Bearing Restrictions: No  Pain:   Reports soreness at location of botox shots.   Therapy/Group: Individual Therapy  Ginny Forth, PT, DPT 05/05/2019, 7:53 AM

## 2019-05-05 NOTE — Progress Notes (Signed)
[place] PHYSICAL MEDICINE & REHABILITATION PROGRESS NOTE   Subjective/Complaints:  Severe aphasia, Pt accurate with Y/N nods, no pain in injection areas ROS: nods no to CP, SOB, Bowel or bladder issues    Objective:   No results found. No results for input(s): WBC, HGB, HCT, PLT in the last 72 hours. No results for input(s): NA, K, CL, CO2, GLUCOSE, BUN, CREATININE, CALCIUM in the last 72 hours.  Intake/Output Summary (Last 24 hours) at 05/05/2019 0940 Last data filed at 05/05/2019 0813 Gross per 24 hour  Intake 780 ml  Output 650 ml  Net 130 ml     Physical Exam: Vital Signs Blood pressure 125/68, pulse 89, temperature 97.7 F (36.5 C), temperature source Oral, resp. rate 17, height 5\' 4"  (1.626 m), weight 80.4 kg, SpO2 98 %. Constitutional: No distress . Vital signs reviewed. HEENT: EOMI, oral membranes moist Neck: supple Cardiovascular: RRR without murmur. No JVD    Respiratory: CTA Bilaterally without wheezes or rales. Normal effort    GI: BS +, non-tender, non-distended . Musc: No edema or tenderness in extremities. Neurologic: Alert Motor: LLE: 5/5 proximal distal RLE: 3-/5 proximal to distal  TOne  MAS 3 Hip Add , Elbow flex, knee flex Expressive aphasia, nods y/n Skin no bruising over the R biceps , hip add, or hamstring area Assessment/Plan: 1. 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  Physiatrist is providing close team supervision and 24 hour management of active medical problems listed below.  Physiatrist and rehab team continue to assess barriers to discharge/monitor patient progress toward functional and medical goals  Care Tool:  Bathing    Body parts bathed by patient: Right arm, Chest, Abdomen, Front perineal area, Left upper leg, Right upper leg, Face, Right lower leg, Left lower leg   Body parts  bathed by helper: Buttocks, Left arm Body parts n/a: Right lower leg, Left lower leg   Bathing assist Assist Level: Moderate Assistance - Patient 50 - 74%     Upper Body Dressing/Undressing Upper body dressing   What is the patient wearing?: Pull over shirt    Upper body assist Assist Level: Moderate Assistance - Patient 50 - 74%    Lower Body Dressing/Undressing Lower body dressing      What is the patient wearing?: Pants     Lower body assist Assist for lower body dressing: Maximal Assistance - Patient 25 - 49%     Toileting Toileting    Toileting assist Assist for toileting: Maximal Assistance - Patient 25 - 49%     Transfers Chair/bed transfer  Transfers assist     Chair/bed transfer assist level: Moderate Assistance - Patient 50 - 74%(squat pivot)     Locomotion Ambulation   Ambulation assist   Ambulation activity did not occur: Safety/medical concerns  Assist level: 2 helpers Assistive device: Other (comment)(3 muskateer) Max distance: 5 ft   Walk 10 feet activity   Assist  Walk 10 feet activity did not occur: Safety/medical concerns  Assist level: 2 helpers Assistive device: Other (comment)(3 muskateer)   Walk 50 feet activity   Assist Walk 50 feet with 2 turns activity did not occur: Safety/medical concerns         Walk 150 feet activity   Assist Walk 150 feet activity did not occur: Safety/medical concerns         Walk 10 feet on uneven surface  activity   Assist Walk 10 feet on uneven surfaces activity did not occur: Safety/medical concerns         Wheelchair     Assist Will patient use wheelchair at discharge?: Yes Type of Wheelchair: Manual    Wheelchair assist level: Dependent - Patient 0% Max wheelchair distance: 150 ft    Wheelchair 50 feet with 2 turns activity    Assist        Assist Level: Dependent - Patient 0%   Wheelchair 150 feet activity     Assist     Assist Level: Dependent  - Patient 0%    Medical Problem List and Plan: 1.Right hemiparesis and aphasia/dysphagiasecondary to left insular and posterior frontal infarct embolic secondary to non-STEMI. Status post loop recorder  Continue CIR PT, OT, SLP, 2. Antithrombotics: -DVT/anticoagulation:Subcutaneous heparin. -antiplatelet therapy: Aspirin 325 mg daily, Plavix 75 mg daily 3. Pain Management:Tylenol as needed 4. Mood:Provide emotional support -antipsychotic agents: N/A 5. Neuropsych: This patientisnot fully capable of making decisions on hisown behalf. 6. Skin/Wound Care:Routine skin checks 7. Fluids/Electrolytes/Nutrition:Routine in and outs  Hyperkalemia, improved on V[place], this has now been discontinued and potassium continues to fall, nephrology has signed off 8.Dysphagia. Dysphagia #1 honey thick liquids. I 6624ml-improving  Advanced to D2 thins, continue to advance as tolerated   Monitor for aspiration    Off IVF currently 9.Acute on chronic kidney disease stage III/hyperkalemia with obstructive uropathy/BPH. Follow-up urology services as well as nephrology services. -FOLEY TUBE TO REMAIN IN PLACE UNTIL FOLLOW UP OUT PATIENT WITH UROLOGY  Creatinine 5.14 on 5/3 to 4.11 on 5/7 Diuretics being held by nephro  Down to 3.85 on 5/11, BUN elevated , enc fluid 10.Hyperlipidemia. Lipitor 11.Hypertension. Lopressor 25 mg twice daily. Monitor with increased mobility Vitals:   05/05/19 0434 05/05/19 0826  BP: (!) 116/57 125/68  Pulse: 97 89  Resp: 17   Temp: 97.7 F (36.5 C)   SpO2: 99% 98%   Controlled 5/15 12.OSA. Continue CPAP 13.  Acute blood loss anemia  Hemoglobin 9.5 on 4/29, 10.2 on 5/5  Continue to monitor 14.  Constipation alternating with loose stools, resolved 15.  Severe spasticity , s/p dysport injection 5/14 to R biceps, Hamstrings, and add longus   17 days A FACE TO FACE EVALUATION WAS PERFORMED  Erick Colacendrew E   05/05/2019, 9:40 AM

## 2019-05-06 LAB — GLUCOSE, CAPILLARY
Glucose-Capillary: 95 mg/dL (ref 70–99)
Glucose-Capillary: 86 mg/dL (ref 70–99)

## 2019-05-06 LAB — RENAL FUNCTION PANEL
Albumin: 3 g/dL — ABNORMAL LOW (ref 3.5–5.0)
Anion gap: 16 — ABNORMAL HIGH (ref 5–15)
BUN: 99 mg/dL — ABNORMAL HIGH (ref 8–23)
CO2: 16 mmol/L — ABNORMAL LOW (ref 22–32)
Calcium: 9.3 mg/dL (ref 8.9–10.3)
Chloride: 102 mmol/L (ref 98–111)
Creatinine, Ser: 4.13 mg/dL — ABNORMAL HIGH (ref 0.61–1.24)
GFR calc Af Amer: 16 mL/min — ABNORMAL LOW (ref >60)
GFR calc non Af Amer: 14 mL/min — ABNORMAL LOW (ref >60)
Glucose, Bld: 92 mg/dL (ref 70–99)
Phosphorus: 4.5 mg/dL (ref 2.5–4.6)
Potassium: 4.3 mmol/L (ref 3.5–5.1)
Sodium: 134 mmol/L — ABNORMAL LOW (ref 135–145)

## 2019-05-06 NOTE — Progress Notes (Signed)
Gregory [name] is a 68 y.o. male who is admitted for CIR with a aphasia and right hemiparesis secondary to embolic left insular and posterior frontal infarcts in the setting of non-STEMI  Past Medical History:  Diagnosis Date  . AKI (acute kidney injury) (HCC)   . CKD (chronic kidney disease) stage 2, GFR 60-89 ml/min   . CVA (cerebral vascular accident) (HCC)   . Hyperlipidemia   . Hypertension   . PVD (peripheral vascular disease) (HCC)      Subjective: No new complaints. No new problems. Slept well. Feeling OK.  Objective: Vital signs in last 24 hours: Temp:  [97.5 F (36.4 C)-97.8 F (36.6 C)] 97.5 F (36.4 C) (05/16 0445) Pulse Rate:  [72-86] 72 (05/16 0445) Resp:  [18] 18 (05/16 0445) BP: (117-132)/(49-60) 117/60 (05/16 0445) SpO2:  [94 %-100 %] 100 % (05/16 0445) Weight change:  Last BM Date: 05/03/19  Intake/Output from previous day: 05/15 0701 - 05/16 0700 In: 782 [P.O.:782] Out: 800 [Urine:800]  Patient Vitals for the past 24 hrs:  BP Temp Temp src Pulse Resp SpO2  05/06/19 0445 117/60 (!) 97.5 F (36.4 C) Oral 72 18 100 %  05/05/19 1925 (!) 132/49 97.8 F (36.6 C) Oral 86 18 94 %     Physical Exam General: No apparent distress   HEENT: not dry Lungs: Normal effort. Lungs clear to auscultation, no crackles or wheezes. Cardiovascular: Regular rate and rhythm, no edema Abdomen: S/NT/ND; BS(+) Musculoskeletal:  unchanged Neurological: No new neurological deficits with a aphasia and right hemiparesis Skin: clear  Mental state: Alert, oriented, cooperative    Lab Results: BMET    Component Value Date/Time   NA 134 (L) 05/06/2019 0710   K 4.3 05/06/2019 0710   CL 102 05/06/2019 0710   CO2 16 (L) 05/06/2019 0710   GLUCOSE 92 05/06/2019 0710   BUN 99 (H) 05/06/2019 0710   CREATININE 4.13 (H) 05/06/2019 0710   CALCIUM 9.3 05/06/2019 0710   GFRNONAA 14 (L) 05/06/2019 0710   GFRAA 16 (L) 05/06/2019 0710   CBC    Component Value Date/Time   WBC 10.3 04/25/2019 0537   RBC 3.63 (L) 04/25/2019 0537   HGB 10.2 (L) 04/25/2019 0537   HCT 33.6 (L) 04/25/2019 0537   PLT 290 04/25/2019 0537   MCV 92.6 04/25/2019 0537   MCH 28.1 04/25/2019 0537   MCHC 30.4 04/25/2019 0537   RDW 13.3 04/25/2019 0537   LYMPHSABS 1.9 04/19/2019 0532   MONOABS 1.0 04/19/2019 0532   EOSABS 0.2 04/19/2019 0532   BASOSABS 0.0 04/19/2019 0532     Medications: I have reviewed the patient's current medications.  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  Continue CIR DVT prophylaxis continue subcutaneous heparin Hypertension.  Blood pressure well controlled Chronic kidney disease.  Creatinine 4.13.  Follow-up nephrology.  Diuretics on hold  Length of stay, days: 18  Gordy Savers , MD 05/06/2019, 11:09 AM

## 2019-05-07 ENCOUNTER — Inpatient Hospital Stay (HOSPITAL_COMMUNITY): Payer: Medicare HMO

## 2019-05-07 NOTE — Progress Notes (Signed)
Physical Therapy Session Note  Patient Details  Name: Gregory [name] MRN: [id number] Date of Birth: May 30, 1951  Today's Date: 05/07/2019 PT Individual Time: 1006-1100 PT Individual Time Calculation (min): 54 min   Short Term Goals: Week 3:  PT Short Term Goal 1 (Week 3): Pt will perform bed<>chair transfer with min assist PT Short Term Goal 2 (Week 3): Pt will ambulate x 20 ft with Max assist and LRAD PT Short Term Goal 3 (Week 3): Pt will maintain standing balance x 3 minutes with LRAD and min assist   Skilled Therapeutic Interventions/Progress Updates:    Pt supine in bed upon PT arrival, agreeable to therapy tx and denies pain. Upon pulling back the sheets therapist noted increased bleeding from pts abdomen from heparin shot given this morning. RN notified and applied pressure to stop the bleeding. Pts clothing soiled with blood. Pt transferred to sitting EOB and performed sit<>stands x4 within stedy with max assist this session. Pt maintained standing balance with mod assist while second helper assisted with clothing management to don clean briefs and pants. Pt with c/o dizziness and leaning over in the stedy trying to rest his head on his arms. Pt transferred to w/c with stedy. BP checked in sitting- 111/60 with pt c/o dizziness. Pt requesting to lay back down this session. Pt transferred to bed with stedy. Pt transferred sit>supine max assist. In supine therapist performed R LE stretching to hip flexors, adductors and hamstrings this session 2 x 1 min each. Therapist also performed R UE PROM to the elbow and shoulder this session. Pt left supine in bed at end of session with needs in reach and chair alarm set.   Therapy Documentation Precautions:  Precautions Precautions: Fall Precaution Comments: Expressive aphasia, R hemiparesis Restrictions Weight Bearing Restrictions: No    Therapy/Group: Individual Therapy  Cresenciano Genre, PT, DPT 05/07/2019, 7:51 AM

## 2019-05-07 NOTE — Progress Notes (Signed)
Gregory [name] is a 68 y.o. 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 AKI (acute kidney injury) (HCC)   . CKD (chronic kidney disease) stage 2, GFR 60-89 ml/min   . CVA (cerebral vascular accident) (HCC)   . Hyperlipidemia   . Hypertension   . PVD (peripheral vascular disease) (HCC)      Subjective: No new complaints. No new problems.  Aphasic  Objective: Vital signs in last 24 hours: Temp:  [97.6 F (36.4 C)-98.5 F (36.9 C)] 98.3 F (36.8 C) (05/17 0546) Pulse Rate:  [73-89] 75 (05/17 1036) Resp:  [18] 18 (05/17 0546) BP: (107-133)/(60-95) 111/60 (05/17 1036) SpO2:  [98 %-99 %] 99 % (05/17 0546) Weight change:  Last BM Date: 05/04/19  Intake/Output from previous day: 05/16 0701 - 05/17 0700 In: 720 [P.O.:720] Out: 700 [Urine:700] Last cbgs: CBG (last 3)  Recent Labs    05/06/19 1159 05/06/19 1644  GLUCAP 95 86   Patient Vitals for the past 24 hrs:  BP Temp Temp src Pulse Resp SpO2  05/07/19 1036 111/60 - - 75 - -  05/07/19 0546 (!) 121/95 98.3 F (36.8 C) Oral 89 18 99 %  05/06/19 2001 133/60 97.6 F (36.4 C) Oral 89 18 99 %  05/06/19 1328 107/87 98.5 F (36.9 C) Oral 73 - 98 %     Physical Exam General: No apparent distress   HEENT: not dry Lungs: Normal effort. Lungs clear to auscultation, no crackles or wheezes. Cardiovascular: Irregular rate and rhythm, no edema; grade 2/6 systolic murmur Abdomen: S/NT/ND; BS(+) Musculoskeletal:  unchanged Neurological: No new neurological deficits with  aphasia and right hemiparesis Extremities.  No edema Skin: clear   Mental state: Alert, oriented, cooperative    Lab Results: BMET    Component Value Date/Time   NA 134 (L) 05/06/2019 0710   K 4.3 05/06/2019 0710   CL 102 05/06/2019 0710   CO2 16 (L) 05/06/2019 0710   GLUCOSE 92 05/06/2019 0710   BUN 99  (H) 05/06/2019 0710   CREATININE 4.13 (H) 05/06/2019 0710   CALCIUM 9.3 05/06/2019 0710   GFRNONAA 14 (L) 05/06/2019 0710   GFRAA 16 (L) 05/06/2019 0710   CBC    Component Value Date/Time   WBC 10.3 04/25/2019 0537   RBC 3.63 (L) 04/25/2019 0537   HGB 10.2 (L) 04/25/2019 0537   HCT 33.6 (L) 04/25/2019 0537   PLT 290 04/25/2019 0537   MCV 92.6 04/25/2019 0537   MCH 28.1 04/25/2019 0537   MCHC 30.4 04/25/2019 0537   RDW 13.3 04/25/2019 0537   LYMPHSABS 1.9 04/19/2019 0532   MONOABS 1.0 04/19/2019 0532   EOSABS 0.2 04/19/2019 0532   BASOSABS 0.0 04/19/2019 0532     Medications: I have reviewed the patient's current medications.  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  Continue CIR Hypertension blood pressure remains well controlled Chronic kidney disease.  Follow-up nephrology DVT prophylaxis.  Continue subcutaneous heparin    Length of stay, days: 19  Gordy Savers , MD 05/07/2019, 10:51 AM

## 2019-05-08 ENCOUNTER — Inpatient Hospital Stay (HOSPITAL_COMMUNITY): Payer: Medicare HMO

## 2019-05-08 ENCOUNTER — Inpatient Hospital Stay (HOSPITAL_COMMUNITY): Payer: Medicare HMO | Admitting: Occupational Therapy

## 2019-05-08 NOTE — Progress Notes (Signed)
[facility] PHYSICAL MEDICINE & REHABILITATION PROGRESS NOTE   Subjective/Complaints:  Severe aphasia, Point to LUQ but using Y/N pt denies, pain, nausea, vomiting diarrhea or constipation ROS: nods no to CP, SOB, Bowel or bladder issues    Objective:   No results found. No results for input(s): WBC, HGB, HCT, PLT in the last 72 hours. Recent Labs    05/06/19 0710  NA 134*  K 4.3  CL 102  CO2 16*  GLUCOSE 92  BUN 99*  CREATININE 4.13*  CALCIUM 9.3    Intake/Output Summary (Last 24 hours) at 05/08/2019 0921 Last data filed at 05/08/2019 0807 Gross per 24 hour  Intake 580 ml  Output 775 ml  Net -195 ml     Physical Exam: Vital Signs Blood pressure (!) 116/57, pulse 88, temperature 98.3 F (36.8 C), temperature source Oral, resp. rate 18, height 5\' 4"  (1.626 m), weight 80.4 kg, SpO2 99 %. Constitutional: No distress . Vital signs reviewed. HEENT: EOMI, oral membranes moist Neck: supple Cardiovascular: RRR without murmur. No JVD    Respiratory: CTA Bilaterally without wheezes or rales. Normal effort    GI: BS +, non-tender, non-distended . Musc: No edema or tenderness in extremities. Neurologic: Alert Motor: LLE: 5/5 proximal distal RLE: 3-/5 proximal to distal  TOne  MAS 3 Hip Add , Elbow flex, knee flex Expressive aphasia, nods y/n Skin no bruising over the R biceps , hip add, or hamstring area Assessment/Plan: 1. 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  Physiatrist is providing close team supervision and 24 hour management of active medical problems listed below.  Physiatrist and rehab team continue to assess barriers to discharge/monitor patient progress toward functional and medical goals  Care Tool:  Bathing    Body parts bathed by patient: Right arm, Chest, Abdomen, Front perineal area, Left upper leg,  Right upper leg, Face, Right lower leg, Left lower leg   Body parts bathed by helper: Buttocks, Left arm Body parts n/a: Right lower leg, Left lower leg   Bathing assist Assist Level: Moderate Assistance - Patient 50 - 74%     Upper Body Dressing/Undressing Upper body dressing   What is the patient wearing?: Pull over shirt    Upper body assist Assist Level: Moderate Assistance - Patient 50 - 74%    Lower Body Dressing/Undressing Lower body dressing      What is the patient wearing?: Pants     Lower body assist Assist for lower body dressing: Moderate Assistance - Patient 50 - 74%     Toileting Toileting    Toileting assist Assist for toileting: Maximal Assistance - Patient 25 - 49%     Transfers Chair/bed transfer  Transfers assist     Chair/bed transfer assist level: Dependent - mechanical lift(stedy)     Locomotion Ambulation   Ambulation assist   Ambulation activity did not occur: Safety/medical concerns  Assist level: 2 helpers Assistive device: Other (comment)(3 muskateer) Max distance: 5 ft   Walk 10 feet activity   Assist  Walk 10 feet activity did not occur: Safety/medical concerns  Assist level: 2 helpers Assistive device: Other (comment)(3 muskateer)   Walk 50 feet activity   Assist Walk 50 feet with 2 turns activity did not occur: Safety/medical concerns         Walk 150 feet activity   Assist Walk 150 feet activity did not  occur: Safety/medical concerns         Walk 10 feet on uneven surface  activity   Assist Walk 10 feet on uneven surfaces activity did not occur: Safety/medical concerns         Wheelchair     Assist Will patient use wheelchair at discharge?: Yes Type of Wheelchair: Manual    Wheelchair assist level: Dependent - Patient 0% Max wheelchair distance: 150 ft    Wheelchair 50 feet with 2 turns activity    Assist        Assist Level: Dependent - Patient 0%   Wheelchair 150 feet  activity     Assist     Assist Level: Dependent - Patient 0%    Medical Problem List and Plan: 1.Right hemiparesis and aphasia/dysphagiasecondary to left insular and posterior frontal infarct embolic secondary to non-STEMI. Status post loop recorder  Continue CIR PT, OT, SLP, 2. Antithrombotics: -DVT/anticoagulation:Subcutaneous heparin. -antiplatelet therapy: Aspirin 325 mg daily, Plavix 75 mg daily 3. Pain Management:Tylenol as needed 4. Mood:Provide emotional support -antipsychotic agents: N/A 5. Neuropsych: This patientisnot fully capable of making decisions on hisown behalf. 6. Skin/Wound Care:Routine skin checks 7. Fluids/Electrolytes/Nutrition:Routine in and outs  Hyperkalemia, improved on [place], this has now been discontinued and potassium continues to fall, nephrology has signed off 8.Dysphagia. Dysphagia #1 honey thick liquids. I 67024ml-improving  Advanced to D2 thins, continue to advance as tolerated   Monitor for aspiration    Off IVF currently 9.Acute on chronic kidney disease stage III/hyperkalemia with obstructive uropathy/BPH. Follow-up urology services as well as nephrology services. -FOLEY TUBE TO REMAIN IN PLACE UNTIL FOLLOW UP OUT PATIENT WITH UROLOGY  Creatinine 5.14 on 5/3 to 4.11 on 5/7 Diuretics being held by nephro  Down to 3.85 on 5/11, BUN elevated , enc fluid 10.Hyperlipidemia. Lipitor 11.Hypertension. Lopressor 25 mg twice daily. Monitor with increased mobility Vitals:   05/07/19 2016 05/08/19 0534  BP: (!) 115/51 (!) 116/57  Pulse: 89 88  Resp: 18 18  Temp: 97.6 F (36.4 C) 98.3 F (36.8 C)  SpO2: 99% 99%   Controlled 5/18 12.OSA. Continue CPAP 13.  Acute blood loss anemia  Hemoglobin 9.5 on 4/29, 10.2 on 5/5  Continue to monitor 14.  Constipation alternating with loose stools, resolved 15.  Severe spasticity , s/p dysport injection 5/14 to R biceps, Hamstrings, and add  longus- expect improvement this week   20 days A FACE TO FACE EVALUATION WAS PERFORMED  Erick Colacendrew E Kirsteins 05/08/2019, 9:21 AM

## 2019-05-08 NOTE — Progress Notes (Signed)
Occupational Therapy Session Note  Patient Details  Name: Gregory [name] MRN: [id number] Date of Birth: [birth year]  Today's Date: 05/08/2019 OT Individual Time: 1420-1530 OT Individual Time Calculation (min): 70 min    Short Term Goals: Week 3:  OT Short Term Goal 1 (Week 3): Pt will complete toilet transfer with mod assist. OT Short Term Goal 2 (Week 3): Pt will complete LB dressing with mod assist for pulling pants over hips.   OT Short Term Goal 3 (Week 3): Pt will complete LB bathing with mod assist sit to stand.   OT Short Term Goal 4 (Week 3): Pt will perform walk-in shower transfer with mod assist stand pivot.   Skilled Therapeutic Interventions/Progress Updates:    Pt transferred to the wheelchair from the bed with mod assist squat pivot.  He then worked on grooming, bathing, and dressing sit to stand at the sink.  Mod assist for thoroughness with shaving on the right side of his face secondary to motor planning difficulty and manipulating the razor correctly with the left hand.  Pt with slight nick just above the right side of his lip.  After shaving he completed UB bathing with min assist and mod assist for LB bathing sit to stand.  Max hand over hand needed for integration of the RUE for washing and drying the left arm.  In standing, he needs max facilitation for right knee and hip extension, with limited trunk extension noted in standing.  All LB dressing was completed at max assist sit to stand.  Finished session with pt transferring back to the bed with mod assist stand pivot.  Mod assist for transfer from sitting to supine.  Call button and phone in reach and safety alarm in place.   Therapy Documentation Precautions:  Precautions Precautions: Fall Precaution Comments: Expressive aphasia, R hemiparesis Restrictions Weight Bearing Restrictions: No  Pain: Pain Assessment Pain Scale: Faces Pain Score: 0-No pain ADL: See Care Tool Section for some details of  ADL  Therapy/Group: Individual Therapy  Breelynn Bankert OTR/L 05/08/2019, 3:39 PM

## 2019-05-08 NOTE — Progress Notes (Signed)
Speech Language Pathology Daily Session Note  Patient Details  Name: Gregory [name] MRN: [id number] Date of Birth: May 21, 1951  Today's Date: 05/08/2019 SLP Individual Time: 1100-1205 SLP Individual Time Calculation (min): 65 min  Short Term Goals: Week 3: SLP Short Term Goal 1 (Week 3): Pt will tolerate Dys3 diet and thin liquids without overt s/s aspiration given min A verbal and visual cues for use of safe swallow strategies. SLP Short Term Goal 2 (Week 3): Pt will produce 3-5 vowel sounds and or C-V combinations with 75% accuracy given mod verbal/visual cues. SLP Short Term Goal 3 (Week 3): Pt will demonstrate comprehension of functional written information with 80% accuracy given min cues SLP Short Term Goal 4 (Week 3): Pt will demonstrate ability to spell functional words (via pen and paper or communication board) with 80% accuracy given mod cues  Skilled Therapeutic Interventions:Skilled ST services focused on swallow and language skills. SLP facilitated speech production of /p/ CVC words given picture, required initial assistance in the production of /pie/ and /pup/, however able to preform 100% accuracy given multiple trials.  Pt demonstrated ability to produce/imitate /b/ CVC words in all vowel combinations with 50% accuracy. Pt was not stimulable for for the  Production of /m/ and /f/. SLP facilitated locating letters on alphabet board, pt required min A written and repetition cues ( for 6 out 26 letters) and required min A verbal cues for error awareness. SLP facilitated PO consumption of lunch tray, dys 3 and thin liquids, pt demonstrated mild anterior spillage of thin via cup and mild right buccal pocketing of solids, however pt demonstrated ability to clear with lingual sweeps and extra time. SLP upgraded supervision A to  intermittent supervision due to pt's ability to utilize swallow strategies. SLP assisted pt into bed upon request, required min A with stedy.  Pt was left in room  with call bell within reach and bed alarm set. ST recommends to continue skilled ST services.      Pain Pain Assessment Pain Scale: 0-10 Pain Score: 0-No pain Faces Pain Scale: No hurt  Therapy/Group: Individual Therapy  Vienne Corcoran  [facility] 05/08/2019, 12:35 PM

## 2019-05-08 NOTE — Progress Notes (Signed)
Fluid filled blister noted to right heel. Foam dressing applied and elevated off bed with pillow. Prevalon boot ordered. Last charted BM 05/14, patient refused colace, shaking head no. Explained R/T no BM for 3 days, continued to refuse. Also, refused CPAP. 20-30 second periods of apnea, when patient's asleep. Ate 1/2 of applesauce, but refused any other PO intake. Foley patent with amber urine. Gregory [name] A

## 2019-05-08 NOTE — Progress Notes (Signed)
Physical Therapy Session Note  Patient Details  Name: Gregory [name] MRN: [id number] Date of Birth: [birth year]  Today's Date: 05/08/2019 PT Individual Time: 6295-2841 PT Individual Time Calculation (min): 40 min   Short Term Goals: Week 3:  PT Short Term Goal 1 (Week 3): Pt will perform bed<>chair transfer with min assist PT Short Term Goal 2 (Week 3): Pt will ambulate x 20 ft with Max assist and LRAD PT Short Term Goal 3 (Week 3): Pt will maintain standing balance x 3 minutes with LRAD and min assist   Skilled Therapeutic Interventions/Progress Updates:    Pt supine in bed upon PT arrival, agreeable to therapy tx and denies pain at rest. Pt transferred to sitting EOB with mod assist. Pt attempted to perform stand pivot with RW towards the R however pt with increased R lateral LOB and transfer becoming unsafe. Therapist decided to try slideboard transfer this session. Pt performed level slideboard transfer to the R from bed>w/c with min assist and cues for techniques. Pt transported to the gym. Pt performed incline slideboard transfer to the R from chair>mat with mod assist and cues for techniques. Pt transferred to supine on wedge with LEs hanging off edge of mat while therapist performed thomas hip flexor stretching 2 x 1 min to R LE. Therapist performed R hamstring and hip adductor stretching 2 x 1 min each. Pt transferred back to sitting with mod assist. Pt performed sit<>stands x 3 with L UE over therapists shoulder, in standing worked on increased hip extension with therapist providing manual facilitation, working on midline orientation with use of mirror for visual feedback. Pt transferred back to w/c with slideboard and min assist, transported back to room and left with needs in reach and chair alarm set.   Therapy Documentation Precautions:  Precautions Precautions: Fall Precaution Comments: Expressive aphasia, R hemiparesis Restrictions Weight Bearing Restrictions:  No    Therapy/Group: Individual Therapy  Cresenciano Genre, PT, DPT 05/08/2019, 7:55 AM

## 2019-05-08 NOTE — Plan of Care (Signed)
  Problem: Consults Goal: RH STROKE PATIENT EDUCATION Description See Patient Education module for education specifics  Outcome: Progressing   Problem: RH BOWEL ELIMINATION Goal: RH STG MANAGE BOWEL WITH ASSISTANCE Description STG Manage Bowel with Mod Assistance.  Outcome: Progressing   Problem: RH BLADDER ELIMINATION Goal: RH STG MANAGE BLADDER WITH EQUIPMENT WITH ASSISTANCE Description STG Manage Bladder With Equipment With Max Assistance  Outcome: Progressing   Problem: RH SKIN INTEGRITY Goal: RH STG SKIN FREE OF INFECTION/BREAKDOWN Description No new breakdown with min assist   Outcome: Progressing   Problem: RH SAFETY Goal: RH STG ADHERE TO SAFETY PRECAUTIONS W/ASSISTANCE/DEVICE Description STG Adhere to Safety Precautions With Min Assistance/Device.  Outcome: Progressing   Problem: RH COGNITION-NURSING Goal: RH STG USES MEMORY AIDS/STRATEGIES W/ASSIST TO PROBLEM SOLVE Description STG Uses Memory Aids/Strategies With Min Assistance to Problem Solve.  Outcome: Progressing   Problem: RH PAIN MANAGEMENT Goal: RH STG PAIN MANAGED AT OR BELOW PT'S PAIN GOAL Description < 3 out of 10.    Outcome: Progressing   

## 2019-05-08 NOTE — Progress Notes (Signed)
Patient refused colace during shift. No bowel movement in 72 hours per chart. Patient continues to refuse. Education provided.   Kalman Shan, LPN

## 2019-05-09 ENCOUNTER — Inpatient Hospital Stay (HOSPITAL_COMMUNITY): Payer: Medicare HMO | Admitting: Occupational Therapy

## 2019-05-09 ENCOUNTER — Inpatient Hospital Stay (HOSPITAL_COMMUNITY): Payer: Medicare HMO

## 2019-05-09 NOTE — Progress Notes (Signed)
Speech Language Pathology Daily Session Note  Patient Details  Name: Gregory [name] MRN: [id number] Date of Birth: 10-Oct-1951  Today's Date: 05/09/2019 SLP Individual Time: 1130-1230 SLP Individual Time Calculation (min): 60 min  Short Term Goals: Week 3: SLP Short Term Goal 1 (Week 3): Pt will tolerate Dys3 diet and thin liquids without overt s/s aspiration given min A verbal and visual cues for use of safe swallow strategies. SLP Short Term Goal 2 (Week 3): Pt will produce 3-5 vowel sounds and or C-V combinations with 75% accuracy given mod verbal/visual cues. SLP Short Term Goal 3 (Week 3): Pt will demonstrate comprehension of functional written information with 80% accuracy given min cues SLP Short Term Goal 4 (Week 3): Pt will demonstrate ability to spell functional words (via pen and paper or communication board) with 80% accuracy given mod cues  Skilled Therapeutic Interventions: Skilled ST services focused on language skills. SLP facilitated speech production of familiar initial /p/ CV and CVC words when presented with pictures, pt demonstrated ability to produce 7 out 7 words. SLP facilitated speech production of novel initial /b/ CV and CVC words when presented with pictures, in initial trial pt demonstrated ability to produce 7 out 10 words. SLP facilitated instrcution of novel /b/ CV and CVC words utilizing sentence completion, imitation cues and multiple trials. Following instruction of initial /b/ CV and CVC words, pt demonstrated ability to produce 4 out 10 words with presented with picture. SLP noted pt was stimuliable for initial /b/ CV words and initial /b/ CVC words with final consonants ending in /s/ and /t/, not stimulable for final consonants /k/ and /g/ . SLP assisted pt into bed with stedy, per pt request via response to yes/no questions and expressing continued discomfort in WC. SLP facilitated spelling of initial /b/ CVC (3 letter) words with picture and utilization of  alphabet board, pt was able to select initial letter on alphabet board and required a written field of 4 letters to select vowel and final consonant.  Pt was left in room with call bell within reach. ST recommends to continue skilled ST services.      Pain Pain Assessment Pain Score: 0-No pain  Therapy/Group: Individual Therapy    Dora Simeone  [facility] 05/09/2019, 3:10 PM

## 2019-05-09 NOTE — Progress Notes (Signed)
Physical Therapy Session Note  Patient Details  Name: Gregory [name] MRN: [id number] Date of Birth: [birth year]  Today's Date: 05/09/2019 PT Individual Time: 0900-0945 PT Individual Time Calculation (min): 45 min   Short Term Goals: Week 3:  PT Short Term Goal 1 (Week 3): Pt will perform bed<>chair transfer with min assist PT Short Term Goal 2 (Week 3): Pt will ambulate x 20 ft with Max assist and LRAD PT Short Term Goal 3 (Week 3): Pt will maintain standing balance x 3 minutes with LRAD and min assist   Skilled Therapeutic Interventions/Progress Updates:      Pt supine in bed upon PT arrival, agreeable to therapy tx and denies pain at rest. Pt transferred to sitting EOB with mod assist.Pt performed level slideboard transfer to the R from bed>w/c with min assist and cues for techniques. Pt transported to the gym. PT performed car transfer this session with mod assist, cues for techniques and therapist providing facilitation for R UE placement/use. Pt performed incline slideboard transfer to the R from chair>mat with mod assist and cues for techniques. Pt transferred to supine on wedge with LEs hanging off edge of mat while therapist performed thomas hip flexor stretching 2 x 1 min to R LE. Therapist performed R hamstring and hip adductor stretching 2 x 1 min each. Pt transferred back to sitting with mod assist. Pt performed sit<>stand x1 with L UE over therapists shoulder, in standing worked on increased hip extension with therapist providing manual facilitation. Pt transferred back to w/c with slideboard and min assist, transported back to room and left with needs in reach and chair alarm set.   Therapy Documentation Precautions:  Precautions Precautions: Fall Precaution Comments: Expressive aphasia, R hemiparesis Restrictions Weight Bearing Restrictions: No   Therapy/Group: Individual Therapy  Cresenciano Genre, PT, DPT 05/09/2019, 7:49 AM

## 2019-05-09 NOTE — Progress Notes (Signed)
[place] PHYSICAL MEDICINE & REHABILITATION PROGRESS NOTE   Subjective/Complaints:  Severe aphasia, Also with severe apraxia ROS: Difficult to assess accuracy secondary to his communication issues   Objective:   No results found. No results for input(s): WBC, HGB, HCT, PLT in the last 72 hours. No results for input(s): NA, K, CL, CO2, GLUCOSE, BUN, CREATININE, CALCIUM in the last 72 hours.  Intake/Output Summary (Last 24 hours) at 05/09/2019 0824 Last data filed at 05/09/2019 0500 Gross per 24 hour  Intake 444 ml  Output 600 ml  Net -156 ml     Physical Exam: Vital Signs Blood pressure 111/66, pulse 90, temperature 98.5 F (36.9 C), temperature source Oral, resp. rate 17, height 5\' 4"  (1.626 m), weight 80.4 kg, SpO2 100 %. Constitutional: No distress . Vital signs reviewed. HEENT: EOMI, oral membranes moist Neck: supple Cardiovascular: RRR without murmur. No JVD    Respiratory: CTA Bilaterally without wheezes or rales. Normal effort    GI: BS +, non-tender, non-distended . Musc: No edema or tenderness in extremities. Neurologic: Alert Motor: LLE: 5/5 proximal distal RLE: 3-/5 proximal to distal  TOne  MAS 3 Hip Add , Elbow flex, knee flex Expressive aphasia, nods y/n  Assessment/Plan: 1. 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  Physiatrist is providing close team supervision and 24 hour management of active medical problems listed below.  Physiatrist and rehab team continue to assess barriers to discharge/monitor patient progress toward functional and medical goals  Care Tool:  Bathing    Body parts bathed by patient: Right arm, Chest, Abdomen, Front perineal area, Left upper leg, Right upper leg, Face, Right lower leg, Left lower leg   Body parts bathed by helper: Buttocks, Left arm Body parts n/a: Right lower leg,  Left lower leg   Bathing assist Assist Level: Moderate Assistance - Patient 50 - 74%     Upper Body Dressing/Undressing Upper body dressing   What is the patient wearing?: Pull over shirt    Upper body assist Assist Level: Moderate Assistance - Patient 50 - 74%    Lower Body Dressing/Undressing Lower body dressing      What is the patient wearing?: Pants     Lower body assist Assist for lower body dressing: Moderate Assistance - Patient 50 - 74%     Toileting Toileting    Toileting assist Assist for toileting: Maximal Assistance - Patient 25 - 49%     Transfers Chair/bed transfer  Transfers assist     Chair/bed transfer assist level: Moderate Assistance - Patient 50 - 74%     Locomotion Ambulation   Ambulation assist   Ambulation activity did not occur: Safety/medical concerns  Assist level: 2 helpers Assistive device: Other (comment)(3 muskateer) Max distance: 5 ft   Walk 10 feet activity   Assist  Walk 10 feet activity did not occur: Safety/medical concerns  Assist level: 2 helpers Assistive device: Other (comment)(3 muskateer)   Walk 50 feet activity   Assist Walk 50 feet with 2 turns activity did not occur: Safety/medical concerns         Walk 150 feet activity   Assist Walk 150 feet activity did not occur: Safety/medical concerns         Walk 10 feet on uneven surface  activity   Assist Walk 10 feet on uneven surfaces activity did not occur: Safety/medical concerns  Wheelchair     Assist Will patient use wheelchair at discharge?: Yes Type of Wheelchair: Manual    Wheelchair assist level: Dependent - Patient 0% Max wheelchair distance: 150 ft    Wheelchair 50 feet with 2 turns activity    Assist        Assist Level: Dependent - Patient 0%   Wheelchair 150 feet activity     Assist     Assist Level: Dependent - Patient 0%    Medical Problem List and Plan: 1.Right hemiparesis and  aphasia/dysphagiasecondary to left insular and posterior frontal infarct embolic secondary to non-STEMI. Status post loop recorder  Continue CIR PT, OT, SLP, team conference in a.m., 2. Antithrombotics: -DVT/anticoagulation:Subcutaneous heparin. -antiplatelet therapy: Aspirin 325 mg daily, Plavix 75 mg daily 3. Pain Management:Tylenol as needed 4. Mood:Provide emotional support -antipsychotic agents: N/A 5. Neuropsych: This patientisnot fully capable of making decisions on hisown behalf. 6. Skin/Wound Care:Routine skin checks 7. Fluids/Electrolytes/Nutrition:Routine in and outs  Hyperkalemia, improved on Veltassa, this has now been discontinued and potassium continues to fall, nephrology has signed off 8.Dysphagia. Dysphagia #1 honey thick liquids. I 79184ml-improving  Advanced to D2 thins, continue to advance as tolerated   Monitor for aspiration    Off IVF currently 9.Acute on chronic kidney disease stage III/hyperkalemia with obstructive uropathy/BPH. Follow-up urology services as well as nephrology services. -FOLEY TUBE TO REMAIN IN PLACE UNTIL FOLLOW UP OUT PATIENT WITH UROLOGY  Creatinine 5.14 on 5/3 to 4.11 on 5/7 Diuretics being held by nephro  Down to 3.85 on 5/11, BUN elevated , enc fluid 10.Hyperlipidemia. Lipitor 11.Hypertension. Lopressor 25 mg twice daily. Monitor with increased mobility Vitals:   05/08/19 1955 05/09/19 0554  BP: (!) 120/55 111/66  Pulse: 94 90  Resp: 20 17  Temp: 98.3 F (36.8 C) 98.5 F (36.9 C)  SpO2: 100% 100%   Controlled 5/19 12.OSA. Continue CPAP 13.  Acute blood loss anemia  Hemoglobin 9.5 on 4/29, 10.2 on 5/5  Continue to monitor 14.  Constipation alternating with loose stools, resolved 15.  Severe spasticity , s/p dysport injection 5/14 to R biceps, Hamstrings, and add longus- expect improvement this week   21 days A FACE TO FACE EVALUATION WAS PERFORMED  Erick Colacendrew E  Sharyon Peitz 05/09/2019, 8:24 AM

## 2019-05-09 NOTE — Progress Notes (Signed)
Patient continues to refuse colace during shift. This Clinical research associate offered alternatives and patient refused. No bowel movement noted on 72 hours. Education provided. Kalman Shan, LPN

## 2019-05-09 NOTE — Plan of Care (Signed)
  Problem: Consults Goal: RH STROKE PATIENT EDUCATION Description See Patient Education module for education specifics  Outcome: Progressing   Problem: RH BOWEL ELIMINATION Goal: RH STG MANAGE BOWEL WITH ASSISTANCE Description STG Manage Bowel with Mod Assistance.  Outcome: Progressing   Problem: RH BLADDER ELIMINATION Goal: RH STG MANAGE BLADDER WITH EQUIPMENT WITH ASSISTANCE Description STG Manage Bladder With Equipment With Max Assistance  Outcome: Progressing   Problem: RH SKIN INTEGRITY Goal: RH STG SKIN FREE OF INFECTION/BREAKDOWN Description No new breakdown with min assist   Outcome: Progressing   Problem: RH SAFETY Goal: RH STG ADHERE TO SAFETY PRECAUTIONS W/ASSISTANCE/DEVICE Description STG Adhere to Safety Precautions With Min Assistance/Device.  Outcome: Progressing   Problem: RH COGNITION-NURSING Goal: RH STG USES MEMORY AIDS/STRATEGIES W/ASSIST TO PROBLEM SOLVE Description STG Uses Memory Aids/Strategies With Min Assistance to Problem Solve.  Outcome: Progressing   Problem: RH PAIN MANAGEMENT Goal: RH STG PAIN MANAGED AT OR BELOW PT'S PAIN GOAL Description < 3 out of 10.    Outcome: Progressing   

## 2019-05-09 NOTE — Progress Notes (Signed)
Occupational Therapy Session Note  Patient Details  Name: Gregory [name] MRN: [id number] Date of Birth: [birth year]  Today's Date: 05/09/2019 OT Individual Time: 1419-1530 OT Individual Time Calculation (min): 71 min    Short Term Goals: Week 3:  OT Short Term Goal 1 (Week 3): Pt will complete toilet transfer with mod assist. OT Short Term Goal 2 (Week 3): Pt will complete LB dressing with mod assist for pulling pants over hips.   OT Short Term Goal 3 (Week 3): Pt will complete LB bathing with mod assist sit to stand.   OT Short Term Goal 4 (Week 3): Pt will perform walk-in shower transfer with mod assist stand pivot.   Skilled Therapeutic Interventions/Progress Updates:    Pt completed supine to sit EOB with mod assist.  Once sitting, therapist attempted use of the [place] Dec Plus for standing to increase hip and trunk extension in standing.  Completed three attempts of standing with some slight improvement noted with trunk extension, however pt would slowly slide down into the machine instead of being able to remain propped up on his elbows.  Transitioned to completion of sit to stand from the EOB with use of the RW and hand splint on the right side.  He was able to complete sit to stand with mod assist with the RUE placed on the hand splint for starting position.  Attempted to take a small step to the right with the RW but he was unable to advance the RLE and began to sink back into trunk and hip flexion, resulting in sitting back down on the seat.  Next had pt complete stand pivot transfer to the wheelchair and therapist took him down to the therapy gym where he transferred to the therapy mat at the same level.  Session then focused on RUE weightbearing and neuromuscular re-education.  He was able to demonstrate some active elbow extension in the RUE but still with increased flexor tone present.  He was able to push a tilted stool with min facilitation to help keep the humerus in neutral rotation.   Therapist applied NMES to the right digit flexors and extensors as well while having pt work on simulated functional reach.  Intensity of flexors was 35 with extensors at 19.  Alternating program setting used with 10 seconds on and 5 seconds off after each complete cycle.  PPS were at 35 with pulse width at 300.  No adverse reactions to stimuli noted.  Finished session with transfer back to the wheelchair with return to the room and transfer back to the bed at max assist level.  Call button and phone in reach with bed alarm in place.    Therapy Documentation Precautions:  Precautions Precautions: Fall Precaution Comments: Expressive aphasia, R hemiparesis Restrictions Weight Bearing Restrictions: No  Pain: Pain Assessment Pain Scale: Faces Pain Score: 0-No pain ADL: See Care Tool Section for some ADL details  Therapy/Group: Individual Therapy  Sylvio Weatherall OTR/L 05/09/2019, 4:02 PM

## 2019-05-09 NOTE — Progress Notes (Signed)
Pt alert and uses gestures/nods to answer questions. He refused the scheduled stool softener (Colace). Pt has been educated on the consequences of refusing medications. Will continue monitoring the patient.

## 2019-05-10 ENCOUNTER — Ambulatory Visit (INDEPENDENT_AMBULATORY_CARE_PROVIDER_SITE_OTHER): Payer: Medicare HMO | Admitting: *Deleted

## 2019-05-10 ENCOUNTER — Inpatient Hospital Stay (HOSPITAL_COMMUNITY): Payer: Medicare HMO | Admitting: Occupational Therapy

## 2019-05-10 ENCOUNTER — Inpatient Hospital Stay (HOSPITAL_COMMUNITY): Payer: Medicare HMO

## 2019-05-10 DIAGNOSIS — I63412 Cerebral infarction due to embolism of left middle cerebral artery: Secondary | ICD-10-CM | POA: Diagnosis not present

## 2019-05-10 NOTE — Plan of Care (Signed)
  Problem: Consults Goal: RH STROKE PATIENT EDUCATION Description See Patient Education module for education specifics  Outcome: Progressing   Problem: RH BOWEL ELIMINATION Goal: RH STG MANAGE BOWEL WITH ASSISTANCE Description STG Manage Bowel with Mod Assistance.  Outcome: Progressing Flowsheets (Taken 05/10/2019 1448) STG: Pt will manage bowels with assistance: 3-Moderate assistance   Problem: RH BLADDER ELIMINATION Goal: RH STG MANAGE BLADDER WITH EQUIPMENT WITH ASSISTANCE Description STG Manage Bladder With Equipment With Max Assistance  Outcome: Progressing Flowsheets (Taken 05/10/2019 1448) STG: Pt will manage bladder with equipment with assistance: 3-Moderate assistance   Problem: RH SKIN INTEGRITY Goal: RH STG SKIN FREE OF INFECTION/BREAKDOWN Description No new breakdown with min assist   Outcome: Progressing   Problem: RH SAFETY Goal: RH STG ADHERE TO SAFETY PRECAUTIONS W/ASSISTANCE/DEVICE Description STG Adhere to Safety Precautions With Min Assistance/Device.  Outcome: Progressing Flowsheets (Taken 05/10/2019 1448) STG:Pt will adhere to safety precautions with assistance/device: 3-Moderate assistance   Problem: RH COGNITION-NURSING Goal: RH STG USES MEMORY AIDS/STRATEGIES W/ASSIST TO PROBLEM SOLVE Description STG Uses Memory Aids/Strategies With Min Assistance to Problem Solve.  Outcome: Progressing Flowsheets (Taken 05/10/2019 1448) STG: Uses memory aids/strategies with assistance: 3-Moderate assistance   Problem: RH PAIN MANAGEMENT Goal: RH STG PAIN MANAGED AT OR BELOW PT'S PAIN GOAL Description < 3 out of 10.    Outcome: Progressing

## 2019-05-10 NOTE — Progress Notes (Signed)
Speech Language Pathology Weekly Progress and Session Note  Patient Details  Name: Gregory [name] MRN: [id number] Date of Birth: 16-Sep-1951  Beginning of progress report period: May 04, 2019 End of progress report period: May 10, 2019  Today's Date: 05/10/2019 SLP Individual Time: 7322-0254 SLP Individual Time Calculation (min): 44 min  Short Term Goals: Week 3: SLP Short Term Goal 1 (Week 3): Pt will tolerate Dys3 diet and thin liquids without overt s/s aspiration given min A verbal and visual cues for use of safe swallow strategies. SLP Short Term Goal 1 - Progress (Week 3): Met SLP Short Term Goal 2 (Week 3): Pt will produce 3-5 vowel sounds and or C-V combinations with 75% accuracy given mod verbal/visual cues. SLP Short Term Goal 2 - Progress (Week 3): Met SLP Short Term Goal 3 (Week 3): Pt will demonstrate comprehension of functional written information with 80% accuracy given min cues SLP Short Term Goal 3 - Progress (Week 3): Met SLP Short Term Goal 4 (Week 3): Pt will demonstrate ability to spell functional words (via pen and paper or communication board) with 80% accuracy given mod cues SLP Short Term Goal 4 - Progress (Week 3): Met    New Short Term Goals: Week 4: SLP Short Term Goal 1 (Week 4): STG=LTG due to short ELOS  Weekly Progress Updates: Pt made great progress meeting 4 out 4 goals. Pt demonstrated ability to produce CV and CVC words/speech sounds in initial /p/ and /b/ words. Pt demonstrated ability to select letters from alphabet board with min A verbals. Pt demonstrates ability to spell CVC 3 letter words on alphabet board with mod A verbal cues. Pt is currently consuming dys 3 an thin with intermittent supervision and trials of regular textures. SLP will focus on education for functional communication, production of frontal speech sounds in CV and CVC, spelling CVC words on alphabet board, reading comprehension at sentence level and trials of regular textures for  solid upgrade. Pt would benefit from skilled ST services in order to maximize functional independence and reduce burden of care, requiring 24 hour supervision and continue ST services.     Intensity: Minumum of 1-2 x/day, 30 to 90 minutes Frequency: 3 to 5 out of 7 days Duration/Length of Stay: 05/17/2019 Treatment/Interventions: Environmental controls;Speech/Language facilitation;Cueing hierarchy;Functional tasks;Therapeutic Activities;Oral motor exercises;Dysphagia/aspiration precaution training;Patient/family education   Daily Session  Skilled Therapeutic Interventions: Skilled ST services focused on language skills. SLP facilitated reading comprehension of functional language, pt demonstrated ability to read and select appropriate information among multiple sentences in 4 out 10 (increase from 3 out 10 from piror testing) and semantic comprehension at word level given three choices in 7 out 10 opportunities (increase of 6 out 10 from piror testing.)  Pt was left in room with call bell within reach and bed alarm set. ST recommends to continue skilled ST services.     General    Pain Pain Assessment Pain Score: 0-No pain  Therapy/Group: Individual Therapy  Herlinda Heady  [facility] 05/10/2019, 4:57 PM

## 2019-05-10 NOTE — Progress Notes (Signed)
[place] PHYSICAL MEDICINE & REHABILITATION PROGRESS NOTE   Subjective/Complaints:  Severe aphasia, Also with severe apraxia CHronic foley  ROS: Difficult to assess accuracy secondary to his communication issues   Objective:   No results found. No results for input(s): WBC, HGB, HCT, PLT in the last 72 hours. No results for input(s): NA, K, CL, CO2, GLUCOSE, BUN, CREATININE, CALCIUM in the last 72 hours.  Intake/Output Summary (Last 24 hours) at 05/10/2019 1104 Last data filed at 05/10/2019 0526 Gross per 24 hour  Intake 666 ml  Output 550 ml  Net 116 ml     Physical Exam: Vital Signs Blood pressure (!) 103/59, pulse (!) 47, temperature 98 F (36.7 C), resp. rate 17, height '5\' 4"'$  (1.626 m), weight 80 kg, SpO2 99 %. Constitutional: No distress . Vital signs reviewed. HEENT: EOMI, oral membranes moist Neck: supple Cardiovascular: RRR without murmur. No JVD    Respiratory: CTA Bilaterally without wheezes or rales. Normal effort    GI: BS +, non-tender, non-distended . Musc: No edema or tenderness in extremities. Neurologic: Alert Motor: LLE: 5/5 proximal distal Right 3- HF, KE, 2- RIgth t delt, bi, tri, tr ankle TOne  MAS 2 Hip Add , Elbow flex, knee flex Expressive aphasia, nods y/n  Assessment/Plan: 1. 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  Physiatrist is providing close team supervision and 24 hour management of active medical problems listed below.  Physiatrist and rehab team continue to assess barriers to discharge/monitor patient progress toward functional and medical goals  Care Tool:  Bathing    Body parts bathed by patient: Right arm, Chest, Abdomen, Front perineal area, Left upper leg, Right upper leg, Face, Right lower leg, Left lower leg   Body parts bathed by helper: Buttocks, Left arm Body parts n/a:  Right lower leg, Left lower leg   Bathing assist Assist Level: Moderate Assistance - Patient 50 - 74%     Upper Body Dressing/Undressing Upper body dressing   What is the patient wearing?: Pull over shirt    Upper body assist Assist Level: Moderate Assistance - Patient 50 - 74%    Lower Body Dressing/Undressing Lower body dressing      What is the patient wearing?: Pants     Lower body assist Assist for lower body dressing: Moderate Assistance - Patient 50 - 74%     Toileting Toileting    Toileting assist Assist for toileting: Maximal Assistance - Patient 25 - 49%     Transfers Chair/bed transfer  Transfers assist     Chair/bed transfer assist level: Moderate Assistance - Patient 50 - 74%(slideboard)     Locomotion Ambulation   Ambulation assist   Ambulation activity did not occur: Safety/medical concerns  Assist level: 2 helpers Assistive device: Other (comment)(3 muskateer) Max distance: 5 ft   Walk 10 feet activity   Assist  Walk 10 feet activity did not occur: Safety/medical concerns  Assist level: 2 helpers Assistive device: Other (comment)(3 muskateer)   Walk 50 feet activity   Assist Walk 50 feet with 2 turns activity did not occur: Safety/medical concerns         Walk 150 feet activity   Assist Walk 150 feet activity did not occur: Safety/medical concerns         Walk 10 feet on uneven surface  activity   Assist Walk 10 feet on uneven surfaces activity  did not occur: Safety/medical concerns         Wheelchair     Assist Will patient use wheelchair at discharge?: Yes Type of Wheelchair: Manual    Wheelchair assist level: Dependent - Patient 0% Max wheelchair distance: 150 ft    Wheelchair 50 feet with 2 turns activity    Assist        Assist Level: Dependent - Patient 0%   Wheelchair 150 feet activity     Assist     Assist Level: Dependent - Patient 0%    Medical Problem List and  Plan: 1.Right hemiparesis and aphasia/dysphagiasecondary to left insular and posterior frontal infarct embolic secondary to non-STEMI. Status post loop recorder  Continue CIR PT, OT, SLP, Team conference today please see physician documentation under team conference tab, met with team face-to-face to discuss problems,progress, and goals. Formulized individual treatment plan based on medical history, underlying problem and comorbidities. 2. Antithrombotics: -DVT/anticoagulation:Subcutaneous heparin. -antiplatelet therapy: Aspirin 325 mg daily, Plavix 75 mg daily 3. Pain Management:Tylenol as needed 4. Mood:Provide emotional support -antipsychotic agents: N/A 5. Neuropsych: This patientisnot fully capable of making decisions on hisown behalf. 6. Skin/Wound Care:Routine skin checks 7. Fluids/Electrolytes/Nutrition:Routine in and outs  Hyperkalemia, improved on Veltassa, this has now been discontinued and potassium continues to fall, nephrology has signed off 8.Dysphagia. Dysphagia #1 honey thick liquids. improving  Advanced to D2 thins, continue to advance as tolerated   Monitor for aspiration    Off IVF currently 9.Acute on chronic kidney disease stage III/hyperkalemia with obstructive uropathy/BPH. Follow-up urology services as well as nephrology services. -FOLEY TUBE TO REMAIN IN PLACE UNTIL FOLLOW UP OUT PATIENT WITH UROLOGY  Creatinine 5.14 on 5/3 to 4.11 on 5/7 Diuretics being held by nephro  Down to 3.85 on 5/11, BUN elevated , enc fluid 10.Hyperlipidemia. Lipitor 11.Hypertension. Lopressor 25 mg twice daily. Monitor with increased mobility Vitals:   05/09/19 1943 05/10/19 0527  BP: 128/71 (!) 103/59  Pulse: 90 (!) 47  Resp: 18 17  Temp: 98.6 F (37 C) 98 F (36.7 C)  SpO2: 100% 99%   Controlled 5/20 12.OSA. Continue CPAP 13.  Acute blood loss anemia  Hemoglobin 9.5 on 4/29, 10.2 on 5/5  Continue to monitor 14.   Constipation alternating with loose stools, resolved 15.  Severe spasticity , s/p dysport injection 5/14 to R biceps, Hamstrings, and add longus- expect improvement this week Improving   22 days A FACE TO FACE EVALUATION WAS PERFORMED  Charlett Blake 05/10/2019, 11:04 AM

## 2019-05-10 NOTE — Progress Notes (Signed)
Occupational Therapy Session Note  Patient Details  Name: Gregory [name] MRN: [id number] Date of Birth: 12/08/1951  Today's Date: 05/10/2019 OT Individual Time: 2637-8588 OT Individual Time Calculation (min): 60 min    Short Term Goals: Week 3:  OT Short Term Goal 1 (Week 3): Pt will complete toilet transfer with mod assist. OT Short Term Goal 2 (Week 3): Pt will complete LB dressing with mod assist for pulling pants over hips.   OT Short Term Goal 3 (Week 3): Pt will complete LB bathing with mod assist sit to stand.   OT Short Term Goal 4 (Week 3): Pt will perform walk-in shower transfer with mod assist stand pivot.   Skilled Therapeutic Interventions/Progress Updates:    Pt completed bathing and dressing during session.  Max assist for stand pivot transfer from the bed to the wheelchair as well as max assist for stand pivot to the tub bench.  He needed mod assist for removal of UB clothing with max assist for removal of LB clothing prior to showering.  Min assist for UB bathing in supported sitting with increased lean/weightshift to the right noted. He was able to complete LB bathing with mod assist sit to stand using the grab bar on the left for support.  Max assist for transfer back to the wheelchair stand pivot.  Dressing was then worked on from sit to stand at the sink.  Mod assist for pullover shirt with mod assist for donning brief and pants.  He needed total assist for TEDs and max assist for donning gripper socks.  Max facilitation needed throughout session to integrate the RUE into bathing task as well as for stabilizing items for opening, or when applying deodorant under the LUE.  Finished session with pt in the wheelchair working on lunch.  Call button and phone in reach with safety alarm in place.    Therapy Documentation Precautions:  Precautions Precautions: Fall Precaution Comments: Expressive aphasia, R hemiparesis Restrictions Weight Bearing Restrictions:  No  Pain: Pain Assessment Pain Scale: Faces Pain Score: 0-No pain ADL: See Care Tool Section for some details of ADL  Therapy/Group: Individual Therapy  Brenetta Penny OTR/L 05/10/2019, 3:44 PM

## 2019-05-10 NOTE — Patient Care Conference (Signed)
Inpatient RehabilitationTeam Conference and Plan of Care Update Date: 05/10/2019   Time: 11:13 AM    Patient Name: Gregory [name]      Medical Record Number: [id number]  Date of Birth: January 30, 1951 Sex: Male         Room/Bed: 4W24C/4W24C-01 Payor Info: Payor: [employer] MEDICARE / Plan: [employer] MEDICARE HMO / Product Type: *No Product type* /    Admitting Diagnosis: CVA 2 Team Lt CVA; 22-24days  Admit Date/Time:  04/18/2019  4:32 PM Admission Comments: No comment available   Primary Diagnosis:  <principal problem not specified> Principal Problem: <principal problem not specified>  Patient Active Problem List   Diagnosis Date Noted  . Loose stools   . Labile blood pressure   . Aphasia   . Acute blood loss anemia   . AKI (acute kidney injury) (HCC)   . CKD (chronic kidney disease), stage III (HCC)   . Dysphagia, post-stroke   . 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 Hyperkalemia 04/14/2019  . 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 Cerebral infarction due to embolism of left middle cerebral artery (HCC)   . Stroke (cerebrum) (HCC) 04/05/2019  . Demand ischemia (HCC) 04/05/2019  . 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 Pre-diabetes 09/05/2018  . Acute renal failure superimposed on stage 3 chronic kidney disease (HCC) 09/05/2018  . Bilateral carotid artery stenosis 09/05/2018  . Essential hypertension 09/05/2018  . TIA (transient ischemic attack) 09/04/2018    Expected Discharge Date: Expected Discharge Date: 05/17/19  Team Members Present: Physician leading conference: Dr. Claudette Laws Social Worker Present: Dossie Der, LCSW Nurse Present: Other (comment)(Meredith Surles-RN) PT Present: Woodfin Ganja, PT OT Present: Perrin Maltese, OT SLP Present: Colin Benton, SLP PPS Coordinator present : Fae Pippin     Current Status/Progress Goal Weekly Team Focus  Medical   CKD improving, BP with good control,  variable HR, pt refusing bowel meds          Bowel/Bladder   Patient has foley in place and is incontient of bowel and constopation .  Patient will remain free of infection and patient will have timely bow move.  Work on regular bowel movemnts.   Swallow/Nutrition/ Hydration   dys 3 and thin, intermittent sup A  Mod I  regular trials and carryover of swallow strategies    ADL's   Min assist for UB bathing with min to mod for UB dressing.  Mod assist for LB bathing sit to stand with mod to max for LB drssing.  Max assist for stand pivot transfers to the shower and the 3:1.  Improved tone noted in the elbow flexors with pt demonstrating Brunnstrum stage III movement in the right arm and stage II in the hande  downgraded to min to mod assist  selfcare retraining, transfer training, neuromuscular re-education, balance retraining, pt/family education   Mobility   min-mod assist bed mobility, mod assist sit<>stand, mod-max stand pivot transfer, min-mod assist slideboard transfer, gait unable  min assist  transfers, gait, standing, tone management, balance, midline orientation   Communication   production of CV and CVC frontal speech sounds, spelling CVC words on alphebat board and reading comprehension mod-sup A  Min A multimodal, Max A speech and Sup A semi-complex Auditory comprehension  CV and CVC production, CVC spelling on alphabet board/writtng, semi-comple aduitory comprehension utilzing board and commands   Safety/Cognition/ Behavioral Observations            Pain   No  c/o   Contiune to stay free of pain  Regular Bowel movements    Skin   Skin is dry intact.  Prevent and skin tears and break down.   Assiting the patient to prevent any dreak down and skin tears.       *See Care Plan and progress notes for long and short-term goals.     Barriers to Discharge  Current Status/Progress Possible Resolutions Date Resolved   Physician                    Nursing                  PT   Inaccessible home environment;Home environment access/layout  3-4 STE              OT                  SLP                SW                Discharge Planning/Teaching Needs:  Ex-wfe and son to come in Monday for education in anticipation of DC 5/27. Progressing more this week in therapies.      Team Discussion:  Pt continuing to make slow gains in therapies-need family education to see if can manage him at home. Diet upgraded to Dys 3 thin. CKD better and spascity improved. Downgraded goals to min-mod level of assist. Family to come in Monday for education.   Revisions to Treatment Plan:  DC 5/27        I attest that I was present, lead the team conference, and concur with the assessment and plan of the team. Teleconferenece held due to COVID 19   Lilymae Swiech, Lemar LivingsRebecca G 05/10/2019, 11:13 AM

## 2019-05-10 NOTE — Progress Notes (Signed)
Physical Therapy Weekly Progress Note  Patient Details  Name: Gregory [name] MRN: [id number] Date of Birth: 1951/11/25  Beginning of progress report period: May 03, 2019 End of progress report period: May 10, 2019  Today's Date: 05/10/2019 PT Individual Time: 0626-9485 PT Individual Time Calculation (min): 59 min   Patient has met 1 of 3 short term goals. Pt has not made progress with gait over the past week and continues to be limited by increased R UE/LE tone and spasticity and pain. Pt has began working on transfers with a slideboard to increased independence with transfer training. With fatigue pt demonstrates increased R lateral lean, even during sitting balance.   Patient continues to demonstrate the following deficits muscle weakness and muscle joint tightness, abnormal tone, decreased coordination and decreased motor planning, decreased awareness and decreased standing balance, decreased postural control, hemiplegia and decreased balance strategies and therefore will continue to benefit from skilled PT intervention to increase functional independence with mobility.  Patient progressing toward long term goals..  Continue plan of care. Gait goal has been discontinued as pt is not progressing with gait and will be going home at a wheelchair level. Otherwise pt is progressing towards transfer goals.   PT Short Term Goals Week 3:  PT Short Term Goal 1 (Week 3): Pt will perform bed<>chair transfer with min assist PT Short Term Goal 1 - Progress (Week 3): Met PT Short Term Goal 2 (Week 3): Pt will ambulate x 20 ft with Max assist and LRAD PT Short Term Goal 2 - Progress (Week 3): Not met PT Short Term Goal 3 (Week 3): Pt will maintain standing balance x 3 minutes with LRAD and min assist  PT Short Term Goal 3 - Progress (Week 3): Progressing toward goal Week 4:  PT Short Term Goal 1 (Week 4): STG=LTG due to ELOS  Skilled Therapeutic Interventions/Progress Updates:    Pt seated in w/c  upon PT arrival, agreeable to therapy tx and reports R ankle/heel pain during standing activities this session but unable to rate. Therapist provided seated rest breaks as needed for pain management. Pt transported to the gym. Therapist called pt's son this session in order to discuss d/c planning. Therapist providing education to pt's son regarding pt's current level of function, the pt's goals and home modifications that are needed for a safe d/c. Therapist requesting measurements of patients bed height and the car he will d/c home in, therapist also recommending a w/c ramp. Pt's son reports he is working on a ramp however it will not be done in time and they plan on bumping him up the steps. Pt performed incline slideboard transfer to the R from chair>mat with mod assist and cues for techniques.Pt transferred to supine on wedge with LEs hanging off edge of mat while therapist performed thomas hip flexor stretching 2 x 1 min to R LE. Therapist performed R hamstring and hip adductor stretching 2 x 1 min each. Pt transferred back to sitting with mod assist.Pt performed sit<>stand x2 mod assist with L UE over therapists shoulder x 1 with mod assist+2 using 3 musketeers techniques , in standing worked on increased hip extension with therapist providing manual facilitation. Pt worked on lateral scooting this session in each direction on the mat with min assist and cues for techniques, cues for hip clearance and manual facilitation for R LE placement. Slideboard transfer to w/c min assist and increased time, cues for head hips relationship. Pt transported back to room and transferred to  bed with slideboard min assist. Sit>supine mod assist, left supine with needs in reach and chair alarm set.    Therapy Documentation Precautions:  Precautions Precautions: Fall Precaution Comments: Expressive aphasia, R hemiparesis Restrictions Weight Bearing Restrictions: No   Therapy/Group: Individual Therapy  Netta Corrigan, PT, DPT 05/10/2019, 7:42 AM

## 2019-05-11 ENCOUNTER — Inpatient Hospital Stay (HOSPITAL_COMMUNITY): Payer: Medicare HMO | Admitting: Occupational Therapy

## 2019-05-11 ENCOUNTER — Inpatient Hospital Stay (HOSPITAL_COMMUNITY): Payer: Medicare HMO | Admitting: Speech Pathology

## 2019-05-11 ENCOUNTER — Inpatient Hospital Stay (HOSPITAL_COMMUNITY): Payer: Medicare HMO | Admitting: Physical Therapy

## 2019-05-11 LAB — CUP PACEART REMOTE DEVICE CHECK
Date Time Interrogation Session: 20200520173933
Implantable Pulse Generator Implant Date: 20200417

## 2019-05-11 MED ORDER — TIZANIDINE HCL 2 MG PO TABS
2.0000 mg | ORAL_TABLET | Freq: Three times a day (TID) | ORAL | Status: DC
  Administered 2019-05-11 – 2019-05-15 (×11): 2 mg via ORAL
  Filled 2019-05-11 (×10): qty 1

## 2019-05-11 MED ORDER — DOCUSATE SODIUM 100 MG PO CAPS
100.0000 mg | ORAL_CAPSULE | Freq: Two times a day (BID) | ORAL | Status: DC
  Filled 2019-05-11: qty 1

## 2019-05-11 NOTE — Progress Notes (Signed)
[facility] PHYSICAL MEDICINE & REHABILITATION PROGRESS NOTE   Subjective/Complaints: Small incont BM this am.  Per RN pt refuses all per rectum meds. Needs oral meds crushed .  ASA EC cannot be crushed, discussed changing to non enteric coated, refusing liquid colace Severe aphasia, Also with severe apraxia CHronic foley  ROS: Difficult to assess accuracy secondary to his communication issues   Objective:   No results found. No results for input(s): WBC, HGB, HCT, PLT in the last 72 hours. No results for input(s): NA, K, CL, CO2, GLUCOSE, BUN, CREATININE, CALCIUM in the last 72 hours.  Intake/Output Summary (Last 24 hours) at 05/11/2019 1233 Last data filed at 05/11/2019 0900 Gross per 24 hour  Intake 662 ml  Output 700 ml  Net -38 ml     Physical Exam: Vital Signs Blood pressure 94/63, pulse 97, temperature 98.3 F (36.8 C), resp. rate 16, height 5\' 4"  (1.626 m), weight 80 kg, SpO2 99 %. Constitutional: No distress . Vital signs reviewed. HEENT: EOMI, oral membranes moist Neck: supple Cardiovascular: RRR without murmur. No JVD    Respiratory: CTA Bilaterally without wheezes or rales. Normal effort    GI: BS +, non-tender, non-distended . Musc: No edema or tenderness in extremities. Neurologic: Alert Motor: LLE: 5/5 proximal distal Right 3- HF, KE, 2- RIgth t delt, bi, tri, tr ankle TOne  MAS 2 Hip Add , Elbow flex, knee flex Expressive aphasia, nods y/n  Assessment/Plan: 1. 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  Physiatrist is providing close team supervision and 24 hour management of active medical problems listed below.  Physiatrist and rehab team continue to assess barriers to discharge/monitor patient progress toward functional and medical goals  Care Tool:  Bathing    Body parts bathed by patient: Right arm,  Chest, Abdomen, Front perineal area, Left upper leg, Right upper leg, Face, Right lower leg, Left lower leg   Body parts bathed by helper: Left arm, Buttocks Body parts n/a: Right lower leg, Left lower leg   Bathing assist Assist Level: Moderate Assistance - Patient 50 - 74%     Upper Body Dressing/Undressing Upper body dressing   What is the patient wearing?: Pull over shirt    Upper body assist Assist Level: Moderate Assistance - Patient 50 - 74%    Lower Body Dressing/Undressing Lower body dressing      What is the patient wearing?: Pants     Lower body assist Assist for lower body dressing: Moderate Assistance - Patient 50 - 74%     Toileting Toileting    Toileting assist Assist for toileting: Maximal Assistance - Patient 25 - 49%     Transfers Chair/bed transfer  Transfers assist     Chair/bed transfer assist level: Minimal Assistance - Patient > 75%(slideboard)     Locomotion Ambulation   Ambulation assist   Ambulation activity did not occur: Safety/medical concerns  Assist level: 2 helpers Assistive device: Other (comment)(3 muskateer) Max distance: 5 ft   Walk 10 feet activity   Assist  Walk 10 feet activity did not occur: Safety/medical concerns  Assist level: 2 helpers Assistive device: Other (comment)(3 muskateer)   Walk 50 feet activity   Assist Walk 50 feet with 2 turns activity did not occur: Safety/medical concerns         Walk 150 feet activity   Assist Walk 150 feet activity did  not occur: Safety/medical concerns         Walk 10 feet on uneven surface  activity   Assist Walk 10 feet on uneven surfaces activity did not occur: Safety/medical concerns         Wheelchair     Assist Will patient use wheelchair at discharge?: Yes Type of Wheelchair: Manual    Wheelchair assist level: Dependent - Patient 0% Max wheelchair distance: 150 ft    Wheelchair 50 feet with 2 turns activity    Assist         Assist Level: Dependent - Patient 0%   Wheelchair 150 feet activity     Assist     Assist Level: Dependent - Patient 0%    Medical Problem List and Plan: 1.Right hemiparesis and aphasia/dysphagiasecondary to left insular and posterior frontal infarct embolic secondary to non-STEMI. Status post loop recorder  Continue CIR PT, OT, SLP, limited by aphasia and apraxia. 2. Antithrombotics: -DVT/anticoagulation:Subcutaneous heparin. -antiplatelet therapy: Aspirin 325 mg daily change to non enteric coated, Plavix 75 mg daily 3. Pain Management:Tylenol as needed 4. Mood:Provide emotional support -antipsychotic agents: N/A 5. Neuropsych: This patientisnot fully capable of making decisions on hisown behalf. 6. Skin/Wound Care:Routine skin checks 7. Fluids/Electrolytes/Nutrition:Routine in and outs  Hyperkalemia, improved on [place], this has now been discontinued and potassium continues to fall, nephrology has signed off 8.Dysphagia. Dysphagia #1 honey thick liquids. improving  Advanced to D2 thins, continue to advance as tolerated   Monitor for aspiration    Off IVF currently 9.Acute on chronic kidney disease stage III/hyperkalemia with obstructive uropathy/BPH. Follow-up urology services as well as nephrology services. -FOLEY TUBE TO REMAIN IN PLACE UNTIL FOLLOW UP OUT PATIENT WITH UROLOGY  Creatinine 5.14 on 5/3 to 4.11 on 5/7 Diuretics being held by nephro  Down to 3.85 on 5/11, BUN elevated , enc fluid 10.Hyperlipidemia. Lipitor 11.Hypertension. Lopressor 25 mg twice daily. Monitor with increased mobility Vitals:   05/10/19 1928 05/11/19 0533  BP: (!) 123/57 94/63  Pulse: 98 97  Resp: 18 16  Temp: 97.9 F (36.6 C) 98.3 F (36.8 C)  SpO2: 100% 99%   Controlled 5/21 12.OSA. Continue CPAP 13.  Acute blood loss anemia  Hemoglobin 9.5 on 4/29, 10.2 on 5/5  Continue to monitor 14.  Constipation cahnge colace  to pill form 15.  Severe spasticity , s/p dysport injection 5/14 to R biceps, Hamstrings, and add longus- expect improvement this week Improving   23 days A FACE TO FACE EVALUATION WAS PERFORMED  Erick Colacendrew E Rasheida Broden 05/11/2019, 12:33 PM

## 2019-05-11 NOTE — Progress Notes (Signed)
Social Work Patient ID: Gregory [name], male   DOB: August 27, 1951, 68 y.o.   MRN: [id number] Spoke with Annie-ex-wife and left message for [place] their son to discuss team conference progress in his therapies and the need for education to see if they can provide the care pt will require at discharge. Have scheduled family education Monday @ 10:00 with ex-wife and son. Ex-wife feels since she was a CNA she can manage him but will need to see/demonstate this, she is older and pt is a lot of care. Will see Monday am.

## 2019-05-11 NOTE — Progress Notes (Signed)
Occupational Therapy Weekly Progress Note  Patient Details  Name: Gregory [name] MRN: [id number] Date of Birth: 1951-06-10  Beginning of progress report period: May 04, 2019 End of progress report period: May 11, 2019  Today's Date: 05/11/2019 OT Individual Time: 7829-5621 OT Individual Time Calculation (min): 60 min    Patient has met 1 of 4 short term goals.  Pt continues to make slower than expected progress with OT at this time.  UB bathing is at min assist level with UB dressing at overall mod assist.  He needs mod assist for LB bathing sit to stand when there is a stable object to hold onto with the LUE.  He completed stand pivot transfers to the tub bench and the 3:1 with max assist.  Mod assist for sliding board transfers to the wheelchair.  Total assist +2 (pt 30%) for toilet hygiene in sit to stand position.  [name] continues to demonstrate decreased RUE functional use with movement at [facility] stage III movement in the arm and only stage II in the hand.  He needs max hand over hand assist to integrate the RUE into selfcare tasks such as bathing or dressing.  Motor planning deficits also continue to be exhibited, especially when attempting to remove tops from containers such as the soap or deodorant as well when attempting to apply soap on the washcloth.  Feel he will need extensive mod assist or greater for selfcare tasks at discharge.  Family is planning to come in on Monday for observation and education in preparation for current scheduled discharge on 5/27,  Will continue with current OT at CIR level.    Patient continues to demonstrate the following deficits: muscle weakness, impaired timing and sequencing, abnormal tone, unbalanced muscle activation, motor apraxia, decreased coordination and decreased motor planning, decreased motor planning, decreased problem solving, decreased memory and delayed processing and decreased sitting balance, decreased standing balance, decreased  postural control, hemiplegia and decreased balance strategies and therefore will continue to benefit from skilled OT intervention to enhance overall performance with BADL.  Patient not progressing toward long term goals.  See goal revision..  Continue plan of care.  OT Short Term Goals Week 4:  OT Short Term Goal 1 (Week 4): Continue working on established LTGs set mod to max assist overall.   Skilled Therapeutic Interventions/Progress Updates:    Pt transferred from supine to sit with mod assist to start session.  He was able to complete transfer to the wheelchair with mod assist scoot pivot with use of the sliding board.  He gestured that he needed to complete toilet transfer so therapist took him into the bathroom where he completed stand pivot transfer to the 3:1 over the toilet.  Total assist +2 (pt 30%) for toilet hygiene and clothing management with pt demonstrating bowel incontinence in his brief before reaching the toilet.  Once completed, he transferred back to the wheelchair and moved to the sink for clean up and dressing.  Min assist for UB bathing with mod assist to donn a pullover scrub top.  Max assist for all LB dressing secondary to time, with pt completing sit to stand with mod assist.  He still maintains flexed position in standing at the trunk and RLE.  Finished session with pt in the wheelchair with call button and phone in reach with safety alarm belt in place.     Therapy Documentation Precautions:  Precautions Precautions: Fall Precaution Comments: Expressive aphasia, R hemiparesis Restrictions Weight Bearing Restrictions: No  Pain: Pain Assessment Pain Scale: Faces Pain Score: 0-No pain ADL: See Care Tool Section for some details of ADL  Therapy/Group: Individual Therapy  Emmersen Garraway OTR/L 05/11/2019, 3:41 PM

## 2019-05-11 NOTE — Progress Notes (Signed)
Pt reporting severe pain in right ankle area. Md aware and prafo boot ordered. Pt requesting additional pain medication due to increase in intensity. Md called, awaiting new orders at this time.

## 2019-05-11 NOTE — Progress Notes (Signed)
Orthopedic Tech Progress Note Patient Details:  Gregory [name] Oct 26, 1951 [id number]  Patient ID: Gregory [name], male   DOB: July 20, 1951, 68 y.o.   MRN: [id number]   Gregory [name] 05/11/2019, 4:21 PMCalled [employer] for right Prafo boot.

## 2019-05-11 NOTE — Progress Notes (Signed)
Patient continue to refuse stool softener or po laxative, Last recorded BM 05/05/19, po encouraged,assess abdomen and noted right lower quad with some bruising, warmth to touxh, and tenderness, Nod with response to some discomfort, monitor

## 2019-05-11 NOTE — Progress Notes (Signed)
Physical Therapy Session Note  Patient Details  Name: Gregory [name] MRN: [id number] Date of Birth: February 01, 1951  Today's Date: 05/11/2019 PT Individual Time: 6063-0160 PT Individual Time Calculation (min): 30 min   and  Today's Date: 05/11/2019 PT Missed Time: 15 Minutes Missed Time Reason: Pain  Short Term Goals: Week 4:  PT Short Term Goal 1 (Week 4): STG=LTG due to ELOS  Skilled Therapeutic Interventions/Progress Updates:    Missed 15 minutes of skilled therapy due to pain. Pt received supine in bed and reporting 10/10 pain in L LE around his Achille's with pt reporting pain to the touch and with movement of his ankle - RN notified and present for medication administration as well as reporting she will follow-up with MD regarding boot for R LE. Pt requesting to brush teeth with therapist assisting with set-up of suction foam tooth brush and providing supervision for safety. Performed R UE PROM in all planes with focus on elbow extension, shoulder flexion, and shoulder external rotation for tone and pain management as well as to maintain ROM for increased use during functional mobility - continues to lack final degrees of elbow extension and shoulder flexion. Performed gentle PROM on R LE focusing on hip/knee flexion/extension for pain management and working towards gentle ankle DF/PF PROM with pt denying increase in pain and tolerating increased ankle ROM with repetitions. Pt deferred sitting EOB or getting OOB and requesting to rest due pain levels. Pt left supine in bed with needs in reach, bed alarm on, and positioning for pain management.  Therapy Documentation Precautions:  Precautions Precautions: Fall Precaution Comments: Expressive aphasia, R hemiparesis Restrictions Weight Bearing Restrictions: No  Pain: Pain Assessment Pain Scale: 0-10 Pain Score: 10-Worst pain ever Pain Location: Leg Pain Orientation: Posterior;Distal Pain Onset: On-going(constant but increases with  touch and movement) Pain Intervention(s): RN made aware;Medication (See eMAR);Rest;Repositioned;Other (Comment)(gentle PROM)   Therapy/Group: Individual Therapy  Ginny Forth, PT, DPT 05/11/2019, 11:37 AM

## 2019-05-11 NOTE — Progress Notes (Signed)
Speech Language Pathology Daily Session Note  Patient Details  Name: Gregory [name] MRN: [id number] Date of Birth: 12-Aug-1951  Today's Date: 05/11/2019 SLP Individual Time: 1400-1500 SLP Individual Time Calculation (min): 60 min  Short Term Goals: Week 4: SLP STG=LTG due to short ELOS  Skilled Therapeutic Interventions: Pt was seen for skilled ST intervention targeting goals for improved communication. SLP facilitated session by providing written phrases [place]) to match to functional object. Pt was 80% accurate with this task. Pt had difficulty spelling 2-3 letter words on ABC board (1/8, 13%). During reading/matching task, pt was able to name functional objects with 75% given lead-in phrase. He was able to name ball, hammer, and pen independently. Pt produced /p/ + vowel phonemes with 100% accuracy today, and demonstrated improved ability to produce /b/ + vowel today. Pt was tired at end of session, and indicated he wanted to get back in bed. Pt was left in chair with RN and NT present, all needs within reach. Continue ST per current plan of care.  Pain Pain Assessment Pain Scale: Faces Pain Score: 0-No pain Faces Pain Scale: Hurts even more(at end of session) Pain Type: Acute pain Pain Location: Back Pain Orientation: Lower Pain Descriptors / Indicators: Discomfort Pain Intervention(s): RN made aware  Therapy/Group: Individual Therapy   Celia B. Murvin Natal, [facility], CCC-SLP Speech Language Pathologist  Leigh Aurora 05/11/2019, 4:01 PM

## 2019-05-12 ENCOUNTER — Inpatient Hospital Stay (HOSPITAL_COMMUNITY): Payer: Medicare HMO | Admitting: Occupational Therapy

## 2019-05-12 ENCOUNTER — Inpatient Hospital Stay (HOSPITAL_COMMUNITY): Payer: Medicare HMO | Admitting: Speech Pathology

## 2019-05-12 ENCOUNTER — Inpatient Hospital Stay (HOSPITAL_COMMUNITY): Payer: Medicare HMO | Admitting: Physical Therapy

## 2019-05-12 NOTE — Progress Notes (Signed)
Occupational Therapy Session Note  Patient Details  Name: Gregory [name] MRN: [id number] Date of Birth: 07-11-1951  Today's Date: 05/12/2019 OT Individual Time: 3664-4034 OT Individual Time Calculation (min): 61 min    Short Term Goals: Week 4:  OT Short Term Goal 1 (Week 4): Continue working on established LTGs set mod to max assist overall.   Skilled Therapeutic Interventions/Progress Updates:    Pt completed shower and dressing during session.  Max assist for stand pivot transfer wheelchair to tub bench and back.  Max facilitation for advancing the RLE during each transfer.  Min assist for UB bathing with max hand over hand to integrate the RUE to wash the left arm.  Mod assist for sit to stand with use of a grab bar on the left side for LB bathing.  LH sponge incorporated for washing the lower legs bilaterally as well.  Next pt transferred out to the sink in the wheelchair for dressing.  Min assist for UB dressing with min instructional cueing for hemi dressing technique.  Max assist for LB dressing sit to stand.  Finished session with stand pivot transfer back to the bed with max assist to the left.  Mod facilitation to transition from sitting to supine.  Pt left in the bed with call button and phone in reach and bed alarm in place.  Pt with increased right heel pain throughout session with weightbearing or touching of the heel when doffing and donning socks.  PRAFO donned once pt was back in the bed.    Therapy Documentation Precautions:  Precautions Precautions: Fall Precaution Comments: Expressive aphasia, R hemiparesis Restrictions Weight Bearing Restrictions: No  Pain: Pain Assessment Pain Scale: Faces Faces Pain Scale: Hurts even more Pain Type: Acute pain Pain Location: Foot Pain Orientation: Right Pain Descriptors / Indicators: Discomfort;Grimacing Pain Onset: With Activity Pain Intervention(s): Repositioned ADL: See Care Tool Section for some details of  ADL  Therapy/Group: Individual Therapy  Tedd Cottrill OTR/L 05/12/2019, 2:45 PM

## 2019-05-12 NOTE — Plan of Care (Signed)
  Problem: Consults Goal: RH STROKE PATIENT EDUCATION Description See Patient Education module for education specifics  Outcome: Progressing   Problem: RH BOWEL ELIMINATION Goal: RH STG MANAGE BOWEL WITH ASSISTANCE Description STG Manage Bowel with Mod Assistance.  Outcome: Progressing   Problem: RH BLADDER ELIMINATION Goal: RH STG MANAGE BLADDER WITH EQUIPMENT WITH ASSISTANCE Description STG Manage Bladder With Equipment With Max Assistance  Outcome: Progressing   Problem: RH SKIN INTEGRITY Goal: RH STG SKIN FREE OF INFECTION/BREAKDOWN Description No new breakdown with min assist   Outcome: Progressing   Problem: RH SAFETY Goal: RH STG ADHERE TO SAFETY PRECAUTIONS W/ASSISTANCE/DEVICE Description STG Adhere to Safety Precautions With Min Assistance/Device.  Outcome: Progressing   Problem: RH COGNITION-NURSING Goal: RH STG USES MEMORY AIDS/STRATEGIES W/ASSIST TO PROBLEM SOLVE Description STG Uses Memory Aids/Strategies With Min Assistance to Problem Solve.  Outcome: Progressing   Problem: RH PAIN MANAGEMENT Goal: RH STG PAIN MANAGED AT OR BELOW PT'S PAIN GOAL Description < 3 out of 10.    Outcome: Progressing   

## 2019-05-12 NOTE — Progress Notes (Signed)
Speech Language Pathology Daily Session Note  Patient Details  Name: Gregory [name] MRN: [id number] Date of Birth: Aug 20, 1951  Today's Date: 05/12/2019 SLP Individual Time:  -     Short Term Goals: Week 3: SLP Short Term Goal 1 (Week 3): Pt will tolerate Dys3 diet and thin liquids without overt s/s aspiration given min A verbal and visual cues for use of safe swallow strategies. SLP Short Term Goal 1 - Progress (Week 3): Met SLP Short Term Goal 2 (Week 3): Pt will produce 3-5 vowel sounds and or C-V combinations with 75% accuracy given mod verbal/visual cues. SLP Short Term Goal 2 - Progress (Week 3): Met SLP Short Term Goal 3 (Week 3): Pt will demonstrate comprehension of functional written information with 80% accuracy given min cues SLP Short Term Goal 3 - Progress (Week 3): Met SLP Short Term Goal 4 (Week 3): Pt will demonstrate ability to spell functional words (via pen and paper or communication board) with 80% accuracy given mod cues SLP Short Term Goal 4 - Progress (Week 3): Met  Skilled Therapeutic Interventions: Pt was seen for skilled ST intervention targeting goals for effective communication and advanced diet tolerance. SLP facilitated session by providing minimal pairs of bilabial CV syllables (b, p). Pt exhibits improving ability to switch between 2 phonemes, however, success is highly inconsistent. Pt was 90% accurate with 7 /p/ CV syllables given min verbal cues. 38% accurate with /b/ CVC words, 50% accurate with /m/ CVC words. Pt was not stimulable for /t/ CV syllables today. Pt lunch tray arrived (regular consistency). Pt required set up, then demonstrated tolerance of regular textures and thin liquids with min cues for right oral residue. Will advance diet to regular/thin at this time. Safe swallow precautions posted at [facility], and dietary restrictions clarified with RN/PA. Pt was left in bed with alarm on, all needs within reach. Continued ST intervention is recommended per  current plan of care.   Pain Pain Assessment Pain Scale: Faces Faces Pain Scale: No hurt Pain Type: Acute pain Pain Location: Foot Pain Orientation: Right Pain Descriptors / Indicators: Discomfort;Grimacing Pain Onset: With Activity Pain Intervention(s): Repositioned  Therapy/Group: Individual Therapy   Madolyn Ackroyd B. Quentin Ore, [facility], [place] Speech Language Pathologist  Shonna Chock 05/12/2019, 3:11 PM

## 2019-05-12 NOTE — Progress Notes (Addendum)
Physical Therapy Session Note  Patient Details  Name: Gregory [name] MRN: [id number] Date of Birth: 1951-03-28  Today's Date: 05/12/2019 PT Individual Time: 1050-1148 PT Individual Time Calculation (min): 58 min   Short Term Goals: Week 4:  PT Short Term Goal 1 (Week 4): STG=LTG due to ELOS  Skilled Therapeutic Interventions/Progress Updates:    Pt received supine in bed and reporting overall not feeling well and continues to report 10/10 R Achille's pain but agreeable to therapy session. Pt declines medication management of pain and request therapist to perform ROM. Therapist donned B LE TED hose and non-slip socks total assist for time management. Therapist assessed vitals in L UE while supine to be: 104/56 (MAP 71) HR 72bpm with RN present and aware. Supine>sit, HOB partially elevated and using bedrails, with min assist for trunk upright. Reassessed vitals in sitting: 93/63 (MAP 74) HR 103bpm with pt reporting lightheadedness. R lateral scoot transfer EOB>w/c using transfer board with max assist for board placement and trunk control and mod assist for scooting. Vitals reassessed sitting in w/c: 105/52 (MAP 66) HR 75bpm. Transported to/from gym in w/c. R lateral scoot transfer w/c>EOM with max assist for board placement and mod assist for scooting hips and trunk control. Supine<>sit with min/mod assist for B LE management and trunk control.  Therapist performed R LE PROM starting with hip/knee flexion/extension and progressed to gentle ankle DF/PF to pt's pain tolerance with pt demonstrating gradual increase in ROM tolerated - pt continues to report increased R ankle pain upon onset of LE flexor tone. Performed sit<>stand EOM<>RW with min assist for lifting/lowering and balance with therapist providing manual facilitation for R LE placement. Pt tolerated standing ~45seconds each with cuing and manual facilitation for increased trunk and hip extension. L lateral scoot transfer EOM>w/c with max assist  for transfer board placement and min assist for scooting hips and trunk control. Transported back to room and pt educated on importance of increasing OOB tolerance during the day and left sitting in w/c with needs in reach and seat belt alarm on.   Addendum: LTGs downgraded based on patient's progress.   Therapy Documentation Precautions:  Precautions Precautions: Fall Precaution Comments: Expressive aphasia, R hemiparesis Restrictions Weight Bearing Restrictions: No  Vital Signs: Therapy Vitals Pulse Rate: 75 BP: (!) 105/52 Patient Position (if appropriate): Sitting   Pain: Reports 10/10 pain in R Achille's region - pt decline medication intervention and requested PROM which therapist performed for pain management.   Therapy/Group: Individual Therapy  Ginny Forth, PT, DPT 05/12/2019, 7:56 AM

## 2019-05-12 NOTE — Progress Notes (Signed)
[place] PHYSICAL MEDICINE & REHABILITATION PROGRESS NOTE   Subjective/Complaints: Right ankle pain, no falls , slept ok, started tizanidien yesterday Orthotist here with PRAFO  Severe aphasia, Also with severe apraxia CHronic foley  ROS: Difficult to assess accuracy secondary to his communication issues   Objective:   No results found. No results for input(s): WBC, HGB, HCT, PLT in the last 72 hours. No results for input(s): NA, K, CL, CO2, GLUCOSE, BUN, CREATININE, CALCIUM in the last 72 hours.  Intake/Output Summary (Last 24 hours) at 05/12/2019 0908 Last data filed at 05/12/2019 0533 Gross per 24 hour  Intake 0 ml  Output 775 ml  Net -775 ml     Physical Exam: Vital Signs Blood pressure 113/62, pulse (!) 107, temperature 97.6 F (36.4 C), resp. rate 18, height 5\' 4"  (1.626 m), weight 80 kg, SpO2 98 %. Constitutional: No distress . Vital signs reviewed. HEENT: EOMI, oral membranes moist Neck: supple Cardiovascular: RRR without murmur. No JVD    Respiratory: CTA Bilaterally without wheezes or rales. Normal effort    GI: BS +, non-tender, non-distended . Musc: No edema or tenderness in extremities. Neurologic: Alert Motor: LLE: 5/5 proximal distal Right 3- HF, KE, 2- RIgth t delt, bi, tri, tr ankle TOne  MAS 2 Hip Add , Elbow flex, knee flex Expressive aphasia, nods y/n  Assessment/Plan: 1. 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  Physiatrist is providing close team supervision and 24 hour management of active medical problems listed below.  Physiatrist and rehab team continue to assess barriers to discharge/monitor patient progress toward functional and medical goals  Care Tool:  Bathing    Body parts bathed by patient: Right arm, Chest, Abdomen, Front perineal area, Left upper leg, Right upper leg, Face   Body  parts bathed by helper: Buttocks, Left arm Body parts n/a: Right lower leg, Left lower leg   Bathing assist Assist Level: Moderate Assistance - Patient 50 - 74%     Upper Body Dressing/Undressing Upper body dressing   What is the patient wearing?: Pull over shirt    Upper body assist Assist Level: Moderate Assistance - Patient 50 - 74%    Lower Body Dressing/Undressing Lower body dressing      What is the patient wearing?: Pants     Lower body assist Assist for lower body dressing: Maximal Assistance - Patient 25 - 49%(max assist secondary to decreased time)     Toileting Toileting    Toileting assist Assist for toileting: 2 Helpers     Transfers Chair/bed transfer  Transfers assist     Chair/bed transfer assist level: Moderate Assistance - Patient 50 - 74%(sliding board)     Locomotion Ambulation   Ambulation assist   Ambulation activity did not occur: Safety/medical concerns  Assist level: 2 helpers Assistive device: Other (comment)(3 muskateer) Max distance: 5 ft   Walk 10 feet activity   Assist  Walk 10 feet activity did not occur: Safety/medical concerns  Assist level: 2 helpers Assistive device: Other (comment)(3 muskateer)   Walk 50 feet activity   Assist Walk 50 feet with 2 turns activity did not occur: Safety/medical concerns         Walk 150 feet activity   Assist Walk 150 feet activity did not occur: Safety/medical concerns         Walk 10 feet on uneven surface  activity  Assist Walk 10 feet on uneven surfaces activity did not occur: Safety/medical concerns         Wheelchair     Assist Will patient use wheelchair at discharge?: Yes Type of Wheelchair: Manual    Wheelchair assist level: Dependent - Patient 0% Max wheelchair distance: 150 ft    Wheelchair 50 feet with 2 turns activity    Assist        Assist Level: Dependent - Patient 0%   Wheelchair 150 feet activity     Assist      Assist Level: Dependent - Patient 0%    Medical Problem List and Plan: 1.Right hemiparesis and aphasia/dysphagiasecondary to left insular and posterior frontal infarct embolic secondary to non-STEMI. Status post loop recorder  Continue CIR PT, OT, SLP, limited by aphasia and apraxia. 2. Antithrombotics: -DVT/anticoagulation:Subcutaneous heparin. -antiplatelet therapy: Aspirin 325 mg daily change to non enteric coated, Plavix 75 mg daily 3. Pain Management:Tylenol as needed 4. Mood:Provide emotional support -antipsychotic agents: N/A 5. Neuropsych: This patientisnot fully capable of making decisions on hisown behalf. 6. Skin/Wound Care:Routine skin checks 7. Fluids/Electrolytes/Nutrition:Routine in and outs  Hyperkalemia, improved on Veltassa, this has now been discontinued and potassium continues to fall, nephrology has signed off 8.Dysphagia. Dysphagia #1 honey thick liquids. improving  Advanced to D2 thins, continue to advance as tolerated   Monitor for aspiration    Off IVF currently 9.Acute on chronic kidney disease stage III/hyperkalemia with obstructive uropathy/BPH. Follow-up urology services as well as nephrology services. -FOLEY TUBE TO REMAIN IN PLACE UNTIL FOLLOW UP OUT PATIENT WITH UROLOGY  Creatinine 5.14 on 5/3 to 4.11 on 5/7 Diuretics being held by nephro  Down to 3.85 on 5/11, BUN elevated , enc fluid 10.Hyperlipidemia. Lipitor 11.Hypertension. Lopressor 25 mg twice daily. Monitor with increased mobility Vitals:   05/11/19 1947 05/12/19 0534  BP: (!) 113/54 113/62  Pulse: 91 (!) 107  Resp: 18 18  Temp: 98.6 F (37 C) 97.6 F (36.4 C)  SpO2:  98%   Controlled 5/22 12.OSA. Continue CPAP 13.  Acute blood loss anemia  Hemoglobin 9.5 on 4/29, 10.2 on 5/5  Continue to monitor 14.  Constipation cahnge colace to pill form 15.  Severe spasticity , s/p dysport injection 5/14 to R biceps, Hamstrings,  and add longus- expect improvement this week Improving , added tizanidine   24 days A FACE TO FACE EVALUATION WAS PERFORMED  Erick Colacendrew E Wenceslaus Gist 05/12/2019, 9:08 AM

## 2019-05-13 ENCOUNTER — Inpatient Hospital Stay (HOSPITAL_COMMUNITY): Payer: Medicare HMO | Admitting: Speech Pathology

## 2019-05-13 MED ORDER — DOCUSATE SODIUM 50 MG/5ML PO LIQD
100.0000 mg | Freq: Two times a day (BID) | ORAL | Status: DC
  Administered 2019-05-14: 100 mg via ORAL
  Filled 2019-05-13 (×6): qty 10

## 2019-05-13 NOTE — Progress Notes (Signed)
[place] PHYSICAL MEDICINE & REHABILITATION PROGRESS NOTE   Subjective/Complaints: Patient appears comfortable, still with severe aphasia, ROS: Difficult to assess accuracy secondary to his communication issues   Objective:   No results found. No results for input(s): WBC, HGB, HCT, PLT in the last 72 hours. No results for input(s): NA, K, CL, CO2, GLUCOSE, BUN, CREATININE, CALCIUM in the last 72 hours.  Intake/Output Summary (Last 24 hours) at 05/13/2019 1122 Last data filed at 05/13/2019 0547 Gross per 24 hour  Intake 240 ml  Output 600 ml  Net -360 ml     Physical Exam: Vital Signs Blood pressure 135/73, pulse 91, temperature 98 F (36.7 C), temperature source Oral, resp. rate 20, height 5\' 4"  (1.626 m), weight 80 kg, SpO2 100 %. Constitutional: No distress . Vital signs reviewed. HEENT: EOMI, oral membranes moist Neck: supple Cardiovascular: RRR without murmur. No JVD    Respiratory: CTA Bilaterally without wheezes or rales. Normal effort    GI: BS +, non-tender, non-distended . Musc: No edema or tenderness in extremities. Neurologic: Alert Motor: LLE: 5/5 proximal distal Right 3- HF, KE, 2- RIgth t delt, bi, tri, tr ankle TOne  MAS 2 Hip Add , Elbow flex, knee flex Expressive aphasia, nods y/n  Assessment/Plan: 1. 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  Physiatrist is providing close team supervision and 24 hour management of active medical problems listed below.  Physiatrist and rehab team continue to assess barriers to discharge/monitor patient progress toward functional and medical goals  Care Tool:  Bathing    Body parts bathed by patient: Right arm, Chest, Abdomen, Front perineal area, Left upper leg, Right upper leg, Face, Right lower leg, Left lower leg   Body parts bathed by helper: Buttocks, Left  arm Body parts n/a: Right lower leg, Left lower leg   Bathing assist Assist Level: Moderate Assistance - Patient 50 - 74%     Upper Body Dressing/Undressing Upper body dressing   What is the patient wearing?: Pull over shirt    Upper body assist Assist Level: Minimal Assistance - Patient > 75%    Lower Body Dressing/Undressing Lower body dressing      What is the patient wearing?: Pants     Lower body assist Assist for lower body dressing: Maximal Assistance - Patient 25 - 49%     Toileting Toileting    Toileting assist Assist for toileting: 2 Helpers     Transfers Chair/bed transfer  Transfers assist     Chair/bed transfer assist level: Maximal Assistance - Patient 25 - 49%     Locomotion Ambulation   Ambulation assist   Ambulation activity did not occur: Safety/medical concerns  Assist level: 2 helpers Assistive device: Other (comment)(3 Investment banker, corporatemuskateer) Max distance: 5 ft   Walk 10 feet activity   Assist  Walk 10 feet activity did not occur: Safety/medical concerns  Assist level: 2 helpers Assistive device: Other (comment)(3 muskateer)   Walk 50 feet activity   Assist Walk 50 feet with 2 turns activity did not occur: Safety/medical concerns         Walk 150 feet activity   Assist Walk 150 feet activity did not occur: Safety/medical concerns         Walk 10 feet on uneven surface  activity   Assist Walk 10 feet on uneven surfaces activity did not occur: Safety/medical concerns  Wheelchair     Assist Will patient use wheelchair at discharge?: Yes Type of Wheelchair: Manual    Wheelchair assist level: Dependent - Patient 0% Max wheelchair distance: 150 ft    Wheelchair 50 feet with 2 turns activity    Assist        Assist Level: Dependent - Patient 0%   Wheelchair 150 feet activity     Assist     Assist Level: Dependent - Patient 0%    Medical Problem List and Plan: 1.Right hemiparesis and  aphasia/dysphagiasecondary to left insular and posterior frontal infarct embolic secondary to non-STEMI. Status post loop recorder  Continue CIR PT, OT, SLP, limited by aphasia and apraxia. 2. Antithrombotics: -DVT/anticoagulation:Subcutaneous heparin. -antiplatelet therapy: Aspirin 325 mg daily change to non enteric coated, Plavix 75 mg daily 3. Pain Management:Tylenol as needed, right Achilles pain improved with PR AFO use 4. Mood:Provide emotional support -antipsychotic agents: N/A 5. Neuropsych: This patientisnot fully capable of making decisions on hisown behalf. 6. Skin/Wound Care:Routine skin checks 7. Fluids/Electrolytes/Nutrition:Routine in and outs  Hyperkalemia, improved on Veltassa, this has now been discontinued and potassium continues to fall, nephrology has signed off 8.Dysphagia. Dysphagia #1 honey thick liquids. improving  Advanced to D2 thins, continue to advance as tolerated   Monitor for aspiration    Off IVF currently 9.Acute on chronic kidney disease stage III/hyperkalemia with obstructive uropathy/BPH. Follow-up urology services as well as nephrology services. -FOLEY TUBE TO REMAIN IN PLACE UNTIL FOLLOW UP OUT PATIENT WITH UROLOGY  Creatinine 5.14 on 5/3 to 4.11 on 5/7 Diuretics being held by nephro  Down to 3.85 on 5/11, BUN elevated , enc fluid 10.Hyperlipidemia. Lipitor 11.Hypertension. Lopressor 25 mg twice daily. Monitor with increased mobility Vitals:   05/12/19 1937 05/13/19 0547  BP: 101/68 135/73  Pulse: 91 91  Resp: 18 20  Temp: 98 F (36.7 C) 98 F (36.7 C)  SpO2: 100% 100%   Controlled 5/23 12.OSA. Continue CPAP 13.  Acute blood loss anemia  Hemoglobin 9.5 on 4/29, 10.2 on 5/5  Continue to monitor 14.  Constipation cahnge colace to pill form 15.  Severe spasticity , s/p dysport injection 5/14 to R biceps, Hamstrings, and add longus- expect improvement this week Improving , added  tizanidine   25 days A FACE TO FACE EVALUATION WAS PERFORMED  Erick Colace 05/13/2019, 11:22 AM

## 2019-05-13 NOTE — Progress Notes (Signed)
Speech Language Pathology Daily Session Note  Patient Details  Name: Gregory [name] MRN: [id number] Date of Birth: 1951/04/02  Today's Date: 05/13/2019 SLP Individual Time: 1100-1200 SLP Individual Time Calculation (min): 60 min  Short Term Goals: Week 4: SLP Short Term Goal 1 (Week 4): STG=LTG due to short ELOS  Skilled Therapeutic Interventions:  Skilled treatment session focused on dysphagia and communication. SLP facilitated session by providing skilled observation of pt consuming regular texture snack with thin liquids. Pt with good oral clearing and no overt s/s of aspiration with thin liquids. Additionally, SLP facilitated session by providing multimodal cues to produce CV syllables. Pt able to produce  /p/ CV syllables given min verbal cues in 6 out of 10 opportunities and /b/ CV syllables given Mod A cues in 5 out of 10 opportunities. Pt left reclined in bed, bed alarm on and all needs within reach. Continue per current plan of care.      Pain    Therapy/Group: Individual Therapy  Lesleigh Hughson 05/13/2019, 1:42 PM

## 2019-05-14 MED ORDER — TRAMADOL HCL 50 MG PO TABS
50.0000 mg | ORAL_TABLET | Freq: Two times a day (BID) | ORAL | Status: DC | PRN
  Administered 2019-05-14 – 2019-05-16 (×2): 50 mg via ORAL
  Filled 2019-05-14 (×2): qty 1

## 2019-05-14 NOTE — Progress Notes (Signed)
Pt c/o increased pain to right ankle/leg. Pt grimacing and refusing to eat stating pain is so bad. Md notified and tramadol ordered. heat pack placed to area. Pt reporting no relief at this time.

## 2019-05-15 ENCOUNTER — Ambulatory Visit (HOSPITAL_COMMUNITY): Payer: Medicare HMO

## 2019-05-15 ENCOUNTER — Encounter (HOSPITAL_COMMUNITY): Payer: Medicare HMO

## 2019-05-15 ENCOUNTER — Inpatient Hospital Stay (HOSPITAL_COMMUNITY): Payer: Medicare HMO

## 2019-05-15 ENCOUNTER — Inpatient Hospital Stay (HOSPITAL_COMMUNITY): Payer: Medicare HMO | Admitting: Occupational Therapy

## 2019-05-15 DIAGNOSIS — G8110 Spastic hemiplegia affecting unspecified side: Secondary | ICD-10-CM

## 2019-05-15 MED ORDER — TIZANIDINE HCL 4 MG PO TABS
4.0000 mg | ORAL_TABLET | Freq: Three times a day (TID) | ORAL | Status: DC
  Administered 2019-05-15 – 2019-05-17 (×6): 4 mg via ORAL
  Filled 2019-05-15 (×6): qty 1

## 2019-05-15 NOTE — Plan of Care (Signed)
  Problem: Consults Goal: RH STROKE PATIENT EDUCATION Description See Patient Education module for education specifics  Outcome: Progressing   Problem: RH BOWEL ELIMINATION Goal: RH STG MANAGE BOWEL WITH ASSISTANCE Description STG Manage Bowel with Mod Assistance.  Outcome: Progressing   Problem: RH BLADDER ELIMINATION Goal: RH STG MANAGE BLADDER WITH EQUIPMENT WITH ASSISTANCE Description STG Manage Bladder With Equipment With Max Assistance  Outcome: Progressing   Problem: RH SKIN INTEGRITY Goal: RH STG SKIN FREE OF INFECTION/BREAKDOWN Description No new breakdown with min assist   Outcome: Progressing   Problem: RH SAFETY Goal: RH STG ADHERE TO SAFETY PRECAUTIONS W/ASSISTANCE/DEVICE Description STG Adhere to Safety Precautions With Min Assistance/Device.  Outcome: Progressing   Problem: RH COGNITION-NURSING Goal: RH STG USES MEMORY AIDS/STRATEGIES W/ASSIST TO PROBLEM SOLVE Description STG Uses Memory Aids/Strategies With Min Assistance to Problem Solve.  Outcome: Progressing   Problem: RH PAIN MANAGEMENT Goal: RH STG PAIN MANAGED AT OR BELOW PT'S PAIN GOAL Description < 3 out of 10.    Outcome: Progressing   

## 2019-05-15 NOTE — Progress Notes (Signed)
No bowel movment noted since 05/12/19. Stool softer offered patient refused. Kalman Shan, LPN

## 2019-05-15 NOTE — Progress Notes (Signed)
  Diagnosis code:R41.844, I69.391 & I63.9  Height:5'4  Weight:180   Patient has L-CVA and Dysphagia which requires his upper body to be positioned in ways not feasible with a normal bed.  Head must be elevated at least 30 % . His upper body requires frequent and immediate changes in body position which cannot be achieved with a normal bed.

## 2019-05-15 NOTE — Progress Notes (Signed)
[place] PHYSICAL MEDICINE & REHABILITATION PROGRESS NOTE   Subjective/Complaints: Patient seen sitting up in bed this AM.  He indicated he did not sleep well overnight or have a good weekend, because he appears to complain of right leg pain.   ROS: Difficult to assess accuracy secondary to his aphasia   Objective:   No results found. No results for input(s): WBC, HGB, HCT, PLT in the last 72 hours. No results for input(s): NA, K, CL, CO2, GLUCOSE, BUN, CREATININE, CALCIUM in the last 72 hours.  Intake/Output Summary (Last 24 hours) at 05/15/2019 1125 Last data filed at 05/15/2019 0500 Gross per 24 hour  Intake 222 ml  Output 700 ml  Net -478 ml     Physical Exam: Vital Signs Blood pressure (!) 146/80, pulse 69, temperature 98 F (36.7 C), temperature source Oral, resp. rate 16, height 5\' 4"  (1.626 m), weight 80 kg, SpO2 100 %. Constitutional: No distress . Vital signs reviewed. HENT: Normocephalic.  Atraumatic. Eyes: EOMI. No discharge. Cardiovascular: No JVD. Respiratory: Normal effort. GI: Non-distended. Musc: No edema or tenderness in extremities. Neurologic: Alert Motor:  Right 3-/5 HF, KE, 2-/5 Expressive aphasia, nods y/n  Assessment/Plan: 1. 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  Physiatrist is providing close team supervision and 24 hour management of active medical problems listed below.  Physiatrist and rehab team continue to assess barriers to discharge/monitor patient progress toward functional and medical goals  Care Tool:  Bathing    Body parts bathed by patient: Right arm, Chest, Abdomen, Front perineal area, Left upper leg, Right upper leg, Face, Right lower leg, Left lower leg   Body parts bathed by helper: Buttocks, Left arm Body parts n/a: Right lower leg, Left lower leg   Bathing assist Assist  Level: Moderate Assistance - Patient 50 - 74%     Upper Body Dressing/Undressing Upper body dressing   What is the patient wearing?: Pull over shirt    Upper body assist Assist Level: Minimal Assistance - Patient > 75%    Lower Body Dressing/Undressing Lower body dressing      What is the patient wearing?: Pants     Lower body assist Assist for lower body dressing: Maximal Assistance - Patient 25 - 49%     Toileting Toileting    Toileting assist Assist for toileting: 2 Helpers     Transfers Chair/bed transfer  Transfers assist     Chair/bed transfer assist level: Maximal Assistance - Patient 25 - 49%     Locomotion Ambulation   Ambulation assist   Ambulation activity did not occur: Safety/medical concerns  Assist level: 2 helpers Assistive device: Other (comment)(3 Investment banker, corporatemuskateer) Max distance: 5 ft   Walk 10 feet activity   Assist  Walk 10 feet activity did not occur: Safety/medical concerns  Assist level: 2 helpers Assistive device: Other (comment)(3 muskateer)   Walk 50 feet activity   Assist Walk 50 feet with 2 turns activity did not occur: Safety/medical concerns         Walk 150 feet activity   Assist Walk 150 feet activity did not occur: Safety/medical concerns         Walk 10 feet on uneven surface  activity   Assist Walk 10 feet on uneven surfaces activity did not occur: Safety/medical concerns         Wheelchair     Assist Will patient  use wheelchair at discharge?: Yes Type of Wheelchair: Manual    Wheelchair assist level: Dependent - Patient 0% Max wheelchair distance: 150 ft    Wheelchair 50 feet with 2 turns activity    Assist        Assist Level: Dependent - Patient 0%   Wheelchair 150 feet activity     Assist     Assist Level: Dependent - Patient 0%    Medical Problem List and Plan: 1.Right hemiparesis and aphasia/dysphagiasecondary to left insular and posterior frontal infarct embolic  secondary to non-STEMI. Status post loop recorder  Continue CIR   Weekend notes reviewed-stable 2. Antithrombotics: -DVT/anticoagulation:Subcutaneous heparin. -antiplatelet therapy: Aspirin 325 mg daily change to non enteric coated, Plavix 75 mg daily 3. Pain Management:Tylenol as needed, right Achilles pain improved with PR AFO use   Tizanidine increased to 4 mg 3 times daily on 5/25 4. Mood:Provide emotional support -antipsychotic agents: N/A 5. Neuropsych: This patientisnot fully capable of making decisions on hisown behalf. 6. Skin/Wound Care:Routine skin checks 7. Fluids/Electrolytes/Nutrition:Routine in and outs  Hyperkalemia, improved on V[place], this has now been discontinued and potassium continues to fall, nephrology has signed off 8.Dysphagia. Advanced to regular diet  Monitor for aspiration    Off IVF currently 9.Acute on chronic kidney disease stage III/hyperkalemia with obstructive uropathy/BPH. Follow-up urology services as well as nephrology services. -FOLEY TUBE TO REMAIN IN PLACE UNTIL FOLLOW UP OUT PATIENT WITH UROLOGY  Creatinine 4.13 on 5/16, labs ordered for tomorrow 10.Hyperlipidemia. Lipitor 11.Hypertension. Lopressor 25 mg twice daily. Monitor with increased mobility Vitals:   05/14/19 2007 05/15/19 0439  BP: 132/65 (!) 146/80  Pulse: 71 69  Resp: 15 16  Temp: 98.3 F (36.8 C) 98 F (36.7 C)  SpO2: 100% 100%   Relatively controlled on 5/25 12.OSA. Continue CPAP 13.  Acute blood loss anemia  Hemoglobin 10.2 on 5/5, labs ordered for tomorrow  Continue to monitor 14.  Constipation cahnge colace to pill form 15.  Severe spasticity , s/p dysport injection 5/14 to R biceps, Hamstrings, and add longus  Improving , added tizanidine, dose increased on 5/5  LOS: 27 days A FACE TO FACE EVALUATION WAS PERFORMED   Karis Juba 05/15/2019, 11:25 AM

## 2019-05-15 NOTE — Progress Notes (Signed)
Occupational Therapy Session Note  Patient Details  Name: Gregory [name] MRN: [id number] Date of Birth: 02-01-1951  Today's Date: 05/15/2019 OT Individual Time: 0918-1000 OT Individual Time Calculation (min): 42 min    Short Term Goals: Week 4:  OT Short Term Goal 1 (Week 4): Continue working on established LTGs set mod to max assist overall.   Skilled Therapeutic Interventions/Progress Updates:    Session 1: 0918-1000    Pt worked on bathing and dressing during session.  Max assist for transfer into the shower with use of the Stedy secondary to time.  He then needed max assist for removal of LB clothing including brief, pants, gripper socks, and shoes.  He was able to complete UB bathing with min assist.  LB bathing was completed at mod assist with use of the grab bar on the right side for support.  He was able to complete transfer back to the wheelchair with max assist once shower was completed in order to work on dressing tasks at the sink.  He completed UB dressing with min assist and min instructional cueing for hemi technique.  LB dressing with max assist for all aspects of threading items and pulling over his hips.  Mod assist needed for sit to stand and static standing balance.  Finished session with pt in the wheelchair and call button and phone in reach.  Safety alarm in place as well.    Session 2:  [phone])  Pt's family in for education this session  Provided education with demonstration on squat pivot transfers to the drop arm commode as well as sit to stand transitions with and without the RW for support.  Both sons were able to provide safe assist with squat pivot transfers to the drop arm commode and back to the wheelchair.  Do not feel pt's ex-spouse would be able to assist with this at home.  She was able to safely help him stand from the wheelchair with use of the gait belt and RW with hand splint on the right for simulated toileting.  The sons were able to assist him with sit  to stand without the RW but it was stressed that for toileting or dressing, a second person would be needed to assist with pulling up pants while the other helped him stand.  They all voiced understanding.  Discussed benefit of tub bench at home as well, which spouse reports that they already have.  He will not be able to use it however until he can ambulate into the bathroom as it is not large enough for the wheelchair to fit in.  Discussed progression and tone in the RUE as well as demonstrated RUE hand over hand use with grasping items as well as simulated washing of the left arm.  Pt left with PT to continue education.  All family reports that they will be able to care for the pt at discharge planned for 5/27.    Therapy Documentation Precautions:  Precautions Precautions: Fall Precaution Comments: Expressive aphasia, R hemiparesis Restrictions Weight Bearing Restrictions: No  Pain: Pain Assessment Pain Scale: Faces Pain Score: 0-No pain Faces Pain Scale: No hurt Pain Type: Acute pain Pain Location: Foot Pain Orientation: Right Pain Descriptors / Indicators: Discomfort Pain Onset: With Activity Pain Intervention(s): Repositioned ADL: See Care Tool Section for some details of ADL  Therapy/Group: Individual Therapy  Erma Joubert OTR/L 05/15/2019, 12:19 PM

## 2019-05-15 NOTE — Progress Notes (Signed)
Physical Therapy Session Note  Patient Details  Name: Gregory [name] MRN: [id number] Date of Birth: 11/17/1951  Today's Date: 05/15/2019 PT Individual Time: 3403-5248 and 1115-1200  PT Individual Time Calculation (min): 45 min and 45 min  Short Term Goals: Week 4:  PT Short Term Goal 1 (Week 4): STG=LTG due to ELOS  Skilled Therapeutic Interventions/Progress Updates:    Session 1: Pt supine in bed upon PT arrival, agreeable to therapy tx and reports R heel pain, blister visibly noted. Therapist educated pt on wearing pressure relief boot over night for pressure relief of the heel, pt shakes his head no. Pt supine in bed while therapist assisted to don pants, rolling in both directions mod assist while therapist pull pants over hips total assist. Pt transferred to sitting EOB with use of bedrails and min assist. Pt seated EOB with supervision-CGA for sitting balance while therapist donned socks and shoes total assist. Pt performed slideboard transfer with total assist for set up, pt performs transfer with min assist. Pt worked on w/c propulsion this session using L hemi technique, therapist educating pt on techniques, pt propelled chair x 50 ft with min assist. Pt left seated in w/c at end of session with needs in reach and chair alarm set .   Session 2: Pt seated in w/c upon PT arrival, agreeable to therapy tx and denies pain at rest. Pt's family present for family education (son's and ex-wife). Pt transported to the gym. Pt performed car transfer this session x 2, once with assist from therapist while therapist educated family on techniques and provided demonstration mod assist and then once with assist from 2 sons. Pt performed sit<>stand at the sink to pull up pants and briefs. Therapist provided education handouts on hamstring and hip adductor stretching at home to be done at least once daily. Therapist provided education on stair bumping techniques and provided handout. Therapist had pt's  family practice tilting and removing antitippers on w/c. Pt transported back to room and left with family present and needs in reach, chair alarm set.   Therapy Documentation Precautions:  Precautions Precautions: Fall Precaution Comments: Expressive aphasia, R hemiparesis Restrictions Weight Bearing Restrictions: No    Therapy/Group: Individual Therapy  Cresenciano Genre, PT, DPT 05/15/2019, 7:54 AM

## 2019-05-15 NOTE — Progress Notes (Signed)
Social Work Patient ID: Gregory [name], male   DOB: 1951/08/04, 68 y.o.   MRN: [id number] met with pt and family members who are here for education and to learn pt's care for discharge on Wed. All report it went well and they will be able to do it. Discussed home health and equipment and will have delivered tomorrow or early Wed prior to DC home. Will work on discharge needs. Pt very excited to be getting out of here. Son's asked to speak with PA or MD to get their medical questions answered.

## 2019-05-15 NOTE — Progress Notes (Signed)
Speech Language Pathology Daily Session Note  Patient Details  Name: Gregory [name] MRN: [id number] Date of Birth: 1951/05/17  Today's Date: 05/15/2019 SLP Individual Time: 1330-1400, 1200-1230  SLP Individual Time Calculation (min): 30 min and 30 min  Short Term Goals: Week 4: SLP Short Term Goal 1 (Week 4): STG=LTG due to short ELOS  Skilled Therapeutic Interventions: 1#  Skilled ST services focused on education and language skills. Pt's ex-wife and two sons were present for education. SLP facilitated education of swallow strategies on regular/thin diet ( check right pocketing, small bites), explanation of aphasia/apraxia and communication via primarily yes/no response. SLP explained pt is stimulible for CV and CVC initial /p/, /b/ and /m/ words and able to spell CVC words using alphabet board with mod A. SLP facilitated speech production of CVC /p/ words with supervision A. All questions answered to satisfaction. Pt was left in room with call bell within reach and chair alarm set. ST recommends to continue skilled ST services.   2#  Skilled ST services focused on language skills. SLP facilitated speech production of initial /b/ CV and CVC words when presented with pictures, pt demonstrated ability to produce 6 out 13 words and 8 out 13 words with imitation. Pt expressed pain in right leg (grabbing and facial grimace) in reponse to yes/no questions, pain was confirmed and pt rated 8 out 10 when given letter/number board. SLP notified nurse and provided repositioning and massage. Pt was left in room with call bell within reach and bed alarm set. ST recommends to continue skilled ST services.      Pain Pain Assessment Pain Score: 8  Pain Type: Acute pain Pain Location: Leg Pain Orientation: Right Pain Descriptors / Indicators: Cramping Pain Onset: Sudden Pain Intervention(s): Relaxation;RN made aware;Repositioned;Massage  Therapy/Group: Individual Therapy  Gregory [name]  [facility] 05/15/2019,  3:21 PM

## 2019-05-15 NOTE — Progress Notes (Signed)
    Diagnosis codes:R41.844, I69.391, I63.9  Height: 5'4            Weight: 180       Patient suffers from L-CVA    which impairs his ability to perform daily activities like ADL's and toileting  in the home.  A walker  will not resolve issue with performing activities of daily living.  A wheelchair will allow patient to safely perform daily activities.  Patient is not able to propel themselves in the home using a standard weight wheelchair due to endurance and fatigue  Patient can self propel in the lightweight wheelchair.

## 2019-05-16 ENCOUNTER — Inpatient Hospital Stay (HOSPITAL_COMMUNITY): Payer: Medicare HMO

## 2019-05-16 ENCOUNTER — Inpatient Hospital Stay (HOSPITAL_COMMUNITY): Payer: Medicare HMO | Admitting: Physical Therapy

## 2019-05-16 ENCOUNTER — Inpatient Hospital Stay (HOSPITAL_COMMUNITY): Payer: Medicare HMO | Admitting: Occupational Therapy

## 2019-05-16 ENCOUNTER — Inpatient Hospital Stay (HOSPITAL_COMMUNITY): Payer: Medicare HMO | Admitting: Speech Pathology

## 2019-05-16 DIAGNOSIS — M79604 Pain in right leg: Secondary | ICD-10-CM

## 2019-05-16 LAB — CBC WITH DIFFERENTIAL/PLATELET
Abs Immature Granulocytes: 0.05 10*3/uL (ref 0.00–0.07)
Basophils Absolute: 0 10*3/uL (ref 0.0–0.1)
Basophils Relative: 0 %
Eosinophils Absolute: 0.5 10*3/uL (ref 0.0–0.5)
Eosinophils Relative: 6 %
HCT: 37.4 % — ABNORMAL LOW (ref 39.0–52.0)
Hemoglobin: 11.2 g/dL — ABNORMAL LOW (ref 13.0–17.0)
Immature Granulocytes: 1 %
Lymphocytes Relative: 25 %
Lymphs Abs: 2.2 10*3/uL (ref 0.7–4.0)
MCH: 28.4 pg (ref 26.0–34.0)
MCHC: 29.9 g/dL — ABNORMAL LOW (ref 30.0–36.0)
MCV: 94.9 fL (ref 80.0–100.0)
Monocytes Absolute: 1 10*3/uL (ref 0.1–1.0)
Monocytes Relative: 11 %
Neutro Abs: 5 10*3/uL (ref 1.7–7.7)
Neutrophils Relative %: 57 %
Platelets: 177 10*3/uL (ref 150–400)
RBC: 3.94 MIL/uL — ABNORMAL LOW (ref 4.22–5.81)
RDW: 14.6 % (ref 11.5–15.5)
WBC: 8.9 10*3/uL (ref 4.0–10.5)
nRBC: 0 % (ref 0.0–0.2)

## 2019-05-16 LAB — BASIC METABOLIC PANEL
Anion gap: 12 (ref 5–15)
BUN: 106 mg/dL — ABNORMAL HIGH (ref 8–23)
CO2: 16 mmol/L — ABNORMAL LOW (ref 22–32)
Calcium: 10 mg/dL (ref 8.9–10.3)
Chloride: 106 mmol/L (ref 98–111)
Creatinine, Ser: 4.13 mg/dL — ABNORMAL HIGH (ref 0.61–1.24)
GFR calc Af Amer: 16 mL/min — ABNORMAL LOW (ref >60)
GFR calc non Af Amer: 14 mL/min — ABNORMAL LOW (ref >60)
Glucose, Bld: 93 mg/dL (ref 70–99)
Potassium: 5.8 mmol/L — ABNORMAL HIGH (ref 3.5–5.1)
Sodium: 134 mmol/L — ABNORMAL LOW (ref 135–145)

## 2019-05-16 MED ORDER — ASPIRIN 325 MG PO TABS: 325.0000 mg | ORAL_TABLET | Freq: Every day | ORAL | Status: AC

## 2019-05-16 MED ORDER — TIZANIDINE HCL 4 MG PO TABS: 4.0000 mg | ORAL_TABLET | Freq: Three times a day (TID) | ORAL | 0 refills | Status: DC

## 2019-05-16 MED ORDER — ATORVASTATIN CALCIUM 80 MG PO TABS: 80.0000 mg | ORAL_TABLET | Freq: Every day | ORAL | 1 refills | Status: DC

## 2019-05-16 MED ORDER — CLOPIDOGREL BISULFATE 75 MG PO TABS: 75.0000 mg | ORAL_TABLET | Freq: Every day | ORAL | 11 refills | Status: AC

## 2019-05-16 MED ORDER — TRAMADOL HCL 50 MG PO TABS: 50.0000 mg | ORAL_TABLET | Freq: Two times a day (BID) | ORAL | 0 refills | Status: AC | PRN

## 2019-05-16 MED ORDER — METOPROLOL TARTRATE 25 MG PO TABS: 25.0000 mg | ORAL_TABLET | Freq: Two times a day (BID) | ORAL | 0 refills | Status: AC

## 2019-05-16 MED ORDER — ATORVASTATIN CALCIUM 80 MG PO TABS: 80.0000 mg | ORAL_TABLET | Freq: Every day | ORAL | 1 refills | Status: AC

## 2019-05-16 NOTE — Progress Notes (Signed)
Physical Therapy Session Note  Patient Details  Name: Gregory [name] MRN: [id number] Date of Birth: [birth year]  Today's Date: 05/16/2019 PT Individual Time: 4917-9150 PT Individual Time Calculation (min): 58 min   Short Term Goals: Week 4:  PT Short Term Goal 1 (Week 4): STG=LTG due to ELOS  Skilled Therapeutic Interventions/Progress Updates:    Pt supine in bed upon PT arrival, agreeable to therapy tx and denies pain at rest. During standing and R LE weightbearing pt reports increased R LE heel pain. Pt transferred to sitting with min assist using bed rails and bed features. Pt donned pants and performed sit<>stand with mod assist to pull pants over hips. Pt performed slideboard transfer to the w/c with min assist, cues for techniques and head hips relationship. Pt worked on w/c propulsion this session, propelled w/c x 50 ft with supervision using L UE and L LE, increased time to complete, therapist providing encouragement, assist with w/c parts management. Pt performed car transfer this session with slideboard and mod assist, cues for techniques and weightshifting. Pt performed slideboard transfer this session from w/c<>mat with min assist and cues for techniques/placement. Pt seated edge of mat while therapist performed R LE stretching to hamstrings and hip rotators. Pt performed x 2 sit<>stands from mat with min assist and decreased R LE hamstring spasticity noted. Pt transported back to room and left seated in w/c with needs in reach and chair alarm set.   Therapy Documentation Precautions:  Precautions Precautions: Fall Precaution Comments: Expressive aphasia, R hemiparesis Restrictions Weight Bearing Restrictions: No    Therapy/Group: Individual Therapy  Cresenciano Genre , PT, DPT 05/16/2019, 3:35 PM

## 2019-05-16 NOTE — Discharge Instructions (Signed)
Inpatient Rehab Discharge Instructions  Gregory HazyDonald Lee [name] Discharge date and time: No discharge date for patient encounter.   Activities/Precautions/ Functional Status: Activity: activity as tolerated Diet:  Wound Care: none needed Functional status:  ___ No restrictions     ___ Walk up steps independently ___ 24/7 supervision/assistance   ___ Walk up steps with assistance ___ Intermittent supervision/assistance  ___ Bathe/dress independently ___ Walk with walker     _x__ Bathe/dress with assistance ___ Walk Independently    ___ Shower independently ___ Walk with assistance    ___ Shower with assistance ___ No alcohol     ___ Return to work/school ________  Special Instructions:  Foley catheter tube to remain in place until follow-up with urology services   COMMUNITY REFERRALS UPON DISCHARGE:    Home Health:   PT, OT, SP, RN, AIDE   Agency:[facility]   Phone:[phone]   Date of last service:05/17/2019   Medical Equipment/Items Ordered:HOSPTIAL BED, WHEELCHAIR, 30 TRANSFER BOARD DROP-ARM BEDSIDE COMMODE  Agency/Supplier:[facility]  [phone]  Other:WIFE TO PURSUE MEDICAID FOR AIDE SERVICES  GENERAL COMMUNITY RESOURCES FOR PATIENT/FAMILY: Support Groups:[facility] SUPPORT GROUP THE SECOND Thursday OF EACH MONTH ( SEPT-MAY ) @ 6;00-7;00 PM ON THE REHAB UNIT QUESTIONS CALL AMY [phone]   STROKE/TIA DISCHARGE INSTRUCTIONS SMOKING Cigarette smoking nearly doubles your risk of having a stroke & is the single most alterable risk factor  If you smoke or have smoked in the last 12 months, you are advised to quit smoking for your health.  Most of the excess cardiovascular risk related to smoking disappears within a year of stopping.  Ask you doctor about anti-smoking medications  [place] Quit Line: 1-800-QUIT NOW  Free Smoking Cessation Classes [phone]  CHOLESTEROL Know your levels; limit fat & cholesterol in your diet  Lipid Panel     Component Value Date/Time   CHOL 160 04/06/2019 0015   TRIG 154 (H) 04/06/2019 0015   HDL 19 (L) 04/06/2019 0015   CHOLHDL 8.4 04/06/2019 0015   VLDL 31 04/06/2019 0015   LDLCALC 110 (H) 04/06/2019 0015      Many patients benefit from treatment even if their cholesterol is at goal.  Goal: Total Cholesterol (CHOL) less than 160  Goal:  Triglycerides (TRIG) less than 150  Goal:  HDL greater than 40  Goal:  LDL (LDLCALC) less than 100   BLOOD PRESSURE American Stroke Association blood pressure target is less that 120/80 mm/Hg  Your discharge blood pressure is:  BP: (!) 144/61  Monitor your blood pressure  Limit your salt and alcohol intake  Many individuals will require more than one medication for high blood pressure  DIABETES (A1c is a blood sugar average for last 3 months) Goal HGBA1c is under 7% (HBGA1c is blood sugar average for last 3 months)  Diabetes: No known diagnosis of diabetes    Lab Results  Component Value Date   HGBA1C 6.0 (H) 04/06/2019     Your HGBA1c can be lowered with medications, healthy diet, and exercise.  Check your blood sugar as directed by your physician  Call your physician if you experience unexplained or low blood sugars.  PHYSICAL ACTIVITY/REHABILITATION Goal is 30 minutes at least 4 days per week  Activity: Increase activity slowly, Therapies: Physical Therapy: Home Health Return to work:   Activity decreases your risk of heart attack and stroke and makes your heart stronger.  It helps control your weight and blood pressure; helps you relax and can improve your mood.  Participate  in a regular exercise program.  Talk with your doctor about the best form of exercise for you (dancing, walking, swimming, cycling).  DIET/WEIGHT Goal is to maintain a healthy weight  Your discharge diet is:  Diet Order            DIET - DYS 1 Room service appropriate? No; Fluid consistency: Honey Thick  Diet effective now              liquids Your height is:  Height: 5\' 4"  (162.6  cm) Your current weight is: Weight: 83.2 kg Your Body Mass Index (BMI) is:  BMI (Calculated): 31.47  Following the type of diet specifically designed for you will help prevent another stroke.  Your goal weight range is:    Your goal Body Mass Index (BMI) is 19-24.  Healthy food habits can help reduce 3 risk factors for stroke:  High cholesterol, hypertension, and excess weight.  RESOURCES Stroke/Support Group:  Call [phone]   STROKE EDUCATION PROVIDED/REVIEWED AND GIVEN TO PATIENT Stroke warning signs and symptoms How to activate emergency medical system (call 911). Medications prescribed at discharge. Need for follow-up after discharge. Personal risk factors for stroke. Pneumonia vaccine given:  Flu vaccine given:  My questions have been answered, the writing is legible, and I understand these instructions.  I will adhere to these goals & educational materials that have been provided to me after my discharge from the hospital.     My questions have been answered and I understand these instructions. I will adhere to these goals and the provided educational materials after my discharge from the hospital.  Patient/Caregiver Signature _______________________________ Date __________  Clinician Signature _______________________________________ Date __________  Please bring this form and your medication list with you to all your follow-up doctor's appointments.

## 2019-05-16 NOTE — Progress Notes (Signed)
Right lower extremity venous duplex completed. Results in Chart review CV Proc. [place] Davine Coba,RVS 05/16/2019, 5:31

## 2019-05-16 NOTE — Progress Notes (Signed)
Social Work  Discharge Note  The overall goal for the admission was met for:   Discharge location: [place] TO ASSIST-24 HR CARE  Length of Stay: Yes-28 DAYS  Discharge activity level: Yes-MIN-MOD LEVEL OF CARE  Home/community participation: Yes  Services provided included: MD, RD, PT, OT, SLP, RN, CM, Pharmacy and SW  Financial Services: Private Insurance: [employer]  Follow-up services arranged: Home Health: [place], DME: [facility]-HOSPITAL BED, WHEELCHAIR, DROP-ARM BEDSIDE COMMODE AND 30 TRANSFER BOARD and Patient/Family has no preference for HH/DME agencies  Comments (or additional information):ANNIE AND TWO SON'S WERE HERE FOR FAMILY TRAINING AND SAW HOW MUCH CARE PT REQUIRES THEY ALL FEEL THEY CAN DO THIS FOR HIM AT HOME. [facility]CT [facility] AND SEE IF ELIGIBLE FOR MEDICAID.   Patient/Family verbalized understanding of follow-up arrangements: Yes  Individual responsible for coordination of the follow-up plan: ANNIE-EX-WIFE & Kia-SON  Confirmed correct DME delivered: Elease Hashimoto 05/16/2019    Elease Hashimoto

## 2019-05-16 NOTE — Progress Notes (Signed)
Occupational Therapy Discharge Summary  Patient Details  Name: Gregory [name] MRN: [id number] Date of Birth: [birth year]  Today's Date: 05/16/2019 OT Individual Time: 0922-1033 OT Individual Time Calculation (min): 71 min    Session Note:  Pt completed transfer squat pivot to the wheelchair with mod assist to start session.  He then worked on shaving at the sink with mod assist for thoroughness this session secondary to not having an Copy.  He then worked on bathing sit to stand.  Min assist for UB bathing with max hand over hand for RUE use.  Mod assist for LB bathing as well.  Overall min assist to donn a pullover shirt with max assist for all LB dressing.  Pt left sitting in the wheelchair with call button and phone in reach and alarm belt in place.    Patient has met 5 of 11 long term goals due to improved activity tolerance, improved balance, postural control, ability to compensate for deficits, functional use of  RIGHT upper extremity and improved attention.  Patient to discharge at overall Mod Assist level.  Patient's care partner is independent to provide the necessary physical and cognitive assistance at discharge.    Reasons goals not met: Pt currently needs max assist for all stand pivot transfers to the shower and the toilet.  LB bathing is at a mod assist with LB dressing at a max assist level.  He needs max assist to use the RUE as a stabilizer with selfcare tasks.  Dynamic sitting balance is also at a min to mod assist with standing balance at a max assist level.   Recommendation:  Patient will benefit from ongoing skilled OT services in home health setting to continue to advance functional skills in the area of BADL.  Feel pt continues to need extensive OT via home health secondary to his continued deficits with balance, awareness, memory, right hemiparesis, and current need for mod to max assist for some selfcare tasks.  Pt's family has been educated on safe assist with  selfcare tasks, and his son's have demonstrated safe assist at this time.  Do not feel that his ex-spouse can provide enough physical assist to help him with transfers at this time.  Would recommend continued HHOT to further progress ADL independence so she will safely be able to assist him.    Equipment: drop arm commode  Reasons for discharge: treatment goals met and discharge from hospital  Patient/family agrees with progress made and goals achieved: Yes  OT Discharge Precautions/Restrictions  Precautions Precautions: Fall Precaution Comments: Expressive aphasia, R hemiparesis Restrictions Weight Bearing Restrictions: No   Pain Pain Assessment Pain Scale: Faces Pain Score: Asleep Faces Pain Scale: Hurts whole lot Pain Type: Acute pain Pain Location: Ankle Pain Orientation: Right Pain Descriptors / Indicators: Grimacing;Guarding Pain Frequency: Constant Pain Onset: On-going Pain Intervention(s): Repositioned ADL ADL Eating: Set up Where Assessed-Eating: Wheelchair Grooming: Setup Where Assessed-Grooming: Wheelchair Upper Body Bathing: Minimal assistance Where Assessed-Upper Body Bathing: Wheelchair Lower Body Bathing: Moderate assistance Where Assessed-Lower Body Bathing: Wheelchair Upper Body Dressing: Minimal assistance Where Assessed-Upper Body Dressing: Wheelchair Lower Body Dressing: Maximal assistance Where Assessed-Lower Body Dressing: Wheelchair Toileting: Maximal cueing Where Assessed-Toileting: Bedside Commode Toilet Transfer: Moderate assistance Toilet Transfer Method: Engineer, water: Radiographer, therapeutic: Not assessed Social research officer, government: Maximal Firefighter Method: Radiographer, therapeutic: Gaffer Baseline Vision/History: Wears glasses Wears Glasses: Reading only(No glasses present) Vision Assessment?: Yes Eye Alignment: Within  Functional  Limits Ocular Range of Motion: Within Functional Limits Tracking/Visual Pursuits: Decreased smoothness of vertical tracking;Decreased smoothness of horizontal tracking;Requires cues, head turns, or add eye shifts to track Convergence: Within functional limits Visual Fields: No apparent deficits Perception  Perception: Impaired Praxis Praxis: Impaired Praxis Impairment Details: Motor planning;Ideomotor Praxis-Other Comments: Decreased ability to coordinate opening of items or setup of washcloth with soap. Cognition Overall Cognitive Status: Impaired/Different from baseline Arousal/Alertness: Awake/alert Orientation Level: Oriented to person;Oriented to place Attention: Selective Selective Attention: Appears intact Memory: Impaired Memory Impairment: Storage deficit;Decreased short term memory Decreased Short Term Memory: Functional basic Awareness: Impaired Awareness Impairment: Emergent impairment;Anticipatory impairment Problem Solving: Impaired Executive Function: Sequencing Sequencing: Impaired Sequencing Impairment: Functional basic Safety/Judgment: Health visitor Light Touch: Impaired Detail Light Touch Impaired Details: Impaired RUE Hot/Cold: Impaired by gross assessment Proprioception: Impaired Detail Proprioception Impaired Details: Impaired RUE Stereognosis: Not tested Additional Comments: Pt able to detect light touch in the RUE but with decreased accuracy when asked to point to where he felt being touched.  Coordination Gross Motor Movements are Fluid and Coordinated: No Fine Motor Movements are Fluid and Coordinated: No Coordination and Movement Description: Pt currently Brunnstrum stage III in the right arm and stage II in the hand with only trace digit flexion.  He needs max hand over hand assist for washing the LUE and arm. Motor  Motor Motor: Hemiplegia Motor - Discharge Observations: right hemiparesis in the UE and LE with increased  flexor tone in the elbow, hamstrings, and adductors Mobility  Bed Mobility Rolling Right: Minimal Assistance - Patient > 75% Supine to Sit: Moderate Assistance - Patient 50-74% Transfers Sit to Stand: Moderate Assistance - Patient 50-74% Stand to Sit: Moderate Assistance - Patient 50-74%  Trunk/Postural Assessment  Cervical Assessment Cervical Assessment: Within Functional Limits Thoracic Assessment Thoracic Assessment: Exceptions to WFL(increased right trunk elongation with left side shortening) Lumbar Assessment Lumbar Assessment: Exceptions to WFL(posterior pelvic tilt) Postural Control Postural Control: (Pt with increased weightbearing over the right hip in sitting with decreased ability to achieve and maintain midline orientation)  Balance Balance Balance Assessed: Yes Static Sitting Balance Static Sitting - Balance Support: Feet supported Static Sitting - Level of Assistance: 5: Stand by assistance Dynamic Sitting Balance Dynamic Sitting - Balance Support: Feet supported;During functional activity Dynamic Sitting - Level of Assistance: 4: Min assist Static Standing Balance Static Standing - Balance Support: During functional activity;Left upper extremity supported Static Standing - Level of Assistance: 3: Mod assist Dynamic Standing Balance Dynamic Standing - Balance Support: During functional activity;Left upper extremity supported Dynamic Standing - Level of Assistance: 2: Max assist Extremity/Trunk Assessment RUE Assessment RUE Assessment: Exceptions to [facility] Passive Range of Motion (PROM) Comments: PROM WFLs for all joints with slight flexor tone noted in the left elbow and digits General Strength Comments: Pt currently Brunnstrum stage III movement in the arm with greater active movement at the shoulder level compared to the elbow.  Brunnstrum stage II movement in the hand. LUE Assessment LUE Assessment: Within Functional Limits   Madie Cahn OTR/L 05/16/2019,  12:13 PM

## 2019-05-16 NOTE — Progress Notes (Signed)
Speech Language Pathology Discharge Summary  Patient Details  Name: Gemini Beaumier MRN: [id number] Date of Birth: [birth year]  Today's Date: 05/16/2019 SLP Individual Time: 0730-0800 SLP Individual Time Calculation (min): 30 min   Skilled Therapeutic Interventions:  Skilled treatment session focused on communication goals and completing education with pt in preparation for discharge on 05/17/19. Pt able to utilize communication board to express answers to basic questions. Education provided on recommendations for continued ST services. Pt indicates understanding by answering yes/no (gestures).     Patient has met 6 of 6 long term goals.  Patient to discharge at overall Supervision;Min level.   Clinical Impression/Discharge Summary:   Pt has made slow progress in skilled ST sessions and as a result he is able to utilize a low tech picture board to express wants and needs. Additionally, he has started producing some bilabial consonants with Max A cues. 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   Care Partner:  Caregiver Able to Provide Assistance: Yes  Type of Caregiver Assistance: Physical;Cognitive  Recommendation:  24 hour supervision/assistance;Home Health SLP;Outpatient SLP  Rationale for SLP Follow Up: Maximize functional communication;Reduce caregiver burden   Equipment:   N/A  Reasons for discharge: Discharged from hospital   Patient/Family Agrees with Progress Made and Goals Achieved: Yes    Luana Tatro 05/16/2019, 9:21 AM

## 2019-05-16 NOTE — Progress Notes (Signed)
[facility] PHYSICAL MEDICINE & REHABILITATION PROGRESS NOTE   Subjective/Complaints: RLE noted per RN, pt points to Right heel, history limited by aphasia    ROS: Difficult to assess accuracy secondary to his aphasia   Objective:   No results found. Recent Labs    05/16/19 0508  WBC 8.9  HGB 11.2*  HCT 37.4*  PLT 177   Recent Labs    05/16/19 0508  NA 134*  K 5.8*  CL 106  CO2 16*  GLUCOSE 93  BUN 106*  CREATININE 4.13*  CALCIUM 10.0    Intake/Output Summary (Last 24 hours) at 05/16/2019 0830 Last data filed at 05/16/2019 0500 Gross per 24 hour  Intake 220 ml  Output 800 ml  Net -580 ml     Physical Exam: Vital Signs Blood pressure 123/85, pulse 82, temperature 98.3 F (36.8 C), temperature source Oral, resp. rate 16, height 5\' 4"  (1.626 m), weight 80 kg, SpO2 100 %. Constitutional: No distress . Vital signs reviewed. HENT: Normocephalic.  Atraumatic. Eyes: EOMI. No discharge. Cardiovascular: No JVD. Respiratory: Normal effort. GI: Non-distended. Musc: No edema or tenderness in extremities. Neurologic: Alert Motor:  Right 3-/5 HF, KE, 2-/5 RIght foot cool  Expressive aphasia, nods y/n  Assessment/Plan: 1. 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  Physiatrist is providing close team supervision and 24 hour management of active medical problems listed below.  Physiatrist and rehab team continue to assess barriers to discharge/monitor patient progress toward functional and medical goals  Care Tool:  Bathing    Body parts bathed by patient: Right arm, Chest, Abdomen, Front perineal area, Left upper leg, Right upper leg, Face, Right lower leg, Left lower leg   Body parts bathed by helper: Buttocks, Left arm Body parts n/a: Right lower leg, Left lower leg   Bathing assist Assist Level: Moderate Assistance  - Patient 50 - 74%     Upper Body Dressing/Undressing Upper body dressing   What is the patient wearing?: Pull over shirt    Upper body assist Assist Level: Minimal Assistance - Patient > 75%    Lower Body Dressing/Undressing Lower body dressing      What is the patient wearing?: Pants, Incontinence brief     Lower body assist Assist for lower body dressing: Maximal Assistance - Patient 25 - 49%     Toileting Toileting    Toileting assist Assist for toileting: 2 Helpers     Transfers Chair/bed transfer  Transfers assist     Chair/bed transfer assist level: Moderate Assistance - Patient 50 - 74%     Locomotion Ambulation   Ambulation assist   Ambulation activity did not occur: Safety/medical concerns  Assist level: 2 helpers Assistive device: Other (comment)(3 muskateer) Max distance: 5 ft   Walk 10 feet activity   Assist  Walk 10 feet activity did not occur: Safety/medical concerns  Assist level: 2 helpers Assistive device: Other (comment)(3 muskateer)   Walk 50 feet activity   Assist Walk 50 feet with 2 turns activity did not occur: Safety/medical concerns         Walk 150 feet activity   Assist Walk 150 feet activity did not occur: Safety/medical concerns         Walk 10 feet on uneven surface  activity   Assist Walk 10 feet on uneven surfaces activity did not occur: Safety/medical concerns  Wheelchair     Assist Will patient use wheelchair at discharge?: Yes Type of Wheelchair: Manual    Wheelchair assist level: Dependent - Patient 0% Max wheelchair distance: 150 ft    Wheelchair 50 feet with 2 turns activity    Assist        Assist Level: Dependent - Patient 0%   Wheelchair 150 feet activity     Assist     Assist Level: Dependent - Patient 0%    Medical Problem List and Plan: 1.Right hemiparesis and aphasia/dysphagiasecondary to left insular and posterior frontal infarct embolic  secondary to non-STEMI. Status post loop recorder  Continue CIR   Weekend notes reviewed-stable 2. Antithrombotics: -DVT/anticoagulation:Subcutaneous heparin. -antiplatelet therapy: Aspirin 325 mg daily change to non enteric coated, Plavix 75 mg daily 3. Pain Management:Tylenol as needed, right Achilles pain improved with PR AFO use   Tizanidine increased to 4 mg 3 times daily on 5/25 4. Mood:Provide emotional support -antipsychotic agents: N/A 5. Neuropsych: This patientisnot fully capable of making decisions on hisown behalf. 6. Skin/Wound Care:Routine skin checks 7. Fluids/Electrolytes/Nutrition:Routine in and outs  Hyperkalemia, improved on V[place], this has now been discontinued and potassium up give kaexalate , may need to reconsult nephro if this persists8.Dysphagia. Advanced to regular diet  Monitor for aspiration    Off IVF currently 9.Acute on chronic kidney disease stage III/hyperkalemia with obstructive uropathy/BPH. Follow-up urology services as well as nephrology services. -FOLEY TUBE TO REMAIN IN PLACE UNTIL FOLLOW UP OUT PATIENT WITH UROLOGY  Creatinine 4.13 on 5/16, labs ordered for tomorrow 10.Hyperlipidemia. Lipitor 11.Hypertension. Lopressor 25 mg twice daily. Monitor with increased mobility Vitals:   05/15/19 1936 05/16/19 0440  BP: (!) 126/54 123/85  Pulse: 75 82  Resp: 16 16  Temp: 98 F (36.7 C) 98.3 F (36.8 C)  SpO2: 100% 100%   Relatively controlled on 5/26 12.OSA. Continue CPAP 13.  Acute blood loss anemia  Hemoglobin 10.2 on 5/5, labs ordered for tomorrow  Continue to monitor 14.  Constipation cahnge colace to pill form 15.  Severe spasticity , s/p dysport injection 5/14 to R biceps, Hamstrings, and add longus  Improving , added tizanidine, dose increased on 5/5  LOS: 28 days A FACE TO FACE EVALUATION WAS PERFORMED  Erick Colacendrew E Azura Tufaro 05/16/2019, 8:30 AM

## 2019-05-16 NOTE — Progress Notes (Signed)
Physical Therapy Discharge Summary  Patient Details  Name: Gregory [name] MRN: [id number] Date of Birth: December 19, 1951   Patient has met 8 of 9 long term goals due to improved activity tolerance, improved balance, improved postural control, decreased pain, improved attention and improved awareness.  Patient to discharge at a wheelchair level [place].   Patient's care partner is independent to provide the necessary physical assistance at discharge. The pt's two sons and his ex-wife have all been present for hands-on family education including transfers with and without the slideboard and bed mobility. Therapist recommending ramp for long term independence however the ramp will not be built in time. There family has been educated on how to perform w/c bumping up/down steps with handout also provided. Therapist also provided education and handouts regarding R LE stretching for tone management at home.   Reasons goals not met: Pt did not meet his ambulation goal as this goal is no longer applicable. Pt will be functioning from a w/c level at discharge and is unable to ambulate secondary to severe R UE and R LE tone and spasticity.   Recommendation:  Patient will benefit from ongoing skilled PT services in home health setting to continue to advance safe functional mobility, address ongoing impairments in balance, tone, range of motion, strength, and minimize fall risk.  Equipment: slideboard, w/c, RW and hospital bed  Reasons for discharge: treatment goals met and discharge from hospital  Patient/family agrees with progress made and goals achieved: Yes  PT Discharge Precautions/Restrictions Precautions Precautions: Fall Precaution Comments: Expressive aphasia, R hemiparesis Restrictions Weight Bearing Restrictions: No Vital Signs Therapy Vitals Temp: 98.8 F (37.1 C) Pulse Rate: 64 Resp: 18 BP: 102/74 Patient Position (if appropriate): Lying Oxygen Therapy SpO2: 100 % O2 Device:  Room Air Cognition Overall Cognitive Status: Impaired/Different from baseline Arousal/Alertness: Awake/alert Orientation Level: Oriented to person;Oriented to place Attention: Selective Selective Attention: Appears intact Memory: Impaired Memory Impairment: Storage deficit;Decreased short term memory Awareness: Impaired Problem Solving: Impaired Sequencing: Impaired Safety/Judgment: Appears intact Sensation Sensation Light Touch: Impaired Detail Light Touch Impaired Details: Impaired RUE;Impaired RLE Proprioception: Impaired Detail Proprioception Impaired Details: Impaired RUE;Impaired RLE Additional Comments: Pt able to detect light touch in the RUE/LE but with decreased accuracy when asked to point to where he felt being touched.  Coordination Gross Motor Movements are Fluid and Coordinated: No Fine Motor Movements are Fluid and Coordinated: No Coordination and Movement Description: pt limited with LE coordination secondary to R LE tone and spasticity Heel Shin Test: impaired R LE secondary to tone and weakness Motor  Motor Motor: Hemiplegia;Abnormal tone Motor - Discharge Observations: right hemiparesis in the UE and LE with increased flexor tone in the elbow, hamstrings, and adductors  Mobility Bed Mobility Rolling Right: Minimal Assistance - Patient > 75% Rolling Left: Minimal Assistance - Patient > 75% Supine to Sit: Minimal Assistance - Patient > 75% Sit to Supine: Minimal Assistance - Patient > 75% Transfers Transfers: Sit to Stand;Stand to Sit;Stand Pivot Transfers Sit to Stand: Moderate Assistance - Patient 50-74% Stand to Sit: Moderate Assistance - Patient 50-74% Stand Pivot Transfers: Moderate Assistance - Patient 50 - 74% Stand Pivot Transfer Details: Verbal cues for precautions/safety;Verbal cues for safe use of DME/AE;Verbal cues for technique;Manual facilitation for placement Squat Pivot Transfers: Moderate Assistance - Patient 50-74% Locomotion   Gait Ambulation: No Gait Gait: No Stairs / Additional Locomotion Stairs: No Wheelchair Mobility Wheelchair Mobility: Yes Wheelchair Assistance: Supervision/Verbal cueing Wheelchair Propulsion: Left upper extremity;Left lower extremity Wheelchair Parts Management:  Needs assistance Distance: 50 ft  Trunk/Postural Assessment  Cervical Assessment Cervical Assessment: Within Functional Limits Thoracic Assessment Thoracic Assessment: Exceptions to [facility] Lumbar Assessment Lumbar Assessment: Exceptions to WFL(posterior pelvic tilt) Postural Control Postural Control: Deficits on evaluation(decreased midline orientation)  Balance Balance Balance Assessed: Yes Static Sitting Balance Static Sitting - Level of Assistance: 5: Stand by assistance Dynamic Sitting Balance Dynamic Sitting - Level of Assistance: 5: Stand by assistance;4: Min assist Static Standing Balance Static Standing - Level of Assistance: 3: Mod assist Dynamic Standing Balance Dynamic Standing - Level of Assistance: 2: Max assist Extremity Assessment  RLE Assessment RLE Assessment: Exceptions to [facility] Passive Range of Motion (PROM) Comments: limited PROM secondary to hypertonia and spasticity RLE Strength Right Hip Flexion: 2+/5 Right Hip Extension: 2+/5 Right Knee Flexion: 2-/5 Right Knee Extension: 2+/5 Right Ankle Dorsiflexion: 2+/5 Right Ankle Plantar Flexion: 2+/5 RLE Tone RLE Tone Comments: increased tone LLE Assessment LLE Assessment: Within Functional Limits    Netta Corrigan, PT, DPT 05/16/2019, 3:34 PM

## 2019-05-16 NOTE — Discharge Summary (Signed)
Physician Discharge Summary  Patient ID: Gregory [name] MRN: [id number] DOB/AGE: 1951-12-21 68 y.o.  Admit date: 04/18/2019 Discharge date: 05/17/2019  Discharge Diagnoses:  Active Problems:   Frontal lobe deficit   Aphasia   Acute blood loss anemia   AKI (acute kidney injury) (HCC)   CKD (chronic kidney disease), stage III (HCC)   Dysphagia, post-stroke   Loose stools   Labile blood pressure   Spastic hemiparesis (HCC) DVT prophylaxis Hyperlipidemia  Discharged Condition: Stable  Significant Diagnostic Studies: Ct Abdomen Pelvis Wo Contrast  Result Date: 04/16/2019 CLINICAL DATA:  Acute kidney injury, bilateral hydronephrosis and bladder outlet obstruction. EXAM: CT ABDOMEN AND PELVIS WITHOUT CONTRAST TECHNIQUE: Multidetector CT imaging of the abdomen and pelvis was performed following the standard protocol without IV contrast. COMPARISON:  CT of the abdomen and pelvis without contrast on 04/06/2019 and renal ultrasound on 04/05/2019. FINDINGS: Lower chest: No acute abnormality. Hepatobiliary: The liver is unremarkable. The gallbladder is nondistended and likely contains some tiny dependent calculi. No evidence of biliary ductal dilatation. Pancreas: Unremarkable. No pancreatic ductal dilatation or surrounding inflammatory changes. Spleen: Normal in size without focal abnormality. Adrenals/Urinary Tract: The kidneys demonstrate no hydronephrosis. The ureters are nondilated. No calculi identified. The bladder is largely decompressed by a Foley catheter. There is evidence persistent circumferential wall thickening likely on the basis of chronic outlet obstruction. Adrenal glands remain unremarkable bilaterally. Stomach/Bowel: Bowel shows no evidence of obstruction, ileus, inflammation or lesion. No free air identified. Vascular/Lymphatic: Calcified plaque throughout much of the abdominal aorta, visceral arteries and iliac arteries as well as the common femoral arteries. No evidence of  aneurysmal disease. No enlarged lymph nodes identified in the abdomen or pelvis. Reproductive: The prostate gland appears mild to moderately enlarged. Other: No abdominal wall hernia or abnormality. No abdominopelvic ascites. Musculoskeletal: No acute or significant osseous findings. IMPRESSION: 1. No evidence of recurrent hydronephrosis. The bladder is decompressed by a Foley catheter and demonstrates persistent circumferential wall thickening, likely on the basis of chronic outlet obstruction. 2. Probable small dependent calculi in the gallbladder. Electronically Signed   By: Irish Lack M.D.   On: 04/16/2019 12:29   Dg Ankle 2 Views Right  Result Date: 04/26/2019 CLINICAL DATA:  Acute RIGHT ankle pain, no known injury EXAM: RIGHT ANKLE - 2 VIEW COMPARISON:  None FINDINGS: Osseous mineralization normal. Joint spaces preserved. No fracture, dislocation, or bone destruction. Small vessel arterial calcifications noted at the ankle and foot. IMPRESSION: No acute osseous abnormalities. Electronically Signed   By: Ulyses Southward M.D.   On: 04/26/2019 13:35   Dg Swallowing Func-speech Pathology  Result Date: 04/20/2019 Objective Swallowing Evaluation: Type of Study: MBS-Modified Barium Swallow Study  Patient Details Name: Gregory [name] MRN: [id number] Date of Birth: 05/12/1951 Today's Date: 04/20/2019 Time: SLP Start Time (ACUTE ONLY): 1336 -SLP Stop Time (ACUTE ONLY): 1359 SLP Time Calculation (min) (ACUTE ONLY): 23 min Past Medical History: Past Medical History: Diagnosis Date . AKI (acute kidney injury) (HCC)  . CKD (chronic kidney disease) stage 2, GFR 60-89 ml/min  . CVA (cerebral vascular accident) (HCC)  . Hyperlipidemia  . Hypertension  . PVD (peripheral vascular disease) (HCC)  Past Surgical History: Past Surgical History: Procedure Laterality Date . AORTIC ARCH ANGIOGRAPHY N/A 09/09/2018  Procedure: AORTIC ARCH ANGIOGRAPHY;  Surgeon: Sherren Kerns, MD;  Location: [facility] INVASIVE CV LAB;  Service:  Cardiovascular;  Laterality: N/A; . CAROTID ANGIOGRAPHY Bilateral 09/09/2018  Procedure: CAROTID ANGIOGRAPHY;  Surgeon: Sherren Kerns, MD;  Location: [facility] INVASIVE  CV LAB;  Service: Cardiovascular;  Laterality: Bilateral; . LOOP RECORDER INSERTION N/A 04/07/2019  Procedure: LOOP RECORDER INSERTION;  Surgeon: Marinus Mawaylor, Gregg W, MD;  [facility];  Service: Cardiovascular;  Laterality: N/A; HPI: Pt is a 68 y.o. male with medical history significant of chronic kidney disease stage III, hypertension, hyperlipidemia, who presented to the hospital with chief complaint of sudden onset right-sided weakness and inability to talk.  MRI of the brain revealed 3.5 cm region of acute infarction affecting the left insula and posterior frontal region. Patient was admitted to to inpatient CIR on 04/10/19, [facility] with [facility].  Subjective: pleasant, lying in bed, aphasic and unable to speak Assessment / Plan / Recommendation CHL IP CLINICAL IMPRESSIONS 04/20/2019 Clinical Impression Pt presents with severe oral phase deficits c/b decreased difficulty lingually manipulating bolus with cyclical tongue pattern. This inability to manipulate bolus results significantly decreased bolus cohesion,  difficulty propelling bolus posteriorally, premature spillage and pieacemeal swallowing of all boluses. With soft solid and pill whole in puree, pt was unable to clear oral residue with pill being removed by SLP.  Although pt's swallow initiation occurs at the vallucula for nectar thick liquids and pyriform sinuses for thin liquids, no evidence of aspiraiton was observed when consuming either liquid. Pt was able to achieve good epiglottic deflection with only one instance of penetration. 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 Would recommend dysphagia 1 with thin liquids  via cup sips, medicine crushed in puree with full nursing supervision.  SLP Visit Diagnosis Aphasia (R47.01);Apraxia (R48.2);Dysphagia, oropharyngeal phase (R13.12) Attention and concentration deficit following -- Frontal lobe and executive function deficit following -- Impact on safety and function Moderate aspiration risk   CHL IP TREATMENT RECOMMENDATION 04/20/2019 Treatment Recommendations Therapy as outlined in treatment plan below   No flowsheet data found. CHL IP DIET RECOMMENDATION 04/20/2019 SLP Diet Recommendations Dysphagia 1 (Puree) solids;Thin liquid Liquid Administration via Cup Medication Administration Crushed with puree Compensations Minimize environmental distractions;Slow rate;Small sips/bites;Lingual sweep for clearance of pocketing Postural Changes Seated upright at 90 degrees   CHL IP OTHER RECOMMENDATIONS 04/20/2019 Recommended Consults -- Oral Care Recommendations Oral care BID Other Recommendations --   CHL IP FOLLOW UP RECOMMENDATIONS 04/20/2019 Follow up Recommendations Inpatient Rehab   CHL IP FREQUENCY AND DURATION 04/17/2019 Speech Therapy Frequency (ACUTE ONLY) min 2x/week Treatment Duration --      CHL IP ORAL PHASE 04/20/2019 Oral Phase Impaired Oral - Pudding Teaspoon -- Oral - Pudding Cup -- Oral - Honey Teaspoon NT Oral - Honey Cup -- Oral - Nectar Teaspoon NT Oral - Nectar Cup NT Oral - Nectar Straw -- Oral - Thin Teaspoon Weak lingual manipulation;Reduced posterior propulsion;Right anterior bolus loss;Delayed oral transit;Decreased bolus cohesion;Premature spillage;Piecemeal swallowing;Lingual pumping Oral - Thin Cup Right anterior bolus loss;Weak lingual manipulation;Reduced posterior propulsion;Delayed oral transit;Decreased bolus cohesion;Premature spillage;Piecemeal swallowing;Lingual pumping Oral - Thin Straw -- Oral - Puree Weak lingual manipulation;Reduced posterior propulsion;Decreased bolus cohesion;Premature spillage;Delayed oral transit;Lingual pumping Oral - Mech Soft  Impaired mastication;Weak lingual manipulation;Lingual pumping;Delayed oral transit;Reduced posterior propulsion;Decreased bolus cohesion;Premature spillage Oral - Regular -- Oral - Multi-Consistency -- Oral - Pill (No Data) Oral Phase - Comment --  CHL IP PHARYNGEAL PHASE 04/20/2019 Pharyngeal Phase Impaired Pharyngeal- Pudding Teaspoon -- Pharyngeal -- Pharyngeal- Pudding Cup -- Pharyngeal -- Pharyngeal- Honey Teaspoon NT Pharyngeal -- Pharyngeal- Honey Cup -- Pharyngeal -- Pharyngeal-  Nectar Teaspoon Delayed swallow initiation-vallecula Pharyngeal Material does not enter airway Pharyngeal- Nectar Cup Delayed swallow initiation-vallecula Pharyngeal Material does not enter airway Pharyngeal- Nectar Straw -- Pharyngeal -- Pharyngeal- Thin Teaspoon Delayed swallow initiation-pyriform sinuses Pharyngeal -- Pharyngeal- Thin Cup Delayed swallow initiation-pyriform sinuses Pharyngeal -- Pharyngeal- Thin Straw -- Pharyngeal -- Pharyngeal- Puree Delayed swallow initiation-vallecula Pharyngeal Material does not enter airway Pharyngeal- Mechanical Soft Delayed swallow initiation-vallecula Pharyngeal -- Pharyngeal- Regular -- Pharyngeal -- Pharyngeal- Multi-consistency -- Pharyngeal -- Pharyngeal- Pill (No Data) Pharyngeal -- Pharyngeal Comment --  CHL IP CERVICAL ESOPHAGEAL PHASE 04/20/2019 Cervical Esophageal Phase WFL Pudding Teaspoon -- Pudding Cup -- Honey Teaspoon -- Honey Cup -- Nectar Teaspoon -- Nectar Cup -- Nectar Straw -- Thin Teaspoon -- Thin Cup -- Thin Straw -- Puree -- Mechanical Soft -- Regular -- Multi-consistency -- Pill -- Cervical Esophageal Comment -- Gregory [name] 04/20/2019, 4:42 PM               Labs:  Basic Metabolic Panel: No results for input(s): NA, K, CL, CO2, GLUCOSE, BUN, CREATININE, CALCIUM, MG, PHOS in the last 168 hours.  CBC: Recent Labs  Lab 05/16/19 0508  WBC 8.9  NEUTROABS 5.0  HGB 11.2*  HCT 37.4*  MCV 94.9  PLT 177    CBG: No results for input(s): GLUCAP in the last  168 hours.  Family history.  Mother and father with hypertension.  Denies any renal failure or cancer.  Brief HPI:    Gregory [name] is a 68 year old right-handed male history of chronic kidney disease stage III, hypertension and hyperlipidemia.  Lives with spouse reportedly independent prior to admission.  Presented 04/05/2019 with acute onset of right sided weakness and slurred speech.  Cranial CT scan showed possible early gray-white differentiation loss in the insula on the left.  Chronic small vessel ischemic changes.  MRI and MRA showed a 3.5 cm region of acute infarction affecting the left insula and posterior frontal region.  No evidence of gross hemorrhage.  MRA negative.  Patient did not receive TPA.  Noted findings of hyperkalemia 6.8 as well as elevated creatinine 10.4 from baseline 1.70, troponin III 0.29.  EKG with ST changes.  Cardiology services consulted for suspect non-STEMI medical management recommended.  Patient did undergo placement of loop recorder 04/07/2019 per Dr. Ladona Ridgel.  Renal ultrasound showed bilateral hydronephrosis with nephrology services consulted as well as urology.  A Foley catheter tube was placed and would remain in place until follow-up outpatient urology.  Renal function continued to improve monitored creatinine 4.08.  Maintained on aspirin and Plavix for CVA prophylaxis.  Subcutaneous heparin for DVT prophylaxis.  Dysphasia #1 honey thick liquid diet.  Patient was admitted to inpatient rehab services 04/10/2019 with steady gains.  Noted on 04/14/2019 with noted ongoing bouts of hyperkalemia did not respond as expected to Lasix.  Nephrology recommended Kayexalate enema.  Became more confused felt would benefit from discharge to acute care services and monitored.  EKG follow-up without peaked T waves hemodynamically stable but remained on telemetry.  Hyperkalemia improving 5.8 maintained on Veltassa.  Therapies were resumed and patient returned back to inpatient rehab  services for comprehensive therapies   Hospital Course: Gregory [name] was admitted to rehab 04/18/2019 for inpatient therapies to consist of PT, ST and OT at least three hours five days a week. Past admission physiatrist, therapy team and rehab RN have worked together to provide customized collaborative inpatient rehab.  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  Status post loop recorder.  Patient would follow-up with neurology services.  Maintained on aspirin and Plavix.  Subcutaneous heparin for DVT prophylaxis.  Pain management with the use of Zanaflex 4 mg 3 times daily for spasticity.  His diet was slowly advanced to regular consistency.  Acute on chronic kidney disease stage III hyperkalemia with obstructive uropathy BPH patient would follow-up outpatient with both urology as well as nephrology.  Latest creatinine 4.13.  Patient remained on Lipitor for hyperlipidemia.  Blood pressure control with Lopressor and would follow-up with primary MD.  Bouts of constipation resolved with laxative assistance.  Physical exam.  Blood pressure 158/96 pulse 94 temperature 98.1 respirations 18 oxygen saturations 91% room air. Constitutional.  Well-developed HEENT Head.  Normocephalic and atraumatic Eyes.  Pupils round and reactive to light without discharge no nystagmus Neck.  Supple nontender no JVD without thyromegaly Cardiovascular normal rate exhibits no friction rub or murmur heard Respiratory.  Effort normal no respiratory distress without wheezes without rails GI.  Soft no distention nontender without rebound Musculoskeletal lower extremity edema 1+ Neurological.  Alert aphasic expressive greater than receptive could respond with yes no head nods right upper extremity with flexor tone biceps 2-3 out of 4 minimal voluntary movement right upper extremity right lower extremity 2 out of 5.  Rehab course: During patient's stay in rehab weekly team  conferences were held to monitor patient's progress, set goals and discuss barriers to discharge. At admission, patient required +2 safety for supine to sit moderate assist, moderate assist sit to supine.  Minimal assist upper body bathing max assist lower body bathing minimal assist upper body dressing max assist lower body dressing  He  has had improvement in activity tolerance, balance, postural control as well as ability to compensate for deficits. He has had improvement in functional use RUE/LUE  and RLE/LLE as well as improvement in awareness.  Focusing on family education.  Supine in bed assisted to don pants rolling in both directions moderate assist.  Transfers to sitting edge of bed and use of bed rails and minimal assistance.  Edge of bed with supervision contact-guard assist for sitting balance.  Perform sliding board transfers with total assist for set up.  Worked on wheelchair propulsion.  Max assist for transfers into the shower with the use of equipment.  He then needed max assist for removal of lower body clothing.  He was able to complete upper body bathing with minimal assist.  Lower body bathing was completed at a moderate assist level.  He was able to communicate his simple needs but noted expressive aphasia.  Full family teaching was completed plan discharge to home       Disposition: Discharge to home   Diet: Regular  Special Instructions: Foley catheter tube to remain in place until follow-up with urology services   Medications at discharge. 1.  Tylenol as needed 2.  Aspirin 325 mg p.o. daily 3.  Lipitor 80 mg p.o. daily 4.  Plavix 75 mg p.o. daily 5.  Colace twice daily 6.  Lopressor 25 mg p.o. twice daily 7.  Zanaflex 4 mg p.o. 3 times daily 8.  Ultram 50 mg p.o. every 12 hours as needed moderate pain   Discharge Instructions    Ambulatory referral to Neurology   Complete by:  As directed    An appointment is requested in approximately 4-6 weeks left insular  posterior frontal infarct   Ambulatory referral to Physical Medicine Rehab   Complete by:  As directed    Moderate complexity follow-up 1 to 2 weeks left insular posterior frontal infarction      Follow-up Information    Kirsteins, Victorino Sparrow, MD Follow up.   Specialty:  Physical Medicine and Rehabilitation Why:  office to call for appointment Contact information: [address] [place] [address] [phone]        Marinus Maw, MD Follow up.   Specialty:  Cardiology Why:  call for appointment Contact information: [address] [place] [address] [phone]        Malen Gauze, MD Follow up.   Specialty:  Urology Why:  call for appointment Contact information: [address] [place] [address] [phone]        Zetta Bills, MD Follow up.   Specialty:  Nephrology Why:  call for appointment Contact information: [address] [place] [address] [phone]           Signed: Mcarthur Rossetti Hensley Aziz 05/16/2019, 5:50 AM

## 2019-05-17 ENCOUNTER — Inpatient Hospital Stay (HOSPITAL_COMMUNITY): Payer: Medicare HMO

## 2019-05-17 NOTE — Progress Notes (Addendum)
[place] PHYSICAL MEDICINE & REHABILITATION PROGRESS NOTE   Subjective/Complaints: No RLE pain, pt took tylenol this am, discussed doppler results    ROS: Difficult to assess accuracy secondary to his aphasia   Objective:   [place]as [place] Lower Extremity Venous (dvt)  Result Date: 05/16/2019  Lower Venous Study Indications: Pain. Right calf  Performing Technologist: Toma DeitersVirginia Slaughter RVS  Examination Guidelines: A complete evaluation includes B-mode imaging, spectral Doppler, color Doppler, and power Doppler as needed of all accessible portions of each vessel. Bilateral testing is considered an integral part of a complete examination. Limited examinations for reoccurring indications may be performed as noted.  +---------+---------------+---------+-----------+----------+-------+ RIGHT    CompressibilityPhasicitySpontaneityPropertiesSummary +---------+---------------+---------+-----------+----------+-------+ CFV      Full           Yes      Yes                          +---------+---------------+---------+-----------+----------+-------+ SFJ      Full                                                 +---------+---------------+---------+-----------+----------+-------+ FV Prox  Full           Yes      Yes                          +---------+---------------+---------+-----------+----------+-------+ FV Mid   Full                                                 +---------+---------------+---------+-----------+----------+-------+ FV DistalFull           Yes      Yes                          +---------+---------------+---------+-----------+----------+-------+ PFV      Full           Yes      Yes                          +---------+---------------+---------+-----------+----------+-------+ POP      Full           Yes      Yes                          +---------+---------------+---------+-----------+----------+-------+ PTV      Full                                                  +---------+---------------+---------+-----------+----------+-------+ PERO     Full                                                 +---------+---------------+---------+-----------+----------+-------+   Right Technical Findings: Technically difficult due to constant involuntary drawing of the leg especially on compressions.  +----+---------------+---------+-----------+----------+-------+  LEFTCompressibilityPhasicitySpontaneityPropertiesSummary +----+---------------+---------+-----------+----------+-------+ CFV Full           Yes      Yes                          +----+---------------+---------+-----------+----------+-------+ SFJ Full                                                 +----+---------------+---------+-----------+----------+-------+     Summary: Right: There is no evidence of deep vein thrombosis in the lower extremity. No cystic structure found in the popliteal fossa. Left: There is no evidence of a common femoral vein obstruction.  *See table(s) above for measurements and observations.    Preliminary    Recent Labs    05/16/19 0508  WBC 8.9  HGB 11.2*  HCT 37.4*  PLT 177   Recent Labs    05/16/19 0508  NA 134*  K 5.8*  CL 106  CO2 16*  GLUCOSE 93  BUN 106*  CREATININE 4.13*  CALCIUM 10.0    Intake/Output Summary (Last 24 hours) at 05/17/2019 0930 Last data filed at 05/17/2019 0850 Gross per 24 hour  Intake 360 ml  Output 475 ml  Net -115 ml     Physical Exam: Vital Signs Blood pressure (!) 153/42, pulse (!) 50, temperature 98 F (36.7 C), temperature source Oral, resp. rate 16, height 5\' 4"  (1.626 m), weight 79.3 kg, SpO2 100 %. Constitutional: No distress . Vital signs reviewed. HENT: Normocephalic.  Atraumatic. Eyes: EOMI. No discharge. Cardiovascular: No JVD. Respiratory: Normal effort. GI: Non-distended. Musc: No edema or tenderness in extremities. Neurologic: Alert Motor:  Right 3-/5 HF, KE, 2-/5 RIght foot  cool  Expressive aphasia, nods y/n  Assessment/Plan: 1. 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  Stable for D/C today F/u PCP in 3-4 weeks F/u PM&R 2 weeks See D/C summary See D/C instructions Care Tool:  Bathing    Body parts bathed by patient: Right arm, Chest, Abdomen, Front perineal area, Left upper leg, Right upper leg, Face   Body parts bathed by helper: Buttocks, Left arm, Left lower leg, Right lower leg Body parts n/a: Right lower leg, Left lower leg   Bathing assist Assist Level: Moderate Assistance - Patient 50 - 74%     Upper Body Dressing/Undressing Upper body dressing   What is the patient wearing?: Pull over shirt    Upper body assist Assist Level: Minimal Assistance - Patient > 75%    Lower Body Dressing/Undressing Lower body dressing      What is the patient wearing?: Pants, Incontinence brief     Lower body assist Assist for lower body dressing: Maximal Assistance - Patient 25 - 49%     Toileting Toileting    Toileting assist Assist for toileting: Maximal Assistance - Patient 25 - 49%     Transfers Chair/bed transfer  Transfers assist     Chair/bed transfer assist level: Minimal Assistance - Patient > 75%(slideboard)     Locomotion Ambulation   Ambulation assist   Ambulation activity did not occur: Safety/medical concerns  Assist level: 2 helpers Assistive device: Other (comment)(3 muskateer) Max distance: 10 ft   Walk 10 feet activity   Assist  Walk 10 feet activity did not occur: Safety/medical concerns  Assist level: 2 helpers Assistive device: Other (comment)(3  muskateer)   Walk 50 feet activity   Assist Walk 50 feet with 2 turns activity did not occur: Safety/medical concerns         Walk 150 feet activity   Assist Walk 150 feet activity did not occur: Safety/medical concerns         Walk 10 feet on uneven surface  activity   Assist Walk 10 feet on uneven  surfaces activity did not occur: Safety/medical concerns         Wheelchair     Assist Will patient use wheelchair at discharge?: Yes Type of Wheelchair: Manual    Wheelchair assist level: Supervision/Verbal cueing Max wheelchair distance: 50 ft    Wheelchair 50 feet with 2 turns activity    Assist        Assist Level: Supervision/Verbal cueing   Wheelchair 150 feet activity     Assist Wheelchair 150 feet activity did not occur: Safety/medical concerns   Assist Level: Dependent - Patient 0%    Medical Problem List and Plan: 1.Right hemiparesis and aphasia/dysphagiasecondary to left insular and posterior frontal infarct embolic secondary to non-STEMI. Status post loop recorder  Stable for D/C  Weekend notes reviewed-stable 2. Antithrombotics: -DVT/anticoagulation:Subcutaneous heparin. -antiplatelet therapy: Aspirin 325 mg daily change to non enteric coated, Plavix 75 mg daily 3. Pain Management:Tylenol as needed, right Achilles pain improved with PR AFO use   Tizanidine increased to 4 mg 3 times daily on 5/25 4. Mood:Provide emotional support -antipsychotic agents: N/A 5. Neuropsych: This patientisnot fully capable of making decisions on hisown behalf. 6. Skin/Wound Care:Routine skin checks 7. Fluids/Electrolytes/Nutrition:Routine in and outs  Hyperkalemia, improved on V[place], this has now been discontinued and potassium up give kaexalate , may need to reconsult nephro if this persists8.Dysphagia. Advanced to regular diet  Monitor for aspiration    Off IVF currently 9.Acute on chronic kidney disease stage III/hyperkalemia with obstructive uropathy/BPH. Follow-up urology services as well as nephrology services. -FOLEY TUBE TO REMAIN IN PLACE UNTIL FOLLOW UP OUT PATIENT WITH UROLOGY  Creatinine 4.13 on 5/16, labs reviewed  10.Hyperlipidemia. Lipitor 11.Hypertension. Lopressor 25 mg twice daily.  Monitor with increased mobility Vitals:   05/16/19 1930 05/17/19 0451  BP: (!) 135/41 (!) 153/42  Pulse: 64 (!) 50  Resp: 16 16  Temp: 98 F (36.7 C) 98 F (36.7 C)  SpO2: 99% 100%   Relatively controlled on 5/26 12.OSA. Continue CPAP 13.  Acute blood loss anemia  Hemoglobin 10.2 on 5/5, hgb improved   Continue to monitor 14.  Constipation cahnge colace to pill form 15.  Severe spasticity , s/p dysport injection 5/14 to R biceps, Hamstrings, and add longus  Improving , added tizanidine, dose increased on 5/5  LOS: 29 days A FACE TO FACE EVALUATION WAS PERFORMED  Erick Colace 05/17/2019, 9:30 AM

## 2019-05-17 NOTE — Progress Notes (Signed)
Physical Therapy Session Note  Patient Details  Name: Gregory [name] MRN: [id number] Date of Birth: 27-Mar-1951  Today's Date: 05/17/2019 PT Individual Time: 1132-1158 PT Individual Time Calculation (min): 26 min   Short Term Goals: Week 4:  PT Short Term Goal 1 (Week 4): STG=LTG due to ELOS  Skilled Therapeutic Interventions/Progress Updates:    Pt supine in bed upon PT arrival, agreeable to therapy tx and denies pain. Pt transferred to sitting EOB with min assist and performed slideboard transfer to w/c with min assist. Therapist donned pt's shoes total assist. Pt transported to [place] tower for d/c. Pt performed car transfer this session from w/c>car with mod assist and using slideboard. Pt left in care of family for d/c.   Therapy Documentation Precautions:  Precautions Precautions: Fall Precaution Comments: Expressive aphasia, R hemiparesis Restrictions Weight Bearing Restrictions: No   Therapy/Group: Individual Therapy  Cresenciano Genre, PT, DPT 05/17/2019, 7:56 AM

## 2019-05-19 ENCOUNTER — Telehealth: Payer: Self-pay | Admitting: *Deleted

## 2019-05-19 NOTE — Progress Notes (Signed)
Carelink Summary Report / Loop Recorder 

## 2019-05-19 NOTE — Telephone Encounter (Signed)
Transitional Care call completed, appointment confirmed, address confirmed, new patient packet sent  Transitional Care Questions   Questions for our staff to ask patients on Transitional care 48 hour phone call:   1. Are you/is patient experiencing any problems since coming home? Patient is experiencing right side spasmodic activity, day and night.  Patient has a sore on his right heel, result of boot wore inpatient Are there any questions regarding any aspect of care? No  2. Are there any questions regarding medications administration/dosing? No Are meds being taken as prescribed? Yes Patient should review meds with caller to confirm   3. Have there been any falls? No  4. Has Home Health been to the house and/or have they contacted you? Yes, nurse visit today If not, have you tried to contact them? Can we help you contact them?   5. Are bowels and bladder emptying properly?  Yes Are there any unexpected incontinence issues? If applicable, is patient following bowel/bladder programs?   6. Any fevers No, problems with breathing Anxiety , unexpected pain?  Muscle tightness, spasticity  7. Are there any skin problems or new areas of breakdown? Sore on right heel  8. Has the patient/family member arranged specialty MD follow up (ie cardiology/neurology/renal/surgical/etc)?Yes Can we help arrange?   9. Does the patient need any other services or support that we can help arrange? No   10. Are caregivers following through as expected in assisting the patient? Yes   11. Has the patient quit smoking, drinking alcohol, or using drugs as recommended? Patient does not do these things

## 2019-05-22 ENCOUNTER — Telehealth: Payer: Self-pay

## 2019-05-22 NOTE — Telephone Encounter (Signed)
Jae Dire, PT from [facility] called for verbal orders for HHPT 2wk4. Orders approved and given.

## 2019-05-23 ENCOUNTER — Telehealth: Payer: Self-pay | Admitting: Physical Medicine & Rehabilitation

## 2019-05-23 NOTE — Telephone Encounter (Signed)
Per Harriett Sine at [facility] [phone] she saw Revan today and is requesting extension of )T 2 x a week x 4 weeks to work on ADL and rehab of arm  She also is requesting a social work visit to help family understand and assist in community resources. Please advise.  all at [phone] 02:44pm

## 2019-05-23 NOTE — Telephone Encounter (Signed)
OK to extend [facility], and get HH SW eval

## 2019-05-24 NOTE — Telephone Encounter (Signed)
Harriett Sine OT notified of approval.

## 2019-05-25 ENCOUNTER — Telehealth: Payer: Self-pay | Admitting: *Deleted

## 2019-05-25 ENCOUNTER — Telehealth: Payer: Self-pay

## 2019-05-25 NOTE — Telephone Encounter (Signed)
Erin, ST called requesting verbal orders for HHST 1wk1, 2wk4. Orders approved and given

## 2019-05-25 NOTE — Telephone Encounter (Signed)
Gregory [name] called to let us know that Gregory [name] is declining [facility] at this time.

## 2019-05-26 ENCOUNTER — Other Ambulatory Visit: Payer: Self-pay | Admitting: *Deleted

## 2019-05-26 MED ORDER — TIZANIDINE HCL 4 MG PO TABS: ORAL_TABLET | ORAL | 0 refills | Status: AC

## 2019-05-30 ENCOUNTER — Encounter: Payer: Self-pay | Admitting: Registered Nurse

## 2019-05-30 ENCOUNTER — Other Ambulatory Visit: Payer: Self-pay

## 2019-05-30 ENCOUNTER — Telehealth: Payer: Self-pay

## 2019-05-30 ENCOUNTER — Encounter: Payer: Medicare HMO | Attending: Registered Nurse | Admitting: Registered Nurse

## 2019-05-30 VITALS — BP 143/66 | HR 108

## 2019-05-30 DIAGNOSIS — N320 Bladder-neck obstruction: Secondary | ICD-10-CM | POA: Diagnosis present

## 2019-05-30 DIAGNOSIS — R4701 Aphasia: Secondary | ICD-10-CM | POA: Insufficient documentation

## 2019-05-30 DIAGNOSIS — N183 Chronic kidney disease, stage 3 unspecified: Secondary | ICD-10-CM

## 2019-05-30 DIAGNOSIS — R41844 Frontal lobe and executive function deficit: Secondary | ICD-10-CM | POA: Diagnosis not present

## 2019-05-30 DIAGNOSIS — G8110 Spastic hemiplegia affecting unspecified side: Secondary | ICD-10-CM | POA: Diagnosis not present

## 2019-05-30 DIAGNOSIS — I69391 Dysphagia following cerebral infarction: Secondary | ICD-10-CM | POA: Insufficient documentation

## 2019-05-30 DIAGNOSIS — I10 Essential (primary) hypertension: Secondary | ICD-10-CM | POA: Diagnosis present

## 2019-05-30 DIAGNOSIS — E785 Hyperlipidemia, unspecified: Secondary | ICD-10-CM | POA: Diagnosis present

## 2019-05-30 NOTE — Telephone Encounter (Signed)
Left message regarding appt 05/31/19.

## 2019-05-30 NOTE — Progress Notes (Signed)
Subjective:    Patient ID: Gregory [name], male    DOB: 28-Jan-1951, 68 y.o.   MRN: [id number]  HPI: Gregory [name] is a 68 y.o. male who is here for transitional care visit in follow up of his frontal lobe deficit, spastic hemiparesis aphasia, dysphagia- post- stroke,hypertension, dyslipidemia, bladder outlet obstruction and CKD Stage III.   Gregory [name] presented to [facility] on on 04/05/2019 sudden onset right sided weakness and inability to talk, he was found unresponsive on the floor, unable to talk, unable to move right side. 911 was call, and transferred to [facility] Emergency Room. Blood work revealed creatine 10.8, BUN 158 and potassium 6.8. Neurology and Nephrology was consulted.   CT Head Code Stroke WO Contrast:  IMPRESSION: 1. Possible early gray-white differentiation loss in the insula on the left. Chronic small-vessel ischemic changes. 2. One could question slight hyperdensity in the left M1 segment.  MR Brain WO Contrast/ MRA Neck WO Contrast IMPRESSION: 3.5 cm region of acute infarction affecting the left insula and posterior frontal region. No evidence of gross hemorrhage.  MR angiography of the neck markedly degraded by motion. Both internal carotid arteries show antegrade flow. No antegrade flow in the right vertebral artery.  Intracranial MR angiography does not show any large or medium vessel occlusion. Widespread atherosclerotic irregularity of the more distal branch vessels.  Neurology Consulted  He was maintainesd on aspirin and Plavix.   Renal Ulrasound:  IMPRESSION: Bilateral hydronephrosis. If there is concern for distal obstruction, CT may be indicated.  Left-sided renal cysts.  CT renal Stone Study:  IMPRESSION: Moderate bilateral hydroureteronephrosis is noted without evidence of obstructing calculus. Diffuse wall thickening of urinary bladder is noted which may be due to lack of distension, but is concerning for cystitis.  Clinical correlation is recommended. Catheter is noted within urinary bladder. Foley catheter was placed and would remain until his [place] appointment with urology.   Aortic Atherosclerosis (ICD10-I70.0).  EKG with ST Changes: Cardiology Services Consultedfor suspect Non STEMI medical management recommended. Loop recorder was placed on 04/07/2019 by Dr. Lovena Le.   Gregory [name] was admitted to Inpatient rehabilitation on 04/10/2019 and transferred to acute care on 04/14/2019 with hyperkalemia.   Readmitted to [place] on 04/18/2019 and discharged home on 05/17/2019 with [facility]. Gregory [name] arrived in wheelchair he has expressive aphasia, and his son [place]iah reports poor appetite. Spoke with [name] and encouraged him to eat and we discussed the possibility of a feeding tube.   Gregory [name] has a right heel dressing, dressing changed black eschar noted with no drainage or odor. Site cleansed and dressing applied. Gregory [name]  ( son)reports [facility] nurse is changing dressing. This will be  reported to Dr. Letta Pate, he verbalizes understanding.   Gregory [name], reporting pain ( muscle Spasticity) in his right lower extremity. [place] son asked about his tramadol.  Dr. Letta Pate hospital note was reviewed, Dr. Letta Pate hospital note recommended tylenol for pain management. This will be discussed with Dr. Letta Pate and this provider will call Gregory [name] son with update, he verbalizes understanding.    He  states his pain is located in his right lower extremity ( muscle spasticity). He rates his pain 10.    [name] arrived in wheelchair.     Pain Inventory Average Pain 10 Pain Right Now 10 My pain is sharp, stabbing and aching  In the last 24 hours, has pain interfered  with the following? General activity 0 Relation with others 0 Enjoyment of life 0 What TIME of day is your pain at its worst? all Sleep (in general) Poor  Pain is worse with:  some activites Pain improves with: rest and medication Relief from Meds: 1  Mobility ability to climb steps?  no do you drive?  no use a wheelchair needs help with transfers Do you have any goals in this area?  yes  Function Do you have any goals in this area?  no  Neuro/Psych spasms depression anxiety  Prior Studies transitional  Physicians involved in your care transitional   No family history on file. Social History   Socioeconomic History  . Marital status: Divorced    Spouse name: Not on file  . Number of children: Not on file  . Years of education: Not on file  . Highest education level: Not on file  Occupational History  . Not on file  Social Needs  . Financial resource strain: Not on file  . Food insecurity:    Worry: Not on file    Inability: Not on file  . Transportation needs:    Medical: Not on file    Non-medical: Not on file  Tobacco Use  . Smoking status: Unknown If Ever Smoked  . Smokeless tobacco: Never Used  Substance and Sexual Activity  . Alcohol use: Never    Frequency: Never  . Drug use: Never  . Sexual activity: Not on file  Lifestyle  . Physical activity:    Days per week: Not on file    Minutes per session: Not on file  . Stress: Not on file  Relationships  . Social connections:    Talks on phone: Not on file    Gets together: Not on file    Attends religious service: Not on file    Active member of club or organization: Not on file    Attends meetings of clubs or organizations: Not on file    Relationship status: Not on file  Other Topics Concern  . Not on file  Social History Narrative  . Not on file   Past Surgical History:  Procedure Laterality Date  . AORTIC ARCH ANGIOGRAPHY N/A 09/09/2018   Procedure: AORTIC ARCH ANGIOGRAPHY;  Surgeon: Sherren KernsFields, Charles E, MD;  Location: [facility] INVASIVE CV LAB;  Service: Cardiovascular;  Laterality: N/A;  . CAROTID ANGIOGRAPHY Bilateral 09/09/2018   Procedure: CAROTID ANGIOGRAPHY;   Surgeon: Sherren KernsFields, Charles E, MD;  Location: [facility] INVASIVE CV LAB;  Service: Cardiovascular;  Laterality: Bilateral;  . LOOP RECORDER INSERTION N/A 04/07/2019   Procedure: LOOP RECORDER INSERTION;  Surgeon: Marinus Mawaylor, Gregg W, MD;  [facility];  Service: Cardiovascular;  Laterality: N/A;   Past Medical History:  Diagnosis Date  . AKI (acute kidney injury) (HCC)   . CKD (chronic kidney disease) stage 2, GFR 60-89 ml/min   . CVA (cerebral vascular accident) (HCC)   . Hyperlipidemia   . Hypertension   . PVD (peripheral vascular disease) (HCC)    BP (!) 143/66   Pulse (!) 108   SpO2 97%   Opioid Risk Score:   Fall Risk Score:  `1  Depression screen PHQ 2/9  Depression screen PHQ 2/9 05/30/2019  Decreased Interest 0  PHQ - 2 Score 0    Review of Systems  Constitutional: Negative.   HENT: Negative.   Eyes: Negative.   Respiratory: Negative.   Cardiovascular: Negative.   Gastrointestinal: Negative.   Endocrine: Negative.  Genitourinary: Negative.   Musculoskeletal: Positive for gait problem.       Spasms   Skin: Negative.   Allergic/Immunologic: Negative.   Neurological: Positive for speech difficulty.  Hematological: Negative.   Psychiatric/Behavioral: Positive for dysphoric mood. The patient is nervous/anxious.   All other systems reviewed and are negative.      Objective:   Physical Exam Vitals signs and nursing note reviewed.  Constitutional:      Appearance: Normal appearance.  Neck:     Musculoskeletal: Normal range of motion and neck supple.  Cardiovascular:     Rate and Rhythm: Normal rate and regular rhythm.     Pulses: Normal pulses.     Heart sounds: Normal heart sounds.  Pulmonary:     Effort: Pulmonary effort is normal.     Breath sounds: Normal breath sounds.  Genitourinary:    Comments: Foley Intact Draining Clear Yellow Urine Musculoskeletal:     Comments: Normal Muscle Bulk and Muscle Testing Reveals:  Upper Extremities: Right:  Decreased ROM 45 Degrees and Muscle Strength 0/5 Left: Full ROM and Muscle Strength 4/5 Lower Extremities: Right: Decreased ROM  And Muscle Strength 3/5  Left: Full ROM and Muscle Strength 4/5 Arrived in wheelchair  Neurological:     Mental Status: He is alert.     Comments: Oriented X1  Psychiatric:        Mood and Affect: Mood normal.        Behavior: Behavior normal.           Assessment & Plan:  1.Frontal Lobe Deficit/ Spastic Hemiparesis/ Aphasia/ Dysphagia,post- stroke: Continue Outpatient Home Therapy with [facility]. Also instructed to [facility], son verbalizes understanding.  2. Essential Hypertension: Continue current medication regimen. PCP Following.  3. Dyslipidemia: Continue current medication regimen. PCP following.  4. Bladder outlet obstruction: Urology Following. Foley intact.  5. CKD Stage III: Nephrology Following.  6. NSTEMI: Cardiology Following. Loop Recorder Placed on 04/07/2019 by Dr. Ladona Ridgelaylor.   30 minutes of face to face patient care time was spent during this visit. All questions were encouraged and answered.  F/U in 4- 6 weeks with Dr. Wynn BankerKirsteins.

## 2019-05-31 ENCOUNTER — Encounter: Payer: Medicare HMO | Admitting: Nurse Practitioner

## 2019-05-31 ENCOUNTER — Telehealth: Payer: Self-pay | Admitting: Registered Nurse

## 2019-05-31 NOTE — Telephone Encounter (Signed)
Placed a call to [name] son, was in contact with Dr. Letta Pate regarding Gregory [name]  SR right foot ulcer with black eschar. Dr. Letta Pate recommended a PRAFO. Also spoke with him regarding pain management with tylenol and we won't resume Tramadol. Mr. Dacosta ( son) reports [name] is at [facility] in [place], reports his dad wouldn't eat and became weaker. He was instructed to call office when his dad is discharged and we will follow up on the [facility] at that time, he verbalizes understanding.

## 2019-06-01 DIAGNOSIS — L89610 Pressure ulcer of right heel, unstageable: Secondary | ICD-10-CM

## 2019-06-01 DIAGNOSIS — I081 Rheumatic disorders of both mitral and tricuspid valves: Secondary | ICD-10-CM

## 2019-06-01 DIAGNOSIS — M109 Gout, unspecified: Secondary | ICD-10-CM

## 2019-06-01 DIAGNOSIS — S31120D Laceration of abdominal wall with foreign body, right upper quadrant without penetration into peritoneal cavity, subsequent encounter: Secondary | ICD-10-CM

## 2019-06-01 DIAGNOSIS — I69351 Hemiplegia and hemiparesis following cerebral infarction affecting right dominant side: Secondary | ICD-10-CM

## 2019-06-01 DIAGNOSIS — D631 Anemia in chronic kidney disease: Secondary | ICD-10-CM

## 2019-06-01 DIAGNOSIS — I214 Non-ST elevation (NSTEMI) myocardial infarction: Secondary | ICD-10-CM

## 2019-06-01 DIAGNOSIS — N182 Chronic kidney disease, stage 2 (mild): Secondary | ICD-10-CM

## 2019-06-01 DIAGNOSIS — M48061 Spinal stenosis, lumbar region without neurogenic claudication: Secondary | ICD-10-CM

## 2019-06-01 DIAGNOSIS — E785 Hyperlipidemia, unspecified: Secondary | ICD-10-CM

## 2019-06-01 DIAGNOSIS — G4733 Obstructive sleep apnea (adult) (pediatric): Secondary | ICD-10-CM

## 2019-06-01 DIAGNOSIS — I639 Cerebral infarction, unspecified: Secondary | ICD-10-CM

## 2019-06-01 DIAGNOSIS — I69320 Aphasia following cerebral infarction: Secondary | ICD-10-CM | POA: Diagnosis not present

## 2019-06-01 DIAGNOSIS — R1310 Dysphagia, unspecified: Secondary | ICD-10-CM

## 2019-06-01 DIAGNOSIS — I129 Hypertensive chronic kidney disease with stage 1 through stage 4 chronic kidney disease, or unspecified chronic kidney disease: Secondary | ICD-10-CM

## 2019-06-01 DIAGNOSIS — I69391 Dysphagia following cerebral infarction: Secondary | ICD-10-CM

## 2019-06-03 ENCOUNTER — Telehealth: Payer: Self-pay | Admitting: Pulmonary Disease

## 2019-06-03 ENCOUNTER — Inpatient Hospital Stay
Admission: AD | Admit: 2019-06-03 | Payer: Medicare HMO | Source: Other Acute Inpatient Hospital | Admitting: Pulmonary Disease

## 2019-06-03 NOTE — Telephone Encounter (Signed)
[facility]   Received call from Dr. Jilda Panda? From the [facility].  Has a history of stroke.  He was admitted on 6/9 for not eating, poor mental status, he was noted to have AKI.  Worsening mental status at the [facility].  MRI brain showed small bleeding from prior stroke, not very large.  He was moved to the ICU, given IV fluids, renal function has improved.  Baclofen stopped on 6/11.  He was intubated on 6/12.  He remains on IV propofol.    I accepted the patient to my service.    Roselie Awkward, MD [place] [facility] Pager: [phone] Cell: [phone] If no response, call [phone]

## 2019-06-12 ENCOUNTER — Ambulatory Visit (INDEPENDENT_AMBULATORY_CARE_PROVIDER_SITE_OTHER): Payer: Medicare HMO | Admitting: *Deleted

## 2019-06-12 DIAGNOSIS — G459 Transient cerebral ischemic attack, unspecified: Secondary | ICD-10-CM

## 2019-06-13 LAB — CUP PACEART REMOTE DEVICE CHECK
Date Time Interrogation Session: 20200622184135
Implantable Pulse Generator Implant Date: 20200417

## 2019-06-16 ENCOUNTER — Telehealth: Payer: Self-pay

## 2019-06-16 NOTE — Telephone Encounter (Signed)
Left message for patient to remind of missed remote transmission.  

## 2019-06-21 ENCOUNTER — Telehealth: Payer: Self-pay

## 2019-06-21 NOTE — Telephone Encounter (Signed)
Started in error

## 2019-06-21 NOTE — Telephone Encounter (Signed)
Kreg Shropshire, PT, [facility] left a message asking for verbal orders to change frequency on HHPT and HHOT to [facility] for bot disciplines.  She also states that patient is not getting good response from tizanidine 4 mg tid.  I spoke tp Dr. Wynn Banker and he says he will add an additional tablet at night.  That will make it 4mg  BID plus 8mg  at bedtime.  Medical record reviewed. Social work note reviewed.  Verbal orders given per office protocol. HHPT notified of medication change.

## 2019-06-21 DEATH — deceased

## 2019-06-22 NOTE — Progress Notes (Signed)
Carelink Summary Report / Loop Recorder 

## 2019-06-27 ENCOUNTER — Encounter: Payer: Medicare HMO | Admitting: Physical Medicine & Rehabilitation

## 2020-01-12 IMAGING — CT CT ABDOMEN AND PELVIS WITHOUT CONTRAST
2 of 4 series · 16 of 46 positions shown, 18 images · non-contrast
Comparison: CT of the abdomen and pelvis without contrast on
04/06/2019 and renal ultrasound on 04/05/2019.

CLINICAL DATA: Acute kidney injury, bilateral hydronephrosis and
bladder outlet obstruction.

EXAM:
CT ABDOMEN AND PELVIS WITHOUT CONTRAST
TECHNIQUE: Multidetector CT imaging of the abdomen and pelvis was performed
following the standard protocol without IV contrast.

[Series 3: ap without · axial · non-contrast · 0.80mm/px · z∈[+868,+1263]mm · 13 of 89 slices shown, 15 images]
[im 5/89  soft-tissue]
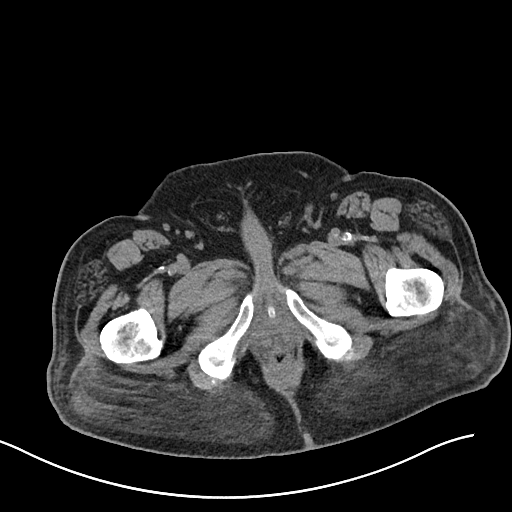
[im 5/89  bone]
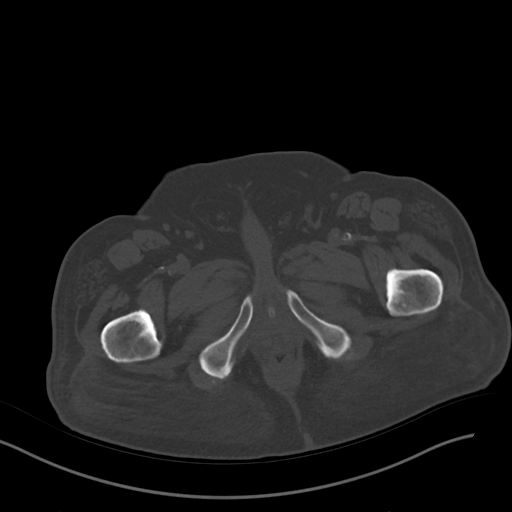
[im 14/89  soft-tissue]
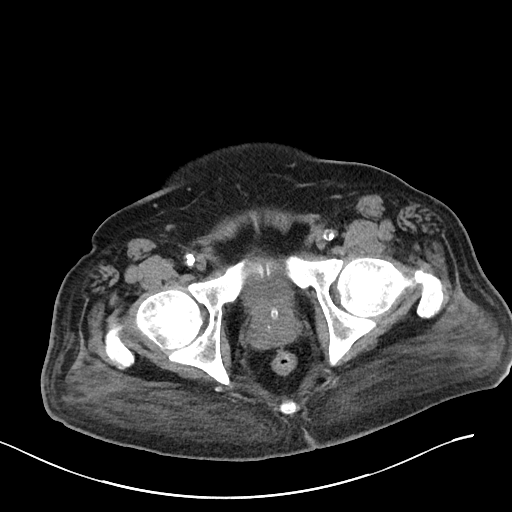
[im 19/89  soft-tissue]
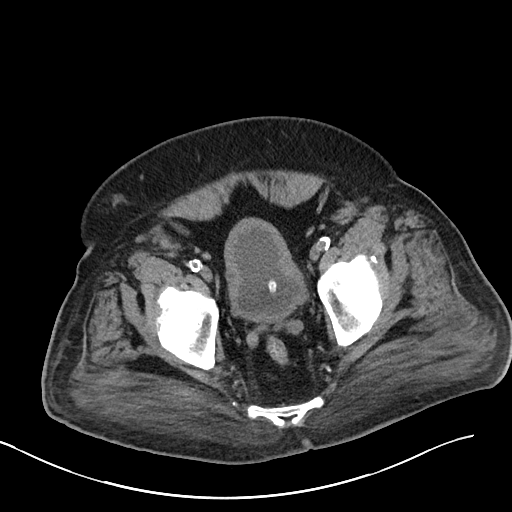
[im 24/89  soft-tissue]
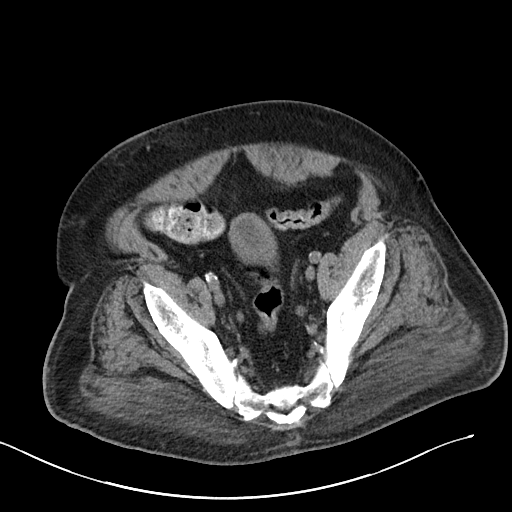
[im 33/89  soft-tissue]
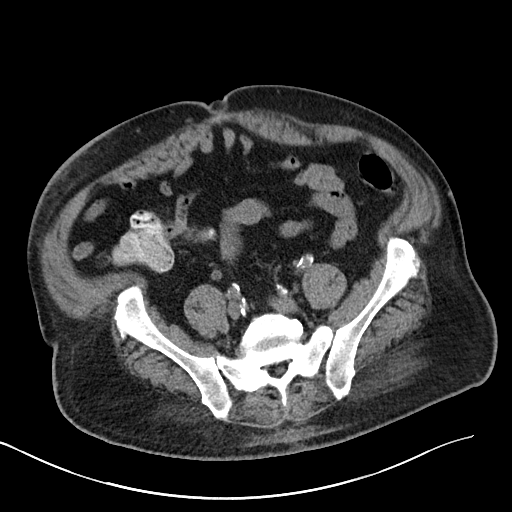
[im 38/89  soft-tissue]
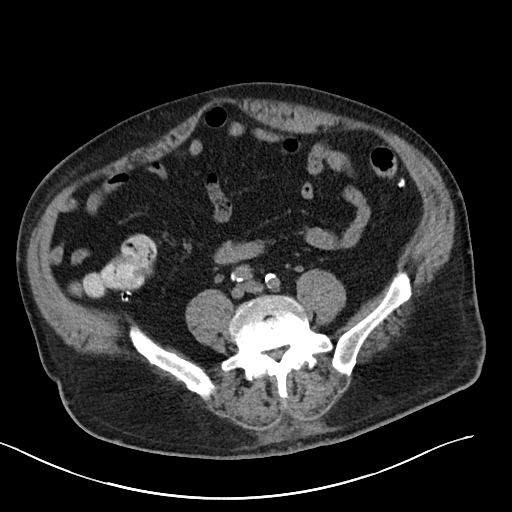
[im 47/89  soft-tissue]
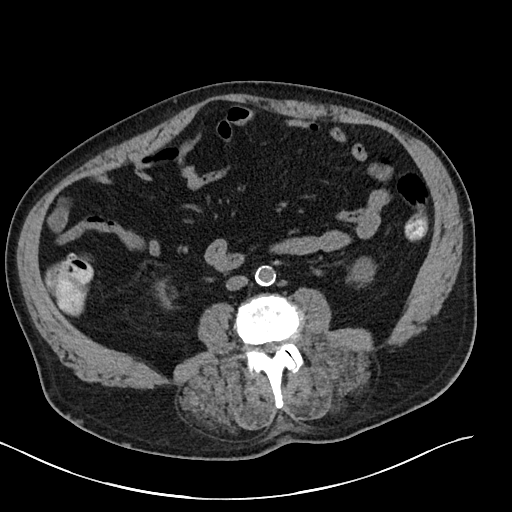
[im 51/89  soft-tissue]
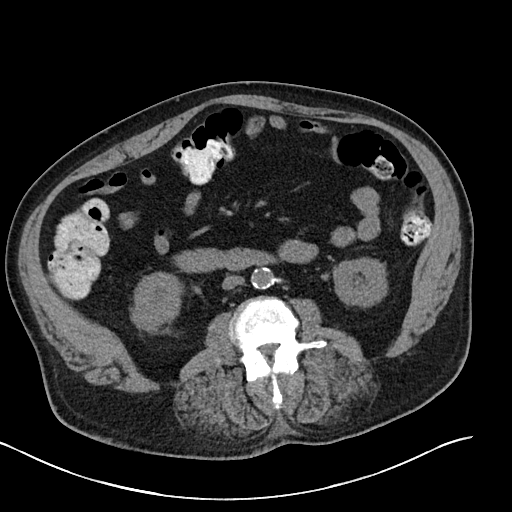
[im 56/89  soft-tissue]
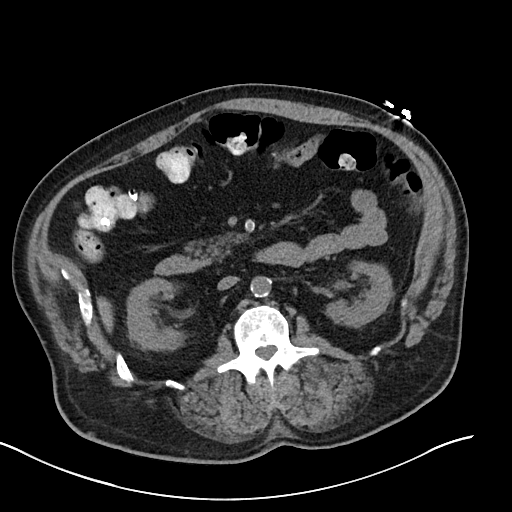
[im 56/89  bone]
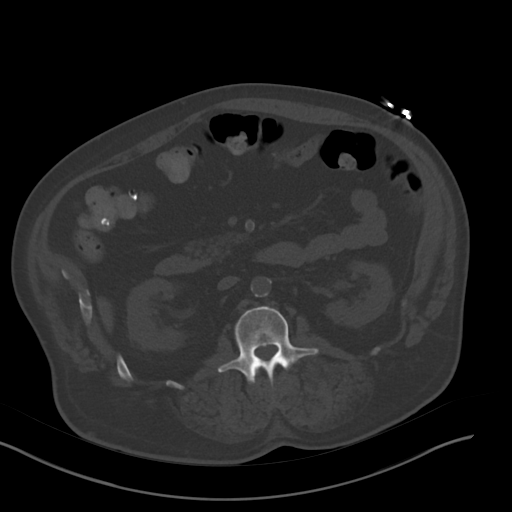
[im 65/89  soft-tissue]
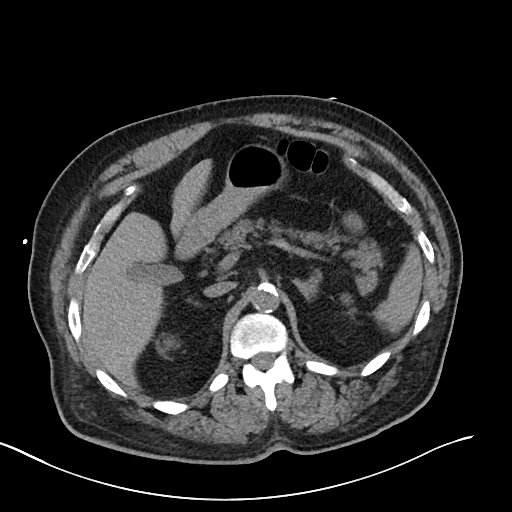
[im 70/89  soft-tissue]
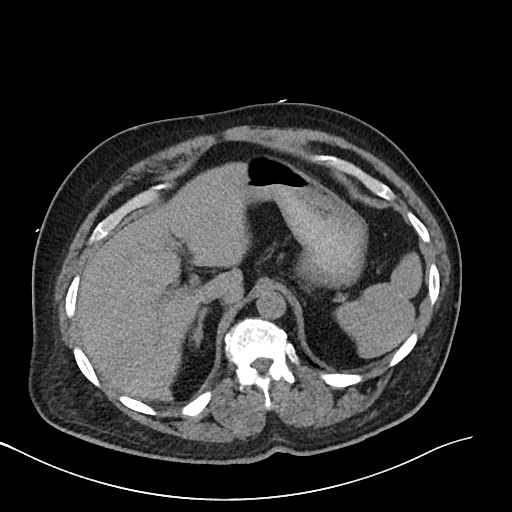
[im 75/89  soft-tissue]
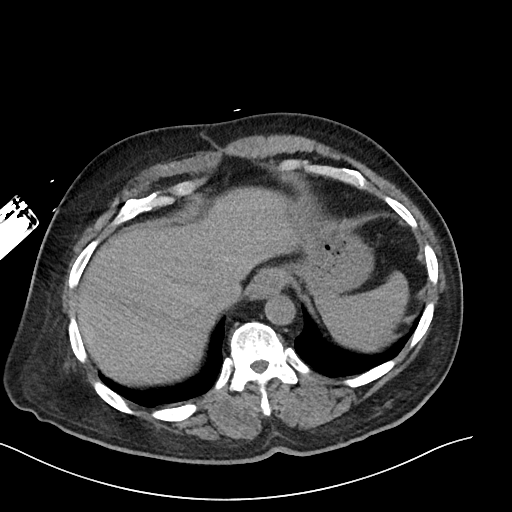
[im 84/89  soft-tissue]
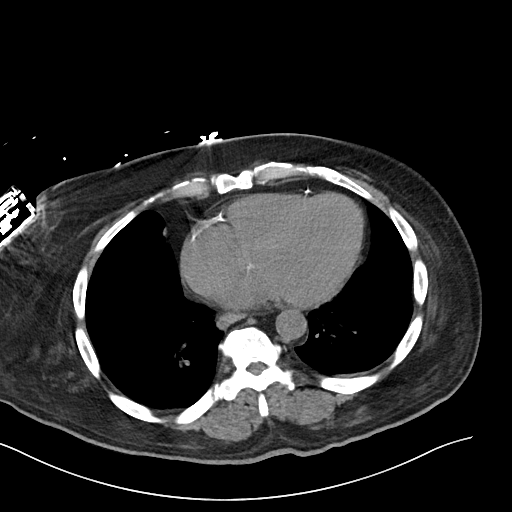

[Series 6: cor · coronal · 0.87mm/px · 3 of 123 slices shown]
[im 41/123  soft-tissue]
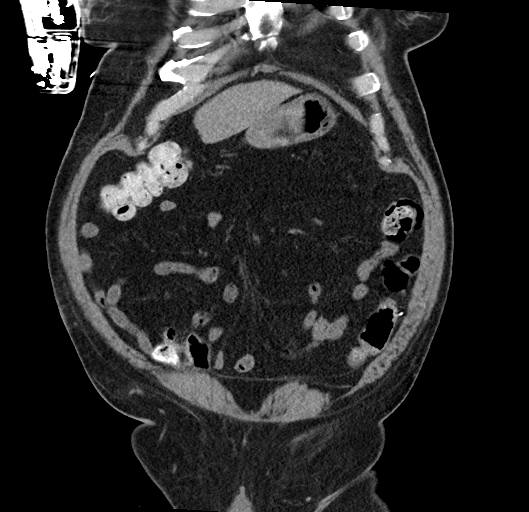
[im 55/123  soft-tissue]
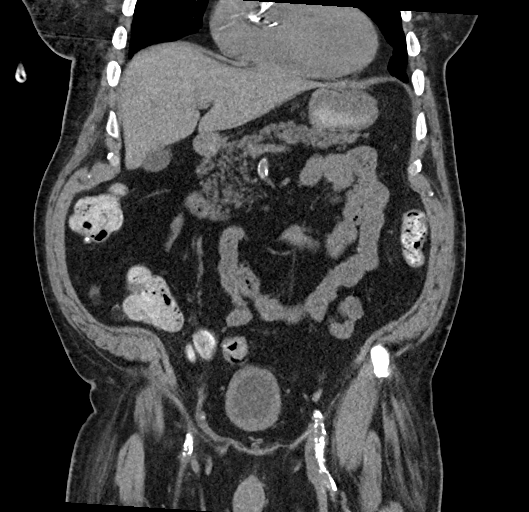
[im 68/123  soft-tissue]
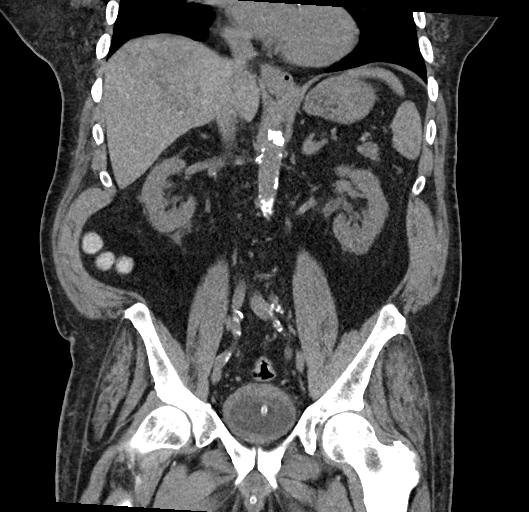

[16 of 46 positions shown; findings below may reference images not displayed]

FINDINGS: Lower chest: No acute abnormality.

Hepatobiliary: The liver is unremarkable. The gallbladder is
nondistended and likely contains some tiny dependent calculi. No
evidence of biliary ductal dilatation.

Pancreas: Unremarkable. No pancreatic ductal dilatation or
surrounding inflammatory changes.

Spleen: Normal in size without focal abnormality.

Adrenals/Urinary Tract: The kidneys demonstrate no hydronephrosis.
The ureters are nondilated. No calculi identified. The bladder is
largely decompressed by a Foley catheter. There is evidence
persistent circumferential wall thickening likely on the basis of
chronic outlet obstruction. Adrenal glands remain unremarkable
bilaterally.

Stomach/Bowel: Bowel shows no evidence of obstruction, ileus,
inflammation or lesion. No free air identified.

Vascular/Lymphatic: Calcified plaque throughout much of the
abdominal aorta, visceral arteries and iliac arteries as well as the
common femoral arteries. No evidence of aneurysmal disease. No
enlarged lymph nodes identified in the abdomen or pelvis.

Reproductive: The prostate gland appears mild to moderately
enlarged.

Other: No abdominal wall hernia or abnormality. No abdominopelvic
ascites.

Musculoskeletal: No acute or significant osseous findings.
IMPRESSION: 1. No evidence of recurrent hydronephrosis. The bladder is
decompressed by a Foley catheter and demonstrates persistent
circumferential wall thickening, likely on the basis of chronic
outlet obstruction.
2. Probable small dependent calculi in the gallbladder.

## 2020-01-22 IMAGING — CR RIGHT ANKLE - 2 VIEW
2 series · 2 of 2 positions shown · non-contrast
Comparison: None

CLINICAL DATA: Acute RIGHT ankle pain, no known injury

EXAM:
RIGHT ANKLE - 2 VIEW

[ankle ap]
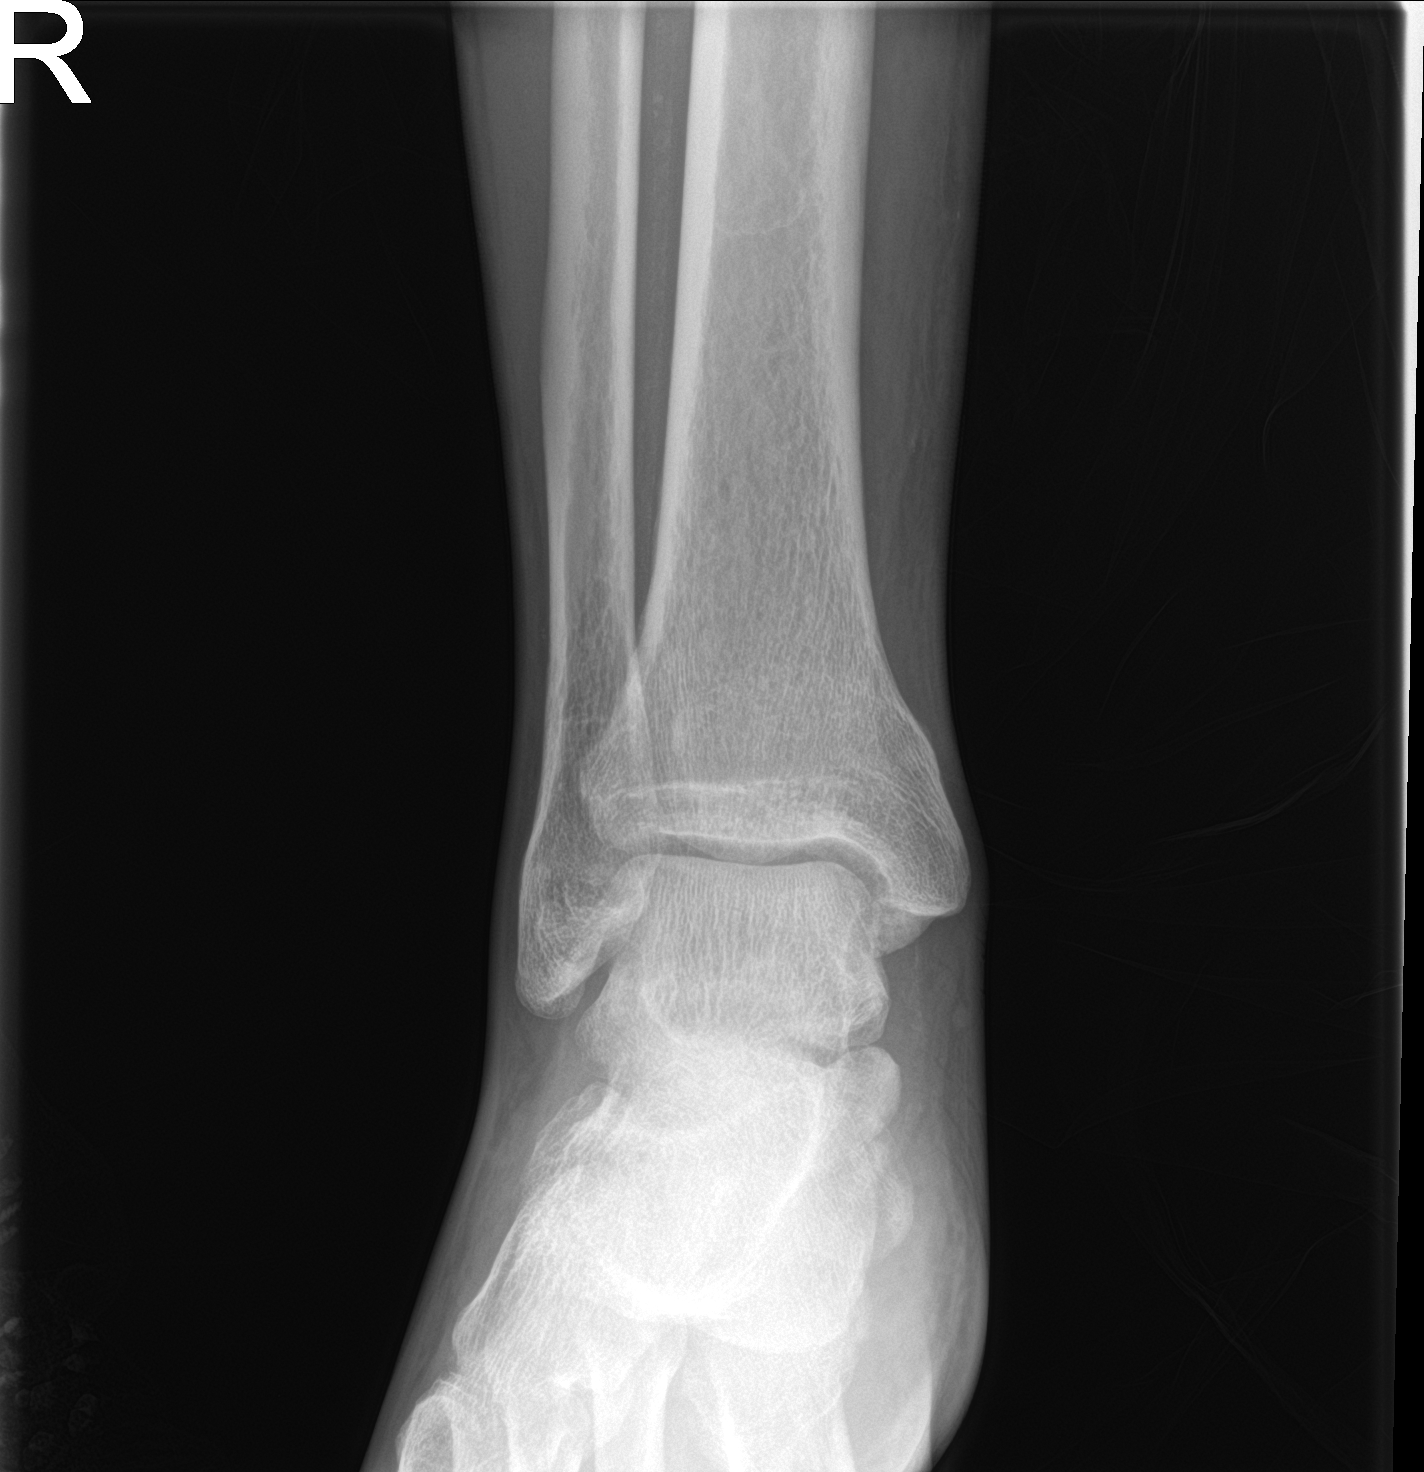

[ankle lat]
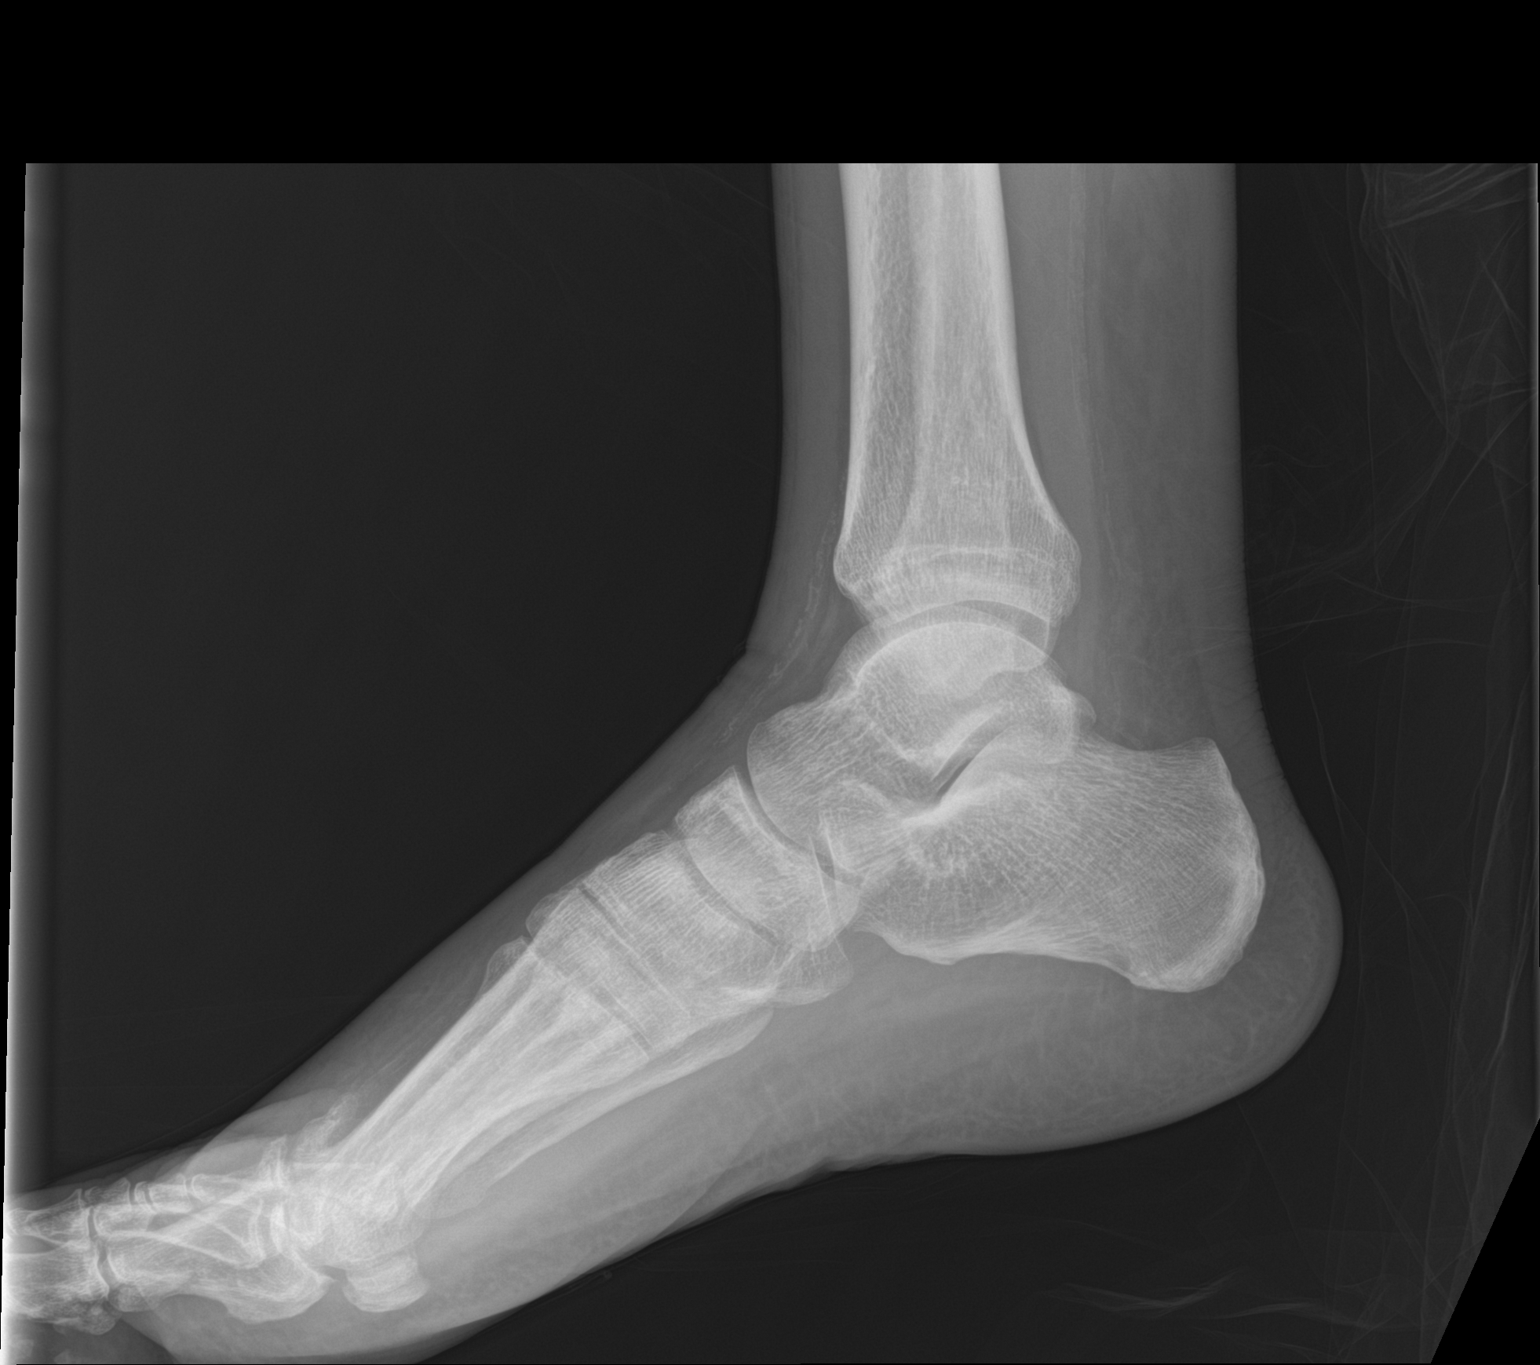

[2 of 2 positions shown; findings below may reference images not displayed]

FINDINGS: Osseous mineralization normal.

Joint spaces preserved.

No fracture, dislocation, or bone destruction.

Small vessel arterial calcifications noted at the ankle and foot.
IMPRESSION: No acute osseous abnormalities.
